# Patient Record
Sex: Female | Born: 1940
Health system: Southern US, Community
[De-identification: ages and names within clinical notes are randomized; demographics above are authoritative.]

## PROBLEM LIST (undated history)

## (undated) DIAGNOSIS — D649 Anemia, unspecified: Secondary | ICD-10-CM

## (undated) DIAGNOSIS — R0602 Shortness of breath: Secondary | ICD-10-CM

## (undated) DIAGNOSIS — M1990 Unspecified osteoarthritis, unspecified site: Secondary | ICD-10-CM

## (undated) DIAGNOSIS — I214 Non-ST elevation (NSTEMI) myocardial infarction: Secondary | ICD-10-CM

## (undated) DIAGNOSIS — I509 Heart failure, unspecified: Secondary | ICD-10-CM

## (undated) DIAGNOSIS — K029 Dental caries, unspecified: Secondary | ICD-10-CM

## (undated) DIAGNOSIS — I255 Ischemic cardiomyopathy: Secondary | ICD-10-CM

## (undated) DIAGNOSIS — I10 Essential (primary) hypertension: Secondary | ICD-10-CM

## (undated) DIAGNOSIS — I2510 Atherosclerotic heart disease of native coronary artery without angina pectoris: Secondary | ICD-10-CM

## (undated) HISTORY — PX: CARDIAC CATHETERIZATION: SHX172

---

## 2012-09-07 ENCOUNTER — Inpatient Hospital Stay (HOSPITAL_COMMUNITY)
Admission: EM | Admit: 2012-09-07 | Discharge: 2012-09-11 | DRG: 280 | Disposition: A | Discharge status: Home / Self Care | Payer: Medicare Other | Attending: Family Medicine | Admitting: Family Medicine

## 2012-09-07 ENCOUNTER — Emergency Department (HOSPITAL_COMMUNITY): Payer: Medicare Other

## 2012-09-07 ENCOUNTER — Emergency Department (INDEPENDENT_AMBULATORY_CARE_PROVIDER_SITE_OTHER)
Admission: EM | Admit: 2012-09-07 | Discharge: 2012-09-07 | Disposition: A | Discharge status: Nursing Home (Includes ICF) | Payer: Medicare Other | Source: Home / Self Care | Attending: Emergency Medicine | Admitting: Emergency Medicine

## 2012-09-07 ENCOUNTER — Encounter (HOSPITAL_COMMUNITY): Payer: Self-pay | Admitting: *Deleted

## 2012-09-07 DIAGNOSIS — R0789 Other chest pain: Secondary | ICD-10-CM

## 2012-09-07 DIAGNOSIS — R0602 Shortness of breath: Secondary | ICD-10-CM | POA: Diagnosis present

## 2012-09-07 DIAGNOSIS — F17200 Nicotine dependence, unspecified, uncomplicated: Secondary | ICD-10-CM | POA: Diagnosis present

## 2012-09-07 DIAGNOSIS — I2589 Other forms of chronic ischemic heart disease: Secondary | ICD-10-CM | POA: Diagnosis present

## 2012-09-07 DIAGNOSIS — R079 Chest pain, unspecified: Secondary | ICD-10-CM

## 2012-09-07 DIAGNOSIS — I259 Chronic ischemic heart disease, unspecified: Secondary | ICD-10-CM

## 2012-09-07 DIAGNOSIS — I5031 Acute diastolic (congestive) heart failure: Secondary | ICD-10-CM | POA: Diagnosis present

## 2012-09-07 DIAGNOSIS — R9431 Abnormal electrocardiogram [ECG] [EKG]: Secondary | ICD-10-CM | POA: Diagnosis present

## 2012-09-07 DIAGNOSIS — J811 Chronic pulmonary edema: Secondary | ICD-10-CM

## 2012-09-07 DIAGNOSIS — D649 Anemia, unspecified: Secondary | ICD-10-CM | POA: Diagnosis present

## 2012-09-07 DIAGNOSIS — I2510 Atherosclerotic heart disease of native coronary artery without angina pectoris: Secondary | ICD-10-CM

## 2012-09-07 DIAGNOSIS — I509 Heart failure, unspecified: Secondary | ICD-10-CM | POA: Diagnosis present

## 2012-09-07 DIAGNOSIS — I214 Non-ST elevation (NSTEMI) myocardial infarction: Principal | ICD-10-CM

## 2012-09-07 DIAGNOSIS — I5021 Acute systolic (congestive) heart failure: Secondary | ICD-10-CM

## 2012-09-07 HISTORY — DX: Anemia, unspecified: D64.9

## 2012-09-07 HISTORY — DX: Heart failure, unspecified: I50.9

## 2012-09-07 HISTORY — DX: Shortness of breath: R06.02

## 2012-09-07 HISTORY — DX: Non-ST elevation (NSTEMI) myocardial infarction: I21.4

## 2012-09-07 HISTORY — DX: Atherosclerotic heart disease of native coronary artery without angina pectoris: I25.10

## 2012-09-07 HISTORY — DX: Chest pain, unspecified: R07.9

## 2012-09-07 LAB — CBC WITH DIFFERENTIAL/PLATELET
Basophils Relative: 0 % (ref 0–1)
Eosinophils Absolute: 0.1 10*3/uL (ref 0.0–0.7)
Hemoglobin: 10.7 g/dL — ABNORMAL LOW (ref 12.0–15.0)
MCH: 27.6 pg (ref 26.0–34.0)
MCHC: 32.7 g/dL (ref 30.0–36.0)
Monocytes Relative: 7 % (ref 3–12)
Neutrophils Relative %: 42 % — ABNORMAL LOW (ref 43–77)

## 2012-09-07 LAB — BASIC METABOLIC PANEL
BUN: 13 mg/dL (ref 6–23)
Calcium: 9.3 mg/dL (ref 8.4–10.5)
Creatinine, Ser: 1.03 mg/dL (ref 0.50–1.10)
GFR calc Af Amer: 62 mL/min — ABNORMAL LOW (ref >90)
GFR calc non Af Amer: 54 mL/min — ABNORMAL LOW (ref >90)
Potassium: 5.3 mEq/L — ABNORMAL HIGH (ref 3.5–5.1)

## 2012-09-07 LAB — POCT I-STAT TROPONIN I

## 2012-09-07 LAB — RETICULOCYTES
RBC.: 4.02 MIL/uL (ref 3.87–5.11)
Retic Ct Pct: 1.4 % (ref 0.4–3.1)

## 2012-09-07 MED ORDER — ASPIRIN 81 MG PO CHEW
CHEWABLE_TABLET | ORAL | Status: AC
  Filled 2012-09-07: qty 4

## 2012-09-07 MED ORDER — NITROGLYCERIN 0.4 MG SL SUBL
0.4000 mg | SUBLINGUAL_TABLET | SUBLINGUAL | Status: AC | PRN
  Administered 2012-09-07: 0.4 mg via SUBLINGUAL

## 2012-09-07 MED ORDER — SODIUM CHLORIDE 0.9 % IV SOLN
INTRAVENOUS | Status: DC
  Administered 2012-09-07: 18:00:00 via INTRAVENOUS

## 2012-09-07 MED ORDER — FUROSEMIDE 10 MG/ML IJ SOLN
60.0000 mg | Freq: Once | INTRAMUSCULAR | Status: AC
  Administered 2012-09-07: 60 mg via INTRAVENOUS
  Filled 2012-09-07: qty 6

## 2012-09-07 MED ORDER — NITROGLYCERIN 0.4 MG SL SUBL
SUBLINGUAL_TABLET | SUBLINGUAL | Status: AC
  Filled 2012-09-07: qty 25

## 2012-09-07 MED ORDER — NITROGLYCERIN 2 % TD OINT
0.5000 [in_us] | TOPICAL_OINTMENT | Freq: Once | TRANSDERMAL | Status: AC
  Administered 2012-09-07: 0.5 [in_us] via TOPICAL
  Filled 2012-09-07: qty 1

## 2012-09-07 MED ORDER — ASPIRIN 81 MG PO CHEW
324.0000 mg | CHEWABLE_TABLET | Freq: Once | ORAL | Status: AC
  Administered 2012-09-07: 324 mg via ORAL

## 2012-09-07 MED ORDER — MORPHINE SULFATE 4 MG/ML IJ SOLN: 4.0000 mg | Freq: Once | INTRAMUSCULAR | Status: DC

## 2012-09-07 NOTE — ED Notes (Signed)
Pt placed in room and on monitor. Pt c/o shortness of breath. Per pt request 2 L [place] applied. Pt denies chest pain at this time. Pt alert and oriented. No distress noted,

## 2012-09-07 NOTE — ED Notes (Signed)
Pt ambulated to restroom without assistance. Steady gait noted.  

## 2012-09-07 NOTE — ED Provider Notes (Signed)
History     CSN: [id number]  Arrival date & time 09/07/12  4098   First MD Initiated Contact with Patient 09/07/12 1847      Chief Complaint  Patient presents with  . Palpitations  . Shortness of Breath    (Consider location/radiation/quality/duration/timing/severity/associated sxs/prior treatment) HPI PT states she is having SOB, worse with exertion and lying flat, for 1 week. States she is having mild chest tightness and has ha a non-productive cough. Seen at [facility] and given NTG x 1 and ASA and transferred for further workup. Pt states her chest tightness has improve. No fever or chills. Mild lower ext swelling  Past Medical History  Diagnosis Date  . Shortness of breath   . CHF (congestive heart failure)   . Anemia     History reviewed. No pertinent past surgical history.  History reviewed. No pertinent family history.  History  Substance Use Topics  . Smoking status: Current Every Day Smoker -- 0.5 packs/day for 20 years    Types: Cigarettes  . Smokeless tobacco: Not on file  . Alcohol Use: No    OB History    Grav Para Term Preterm Abortions TAB SAB Ect Mult Living                  Review of Systems  Constitutional: Negative for fever and chills.  Respiratory: Positive for cough, chest tightness and shortness of breath.   Cardiovascular: Positive for leg swelling. Negative for chest pain and palpitations.  Gastrointestinal: Negative for nausea, vomiting, abdominal pain and diarrhea.  Musculoskeletal: Negative for myalgias and back pain.  Skin: Negative for rash and wound.  Neurological: Negative for dizziness, weakness, light-headedness and numbness.  All other systems reviewed and are negative.    Allergies  Review of patient's allergies indicates no known allergies.  Home Medications  No current outpatient prescriptions on file.  BP 116/73  Pulse 88  Temp 98 F (36.7 C) (Oral)  Resp 18  Ht 5\' 1"  (1.549 m)  Wt 127 lb 8 oz (57.834 kg)  BMI 24.09  kg/m2  SpO2 100%  Physical Exam  Nursing note and vitals reviewed. Constitutional: She is oriented to person, place, and time. She appears well-developed and well-nourished. No distress.  HENT:  Head: Normocephalic and atraumatic.  Mouth/Throat: Oropharynx is clear and moist.  Eyes: EOM are normal. Pupils are equal, round, and reactive to light.  Neck: Normal range of motion. Neck supple.  Cardiovascular: Normal rate and regular rhythm.   Pulmonary/Chest: Effort normal. No respiratory distress. She has no wheezes. She has rales. She exhibits no tenderness.       bl rales   Abdominal: Soft. Bowel sounds are normal. There is no tenderness. There is no rebound and no guarding.  Musculoskeletal: Normal range of motion. She exhibits edema (1+ bl pitting edema). She exhibits no tenderness.  Neurological: She is alert and oriented to person, place, and time.  Skin: Skin is warm and dry. No rash noted. No erythema.  Psychiatric: She has a normal mood and affect. Her behavior is normal.    ED Course  Procedures (including critical care time)  Labs Reviewed  CBC WITH DIFFERENTIAL - Abnormal; Notable for the following:    WBC 3.8 (*)     Hemoglobin 10.7 (*)     HCT 32.7 (*)     Neutrophils Relative 42 (*)     Neutro Abs 1.6 (*)     Lymphocytes Relative 49 (*)     All  other components within normal limits  BASIC METABOLIC PANEL - Abnormal; Notable for the following:    Potassium 5.3 (*)     GFR calc non Af Amer 54 (*)     GFR calc Af Amer 62 (*)     All other components within normal limits  PRO B NATRIURETIC PEPTIDE - Abnormal; Notable for the following:    Pro B Natriuretic peptide (BNP) 4088.0 (*)     All other components within normal limits  VITAMIN B12 - Abnormal; Notable for the following:    Vitamin B-12 1086 (*)     All other components within normal limits  IRON AND TIBC - Abnormal; Notable for the following:    Saturation Ratios 19 (*)     All other components within  normal limits  BASIC METABOLIC PANEL - Abnormal; Notable for the following:    Potassium 2.9 (*)  DELTA CHECK NOTED   Glucose, Bld 108 (*)     GFR calc non Af Amer 57 (*)     GFR calc Af Amer 66 (*)     All other components within normal limits  BASIC METABOLIC PANEL - Abnormal; Notable for the following:    Glucose, Bld 105 (*)     GFR calc non Af Amer 50 (*)     GFR calc Af Amer 58 (*)     All other components within normal limits  POCT I-STAT TROPONIN I  FOLATE  FERRITIN  RETICULOCYTES  TROPONIN I  TROPONIN I  TROPONIN I  TSH   Dg Chest 2 View  09/07/2012  *RADIOLOGY REPORT*  Clinical Data: Palpitations, chest tightness, shortness of breath, cough  CHEST - 2 VIEW  Comparison: None  Findings: Enlargement cardiac silhouette. Minimal pulmonary vascular congestion. Calcified tortuous aorta. Right pleural effusion and basilar atelectasis. Perihilar interstitial infiltrates bilaterally favor edema over infection. No pneumothorax. Question osseous demineralization.  IMPRESSION: Enlargement of cardiac silhouette with pulmonary vascular congestion and diffuse bilateral interstitial infiltrates favor pulmonary edema over infection. Associated right pleural effusion and basilar atelectasis.   Original Report Authenticated By: Ulyses Southward, M.D.      1. Pulmonary edema   2. Chest tightness   3. Chest pain   4. CHF exacerbation   5. EKG abnormalities   6. Normochromic anemia   7. SOB (shortness of breath)      Date: 09/07/2012  Rate: 89  Rhythm: normal sinus rhythm  QRS Axis: normal  Intervals: QT prolonged  ST/T Wave abnormalities: nonspecific T wave changes  Conduction Disutrbances:none  Narrative Interpretation:   Old EKG Reviewed: none available    MDM   Pt states chest tightness and SOB has improved since given lasix       Loren Racer, MD 09/08/12 [phone]

## 2012-09-07 NOTE — ED Notes (Signed)
Patient complains of shortness of breath x 1 week. Patient states she can't lie down to sleep because she can't breath. States she has had some chills and chest congestion with tightness in chest. Denies fever, nausea, vomiting, diarrhea.

## 2012-09-07 NOTE — ED Notes (Addendum)
Pt states she has used the restroom 3 times and after this last time she feels a lot of relief of that chest pressure. Pt not wearing oxygen at this time, doesn't feel she needs it.

## 2012-09-07 NOTE — ED Notes (Signed)
EKG completed at urgent care at 1728

## 2012-09-07 NOTE — ED Provider Notes (Signed)
Chief Complaint  Patient presents with  . Shortness of Breath    History of Present Illness:   Faith Gray is a 71 year old female who has had a one-week history of difficulty breathing. She feels short of breath with exertion and also with lying flat at night. She has to sleep sitting up. She feels pressure in her chest, like an elephant sitting on her chest. She's had a slight cough productive of clear sputum. She has felt slightly chilled but had no fever. She denies a history of heart disease or chest pain. She's had some dizziness and irregular or rapid heartbeat at times. She is a smoker. She has no history of high blood pressure, although her blood pressure is elevated today. She has no history of diabetes or elevated cholesterol. She denies any ankle edema. There is no history of asthma, COPD, emphysema, chronic bronchitis.  Review of Systems:  Other than noted above, the patient denies any of the following symptoms. Systemic:  No fever, chills, sweats, or fatigue. ENT:  No nasal congestion, rhinorrhea, or sore throat. Pulmonary:  No cough, wheezing, shortness of breath, sputum production, hemoptysis. Cardiac:  No palpitations, rapid heartbeat, dizziness, presyncope or syncope. Skin:  No rash or itching. Ext:  No leg pain or swelling. Psych:  No anxiety or depression.   PMFSH:  Past medical history, family history, social history, meds, and allergies were reviewed and updated as needed. The patient denies any history of asthma, lung disease, heart disease, or anxiety.  Physical Exam:   Vital signs:  BP 154/102  Pulse 95  Temp 98.1 F (36.7 C) (Oral)  Resp 18  SpO2 98% Gen:  Alert, oriented, in no distress, skin warm and dry. Eye:  PERRL, lids and conjunctivas normal.  Sclera non-icteric. ENT:  Mucous membranes moist, pharynx clear. Neck:  Supple, no adenopathy or tenderness.  No JVD. Lungs:  There are scattered rales in both lower lung fields posteriorly.  No respiratory  distress. Heart:  Regular rhythm.  No gallops, murmers, clicks or rubs. Abdomen:  Soft, nontender, no organomegaly or mass.  Bowel sounds normal.  No pulsatile abdominal mass or bruit. Ext:  She has 1+ pitting ankle edema.  No calf tenderness and Homann's sign negative.  Pulses full and equal. Skin:  Warm and dry.  No rash.  EKG:   Date: 09/07/2012  Rate: 91  Rhythm: normal sinus rhythm and premature ventricular contractions (PVC)  QRS Axis: normal  Intervals: normal  ST/T Wave abnormalities: nonspecific T wave changes  Conduction Disutrbances:none  Narrative Interpretation: Sinus rhythm with occasional premature ventricular complexes, inferior and anterior infarctions, age undetermined and T wave abnormalities consistent with inferior and lateral ischemia.  Old EKG Reviewed: none available   Medications given in [facility]:  She was started on IV normal saline at 50 mL per hour, given aspirin 325 mg by mouth and nitroglycerin 0.4 mg sublingually. She tolerated this well without any immediate side effects.  Assessment:  The encounter diagnosis was Ischemic heart disease.   Plan:   1.  The following meds were prescribed:   New Prescriptions   No medications on file   2.  The patient was transferred to the emergency department by [facility] in stable condition.  Reuben Likes, MD 09/07/12 [phone]

## 2012-09-07 NOTE — ED Notes (Signed)
Pt is tx from Urgent Care. Pt reports palpitations, chest tightness, and sob over the last week. Reports last night was worst than normal. Pt with 20g(L)FA. EKG with T wave abnormality. Pt given 1 nitro approx 1 hour ago for chest tightness. At this time pt with no complaints. Pt is on 2L of O2 via [place].

## 2012-09-07 NOTE — H&P (Signed)
  Chief Complaint:  sob  HPI: 71 yo female healthy comes in with several days of sob and chest tightness from difficulty breathing with some mild le edema and trouble sleeping on her back at night.  No prev h/o cad/or chf.  No fevers.  Dry cough.  Has gotten iv lasix in the ED and has urinated over 5 times and feels much better.  No more chest tightness.    Review of Systems:  O/w neg  Past Medical History: none  Medications: Prior to Admission medications   Not on File  none  Allergies:  No Known Allergies  Social History:  reports that she has been smoking Cigarettes.  She has been smoking about .5 packs per day. She does not have any smokeless tobacco history on file. She reports that she does not drink alcohol or use illicit drugs.  Family History: neg  Physical Exam: Filed Vitals:   09/07/12 1837 09/07/12 2136  BP: 128/63 139/91  Pulse: 84 89  Temp: 98.4 F (36.9 C)   TempSrc: Oral   Resp: 17 16  SpO2: 98% 100%   General appearance: alert, cooperative and no distress Neck: no JVD and supple, symmetrical, trachea midline Lungs: clear to auscultation bilaterally Heart: regular rate and rhythm, S1, S2 normal, no murmur, click, rub or gallop Abdomen: soft, non-tender; bowel sounds normal; no masses,  no organomegaly Extremities: extremities normal, atraumatic, no cyanosis or edema Pulses: 2+ and symmetric Skin: Skin color, texture, turgor normal. No rashes or lesions Neurologic: Grossly normal    Labs on Admission:   [facility] 09/07/12 1950  NA 139  K 5.3*  CL 106  CO2 21  GLUCOSE 90  BUN 13  CREATININE 1.03  CALCIUM 9.3  MG --  PHOS --    Basename 09/07/12 1950  WBC 3.8*  NEUTROABS 1.6*  HGB 10.7*  HCT 32.7*  MCV 84.3  PLT 280    Radiological Exams on Admission: Dg Chest 2 View  09/07/2012  *RADIOLOGY REPORT*  Clinical Data: Palpitations, chest tightness, shortness of breath, cough  CHEST - 2 VIEW  Comparison: None  Findings: Enlargement  cardiac silhouette. Minimal pulmonary vascular congestion. Calcified tortuous aorta. Right pleural effusion and basilar atelectasis. Perihilar interstitial infiltrates bilaterally favor edema over infection. No pneumothorax. Question osseous demineralization.  IMPRESSION: Enlargement of cardiac silhouette with pulmonary vascular congestion and diffuse bilateral interstitial infiltrates favor pulmonary edema over infection. Associated right pleural effusion and basilar atelectasis.   Original Report Authenticated By: Ulyses Southward, M.D.     Assessment/Plan  71 yo female with new onset chf  Principal Problem:  *CHF exacerbation Active Problems:  SOB (shortness of breath)  Chest pain  EKG abnormalities  Normochromic anemia  ekg with tw in lateral leads.  vss and back to baseline already with lasix 60mg  iv in ED.  chf pathway.  Ck echo.  Cont iv lasix at 40 iv daily, may need less than that.  Ck anemia panel.  Set up with f/u with pcp or cards for new onset chf.  Further recs pending results of above.  Full code.  Asa.    Cordell Coke A 09/07/2012, 10:36 PM

## 2012-09-08 ENCOUNTER — Encounter (HOSPITAL_COMMUNITY): Payer: Self-pay | Admitting: *Deleted

## 2012-09-08 DIAGNOSIS — R0789 Other chest pain: Secondary | ICD-10-CM

## 2012-09-08 LAB — VITAMIN B12: Vitamin B-12: 1086 pg/mL — ABNORMAL HIGH (ref 211–911)

## 2012-09-08 LAB — BASIC METABOLIC PANEL
BUN: 13 mg/dL (ref 6–23)
CO2: 29 mEq/L (ref 19–32)
Calcium: 9.1 mg/dL (ref 8.4–10.5)
Calcium: 9.7 mg/dL (ref 8.4–10.5)
Chloride: 100 mEq/L (ref 96–112)
Creatinine, Ser: 0.98 mg/dL (ref 0.50–1.10)
Creatinine, Ser: 1.1 mg/dL (ref 0.50–1.10)
GFR calc Af Amer: 66 mL/min — ABNORMAL LOW (ref >90)
Glucose, Bld: 105 mg/dL — ABNORMAL HIGH (ref 70–99)
Sodium: 141 mEq/L (ref 135–145)

## 2012-09-08 LAB — TROPONIN I: Troponin I: <0.3 ng/mL (ref <0.30)

## 2012-09-08 LAB — IRON AND TIBC: Saturation Ratios: 19 % — ABNORMAL LOW (ref 20–55)

## 2012-09-08 LAB — FERRITIN: Ferritin: 170 ng/mL (ref 10–291)

## 2012-09-08 MED ORDER — POTASSIUM CHLORIDE CRYS ER 20 MEQ PO TBCR
60.0000 meq | EXTENDED_RELEASE_TABLET | Freq: Once | ORAL | Status: AC
  Administered 2012-09-08: 60 meq via ORAL
  Filled 2012-09-08: qty 3

## 2012-09-08 MED ORDER — ONDANSETRON HCL 4 MG/2ML IJ SOLN: 4.0000 mg | Freq: Four times a day (QID) | INTRAMUSCULAR | Status: DC | PRN

## 2012-09-08 MED ORDER — SODIUM CHLORIDE 0.9 % IJ SOLN: 3.0000 mL | INTRAMUSCULAR | Status: DC | PRN

## 2012-09-08 MED ORDER — FUROSEMIDE 10 MG/ML IJ SOLN
40.0000 mg | Freq: Once | INTRAMUSCULAR | Status: AC
  Administered 2012-09-08: 40 mg via INTRAVENOUS
  Filled 2012-09-08: qty 4

## 2012-09-08 MED ORDER — SODIUM CHLORIDE 0.9 % IJ SOLN
3.0000 mL | Freq: Two times a day (BID) | INTRAMUSCULAR | Status: DC
  Administered 2012-09-08 – 2012-09-11 (×8): 3 mL via INTRAVENOUS

## 2012-09-08 MED ORDER — NITROGLYCERIN 2 % TD OINT
0.5000 [in_us] | TOPICAL_OINTMENT | Freq: Three times a day (TID) | TRANSDERMAL | Status: DC
  Administered 2012-09-08 (×2): 0.5 [in_us] via TOPICAL
  Filled 2012-09-08: qty 30

## 2012-09-08 MED ORDER — ASPIRIN EC 81 MG PO TBEC
81.0000 mg | DELAYED_RELEASE_TABLET | Freq: Every day | ORAL | Status: DC
  Administered 2012-09-08 – 2012-09-11 (×3): 81 mg via ORAL
  Filled 2012-09-08 (×4): qty 1

## 2012-09-08 MED ORDER — FUROSEMIDE 10 MG/ML IJ SOLN
40.0000 mg | Freq: Every day | INTRAMUSCULAR | Status: DC
  Administered 2012-09-08: 40 mg via INTRAVENOUS
  Filled 2012-09-08 (×2): qty 4

## 2012-09-08 MED ORDER — POTASSIUM CHLORIDE CRYS ER 20 MEQ PO TBCR
40.0000 meq | EXTENDED_RELEASE_TABLET | Freq: Once | ORAL | Status: AC
  Administered 2012-09-08: 40 meq via ORAL
  Filled 2012-09-08: qty 2

## 2012-09-08 MED ORDER — ACETAMINOPHEN 325 MG PO TABS: 650.0000 mg | ORAL_TABLET | ORAL | Status: DC | PRN

## 2012-09-08 MED ORDER — SODIUM CHLORIDE 0.9 % IV SOLN: 250.0000 mL | INTRAVENOUS | Status: DC | PRN

## 2012-09-08 MED ORDER — POTASSIUM CHLORIDE CRYS ER 20 MEQ PO TBCR
40.0000 meq | EXTENDED_RELEASE_TABLET | Freq: Every day | ORAL | Status: DC
  Administered 2012-09-09 – 2012-09-11 (×3): 40 meq via ORAL
  Filled 2012-09-08 (×3): qty 2

## 2012-09-08 NOTE — Progress Notes (Signed)
[facility] PROGRESS NOTE  Faith Gray ZOX:[id number] DOB: 05-24-1941 DOA: 09/07/2012 PCP: Default, Provider, MD  Assessment/Plan:  *CHF exacerbation  Active Problems:  SOB (shortness of breath)  Chest pain  EKG abnormalities  Normochromic anemia   New onset heart failure in a previously healthy female.  Evaluate for etiology -  Troponins:  Neg so far -  TSH pending -  ANA, RPR, SPEP, ferritin if systolic heart failure -  Would need ischemic work up if systolic heart failure -  Continue telemetry to monitor for arrhythmias -  ECHO pending to evaluate for valvular abnl -  Additional lasix this afternoon -  Continue potassium supplementation  Normocytic anemia,  -  TSH, B12, folate, iron studies pending  DIET:  Heart healthy ACCESS:  PIV IVF:  None PROPH:  lovenox  Code Status:  Full code Family Communication: spoke with patient  Disposition Plan: pending further diuresis and evaluation for etiology of heart failure   Consultants:  none  Procedures:  ECHO pending  Antibiotics:  none   HPI/Subjective:  Patient states that her breathing is improved somewhat after lasix.  Her chest pressure has improved.  Denies nausea, vomiting, diarrhea, constipation, diarrhea.   Objective: Filed Vitals:   09/07/12 2255 09/08/12 0005 09/08/12 0010 09/08/12 0419  BP: 140/96 148/96  114/69  Pulse:  85  99  Temp:  98.2 F (36.8 C)  98.4 F (36.9 C)  TempSrc:  Oral  Oral  Resp:  18  19  Height:   5\' 1"  (1.549 m)   Weight:  57.834 kg (127 lb 8 oz)    SpO2:  100%  97%    Intake/Output Summary (Last 24 hours) at 09/08/12 1250 Last data filed at 09/08/12 1154  Gross per 24 hour  Intake    424 ml  Output   1750 ml  Net  -1326 ml   Filed Weights   09/08/12 0005  Weight: 57.834 kg (127 lb 8 oz)    Exam:   General:  Thin AAF, mild tachypnea and has to gasp for air after Faith Gray sentences  HEENT:  JVP to the preauricular area, MMM  Cardiovascular: RRR, 1/6  murmur at the LSB.  2+ pulses, warm extremities, < 2 sec CR  Respiratory:  Diminished at the bilateral bases  Abdomen: NABS, soft, nondistended, nontender, no organomegaly (no hepatomegaly)  MSK:  Trace ankle LEE  Data Reviewed: Basic Metabolic Panel:  Lab 09/08/12 8119 09/07/12 1950  NA 140 139  K 2.9* 5.3*  CL 101 106  CO2 27 21  GLUCOSE 108* 90  BUN 13 13  CREATININE 0.98 1.03  CALCIUM 9.1 9.3  MG -- --  PHOS -- --   Liver Function Tests: No results found for this basename: AST:5,ALT:5,ALKPHOS:5,BILITOT:5,PROT:5,ALBUMIN:5 in the last 168 hours No results found for this basename: LIPASE:5,AMYLASE:5 in the last 168 hours No results found for this basename: AMMONIA:5 in the last 168 hours CBC:  Lab 09/07/12 1950  WBC 3.8*  NEUTROABS 1.6*  HGB 10.7*  HCT 32.7*  MCV 84.3  PLT 280   Cardiac Enzymes:  Lab 09/08/12 0455 09/08/12 0035  CKTOTAL -- --  CKMB -- --  CKMBINDEX -- --  TROPONINI <0.30 <0.30   BNP (last 3 results)  Basename 09/07/12 1957  PROBNP 4088.0*   CBG: No results found for this basename: GLUCAP:5 in the last 168 hours  No results found for this or any previous visit (from the past 240 hour(s)).   Studies: Dg Chest 2  View  09/07/2012  *RADIOLOGY REPORT*  Clinical Data: Palpitations, chest tightness, shortness of breath, cough  CHEST - 2 VIEW  Comparison: None  Findings: Enlargement cardiac silhouette. Minimal pulmonary vascular congestion. Calcified tortuous aorta. Right pleural effusion and basilar atelectasis. Perihilar interstitial infiltrates bilaterally favor edema over infection. No pneumothorax. Question osseous demineralization.  IMPRESSION: Enlargement of cardiac silhouette with pulmonary vascular congestion and diffuse bilateral interstitial infiltrates favor pulmonary edema over infection. Associated right pleural effusion and basilar atelectasis.   Original Report Authenticated By: Ulyses Southward, M.D.     Scheduled Meds:   . aspirin EC   81 mg Oral Daily  . furosemide  40 mg Intravenous Daily  . nitroGLYCERIN  0.5 inch Topical Q8H  . sodium chloride  3 mL Intravenous Q12H   Continuous Infusions:   Principal Problem:  *CHF exacerbation Active Problems:  SOB (shortness of breath)  Chest pain  EKG abnormalities  Normochromic anemia    Time spent:  30    Deshundra Waller, [facility]  [facility] Pager [phone]. If 8PM-8AM, please contact night-coverage at [email or web address], password [facility] 09/08/2012, 12:50 PM  LOS: 1 day

## 2012-09-09 DIAGNOSIS — I5021 Acute systolic (congestive) heart failure: Secondary | ICD-10-CM

## 2012-09-09 DIAGNOSIS — R0789 Other chest pain: Secondary | ICD-10-CM

## 2012-09-09 HISTORY — DX: Acute systolic (congestive) heart failure: I50.21

## 2012-09-09 LAB — BASIC METABOLIC PANEL
BUN: 19 mg/dL (ref 6–23)
CO2: 25 mEq/L (ref 19–32)
GFR calc non Af Amer: 53 mL/min — ABNORMAL LOW (ref >90)
Glucose, Bld: 111 mg/dL — ABNORMAL HIGH (ref 70–99)
Potassium: 4 mEq/L (ref 3.5–5.1)

## 2012-09-09 MED ORDER — ENALAPRIL MALEATE 5 MG PO TABS
5.0000 mg | ORAL_TABLET | Freq: Two times a day (BID) | ORAL | Status: DC
  Administered 2012-09-09 – 2012-09-11 (×4): 5 mg via ORAL
  Filled 2012-09-09 (×5): qty 1

## 2012-09-09 MED ORDER — FUROSEMIDE 10 MG/ML IJ SOLN
40.0000 mg | Freq: Every day | INTRAMUSCULAR | Status: DC
  Administered 2012-09-09: 40 mg via INTRAVENOUS
  Filled 2012-09-09: qty 4

## 2012-09-09 MED ORDER — DIGOXIN 125 MCG PO TABS
0.1250 mg | ORAL_TABLET | Freq: Every day | ORAL | Status: DC
  Administered 2012-09-09 – 2012-09-11 (×3): 0.125 mg via ORAL
  Filled 2012-09-09 (×4): qty 1

## 2012-09-09 MED ORDER — FUROSEMIDE 10 MG/ML IJ SOLN: 40.0000 mg | Freq: Every day | INTRAMUSCULAR | Status: DC

## 2012-09-09 NOTE — Consult Note (Signed)
Advanced Heart Failure Team Consult Note  Referring Physician: Dr. Malachi Bonds Primary Physician: Dr Manson Passey (hasn't seen in 2 years) Primary Cardiologist:    Reason for Consultation: New onset HF  HPI:    Faith Gray is a delightful 71 y.o. 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  Over this time she also noted orthopnea/PND.  Minimal edema.  She went to the Urgent Care and was transferred to [facility] for acute HF exacerbation.  She was placed on IV diuretics with improvement of her symptoms (diuresed 3L).  She continues to feel fatigued but orthopnea improved.  On admit proBNP 4000, Cr 1.  CXR enlargement of cardiac silhouette with pulmonary vascular congestion and diffuse bilateral interstitial infiltrates.  Echo on 12/23 showed LVEF 25-30% with diffuse hypokinesis.  Mild MR, mildly dilated LA, PAPP 34 mmHg.  Small pericardial effusion.  EKG reviewed with inferolateral TWI (no prior EKG to review).   She denies h/o HTN, DM2, CAD.    She lives with her mom who is 66 years old and also has HF.  Review of Systems: [y] = yes, [ ]  = no   General: Weight gain [ ] ; Weight loss [ ] ; Anorexia [ ] ; Fatigue [ y]; Fever [ ] ; Chills [ ] ; Weakness [ ]   Cardiac: Chest pain/pressure [ ] ; Resting SOB [ ] ; Exertional SOB Cove.Etienne ]; Orthopnea Cove.Etienne ]; Pedal Edema [ ] ; Palpitations [ ] ; Syncope [ ] ; Presyncope [ ] ; Paroxysmal nocturnal dyspnea[ ]   Pulmonary: Cough [ ] ; Wheezing[ ] ; Hemoptysis[ ] ; Sputum [ ] ; Snoring [ ]   GI: Vomiting[ ] ; Dysphagia[ ] ; Melena[ ] ; Hematochezia [ ] ; Heartburn[ ] ; Abdominal pain [ ] ; Constipation [ ] ; Diarrhea [ ] ; BRBPR [ ]   GU: Hematuria[ ] ; Dysuria [ ] ; Nocturia[ ]   Vascular: Pain in legs with walking [ ] ; Pain in feet with lying flat [ ] ; Non-healing sores [ ] ; Stroke [ ] ; TIA [ ] ; Slurred speech [ ] ;  Neuro: Headaches[ ] ;  Vertigo[ ] ; Seizures[ ] ; Paresthesias[ ] ;Blurred vision [ ] ; Diplopia [ ] ; Vision changes [ ]   Ortho/Skin: Arthritis [ ] ; Joint pain [ ] ; Muscle pain [ ] ; Joint swelling [ ] ; Back Pain [ ] ; Rash [ ]   Psych: Depression[ ] ; Anxiety[ ]   Heme: Bleeding problems [ ] ; Clotting disorders [ ] ; Anemia [ ]   Endocrine: Diabetes [ ] ; Thyroid dysfunction[ ]   Home Medications Prior to Admission medications   Not on File    Past Medical History: Past Medical History  Diagnosis Date  . Shortness of breath   . CHF (congestive heart failure)   . Anemia     Past Surgical History: History reviewed. No pertinent past surgical history.  Family History: History reviewed. No pertinent family history. Mother 62 with HF  Social History: History   Social History  . Marital Status: Single    Spouse Name: N/A    Number of Children: N/A  . Years of Education: N/A   Social History Main Topics  . Smoking status: Current Every Day Smoker -- 0.5 packs/day for 20 years    Types: Cigarettes  . Smokeless tobacco: None  . Alcohol Use: No  . Drug Use: No  . Sexually Active: No   Other Topics Concern  . None   Social History Narrative  . None  2 daughters 53 and 37. Healthy.  Allergies:  No Known Allergies  Objective:    Vital Signs:   Temp:  [97.6 F (36.4 C)-98.3 F (36.8 C)] 98.3 F (36.8 C) (12/23 1345) Pulse Rate:  [84-93] 84  (12/23 1345) Resp:  [18] 18  (12/23 1345) BP: (109-121)/(68-76) 120/76 mmHg (12/23 1345) SpO2:  [96 %-100 %] 96 % (12/23 1345) Weight:  [55.792 kg (123 lb)] 55.792 kg (123 lb) (12/23 0413) Last BM Date: 09/08/12  Weight change: Filed Weights   09/08/12 0005 09/09/12 0413  Weight: 57.834 kg (127 lb 8 oz) 55.792 kg (123 lb)    Intake/Output:   Intake/Output Summary (Last 24 hours) at 09/09/12 1730 Last data filed at 09/09/12 1300  Gross per 24 hour  Intake    480 ml  Output    800 ml  Net   -320 ml     Physical Exam: General:  Well appearing. No  resp difficulty HEENT: normal Neck: supple. JVP 8-9. Carotids 2+ bilat; no bruits. No lymphadenopathy or thryomegaly appreciated. Cor: PMI nondisplaced. Regular rate & rhythm. 2/6 MR.  +S3. Lungs: clear Abdomen: soft, nontender, nondistended. No hepatosplenomegaly. No bruits or masses. Good bowel sounds. Extremities: no cyanosis, clubbing, rash, edema Neuro: alert & orientedx3, cranial nerves grossly intact. moves all 4 extremities w/o difficulty. Affect pleasant  Telemetry:   Labs: Basic Metabolic Panel:  Lab 09/09/12 4098 09/08/12 1325 09/08/12 0455 09/07/12 1950  NA 139 141 140 139  K 4.0 4.2 2.9* 5.3*  CL 103 100 101 106  CO2 25 29 27 21   GLUCOSE 111* 105* 108* 90  BUN 19 13 13 13   CREATININE 1.05 1.10 0.98 1.03  CALCIUM 9.1 9.7 9.1 --  MG 2.1 -- -- --  PHOS -- -- -- --    Liver Function Tests: No results found for this basename: AST:5,ALT:5,ALKPHOS:5,BILITOT:5,PROT:5,ALBUMIN:5 in the last 168 hours No results found for this basename: LIPASE:5,AMYLASE:5 in the last 168 hours No results found for this basename: AMMONIA:3 in the last 168 hours  CBC:  Lab 09/07/12 1950  WBC 3.8*  NEUTROABS 1.6*  HGB 10.7*  HCT 32.7*  MCV 84.3  PLT 280    Cardiac Enzymes:  Lab 09/08/12 1222 09/08/12 0455 09/08/12 0035  CKTOTAL -- -- --  CKMB -- -- --  CKMBINDEX -- -- --  TROPONINI <0.30 <0.30 <0.30    BNP: BNP (last 3 results)  Basename 09/07/12 1957  PROBNP 4088.0*    CBG: No results found for this basename: GLUCAP:5 in the last 168 hours  Coagulation Studies: No results found for this basename: LABPROT:5,INR:5 in the last 72 hours  Other results: EKG: SR with inferolateral TWI and ? Inferior Qs  Imaging: Dg Chest 2 View  09/07/2012  *RADIOLOGY REPORT*  Clinical Data: Palpitations, chest tightness, shortness of breath, cough  CHEST - 2 VIEW  Comparison: None  Findings: Enlargement cardiac silhouette. Minimal pulmonary vascular congestion. Calcified tortuous  aorta. Right pleural effusion and basilar atelectasis. Perihilar interstitial infiltrates bilaterally favor edema over infection. No pneumothorax. Question osseous demineralization.  IMPRESSION: Enlargement of cardiac silhouette with pulmonary vascular congestion and diffuse bilateral interstitial infiltrates favor pulmonary edema over infection. Associated right pleural effusion and basilar atelectasis.   Original Report Authenticated By: Ulyses Southward, M.D.      Medications:     Current Medications:    . aspirin EC  81 mg Oral Daily  . furosemide  40 mg Intravenous Daily  . potassium chloride  40 mEq Oral Daily  . sodium chloride  3 mL  Intravenous Q12H    Infusions:     Assessment:   1. Acute systolic heart failure, EF25-30% 2. Anemia 3. Tobacco use  Plan/Discussion:     Ms. Mcilvain has new onset systolic HF of unclear etiology. Symptoms much improved after diuresis. She will need R and L heart cath to further evaluate however she is quite hesitant about this and wants to think about it some more. We have suggested she watch the informational channel about cardiac cath. She will talk to her family and decide tomorrow.  In meantime, would continue IV lasix. Start enalapril 5mg  bid and digoxin 0.125 daily. No b-blocker just yet.We will follow.     [name] 5:36 PM     Length of Stay: 2

## 2012-09-09 NOTE — Progress Notes (Addendum)
[facility] PROGRESS NOTE  Faith Gray ZOX:[id number] DOB: 1941/04/07 DOA: 09/07/2012 PCP: Default, Provider, MD  Assessment/Plan:  *CHF exacerbation  Active Problems:  SOB (shortness of breath)  Chest pain  EKG abnormalities  Normochromic anemia   New onset heart failure in a previously healthy female.  Evaluate for etiology -  Troponins:  Neg -  TSH 1.8 -  ANA, RPR, SPEP, if systolic heart failure (ferritin wnl) -  Would need ischemic work up if systolic heart failure -  Continue telemetry:  PVCs -  ECHO pending to evaluate for valvular abnl -  Additional lasix this afternoon -  Continue potassium supplementation  Normocytic anemia,  -  TSH 1.8, B12 1086, folate 16.8, ferritin 170  DIET:  Heart healthy ACCESS:  PIV IVF:  None PROPH:  lovenox  Code Status:  Full code Family Communication: spoke with patient  Disposition Plan: pending further diuresis and evaluation for etiology of heart failure   Consultants:  none  Procedures:  ECHO pending  Antibiotics:  none   HPI/Subjective:  Patient states that her breathing and chest pressure are improving.  Still has some fatigure, shortness of breath when moving around the room.  Denies nausea, vomiting, diarrhea, constipation, diarrhea.   Objective: Filed Vitals:   09/08/12 0419 09/08/12 1319 09/08/12 1959 09/09/12 0413  BP: 114/69 116/73 109/68 121/70  Pulse: 99 88 88 93  Temp: 98.4 F (36.9 C) 98 F (36.7 C) 98 F (36.7 C) 97.6 F (36.4 C)  TempSrc: Oral Oral Oral Oral  Resp: 19 18 18 18   Height:      Weight:    55.792 kg (123 lb)  SpO2: 97% 100% 98% 100%    Intake/Output Summary (Last 24 hours) at 09/09/12 1241 Last data filed at 09/09/12 0900  Gross per 24 hour  Intake    244 ml  Output   2000 ml  Net  -1756 ml   Filed Weights   09/08/12 0005 09/09/12 0413  Weight: 57.834 kg (127 lb 8 oz) 55.792 kg (123 lb)    Exam:   General:  Thin AAF, no acute distress  HEENT:  JVP to the  angle of the mandible, MMM  Cardiovascular: RRR, 1/6 murmur at the LSB.  2+ pulses, warm extremities, < 2 sec CR  Respiratory:  Diminished at the bilateral bases  Abdomen: NABS, soft, nondistended, nontender, can palpate liver edge today  MSK:  Trace ankle LEE  Data Reviewed: Basic Metabolic Panel:  Lab 09/09/12 8119 09/08/12 1325 09/08/12 0455 09/07/12 1950  NA 139 141 140 139  K 4.0 4.2 2.9* 5.3*  CL 103 100 101 106  CO2 25 29 27 21   GLUCOSE 111* 105* 108* 90  BUN 19 13 13 13   CREATININE 1.05 1.10 0.98 1.03  CALCIUM 9.1 9.7 9.1 9.3  MG 2.1 -- -- --  PHOS -- -- -- --   Liver Function Tests: No results found for this basename: AST:5,ALT:5,ALKPHOS:5,BILITOT:5,PROT:5,ALBUMIN:5 in the last 168 hours No results found for this basename: LIPASE:5,AMYLASE:5 in the last 168 hours No results found for this basename: AMMONIA:5 in the last 168 hours CBC:  Lab 09/07/12 1950  WBC 3.8*  NEUTROABS 1.6*  HGB 10.7*  HCT 32.7*  MCV 84.3  PLT 280   Cardiac Enzymes:  Lab 09/08/12 1222 09/08/12 0455 09/08/12 0035  CKTOTAL -- -- --  CKMB -- -- --  CKMBINDEX -- -- --  TROPONINI <0.30 <0.30 <0.30   BNP (last 3 results)  Basename 09/07/12 1957  PROBNP 4088.0*   CBG: No results found for this basename: GLUCAP:5 in the last 168 hours  No results found for this or any previous visit (from the past 240 hour(s)).   Studies: Dg Chest 2 View  09/07/2012  *RADIOLOGY REPORT*  Clinical Data: Palpitations, chest tightness, shortness of breath, cough  CHEST - 2 VIEW  Comparison: None  Findings: Enlargement cardiac silhouette. Minimal pulmonary vascular congestion. Calcified tortuous aorta. Right pleural effusion and basilar atelectasis. Perihilar interstitial infiltrates bilaterally favor edema over infection. No pneumothorax. Question osseous demineralization.  IMPRESSION: Enlargement of cardiac silhouette with pulmonary vascular congestion and diffuse bilateral interstitial infiltrates favor  pulmonary edema over infection. Associated right pleural effusion and basilar atelectasis.   Original Report Authenticated By: Ulyses Southward, M.D.     Scheduled Meds:    . aspirin EC  81 mg Oral Daily  . furosemide  40 mg Intravenous Daily  . potassium chloride  40 mEq Oral Daily  . sodium chloride  3 mL Intravenous Q12H   Continuous Infusions:   Principal Problem:  *CHF exacerbation Active Problems:  SOB (shortness of breath)  Chest pain  EKG abnormalities  Normochromic anemia    Time spent:  30    Danthony Kendrix, [facility]  [facility] Pager [phone]. If 8PM-8AM, please contact night-coverage at [email or web address], password [facility] 09/09/2012, 12:41 PM  LOS: 2 days

## 2012-09-09 NOTE — Progress Notes (Signed)
Gave pt HF packet and showed pt HF video. Educated pt about weighing herself daily and new medications. Encouraged pt to ask questions and will continue education on HF with pt.

## 2012-09-09 NOTE — Progress Notes (Signed)
*  PRELIMINARY RESULTS* Echocardiogram 2D Echocardiogram has been performed.  Jeryl Columbia 09/09/2012, 11:38 AM

## 2012-09-09 NOTE — Care Management Note (Signed)
    Page 1 of 1   09/09/2012     2:01:11 PM   CARE MANAGEMENT NOTE 09/09/2012  Patient:  Faith Gray, Faith Gray   Account Number:  [id number]  Date Initiated:  09/09/2012  Documentation initiated by:  Avie Arenas  Subjective/Objective Assessment:   New CHF - on IV lasix.  Lives with daughter for now - moving into house next door.     Action/Plan:   Anticipated DC Date:  09/10/2012   Anticipated DC Plan:  HOME/SELF CARE      DC Planning Services  CM consult      Choice offered to / List presented to:             Status of service:  In process, will continue to follow Medicare Important Message given?   (If response is "NO", the following Medicare IM given date fields will be blank) Date Medicare IM given:   Date Additional Medicare IM given:    Discharge Disposition:    Per UR Regulation:  Reviewed for med. necessity/level of care/duration of stay  If discussed at Long Length of Stay Meetings, dates discussed:    Comments:   09-09-12 2pm Avie Arenas, RNBSN [phone] PCP is Dr. Manson Passey - but moved out her area so is interested in finding new one.  Given [facility] list of  physicians so she can choose one of them and be followed by [facility] program.

## 2012-09-10 ENCOUNTER — Encounter (HOSPITAL_COMMUNITY)
Admission: EM | Disposition: A | Discharge status: Home / Self Care | Payer: Self-pay | Source: Home / Self Care | Attending: Internal Medicine

## 2012-09-10 DIAGNOSIS — I5021 Acute systolic (congestive) heart failure: Secondary | ICD-10-CM

## 2012-09-10 DIAGNOSIS — I2510 Atherosclerotic heart disease of native coronary artery without angina pectoris: Secondary | ICD-10-CM

## 2012-09-10 DIAGNOSIS — I509 Heart failure, unspecified: Secondary | ICD-10-CM

## 2012-09-10 HISTORY — PX: LEFT AND RIGHT HEART CATHETERIZATION WITH CORONARY ANGIOGRAM: SHX5449

## 2012-09-10 LAB — BASIC METABOLIC PANEL
BUN: 17 mg/dL (ref 6–23)
CO2: 26 mEq/L (ref 19–32)
GFR calc non Af Amer: 53 mL/min — ABNORMAL LOW (ref >90)
Glucose, Bld: 97 mg/dL (ref 70–99)
Potassium: 4.3 mEq/L (ref 3.5–5.1)

## 2012-09-10 LAB — PROTIME-INR
INR: 1 (ref 0.00–1.49)
Prothrombin Time: 13.1 seconds (ref 11.6–15.2)

## 2012-09-10 LAB — POCT I-STAT 3, VENOUS BLOOD GAS (G3P V)
Acid-base deficit: 4 mmol/L — ABNORMAL HIGH (ref 0.0–2.0)
Bicarbonate: 21 mEq/L (ref 20.0–24.0)
O2 Saturation: 58 %
O2 Saturation: 63 %
TCO2: 24 mmol/L (ref 0–100)
pCO2, Ven: 41.3 mmHg — ABNORMAL LOW (ref 45.0–50.0)
pO2, Ven: 34 mmHg (ref 30.0–45.0)

## 2012-09-10 LAB — HIV ANTIBODY (ROUTINE TESTING W REFLEX): HIV: NONREACTIVE

## 2012-09-10 LAB — RPR: RPR Ser Ql: NONREACTIVE

## 2012-09-10 LAB — POCT I-STAT 3, ART BLOOD GAS (G3+)
Acid-base deficit: 1 mmol/L (ref 0.0–2.0)
Bicarbonate: 23.2 mEq/L (ref 20.0–24.0)
TCO2: 24 mmol/L (ref 0–100)
pH, Arterial: 7.444 (ref 7.350–7.450)
pO2, Arterial: 59 mmHg — ABNORMAL LOW (ref 80.0–100.0)

## 2012-09-10 LAB — CREATININE, SERUM: Creatinine, Ser: 0.99 mg/dL (ref 0.50–1.10)

## 2012-09-10 SURGERY — LEFT AND RIGHT HEART CATHETERIZATION WITH CORONARY ANGIOGRAM
Anesthesia: LOCAL

## 2012-09-10 MED ORDER — MIDAZOLAM HCL 2 MG/2ML IJ SOLN
INTRAMUSCULAR | Status: AC
  Filled 2012-09-10: qty 2

## 2012-09-10 MED ORDER — LIDOCAINE HCL (PF) 1 % IJ SOLN
INTRAMUSCULAR | Status: AC
  Filled 2012-09-10: qty 30

## 2012-09-10 MED ORDER — COUMADIN BOOK
Freq: Once | Status: AC
  Administered 2012-09-11: 08:00:00
  Filled 2012-09-10: qty 1

## 2012-09-10 MED ORDER — ACETAMINOPHEN 325 MG PO TABS: 650.0000 mg | ORAL_TABLET | ORAL | Status: DC | PRN

## 2012-09-10 MED ORDER — FUROSEMIDE 40 MG PO TABS
40.0000 mg | ORAL_TABLET | Freq: Every day | ORAL | Status: DC
  Administered 2012-09-11: 40 mg via ORAL
  Filled 2012-09-10: qty 1

## 2012-09-10 MED ORDER — WARFARIN - PHARMACIST DOSING INPATIENT
Freq: Every day | Status: DC
  Administered 2012-09-10: 18:00:00

## 2012-09-10 MED ORDER — SODIUM CHLORIDE 0.9 % IV SOLN
INTRAVENOUS | Status: AC
  Administered 2012-09-10: 75 mL/h via INTRAVENOUS

## 2012-09-10 MED ORDER — HEPARIN (PORCINE) IN NACL 2-0.9 UNIT/ML-% IJ SOLN
INTRAMUSCULAR | Status: AC
  Filled 2012-09-10: qty 1000

## 2012-09-10 MED ORDER — FENTANYL CITRATE 0.05 MG/ML IJ SOLN
INTRAMUSCULAR | Status: AC
  Filled 2012-09-10: qty 2

## 2012-09-10 MED ORDER — ATORVASTATIN CALCIUM 40 MG PO TABS
40.0000 mg | ORAL_TABLET | Freq: Every day | ORAL | Status: DC
  Administered 2012-09-10: 40 mg via ORAL
  Filled 2012-09-10 (×2): qty 1

## 2012-09-10 MED ORDER — SODIUM CHLORIDE 0.9 % IJ SOLN
3.0000 mL | Freq: Two times a day (BID) | INTRAMUSCULAR | Status: DC
  Administered 2012-09-10: 3 mL via INTRAVENOUS

## 2012-09-10 MED ORDER — SODIUM CHLORIDE 0.9 % IV SOLN: 250.0000 mL | INTRAVENOUS | Status: DC | PRN

## 2012-09-10 MED ORDER — WARFARIN SODIUM 6 MG PO TABS
6.0000 mg | ORAL_TABLET | Freq: Once | ORAL | Status: AC
  Administered 2012-09-10: 6 mg via ORAL
  Filled 2012-09-10: qty 1

## 2012-09-10 MED ORDER — ENOXAPARIN SODIUM 40 MG/0.4ML ~~LOC~~ SOLN
40.0000 mg | SUBCUTANEOUS | Status: DC
  Administered 2012-09-10: 40 mg via SUBCUTANEOUS
  Filled 2012-09-10 (×2): qty 0.4

## 2012-09-10 MED ORDER — NITROGLYCERIN 0.2 MG/ML ON CALL CATH LAB
INTRAVENOUS | Status: AC
  Filled 2012-09-10: qty 1

## 2012-09-10 MED ORDER — CARVEDILOL 3.125 MG PO TABS
3.1250 mg | ORAL_TABLET | Freq: Two times a day (BID) | ORAL | Status: DC
  Administered 2012-09-10 – 2012-09-11 (×2): 3.125 mg via ORAL
  Filled 2012-09-10 (×4): qty 1

## 2012-09-10 MED ORDER — WARFARIN VIDEO
Freq: Once | Status: AC
  Administered 2012-09-11: 08:00:00

## 2012-09-10 MED ORDER — ONDANSETRON HCL 4 MG/2ML IJ SOLN: 4.0000 mg | Freq: Four times a day (QID) | INTRAMUSCULAR | Status: DC | PRN

## 2012-09-10 MED ORDER — SODIUM CHLORIDE 0.9 % IJ SOLN: 3.0000 mL | INTRAMUSCULAR | Status: DC | PRN

## 2012-09-10 MED ORDER — ASPIRIN 81 MG PO CHEW
324.0000 mg | CHEWABLE_TABLET | ORAL | Status: DC
  Filled 2012-09-10: qty 4

## 2012-09-10 NOTE — Progress Notes (Signed)
[facility] PROGRESS NOTE  Faith Gray NWG:[id number] DOB: 04-12-1941 DOA: 09/07/2012 PCP: Default, Provider, MD  Assessment/Plan:  *CHF exacerbation  Active Problems:  SOB (shortness of breath)  Chest pain  EKG abnormalities  Normochromic anemia   Patient presented with new onset heart failure.  ECHO demonstrated new onset systolic hear failure with wall motion abnormality.  Underwent cath on 12/24 which demonstrated mild diffuse nonobstructive plaques in the LAD and RCA, 30% stenosis on ostial left circ with evidence of plaque rupture in the distal RCA with recannulization.  No stent or angioplasty required.  LV gram demonstrated EF of 25-30% with large aneurysm at the base to mid inferior wall.  DX = Ischemic cardiomyopathy.  Troponins were negative, telemetry was unremarkable.  Other causes of heart failure were considered:  TSH 1.8, ANA pending, HIV neg, RPR NR, and SPEP pending.   -  F/u ANA  -  F/u SPEP -  Patient started on coumadin on 12/24 due to aneurysmal dilation of the LV per Dr. Gala Romney  -  ASA, BB, ACEI and digoxin added -  Transition to oral lasix tomorrow in anticipation of discharge -  Consider spironolactone?  -  Can follow up with cardiology for further evaluation and consideration of ICD as outpatient  Normocytic anemia:   TSH 1.8, B12 1086, folate 16.8, ferritin 170.  SPEP pending.  Follow up with PCP.   -  F/u SPEP  DIET:  Heart healthy ACCESS:  PIV IVF:  None PROPH:  Lovenox, coumadin   Code Status:  Full code Family Communication: spoke with patient  Disposition Plan:  Tomorrow to home with cardiology follow up.  Home health HF management.     Consultants:  none  Procedures:  ECHO pending  Antibiotics:  none   HPI/Subjective:  Still has some fatigure, shortness of breath when moving around the room, but otherwise improved.  Catheterization went well this afternoon.  Denies nausea, vomiting, diarrhea, constipation, diarrhea.    Objective: Filed Vitals:   09/09/12 2013 09/09/12 2228 09/10/12 0513 09/10/12 1412  BP: 140/81 122/82 111/59 120/70  Pulse: 91 93 89 73  Temp: 98 F (36.7 C)  98.4 F (36.9 C) 97.5 F (36.4 C)  TempSrc: Oral  Oral Oral  Resp: 18   16  Height:      Weight:   55.7 kg (122 lb 12.7 oz)   SpO2: 100%  100% 99%    Intake/Output Summary (Last 24 hours) at 09/10/12 1623 Last data filed at 09/09/12 2229  Gross per 24 hour  Intake    243 ml  Output   1100 ml  Net   -857 ml   Filed Weights   09/08/12 0005 09/09/12 0413 09/10/12 0513  Weight: 57.834 kg (127 lb 8 oz) 55.792 kg (123 lb) 55.7 kg (122 lb 12.7 oz)    Exam:   General:  Thin AAF, no acute distress  HEENT:  JVP to below angle of the mandible  Cardiovascular: RRR, 2?6 murmur at the LSB.  2+ pulses, warm extremities, < 2 sec CR  Respiratory:  CTAB  Abdomen: NABS, soft, nondistended, nontender, no organomegaly  MSK:  Trace ankle LEE  Data Reviewed: Basic Metabolic Panel:  Lab 09/10/12 5784 09/09/12 0613 09/08/12 1325 09/08/12 0455 09/07/12 1950  NA 138 139 141 140 139  K 4.3 4.0 4.2 2.9* 5.3*  CL 101 103 100 101 106  CO2 26 25 29 27 21   GLUCOSE 97 111* 105* 108* 90  BUN 17 19 13  13 13  CREATININE 1.04 1.05 1.10 0.98 1.03  CALCIUM 9.4 9.1 9.7 9.1 9.3  MG -- 2.1 -- -- --  PHOS -- -- -- -- --   Liver Function Tests: No results found for this basename: AST:5,ALT:5,ALKPHOS:5,BILITOT:5,PROT:5,ALBUMIN:5 in the last 168 hours No results found for this basename: LIPASE:5,AMYLASE:5 in the last 168 hours No results found for this basename: AMMONIA:5 in the last 168 hours CBC:  Lab 09/07/12 1950  WBC 3.8*  NEUTROABS 1.6*  HGB 10.7*  HCT 32.7*  MCV 84.3  PLT 280   Cardiac Enzymes:  Lab 09/08/12 1222 09/08/12 0455 09/08/12 0035  CKTOTAL -- -- --  CKMB -- -- --  CKMBINDEX -- -- --  TROPONINI <0.30 <0.30 <0.30   BNP (last 3 results)  Basename 09/07/12 1957  PROBNP 4088.0*   CBG: No results found for  this basename: GLUCAP:5 in the last 168 hours  No results found for this or any previous visit (from the past 240 hour(s)).   Studies: No results found.  Scheduled Meds:    . aspirin EC  81 mg Oral Daily  . atorvastatin  40 mg Oral q1800  . carvedilol  3.125 mg Oral BID WC  . coumadin book   Does not apply Once  . digoxin  0.125 mg Oral Daily  . enalapril  5 mg Oral BID  . enoxaparin (LOVENOX) injection  40 mg Subcutaneous Q24H  . furosemide  40 mg Intravenous Daily  . potassium chloride  40 mEq Oral Daily  . sodium chloride  3 mL Intravenous Q12H  . warfarin  6 mg Oral ONCE-1800  . warfarin   Does not apply Once  . Warfarin - Pharmacist Dosing Inpatient   Does not apply q1800   Continuous Infusions:    . sodium chloride 75 mL/hr (09/10/12 1415)    Principal Problem:  *CHF exacerbation Active Problems:  SOB (shortness of breath)  Chest pain  EKG abnormalities  Normochromic anemia  Acute systolic heart failure  Chest tightness    Time spent:  30    Faith Gray, [facility]  [facility] Pager [phone]. If 8PM-8AM, please contact night-coverage at [email or web address], password [facility] 09/10/2012, 4:23 PM  LOS: 3 days

## 2012-09-10 NOTE — Progress Notes (Signed)
Pt's daughter called around 9:30 with questions and concerns about the possibility of a cardiac cath tomorrow. Pt appears anxious and has also voiced concerns this evening. Spoke with daughter extensively about pt's progress and educated her on caths. Suggested she come in tomorrow am to speak with MD about risks and benefits of the procedure. Pt aware daughter called and was fine with information being passed along. Pt was told everything that was said to daughter and also given education on procedure. Pt doing well, VSS. Will continue to monitor.

## 2012-09-10 NOTE — Progress Notes (Signed)
ANTICOAGULATION CONSULT NOTE - Initial Consult  Pharmacy Consult for coumadin Indication: large inferior wall aneurysm, coumadin to prevent development LV clot  No Known Allergies  Patient Measurements: Height: 5\' 1"  (154.9 cm) Weight: 122 lb 12.7 oz (55.7 kg) IBW/kg (Calculated) : 47.8    Vital Signs: Temp: 98.4 F (36.9 C) (12/24 0513) Temp src: Oral (12/24 0513) BP: 111/59 mmHg (12/24 0513) Pulse Rate: 89  (12/24 0513)  Labs:  Alvira Philips 09/10/12 0828 09/10/12 0505 09/09/12 7829 09/08/12 1325 09/08/12 1222 09/08/12 0455 09/08/12 0035 09/07/12 1950  HGB -- -- -- -- -- -- -- 10.7*  HCT -- -- -- -- -- -- -- 32.7*  PLT -- -- -- -- -- -- -- 280  APTT -- -- -- -- -- -- -- --  LABPROT 13.1 -- -- -- -- -- -- --  INR 1.00 -- -- -- -- -- -- --  HEPARINUNFRC -- -- -- -- -- -- -- --  CREATININE -- 1.04 1.05 1.10 -- -- -- --  CKTOTAL -- -- -- -- -- -- -- --  CKMB -- -- -- -- -- -- -- --  TROPONINI -- -- -- -- <0.30 <0.30 <0.30 --    Estimated Creatinine Clearance: 38 ml/min (by C-G formula based on Cr of 1.04).   Medical History: Past Medical History  Diagnosis Date  . Shortness of breath   . CHF (congestive heart failure)   . Anemia     Medications:  No prescriptions prior to admission    Assessment: 71 yo lady to start coumadin for large inferior wall aneurysm.  Baseline INR 1.0  Goal of Therapy:  INR 2-3 Monitor platelets by anticoagulation protocol: Yes   Plan:  Coumadin 6 mg po today. Check daily PT/INR Monitor for s&s bleeding. Coumadin book, video.  Talbert Cage Poteet 09/10/2012,2:08 PM

## 2012-09-10 NOTE — Progress Notes (Signed)
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 Over this time she also noted orthopnea/PND. Minimal edema.  She was found to have acute systolic HF, EF 25-305%.   Echo on 12/23 showed LVEF 25-30% with diffuse hypokinesis. Mild MR, mildly dilated LA, PAPP 34 mmHg. Small pericardial effusion. EKG reviewed with inferolateral TWI (no prior EKG to review).   Denies orthopnea/PND.  No edema.  Anxious about cath.    Objective:   Weight Range:  Vital Signs:   Temp:  [98 F (36.7 C)-98.4 F (36.9 C)] 98.4 F (36.9 C) (12/24 0513) Pulse Rate:  [84-93] 89  (12/24 0513) Resp:  [18] 18  (12/23 2013) BP: (111-140)/(59-82) 111/59 mmHg (12/24 0513) SpO2:  [96 %-100 %] 100 % (12/24 0513) Weight:  [55.7 kg (122 lb 12.7 oz)] 55.7 kg (122 lb 12.7 oz) (12/24 0513) Last BM Date: 09/08/12  Weight change: Filed Weights   09/08/12 0005 09/09/12 0413 09/10/12 0513  Weight: 57.834 kg (127 lb 8 oz) 55.792 kg (123 lb) 55.7 kg (122 lb 12.7 oz)    Intake/Output:   Intake/Output Summary (Last 24 hours) at 09/10/12 0807 Last data filed at 09/09/12 2229  Gross per 24 hour  Intake    723 ml  Output   1400 ml  Net   -677 ml     Physical Exam: General:  Well appearing. No resp difficulty HEENT: normal Neck: supple. JVP 6-7. Carotids 2+ bilat; no bruits. No lymphadenopathy or thryomegaly appreciated. Cor: PMI nondisplaced. Regular rate & rhythm. 2/6 MR. +S3 Lungs: clear Abdomen: soft, nontender, nondistended. No hepatosplenomegaly. No bruits or masses. Good bowel sounds. Extremities: no cyanosis, clubbing, rash, edema Neuro: alert & orientedx3, cranial nerves grossly intact. moves all 4 extremities w/o difficulty. Affect pleasant  Telemetry: SR  80-90s  Labs: Basic Metabolic Panel:  Lab 09/10/12 1610 09/09/12 0613 09/08/12 1325 09/08/12 0455 09/07/12 1950  NA 138 139 141 140 139  K 4.3 4.0 4.2 2.9* 5.3*  CL 101 103 100 101 106  CO2 26 25 29 27 21   GLUCOSE 97 111* 105* 108* 90  BUN 17 19 13 13 13   CREATININE 1.04 1.05 1.10 0.98 1.03  CALCIUM 9.4 9.1 9.7 -- --  MG -- 2.1 -- -- --  PHOS -- -- -- -- --    Liver Function Tests: No results found for this basename: AST:5,ALT:5,ALKPHOS:5,BILITOT:5,PROT:5,ALBUMIN:5 in the last 168 hours No results found for this basename: LIPASE:5,AMYLASE:5 in the last 168 hours No results found for this basename: AMMONIA:3 in the last 168 hours  CBC:  Lab 09/07/12 1950  WBC 3.8*  NEUTROABS 1.6*  HGB 10.7*  HCT 32.7*  MCV 84.3  PLT 280    Cardiac Enzymes:  Lab 09/08/12 1222 09/08/12 0455 09/08/12 0035  CKTOTAL -- -- --  CKMB -- -- --  CKMBINDEX -- -- --  TROPONINI <0.30 <0.30 <0.30    BNP: BNP (last 3 results)  Basename 09/07/12 1957  PROBNP 4088.0*     Other results:    Imaging: No results found.   Medications:     Scheduled Medications:    . aspirin EC  81 mg Oral Daily  . digoxin  0.125 mg Oral Daily  . enalapril  5 mg Oral BID  .  furosemide  40 mg Intravenous Daily  . potassium chloride  40 mEq Oral Daily  . sodium chloride  3 mL Intravenous Q12H    Infusions:    PRN Medications: sodium chloride, acetaminophen, ondansetron (ZOFRAN) IV, sodium chloride   Assessment:   1. Acute systolic heart failure, EF25-30%  2. Anemia  3. Tobacco use  Plan/Discussion:    Her HF is stable. Will proceed with R&L heart cath today to evaluate etiology of LV dysfunction and hemodynamics.  She tolerated the addition of enalapril and digoxin.  Hopefully can add low-dose b-blocker soon.   Length of Stay: 3   Keiffer Piper 09/10/2012, 8:07 AM

## 2012-09-10 NOTE — CV Procedure (Addendum)
Cardiac Cath Procedure Note  Indication: New onset HF and cardiomyopathy  Procedures performed:  1) Right heart cathererization 2) Selective coronary angiography 3) Left heart catheterization 4) Left ventriculogram  Description of procedure:     The risks and indication of the procedure were explained. Consent was signed and placed on the chart. An appropriate timeout was taken prior to the procedure. The right groin was prepped and draped in the routine sterile fashion and anesthetized with 1% local lidocaine.   A 5 FR arterial sheath was placed in the right femoral artery using a modified Seldinger technique. Standard catheters including a JL4, JR4 and angled pigtail were used. All catheter exchanges were made over a wire. A 7 FR venous sheath was placed in the right femoral vein using a modified Seldinger technique. A standard Swan-Ganz catheter was used for the procedure.   Complications:  None apparent  Findings:  RA = 3 RV = 24/3/5 PA = 25/3 (10) PCW = 6 Fick cardiac output/index = 5.0/3.3 PVR = 0.8 woods FA sat = 91% PA sat = 58%, 63%  Ao Pressure: 112/63 (83) LV Pressure:  109/7/11 There was no signficant gradient across the aortic valve on pullback.  Left main: Normal   LAD: Gives off 3 diagonals. Mild diffuse plaquing with 30-40% lesion in midsection. Otherwise normal.  LCX: Tiny ramus. Large OM-1 & OM-2. 30% lesion in ostial LCX. Otherwise mild plaque.   RCA: Dominant vessel with diffuse disease. Diffuse 30-40% through midsection. In distal RCA there is a ruptured plaque with about 60% stenosis. PDA moderate diffuse disease. In distal RCA just prior to take-off of PLs there is a very focal area of myocardial bridging with severe systolic compression.   LV-gram done in the RAO projection: Ejection fraction = 25-30%. There is a large aneurysm at the base to mid inferior wall. Anterior wall mild HK. No significant MR noted.  Assessment: 1. 1V CAD with severe  ischemic CM and large inferior wall aneurysm 2. Well compensated filling pressures and outputs  Plan/Discussion:  She appears to have had an out of hospital inferior MI with recanalization of the distal RCA. There is severe LV dysfunction. No appropriate targets for PCI. (Reviewed with Dr. [place]) Will need aggressive medical therapy for both CAD and HF. Start low-dose b-blocker and statin. Given size of aneurysm would favor coumadin to prevent development of LV clot and embolic phenomenon. Hopefully home tomorrow. Images reviewed in detail with multiple family members.  Arvilla Meres, MD 1:01 PM

## 2012-09-11 ENCOUNTER — Encounter (HOSPITAL_COMMUNITY): Payer: Self-pay | Admitting: Internal Medicine

## 2012-09-11 DIAGNOSIS — I252 Old myocardial infarction: Secondary | ICD-10-CM

## 2012-09-11 DIAGNOSIS — I214 Non-ST elevation (NSTEMI) myocardial infarction: Secondary | ICD-10-CM

## 2012-09-11 DIAGNOSIS — I2510 Atherosclerotic heart disease of native coronary artery without angina pectoris: Secondary | ICD-10-CM

## 2012-09-11 HISTORY — DX: Non-ST elevation (NSTEMI) myocardial infarction: I21.4

## 2012-09-11 HISTORY — DX: Atherosclerotic heart disease of native coronary artery without angina pectoris: I25.10

## 2012-09-11 LAB — BASIC METABOLIC PANEL
BUN: 17 mg/dL (ref 6–23)
CO2: 23 mEq/L (ref 19–32)
Chloride: 100 mEq/L (ref 96–112)
Creatinine, Ser: 0.97 mg/dL (ref 0.50–1.10)
Glucose, Bld: 124 mg/dL — ABNORMAL HIGH (ref 70–99)
Potassium: 4.4 mEq/L (ref 3.5–5.1)

## 2012-09-11 LAB — CBC
HCT: 35.7 % — ABNORMAL LOW (ref 36.0–46.0)
Hemoglobin: 11.7 g/dL — ABNORMAL LOW (ref 12.0–15.0)
MCH: 27.6 pg (ref 26.0–34.0)
MCHC: 32.8 g/dL (ref 30.0–36.0)
MCV: 84.2 fL (ref 78.0–100.0)
RDW: 13.7 % (ref 11.5–15.5)

## 2012-09-11 LAB — PROTIME-INR: Prothrombin Time: 13.8 seconds (ref 11.6–15.2)

## 2012-09-11 MED ORDER — CARVEDILOL 3.125 MG PO TABS: 3.1250 mg | ORAL_TABLET | Freq: Two times a day (BID) | ORAL | Status: DC

## 2012-09-11 MED ORDER — ASPIRIN 81 MG PO TBEC: 81.0000 mg | DELAYED_RELEASE_TABLET | Freq: Every day | ORAL | Status: DC

## 2012-09-11 MED ORDER — ENALAPRIL MALEATE 5 MG PO TABS: 5.0000 mg | ORAL_TABLET | Freq: Two times a day (BID) | ORAL | Status: DC

## 2012-09-11 MED ORDER — ATORVASTATIN CALCIUM 40 MG PO TABS: 40.0000 mg | ORAL_TABLET | Freq: Every day | ORAL | Status: DC

## 2012-09-11 MED ORDER — WARFARIN SODIUM 6 MG PO TABS
6.0000 mg | ORAL_TABLET | Freq: Once | ORAL | Status: DC
  Filled 2012-09-11: qty 1

## 2012-09-11 MED ORDER — WARFARIN SODIUM 6 MG PO TABS: 6.0000 mg | ORAL_TABLET | Freq: Once | ORAL | Status: DC

## 2012-09-11 MED ORDER — FUROSEMIDE 40 MG PO TABS: 40.0000 mg | ORAL_TABLET | Freq: Every day | ORAL | Status: DC

## 2012-09-11 MED ORDER — DIGOXIN 125 MCG PO TABS: 0.1250 mg | ORAL_TABLET | Freq: Every day | ORAL | Status: DC

## 2012-09-11 NOTE — Discharge Summary (Signed)
Physician Discharge Summary  Faith Gray ZOX:[id number] DOB: Jan 15, 1941 DOA: 09/07/2012  PCP: Default, Provider, MD  Admit date: 09/07/2012 Discharge date: 09/11/2012  Time spent: > 35 minutes  Recommendations for Outpatient Follow-up:  1. Please be sure to follow up with your cardiologist as per your discussion with the cardiologist while in house 2. Check INR levels and adjust medications pending INR values post hospital stay.  Discharge Diagnoses:  Principal Problem:  *CHF exacerbation Active Problems:  SOB (shortness of breath)  Chest pain  EKG abnormalities  Normochromic anemia  Acute systolic heart failure  Chest tightness  CAD (coronary artery disease)  NSTEMI (non-ST elevated myocardial infarction)   Discharge Condition: Stable  Diet recommendation: Cardiac diet  Filed Weights   09/09/12 0413 09/10/12 0513 09/11/12 0458  Weight: 55.792 kg (123 lb) 55.7 kg (122 lb 12.7 oz) 56.019 kg (123 lb 8 oz)    History of present illness:  From original HPI: 71 yo female healthy comes in with several days of sob and chest tightness from difficulty breathing with some mild le edema and trouble sleeping on her back at night. No prev h/o cad/or chf. No fevers. Dry cough. Has gotten iv lasix in the ED and has urinated over 5 times and feels much better. No more chest tightness.   Hospital Course:  Patient presented with new onset heart failure. ECHO demonstrated new onset systolic hear failure with wall motion abnormality. Underwent cath on 12/24 which demonstrated mild diffuse nonobstructive plaques in the LAD and RCA, 30% stenosis on ostial left circ with evidence of plaque rupture in the distal RCA with recannulization. No stent or angioplasty required. LV gram demonstrated EF of 25-30% with large aneurysm at the base to mid inferior wall. DX = Ischemic cardiomyopathy. Troponins were negative, telemetry was unremarkable. Other causes of heart failure were considered: TSH 1.8, ANA  pending, HIV neg, RPR NR, and SPEP pending.  - F/u ANA  - F/u SPEP  - Patient started on coumadin on 12/24 due to aneurysmal dilation of the LV per Dr. Gala Romney. Last INR 1.07 and patient has follow up appointment with cardiology already set up of which at that juncture patient should have her INR rechecked and coumadin further tailored pending INR values. - ASA, BB, ACEI, lasix, and digoxin added  - Can follow up with cardiology for further evaluation and consideration of ICD as outpatient   Procedures:  Echocardiogram  Cath  Consultations:  Cardiology Dr. Gala Romney  Discharge Exam: Filed Vitals:   09/10/12 2037 09/11/12 0458 09/11/12 0502 09/11/12 0902  BP: 115/75  102/75 100/58  Pulse: 77  72 77  Temp: 98.4 F (36.9 C)  98.3 F (36.8 C)   TempSrc: Oral  Oral   Resp: 20  16   Height:      Weight:  56.019 kg (123 lb 8 oz)    SpO2: 99%  100%     General: Pt in NAD, sitting up smililng, Alert and Awake Cardiovascular: RRR, No MRG Respiratory: CTA BL, no wheezes, no increase work of breathing.  Discharge Instructions  Discharge Orders    Future Appointments: Provider: Department: Dept Phone: Center:   09/17/2012 10:30 AM [facility] [phone] None     Future Orders Please Complete By Expires   Diet - low sodium heart healthy      Increase activity slowly      Call MD for:  temperature >100.4      Call MD  for:  severe uncontrolled pain      Call MD for:  persistant dizziness or light-headedness      Call MD for:  extreme fatigue      Discharge instructions      Comments:   Please be sure to follow up with your cardiologist. Pt has f/u in HF clinic 12/31 at 1030am to further titrate meds and check labs.  Follow up with your PCP in 1-2 weeks should any new concerns arise.  You will need to have your INR rechecked within the next 3-4 days and have your pcp or cardiologist further titrate your coumadin pending  value.       Medication List     As of 09/11/2012 10:53 AM    TAKE these medications         aspirin 81 MG EC tablet   Take 1 tablet (81 mg total) by mouth daily.      atorvastatin 40 MG tablet   Commonly known as: LIPITOR   Take 1 tablet (40 mg total) by mouth daily at 6 PM.      carvedilol 3.125 MG tablet   Commonly known as: COREG   Take 1 tablet (3.125 mg total) by mouth 2 (two) times daily with a meal.      digoxin 0.125 MG tablet   Commonly known as: LANOXIN   Take 1 tablet (0.125 mg total) by mouth daily.      enalapril 5 MG tablet   Commonly known as: VASOTEC   Take 1 tablet (5 mg total) by mouth 2 (two) times daily.      furosemide 40 MG tablet   Commonly known as: LASIX   Take 1 tablet (40 mg total) by mouth daily.      warfarin 6 MG tablet   Commonly known as: COUMADIN   Take 1 tablet (6 mg total) by mouth one time only at 6 PM.           Follow-up Information    Follow up with Arvilla Meres, MD. On 09/17/2012. (at 10:30a  [facility] Code 0009))    Contact information:   [address] [place] [address] [phone]           The results of significant diagnostics from this hospitalization (including imaging, microbiology, ancillary and laboratory) are listed below for reference.    Significant Diagnostic Studies: Dg Chest 2 View  09/07/2012  *RADIOLOGY REPORT*  Clinical Data: Palpitations, chest tightness, shortness of breath, cough  CHEST - 2 VIEW  Comparison: None  Findings: Enlargement cardiac silhouette. Minimal pulmonary vascular congestion. Calcified tortuous aorta. Right pleural effusion and basilar atelectasis. Perihilar interstitial infiltrates bilaterally favor edema over infection. No pneumothorax. Question osseous demineralization.  IMPRESSION: Enlargement of cardiac silhouette with pulmonary vascular congestion and diffuse bilateral interstitial infiltrates favor pulmonary edema over infection. Associated right  pleural effusion and basilar atelectasis.   Original Report Authenticated By: Ulyses Southward, M.D.     Microbiology: No results found for this or any previous visit (from the past 240 hour(s)).   Labs: Basic Metabolic Panel:  Lab 09/11/12 9147 09/10/12 1636 09/10/12 0505 09/09/12 0613 09/08/12 1325 09/08/12 0455  NA 135 -- 138 139 141 140  K 4.4 -- 4.3 4.0 4.2 2.9*  CL 100 -- 101 103 100 101  CO2 23 -- 26 25 29 27   GLUCOSE 124* -- 97 111* 105* 108*  BUN 17 -- 17 19 13 13   CREATININE 0.97 0.99 1.04 1.05 1.10 --  CALCIUM 9.4 -- 9.4 9.1 9.7 9.1  MG -- -- -- 2.1 -- --  PHOS -- -- -- -- -- --   Liver Function Tests: No results found for this basename: AST:5,ALT:5,ALKPHOS:5,BILITOT:5,PROT:5,ALBUMIN:5 in the last 168 hours No results found for this basename: LIPASE:5,AMYLASE:5 in the last 168 hours No results found for this basename: AMMONIA:5 in the last 168 hours CBC:  Lab 09/11/12 0932 09/07/12 1950  WBC 5.2 3.8*  NEUTROABS -- 1.6*  HGB 11.7* 10.7*  HCT 35.7* 32.7*  MCV 84.2 84.3  PLT 304 280   Cardiac Enzymes:  Lab 09/08/12 1222 09/08/12 0455 09/08/12 0035  CKTOTAL -- -- --  CKMB -- -- --  CKMBINDEX -- -- --  TROPONINI <0.30 <0.30 <0.30   BNP: BNP (last 3 results)  Basename 09/07/12 1957  PROBNP 4088.0*   CBG: No results found for this basename: GLUCAP:5 in the last 168 hours     Signed:  Penny Pia  [facility] 09/11/2012, 10:53 AM

## 2012-09-11 NOTE — Progress Notes (Signed)
ANTICOAGULATION CONSULT NOTE - Follow-Up Consult  Pharmacy Consult for coumadin Indication: large inferior wall aneurysm, coumadin to prevent development LV clot  No Known Allergies  Patient Measurements: Height: 5\' 1"  (154.9 cm) Weight: 123 lb 8 oz (56.019 kg) IBW/kg (Calculated) : 47.8    Vital Signs: Temp: 98.3 F (36.8 C) (12/25 0502) Temp src: Oral (12/25 0502) BP: 102/75 mmHg (12/25 0502) Pulse Rate: 72  (12/25 0502)  Labs:  Alvira Philips 09/11/12 0520 09/10/12 1636 09/10/12 0828 09/10/12 0505 09/09/12 0613 09/08/12 1222  HGB -- -- -- -- -- --  HCT -- -- -- -- -- --  PLT -- -- -- -- -- --  APTT -- -- -- -- -- --  LABPROT 13.8 -- 13.1 -- -- --  INR 1.07 -- 1.00 -- -- --  HEPARINUNFRC -- -- -- -- -- --  CREATININE -- 0.99 -- 1.04 1.05 --  CKTOTAL -- -- -- -- -- --  CKMB -- -- -- -- -- --  TROPONINI -- -- -- -- -- <0.30    Estimated Creatinine Clearance: 39.9 ml/min (by C-G formula based on Cr of 0.99).  Assessment: 71 yo lady on coumadin to prevent development of LV clot and embolic phenomenon with LV aneurysm. INR with small movement past first dose of coumadin. No bleeding noted. Continues on Lovenox 40mg  SQ daily.  Goal of Therapy:  INR 2-3 Monitor platelets by anticoagulation protocol: Yes   Plan:  Coumadin 6 mg po again today. Check daily PT/INR Monitor for s&s bleeding.  Christoper Fabian, PharmD, BCPS Clinical pharmacist, pager [phone] 09/11/2012,8:16 AM

## 2012-09-11 NOTE — Progress Notes (Signed)
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 Over this time she also noted orthopnea/PND. Minimal edema.  She was found to have acute systolic HF, EF 25-305%.   Echo on 12/23 showed LVEF 25-30% with diffuse hypokinesis. Mild MR, mildly dilated LA, PAPP 34 mmHg. Small pericardial effusion. EKG reviewed with inferolateral TWI (no prior EKG to review).   Cath yesterday with low filling pressures. Preserved CO. Evidence of previous RCA infarct. No targets for PCI.   Feels fine today. Weight up a pound. No CP/SOB. BP 102/75   Objective:   Weight Range:  Vital Signs:   Temp:  [97.5 F (36.4 C)-98.4 F (36.9 C)] 98.3 F (36.8 C) (12/25 0502) Pulse Rate:  [72-77] 72  (12/25 0502) Resp:  [16-20] 16  (12/25 0502) BP: (102-120)/(70-75) 102/75 mmHg (12/25 0502) SpO2:  [99 %-100 %] 100 % (12/25 0502) Weight:  [56.019 kg (123 lb 8 oz)] 56.019 kg (123 lb 8 oz) (12/25 0458) Last BM Date: 09/08/12  Weight change: Filed Weights   09/09/12 0413 09/10/12 0513 09/11/12 0458  Weight: 55.792 kg (123 lb) 55.7 kg (122 lb 12.7 oz) 56.019 kg (123 lb 8 oz)    Intake/Output:  No intake or output data in the 24 hours ending 09/11/12 0806   Physical Exam: General:  Well appearing. No resp difficulty HEENT: normal Neck: supple. JVP 5. Carotids 2+ bilat; no bruits. No lymphadenopathy or thryomegaly appreciated. Cor: PMI nondisplaced. Regular rate & rhythm. 2/6 MR.  Lungs: clear Abdomen: soft, nontender, nondistended. No hepatosplenomegaly. No bruits or masses. Good bowel sounds. Extremities: no cyanosis, clubbing, rash, edema Neuro: alert & orientedx3, cranial nerves grossly intact. moves all 4 extremities w/o difficulty. Affect pleasant  Telemetry: SR  70-80s  Labs: Basic Metabolic Panel:  Lab 09/10/12 1610 09/10/12 0505 09/09/12 0613 09/08/12 1325 09/08/12 0455 09/07/12 1950  NA -- 138 139 141 140 139  K -- 4.3 4.0 4.2 2.9* 5.3*  CL -- 101 103 100 101 106  CO2 -- 26 25 29 27 21   GLUCOSE -- 97 111* 105* 108* 90  BUN -- 17 19 13 13 13   CREATININE 0.99 1.04 1.05 1.10 0.98 --  CALCIUM -- 9.4 9.1 9.7 -- --  MG -- -- 2.1 -- -- --  PHOS -- -- -- -- -- --    Liver Function Tests: No results found for this basename: AST:5,ALT:5,ALKPHOS:5,BILITOT:5,PROT:5,ALBUMIN:5 in the last 168 hours No results found for this basename: LIPASE:5,AMYLASE:5 in the last 168 hours No results found for this basename: AMMONIA:3 in the last 168 hours  CBC:  Lab 09/07/12 1950  WBC 3.8*  NEUTROABS 1.6*  HGB 10.7*  HCT 32.7*  MCV 84.3  PLT 280    Cardiac Enzymes:  Lab 09/08/12 1222 09/08/12 0455 09/08/12 0035  CKTOTAL -- -- --  CKMB -- -- --  CKMBINDEX -- -- --  TROPONINI <0.30 <0.30 <0.30    BNP: BNP (last 3 results)  Basename 09/07/12 1957  PROBNP 4088.0*     Other results:    Imaging: No results found.   Medications:     Scheduled Medications:    . aspirin EC  81 mg Oral Daily  . atorvastatin  40 mg Oral q1800  . carvedilol  3.125  mg Oral BID WC  . coumadin book   Does not apply Once  . digoxin  0.125 mg Oral Daily  . enalapril  5 mg Oral BID  . enoxaparin (LOVENOX) injection  40 mg Subcutaneous Q24H  . furosemide  40 mg Oral Daily  . potassium chloride  40 mEq Oral Daily  . sodium chloride  3 mL Intravenous Q12H  . warfarin   Does not apply Once  . Warfarin - Pharmacist Dosing Inpatient   Does not apply q1800    Infusions:    PRN Medications: sodium chloride, acetaminophen, ondansetron (ZOFRAN) IV, sodium chloride   Assessment:   1. Acute systolic heart failure, EF25-30%  2. Anemia  3. Tobacco use  Plan/Discussion:    Cath results reviewed with her. She is much improved. Will check labs this am. If  stable can go home today on following medications.  Carvedilol 3.125 bid Lisinopril 10 daily Digoxin 0.125 daily Lasix 40 daily Kcl 20 daily Atorvastatin 40 daily Asa 81 mg Coumadin INR 2.0-2.5  Long talk about use of sliding scale diuretics and daily weights. (needs to get scale)  Has f/u in HF clinic 12/31 at 1030am to further titrate meds and check labs. Can consider spiro soon. ICD probably in 1 month.   Will need f/u in coumadin clinic - I left message with them to contact her.  Will need to arrange cardiac rehab as outpatient  [name] 8:14 AM    Length of Stay: 4   Kaynen Minner 09/11/2012, 8:06 AM

## 2012-09-11 NOTE — Progress Notes (Signed)
Assessment unchanged. Discussed D/C instructions with pt and family including CHF education. Pt verbalized understanding. Rx given to pt. Tele and IV removed. Pt awaiting ride. Will leave via W/C.

## 2012-09-13 LAB — PROTEIN ELECTROPHORESIS, SERUM
Albumin ELP: 54.7 % — ABNORMAL LOW (ref 55.8–66.1)
Alpha-1-Globulin: 5.1 % — ABNORMAL HIGH (ref 2.9–4.9)
Alpha-2-Globulin: 10.7 % (ref 7.1–11.8)
Gamma Globulin: 14.3 % (ref 11.1–18.8)
Total Protein ELP: 7.8 g/dL (ref 6.0–8.3)

## 2012-09-16 ENCOUNTER — Ambulatory Visit (INDEPENDENT_AMBULATORY_CARE_PROVIDER_SITE_OTHER): Payer: Medicare Other | Admitting: Pharmacist

## 2012-09-16 DIAGNOSIS — Z7901 Long term (current) use of anticoagulants: Secondary | ICD-10-CM | POA: Insufficient documentation

## 2012-09-16 DIAGNOSIS — I253 Aneurysm of heart: Secondary | ICD-10-CM | POA: Insufficient documentation

## 2012-09-16 NOTE — Patient Instructions (Signed)

## 2012-09-17 ENCOUNTER — Encounter (HOSPITAL_COMMUNITY): Payer: Self-pay

## 2012-09-17 ENCOUNTER — Ambulatory Visit (HOSPITAL_COMMUNITY)
Admit: 2012-09-17 | Discharge: 2012-09-17 | Disposition: A | Discharge status: Home / Self Care | Payer: Medicare Other | Attending: Internal Medicine | Admitting: Internal Medicine

## 2012-09-17 VITALS — BP 154/86 | HR 56 | Wt 128.0 lb

## 2012-09-17 DIAGNOSIS — I5022 Chronic systolic (congestive) heart failure: Secondary | ICD-10-CM

## 2012-09-17 DIAGNOSIS — I253 Aneurysm of heart: Secondary | ICD-10-CM

## 2012-09-17 DIAGNOSIS — I5042 Chronic combined systolic (congestive) and diastolic (congestive) heart failure: Secondary | ICD-10-CM | POA: Insufficient documentation

## 2012-09-17 LAB — BASIC METABOLIC PANEL
BUN: 14 mg/dL (ref 6–23)
CO2: 29 mEq/L (ref 19–32)
GFR calc non Af Amer: 59 mL/min — ABNORMAL LOW (ref >90)
Glucose, Bld: 96 mg/dL (ref 70–99)
Potassium: 4.3 mEq/L (ref 3.5–5.1)
Sodium: 140 mEq/L (ref 135–145)

## 2012-09-17 NOTE — Patient Instructions (Addendum)
Follow up in 2 weeks  Do the following things EVERYDAY: 1) Weigh yourself in the morning before breakfast. Write it down and keep it in a log. 2) Take your medicines as prescribed 3) Eat low salt foods--Limit salt (sodium) to 2000 mg per day.  4) Stay as active as you can everyday 5) Limit all fluids for the day to less than 2 liters 

## 2012-09-17 NOTE — Assessment & Plan Note (Addendum)
Continue coumadin. No bleeding problems.

## 2012-09-17 NOTE — Progress Notes (Signed)
Patient ID: Faith Gray, female   DOB: 05-26-1941, 71 y.o.   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   Discharged from [facility] 09/11/12 for new onset acute systolic heart failure. Echo on 12/23 EF 25-30% with diffuse hypokinesis. Mild MR, mildly dilated LA, PAPP 34 mmHg. Small pericardial effusion. She was started on coumadin on 12/24 due to aneurysmal dilation of the LV.   She returns for post hospital follow up with her daughter. Denies SOB/PND/Orthopnea.  Complaint with medications. Did not take medications this am. Retired. Lives with daughter and grand children. Stopped smoking. No bleeding problems.    ROS: All systems negative except as listed in HPI, PMH and Problem List.  Past Medical History  Diagnosis Date  . Shortness of breath   . CHF (congestive heart failure)   . Anemia   . CAD (coronary artery disease) 09/11/2012  . NSTEMI (non-ST elevated myocardial infarction) 09/11/2012    Current Outpatient Prescriptions  Medication Sig Dispense Refill  . aspirin EC 81 MG EC tablet Take 1 tablet (81 mg total) by mouth daily.  30 tablet  0  . atorvastatin (LIPITOR) 40 MG tablet Take 1 tablet (40 mg total) by mouth daily at 6 PM.  30 tablet  0  . carvedilol (COREG) 3.125 MG tablet Take 1 tablet (3.125 mg total) by mouth 2 (two) times daily with a meal.  60 tablet  0  . digoxin (LANOXIN) 0.125 MG tablet Take 1 tablet (0.125 mg total) by mouth daily.  30 tablet  0  . enalapril (VASOTEC) 5 MG tablet Take 1 tablet (5 mg total) by mouth 2 (two) times daily.  60 tablet  0  . furosemide (LASIX) 40 MG tablet Take 1 tablet (40 mg total) by mouth daily.  30 tablet  0  . warfarin (COUMADIN) 6 MG tablet Take 1 tablet (6 mg total) by mouth one time only at 6 PM.  15 tablet  0     PHYSICAL EXAM: Filed Vitals:   09/17/12  1055  BP: 154/86  Pulse: 56  Weight: 128 lb (58.06 kg)  SpO2: 100%    General:  Well appearing. No resp difficulty HEENT: normal Neck: supple. JVP flat. Carotids 2+ bilaterally; no bruits. No lymphadenopathy or thryomegaly appreciated. Cor: PMI normal. Regular rate & rhythm. No rubs, gallops or murmurs. Lungs: clear Abdomen: soft, nontender, nondistended. No hepatosplenomegaly. No bruits or masses. Good bowel sounds. Extremities: no cyanosis, clubbing, rash, edema Neuro: alert & orientedx3, cranial nerves grossly intact. Moves all 4 extremities w/o difficulty. Affect pleasant.      ASSESSMENT & PLAN:

## 2012-09-17 NOTE — Assessment & Plan Note (Signed)
NYHA II. Volume status stable. Check BMET. Potassium and creatinine ok. Increase Enalapril 10 mg bid. Reinforced daily weights, low salt food choices, and medication compliance. Follow up in 3 weeks.

## 2012-09-18 DIAGNOSIS — I5021 Acute systolic (congestive) heart failure: Secondary | ICD-10-CM

## 2012-09-23 ENCOUNTER — Telehealth (HOSPITAL_COMMUNITY): Payer: Self-pay | Admitting: Adult Health

## 2012-09-23 MED ORDER — ENALAPRIL MALEATE 10 MG PO TABS: 10.0000 mg | ORAL_TABLET | Freq: Two times a day (BID) | ORAL | Status: DC

## 2012-09-23 NOTE — Telephone Encounter (Signed)
Provided labs results. Instructed to increase enalapril to 10 mg twice a day.   Next follow up appointment 10/04/11 at 11:15.   Mrs Cassetta verbalized understanding.

## 2012-09-24 ENCOUNTER — Telehealth: Payer: Self-pay | Admitting: *Deleted

## 2012-09-24 NOTE — Telephone Encounter (Signed)
Tammy nurse with [facility] called stating unable to locate pt to obtain INR. Asked Tammy to call pts contacts to find out where pt is. Called Tammy back and she states that unable to locate pt at this time. This nurse instructed Tammy if unable to locate pt today to do INR tomorrow as we need her INR results and Tammy states she will  obtain and call with results

## 2012-09-25 ENCOUNTER — Ambulatory Visit: Payer: Self-pay | Admitting: Cardiovascular Disease

## 2012-09-25 DIAGNOSIS — I253 Aneurysm of heart: Secondary | ICD-10-CM

## 2012-09-25 DIAGNOSIS — Z7901 Long term (current) use of anticoagulants: Secondary | ICD-10-CM

## 2012-09-25 MED ORDER — WARFARIN SODIUM 6 MG PO TABS: 6.0000 mg | ORAL_TABLET | ORAL | Status: DC

## 2012-09-27 ENCOUNTER — Telehealth (HOSPITAL_COMMUNITY): Payer: Self-pay | Admitting: Cardiology

## 2012-09-27 NOTE — Telephone Encounter (Signed)
Scott,RN with [facility] called to report pts vitals  B/p 13/70 HR 38-56 thready and irregular R 18 O2 98% Weight 128 lbs    Per vo Amy Clegg NP ok to decrease carvedilol to once daily Pt aware and voiced understanding

## 2012-10-01 ENCOUNTER — Telehealth: Payer: Self-pay

## 2012-10-01 NOTE — Telephone Encounter (Signed)
Lauren - can you add this test and route result to Dr Malachi Bonds? thx ----- Message ----- From: Renae Fickle, MD Sent: 09/26/2012 12:28 PM To: Tonny Bollman, MD Subject: abnl lab Pls draw IFE with next INR. SPEP was abnormal in December. Cannot identify PCP. ----- Message ----- From: Lab In [place] Interface Sent: 09/13/2012 9:46 AM To: Renae Fickle, MD    I spoke with [facility] [address] and they will be going to the pt's home on 10/02/12 for INR.  I spoke with Kerry Kass and have added Immunofixation Serum test to the pt's lab results. They will fax results to our office and then we will forward these to Dr Malachi Bonds.

## 2012-10-02 ENCOUNTER — Ambulatory Visit: Payer: Self-pay | Admitting: Pharmacist

## 2012-10-02 ENCOUNTER — Encounter: Payer: Self-pay | Admitting: Cardiovascular Disease

## 2012-10-02 DIAGNOSIS — I253 Aneurysm of heart: Secondary | ICD-10-CM

## 2012-10-02 DIAGNOSIS — Z7901 Long term (current) use of anticoagulants: Secondary | ICD-10-CM

## 2012-10-02 LAB — POCT INR: INR: 3.3

## 2012-10-03 ENCOUNTER — Encounter (HOSPITAL_COMMUNITY): Payer: Self-pay

## 2012-10-03 ENCOUNTER — Ambulatory Visit (HOSPITAL_COMMUNITY)
Admission: RE | Admit: 2012-10-03 | Discharge: 2012-10-03 | Disposition: A | Discharge status: Home / Self Care | Payer: Medicare Other | Source: Ambulatory Visit | Attending: Internal Medicine | Admitting: Internal Medicine

## 2012-10-03 VITALS — BP 150/82 | HR 68 | Wt 127.0 lb

## 2012-10-03 DIAGNOSIS — I5022 Chronic systolic (congestive) heart failure: Secondary | ICD-10-CM

## 2012-10-03 DIAGNOSIS — I10 Essential (primary) hypertension: Secondary | ICD-10-CM

## 2012-10-03 HISTORY — DX: Essential (primary) hypertension: I10

## 2012-10-03 MED ORDER — ISOSORBIDE MONONITRATE ER 30 MG PO TB24: 30.0000 mg | ORAL_TABLET | Freq: Every day | ORAL | Status: DC

## 2012-10-03 MED ORDER — HYDRALAZINE HCL 25 MG PO TABS: 25.0000 mg | ORAL_TABLET | Freq: Three times a day (TID) | ORAL | Status: DC

## 2012-10-03 NOTE — Patient Instructions (Addendum)
Add hydralazine and imdur  Follow up 3 weeks  Do the following things EVERYDAY: 1) Weigh yourself in the morning before breakfast. Write it down and keep it in a log. 2) Take your medicines as prescribed 3) Eat low salt foods-Limit salt (sodium) to 2000 mg per day.  4) Stay as active as you can everyday 5) Limit all fluids for the day to less than 2 liters

## 2012-10-03 NOTE — Progress Notes (Signed)
   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   Discharged from [facility] 09/11/12 for new onset acute systolic heart failure. Echo on 12/23 EF 25-30% with diffuse hypokinesis. Mild MR, mildly dilated LA, PAPP 34 mmHg. Small pericardial effusion. She was started on coumadin on 12/24 due to aneurysmal dilation of the LV.   She returns for follow up today.  She has been feeling pretty good except for blood pressure has been running in the 140-150s over the last week.  Yesterday [facility] called and BP 157/86.  Last week her HR was low 38-56 so Amy cut back carvedilol to once daily.  She says someone also told her to cut back enalapril.  HR has improved.  She denies dizziness or syncope.  No orthopnea/PND or edema.    She is retired, living with daughter and grandchildren.  Stopped using cigarettes but using electronic cig.    ROS: All systems negative except as listed in HPI, PMH and Problem List.  Past Medical History  Diagnosis Date  . Shortness of breath   . CHF (congestive heart failure)   . Anemia   . CAD (coronary artery disease) 09/11/2012  . NSTEMI (non-ST elevated myocardial infarction) 09/11/2012    Current Outpatient Prescriptions  Medication Sig Dispense Refill  . aspirin EC 81 MG EC tablet Take 1 tablet (81 mg total) by mouth daily.  30 tablet  0  . atorvastatin (LIPITOR) 40 MG tablet Take 1 tablet (40 mg total) by mouth daily at 6 PM.  30 tablet  0  . carvedilol (COREG) 3.125 MG tablet Take 3.125 mg by mouth daily.      . digoxin (LANOXIN) 0.125 MG tablet Take 1 tablet (0.125 mg total) by mouth daily.  30 tablet  0  . enalapril (VASOTEC) 10 MG tablet Take 5 mg by mouth 2 (two) times daily.      . furosemide (LASIX) 40 MG tablet Take 1 tablet (40 mg total) by mouth daily.  30 tablet  0  . warfarin  (COUMADIN) 6 MG tablet Take 1 tablet (6 mg total) by mouth as directed.  45 tablet  3     PHYSICAL EXAM: Filed Vitals:   10/03/12 1123  BP: 150/82  Pulse: 68  Weight: 127 lb (57.607 kg)  SpO2: 100%    General:  Well appearing. No resp difficulty HEENT: normal Neck: supple. JVP flat. Carotids 2+ bilaterally; no bruits. No lymphadenopathy or thryomegaly appreciated. Cor: PMI normal. Regular rate & rhythm. No rubs, gallops or murmurs. Lungs: clear Abdomen: soft, nontender, nondistended. No hepatosplenomegaly. No bruits or masses. Good bowel sounds. Extremities: no cyanosis, clubbing, rash, edema Neuro: alert & orientedx3, cranial nerves grossly intact. Moves all 4 extremities w/o difficulty. Affect pleasant.      ASSESSMENT & PLAN:

## 2012-10-04 ENCOUNTER — Encounter (HOSPITAL_COMMUNITY): Payer: Self-pay

## 2012-10-04 DIAGNOSIS — I110 Hypertensive heart disease with heart failure: Secondary | ICD-10-CM | POA: Insufficient documentation

## 2012-10-04 DIAGNOSIS — I10 Essential (primary) hypertension: Secondary | ICD-10-CM

## 2012-10-04 HISTORY — DX: Essential (primary) hypertension: I10

## 2012-10-04 NOTE — Assessment & Plan Note (Signed)
Uncontrolled.  Add hydralazine/imdur as above.

## 2012-10-04 NOTE — Assessment & Plan Note (Signed)
Volume status stable.  Will continue current regimen with addition of hydralazine/nitrates for afterload reduction and better BP control.  Have re-educated on low sodium diet, fluid restrictions and sliding scale lasix.  Will continue carvedilol once daily at this time but may consider increasing to BID at follow up if HR has remained stable.  Continue to titrate medications at follow up with repeat echo in 2 months.

## 2012-10-15 ENCOUNTER — Telehealth: Payer: Self-pay | Admitting: Pharmacist

## 2012-10-15 ENCOUNTER — Ambulatory Visit: Payer: Self-pay | Admitting: Cardiology

## 2012-10-15 ENCOUNTER — Telehealth (HOSPITAL_COMMUNITY): Payer: Self-pay | Admitting: Cardiology

## 2012-10-15 DIAGNOSIS — I253 Aneurysm of heart: Secondary | ICD-10-CM

## 2012-10-15 DIAGNOSIS — Z7901 Long term (current) use of anticoagulants: Secondary | ICD-10-CM

## 2012-10-15 LAB — POCT INR: INR: 1.2

## 2012-10-15 NOTE — Telephone Encounter (Signed)
Faith Gray WOULD LIKE TO KNOW IF WE CAN START SOMETHING FOR DEPRESSION. PT HAS BEEN CRYING AND IS JUST VERY DEPRESSED. PLEASE CALL WITH ORDERS

## 2012-10-15 NOTE — Telephone Encounter (Signed)
Called [facility] to draw PT/INR that was due 1/22 - they will draw today and call [facility] with results

## 2012-10-16 ENCOUNTER — Telehealth: Payer: Self-pay | Admitting: Internal Medicine

## 2012-10-16 NOTE — Telephone Encounter (Signed)
Updated Crystal of results of abnormal IFE.  Patient needs referral to hematologist for follow up, however, she does not have a primary care doctor.  Asked her to contact the main [facility] to find out if they have openings since she receives her other care through them.  Gave Crystal my contact information so the new PCP may contact me for the test results and with questions.

## 2012-10-23 ENCOUNTER — Ambulatory Visit: Payer: Self-pay | Admitting: Internal Medicine

## 2012-10-23 DIAGNOSIS — I253 Aneurysm of heart: Secondary | ICD-10-CM

## 2012-10-23 DIAGNOSIS — Z7901 Long term (current) use of anticoagulants: Secondary | ICD-10-CM

## 2012-10-23 LAB — POCT INR: INR: 2.3

## 2012-10-24 ENCOUNTER — Ambulatory Visit (HOSPITAL_COMMUNITY): Admission: RE | Admit: 2012-10-24 | Payer: Medicare Other | Source: Ambulatory Visit

## 2012-10-24 NOTE — Telephone Encounter (Signed)
Faith Gray was advised when she called that we do not prescribe meds to call pcp, pt no showed for appt today

## 2012-10-30 ENCOUNTER — Ambulatory Visit: Payer: Self-pay | Admitting: Cardiology

## 2012-10-30 DIAGNOSIS — Z7901 Long term (current) use of anticoagulants: Secondary | ICD-10-CM

## 2012-10-30 DIAGNOSIS — I253 Aneurysm of heart: Secondary | ICD-10-CM

## 2012-10-30 LAB — POCT INR: INR: 1.8

## 2012-11-07 ENCOUNTER — Ambulatory Visit (INDEPENDENT_AMBULATORY_CARE_PROVIDER_SITE_OTHER): Payer: Medicare Other | Admitting: Internal Medicine

## 2012-11-07 DIAGNOSIS — Z7901 Long term (current) use of anticoagulants: Secondary | ICD-10-CM

## 2012-11-07 DIAGNOSIS — I253 Aneurysm of heart: Secondary | ICD-10-CM

## 2012-11-07 LAB — POCT INR: INR: 3.5

## 2012-11-13 ENCOUNTER — Ambulatory Visit: Payer: Self-pay | Admitting: Internal Medicine

## 2012-11-13 DIAGNOSIS — Z7901 Long term (current) use of anticoagulants: Secondary | ICD-10-CM

## 2012-11-13 DIAGNOSIS — I253 Aneurysm of heart: Secondary | ICD-10-CM

## 2012-11-13 LAB — POCT INR: INR: 2.1

## 2012-11-20 ENCOUNTER — Encounter (HOSPITAL_COMMUNITY): Payer: Medicare Other

## 2012-12-02 ENCOUNTER — Ambulatory Visit (HOSPITAL_COMMUNITY)
Admission: RE | Admit: 2012-12-02 | Discharge: 2012-12-02 | Disposition: A | Discharge status: Home / Self Care | Payer: Medicare Other | Source: Ambulatory Visit | Attending: Internal Medicine | Admitting: Internal Medicine

## 2012-12-02 VITALS — BP 136/78 | HR 91 | Wt 123.0 lb

## 2012-12-02 DIAGNOSIS — I10 Essential (primary) hypertension: Secondary | ICD-10-CM

## 2012-12-02 DIAGNOSIS — I5022 Chronic systolic (congestive) heart failure: Secondary | ICD-10-CM

## 2012-12-02 DIAGNOSIS — I214 Non-ST elevation (NSTEMI) myocardial infarction: Secondary | ICD-10-CM

## 2012-12-02 MED ORDER — ENALAPRIL MALEATE 10 MG PO TABS: 10.0000 mg | ORAL_TABLET | Freq: Two times a day (BID) | ORAL | Status: DC

## 2012-12-02 NOTE — Progress Notes (Signed)
Patient ID: Faith Gray, female   DOB: [birth year], 72 y.o.   MRN: [id number]   Weight Range   125-127 pounds  Baseline proBNP   4000 09/07/12  PCP: None   HPI: Faith Gray is a delightful 72 y.o. 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   Discharged from [facility] 09/11/12 for new onset acute systolic heart failure. Echo on 12/23 EF 25-30% with diffuse hypokinesis. Mild MR, mildly dilated LA, PAPP 34 mmHg. Small pericardial effusion. She was started on coumadin on 12/24 due to aneurysmal dilation of the LV.   She returns for follow up today.  Last visit  hydralazine/IMDUR added. Beta blocker had been cut back to 3.125 mg daily due to bradycardia however she continued to take 3.125 mg twice a day. Denies SOB/PND/Orthopnea/CP. Denies bleeding problems. Able to walk up steps but has to go slow. Weight at home 125-128 pounds. She has not required any extra lasix. Compliant with medications. Not smoking. Discharged from [facility]. She is retired, living with daughter and grandchildren.  Stopped using cigarettes but using electronic cig.    ROS: All systems negative except as listed in HPI, PMH and Problem List.  Past Medical History  Diagnosis Date  . Shortness of breath   . CHF (congestive heart failure)   . Anemia   . CAD (coronary artery disease) 09/11/2012  . NSTEMI (non-ST elevated myocardial infarction) 09/11/2012  . Essential hypertension 10/04/2012    Current Outpatient Prescriptions  Medication Sig Dispense Refill  . aspirin EC 81 MG EC tablet Take 1 tablet (81 mg total) by mouth daily.  30 tablet  0  . atorvastatin (LIPITOR) 40 MG tablet Take 1 tablet (40 mg total) by mouth daily at 6 PM.  30 tablet  0  . carvedilol (COREG) 3.125 MG tablet Take 3.125 mg by mouth daily.      . digoxin (LANOXIN) 0.125 MG tablet Take 1 tablet (0.125 mg total) by mouth daily.  30 tablet  0  . enalapril (VASOTEC) 10 MG tablet Take 5  mg by mouth 2 (two) times daily.      . furosemide (LASIX) 40 MG tablet Take 1 tablet (40 mg total) by mouth daily.  30 tablet  0  . hydrALAZINE (APRESOLINE) 25 MG tablet Take 1 tablet (25 mg total) by mouth 3 (three) times daily.      . isosorbide mononitrate (IMDUR) 30 MG 24 hr tablet Take 1 tablet (30 mg total) by mouth daily.      [name] warfarin (COUMADIN) 6 MG tablet Take 1 tablet (6 mg total) by mouth as directed.  45 tablet  3   No current facility-administered medications for this encounter.     PHYSICAL EXAM: Filed Vitals:   12/02/12 1341  BP: 136/78  Pulse: 91  Weight: 123 lb (55.792 kg)  SpO2: 100%    General:  Well appearing. No resp difficulty HEENT: normal Neck: supple. JVP flat. Carotids 2+ bilaterally; no bruits. No lymphadenopathy or thryomegaly appreciated. Cor: PMI normal. Regular rate & rhythm. No rubs, gallops or murmurs. Lungs: coarse throughout Abdomen: soft, nontender, nondistended. No hepatosplenomegaly. No bruits or masses. Good bowel sounds. Extremities: no cyanosis, clubbing, rash, edema Neuro: alert & orientedx3, cranial nerves grossly intact. Moves all 4 extremities w/o difficulty. Affect pleasant.      ASSESSMENT & PLAN:

## 2012-12-02 NOTE — Assessment & Plan Note (Signed)
Volume status stable. Will not titrate HF medications because she is not sure about the dosage and frequency of medications. I have asked her to bring all medications to next office visit. Follow up in 1 month. Will consider repeat ECHO at next OV.

## 2012-12-02 NOTE — Patient Instructions (Addendum)
Follow up 1 month.  Do the following things EVERYDAY: 1) Weigh yourself in the morning before breakfast. Write it down and keep it in a log. 2) Take your medicines as prescribed 3) Eat low salt foods-Limit salt (sodium) to 2000 mg per day.  4) Stay as active as you can everyday 5) Limit all fluids for the day to less than 2 liters  

## 2012-12-02 NOTE — Assessment & Plan Note (Signed)
No evidence of ischemia. Continue current regimen.   

## 2012-12-02 NOTE — Assessment & Plan Note (Signed)
BP ok. Continue current regimen.

## 2013-01-08 ENCOUNTER — Ambulatory Visit (HOSPITAL_COMMUNITY): Payer: Medicare Other

## 2013-01-13 ENCOUNTER — Encounter (HOSPITAL_COMMUNITY): Payer: Medicare Other

## 2013-02-18 ENCOUNTER — Other Ambulatory Visit (HOSPITAL_COMMUNITY): Payer: Self-pay | Admitting: Physician Assistant

## 2013-02-24 ENCOUNTER — Observation Stay (HOSPITAL_COMMUNITY)
Admission: EM | Admit: 2013-02-24 | Discharge: 2013-02-25 | Disposition: A | Discharge status: Home / Self Care | Payer: Medicare Other | Attending: Internal Medicine | Admitting: Internal Medicine

## 2013-02-24 ENCOUNTER — Encounter (HOSPITAL_COMMUNITY): Payer: Self-pay | Admitting: *Deleted

## 2013-02-24 ENCOUNTER — Emergency Department (HOSPITAL_COMMUNITY): Payer: Medicare Other

## 2013-02-24 DIAGNOSIS — I369 Nonrheumatic tricuspid valve disorder, unspecified: Secondary | ICD-10-CM

## 2013-02-24 DIAGNOSIS — I252 Old myocardial infarction: Secondary | ICD-10-CM | POA: Insufficient documentation

## 2013-02-24 DIAGNOSIS — D649 Anemia, unspecified: Secondary | ICD-10-CM | POA: Insufficient documentation

## 2013-02-24 DIAGNOSIS — I10 Essential (primary) hypertension: Secondary | ICD-10-CM | POA: Diagnosis present

## 2013-02-24 DIAGNOSIS — I253 Aneurysm of heart: Secondary | ICD-10-CM | POA: Diagnosis present

## 2013-02-24 DIAGNOSIS — I5042 Chronic combined systolic (congestive) and diastolic (congestive) heart failure: Secondary | ICD-10-CM | POA: Diagnosis present

## 2013-02-24 DIAGNOSIS — I509 Heart failure, unspecified: Secondary | ICD-10-CM | POA: Insufficient documentation

## 2013-02-24 DIAGNOSIS — R55 Syncope and collapse: Secondary | ICD-10-CM | POA: Diagnosis present

## 2013-02-24 DIAGNOSIS — Z7901 Long term (current) use of anticoagulants: Secondary | ICD-10-CM | POA: Insufficient documentation

## 2013-02-24 DIAGNOSIS — I5022 Chronic systolic (congestive) heart failure: Secondary | ICD-10-CM | POA: Insufficient documentation

## 2013-02-24 DIAGNOSIS — I2510 Atherosclerotic heart disease of native coronary artery without angina pectoris: Secondary | ICD-10-CM | POA: Diagnosis present

## 2013-02-24 DIAGNOSIS — Z79899 Other long term (current) drug therapy: Secondary | ICD-10-CM | POA: Insufficient documentation

## 2013-02-24 LAB — COMPREHENSIVE METABOLIC PANEL
BUN: 14 mg/dL (ref 6–23)
Calcium: 8.7 mg/dL (ref 8.4–10.5)
Creatinine, Ser: 0.96 mg/dL (ref 0.50–1.10)
GFR calc Af Amer: 67 mL/min — ABNORMAL LOW (ref >90)
GFR calc non Af Amer: 58 mL/min — ABNORMAL LOW (ref >90)
Glucose, Bld: 102 mg/dL — ABNORMAL HIGH (ref 70–99)
Total Protein: 6.9 g/dL (ref 6.0–8.3)

## 2013-02-24 LAB — PROTIME-INR: INR: 1.23 (ref 0.00–1.49)

## 2013-02-24 LAB — CBC WITH DIFFERENTIAL/PLATELET
Basophils Absolute: 0 10*3/uL (ref 0.0–0.1)
Basophils Relative: 0 % (ref 0–1)
Eosinophils Absolute: 0.1 10*3/uL (ref 0.0–0.7)
HCT: 32.8 % — ABNORMAL LOW (ref 36.0–46.0)
HCT: 29.5 % — ABNORMAL LOW (ref 36.0–46.0)
Hemoglobin: 9.9 g/dL — ABNORMAL LOW (ref 12.0–15.0)
Lymphocytes Relative: 39 % (ref 12–46)
Lymphocytes Relative: 33 % (ref 12–46)
MCV: 83.5 fL (ref 78.0–100.0)
Monocytes Absolute: 0.6 10*3/uL (ref 0.1–1.0)
Monocytes Absolute: 0.4 10*3/uL (ref 0.1–1.0)
Monocytes Relative: 8 % (ref 3–12)
Monocytes Relative: 7 % (ref 3–12)
Neutro Abs: 3.9 10*3/uL (ref 1.7–7.7)
Neutro Abs: 3.4 10*3/uL (ref 1.7–7.7)
Neutrophils Relative %: 59 % (ref 43–77)
Platelets: 245 10*3/uL (ref 150–400)
RBC: 3.93 MIL/uL (ref 3.87–5.11)
RDW: 13 % (ref 11.5–15.5)
WBC: 7.7 10*3/uL (ref 4.0–10.5)
WBC: 5.7 10*3/uL (ref 4.0–10.5)

## 2013-02-24 LAB — BASIC METABOLIC PANEL
BUN: 12 mg/dL (ref 6–23)
Calcium: 9.2 mg/dL (ref 8.4–10.5)
Creatinine, Ser: 0.96 mg/dL (ref 0.50–1.10)
GFR calc Af Amer: 67 mL/min — ABNORMAL LOW (ref >90)
GFR calc non Af Amer: 58 mL/min — ABNORMAL LOW (ref >90)
Potassium: 3.4 mEq/L — ABNORMAL LOW (ref 3.5–5.1)

## 2013-02-24 LAB — MAGNESIUM: Magnesium: 1.8 mg/dL (ref 1.5–2.5)

## 2013-02-24 LAB — TROPONIN I: Troponin I: <0.3 ng/mL (ref <0.30)

## 2013-02-24 MED ORDER — ACETAMINOPHEN 650 MG RE SUPP: 650.0000 mg | Freq: Four times a day (QID) | RECTAL | Status: DC | PRN

## 2013-02-24 MED ORDER — ISOSORBIDE MONONITRATE ER 30 MG PO TB24
30.0000 mg | ORAL_TABLET | Freq: Every day | ORAL | Status: DC
  Administered 2013-02-24 – 2013-02-25 (×2): 30 mg via ORAL
  Filled 2013-02-24 (×2): qty 1

## 2013-02-24 MED ORDER — HYDRALAZINE HCL 25 MG PO TABS
25.0000 mg | ORAL_TABLET | Freq: Three times a day (TID) | ORAL | Status: DC
  Administered 2013-02-24 – 2013-02-25 (×4): 25 mg via ORAL
  Filled 2013-02-24 (×6): qty 1

## 2013-02-24 MED ORDER — FUROSEMIDE 40 MG PO TABS
40.0000 mg | ORAL_TABLET | Freq: Every day | ORAL | Status: DC
  Administered 2013-02-24 – 2013-02-25 (×2): 40 mg via ORAL
  Filled 2013-02-24 (×2): qty 1

## 2013-02-24 MED ORDER — ONDANSETRON HCL 4 MG PO TABS: 4.0000 mg | ORAL_TABLET | Freq: Four times a day (QID) | ORAL | Status: DC | PRN

## 2013-02-24 MED ORDER — ACETAMINOPHEN 325 MG PO TABS
650.0000 mg | ORAL_TABLET | Freq: Four times a day (QID) | ORAL | Status: DC | PRN
  Administered 2013-02-24 – 2013-02-25 (×4): 650 mg via ORAL
  Filled 2013-02-24 (×4): qty 2

## 2013-02-24 MED ORDER — DIGOXIN 125 MCG PO TABS
0.1250 mg | ORAL_TABLET | Freq: Every day | ORAL | Status: DC
  Administered 2013-02-24: 0.125 mg via ORAL
  Filled 2013-02-24 (×2): qty 1

## 2013-02-24 MED ORDER — ONDANSETRON HCL 4 MG/2ML IJ SOLN
4.0000 mg | INTRAMUSCULAR | Status: DC | PRN
  Filled 2013-02-24: qty 2

## 2013-02-24 MED ORDER — POTASSIUM CHLORIDE CRYS ER 20 MEQ PO TBCR
40.0000 meq | EXTENDED_RELEASE_TABLET | Freq: Once | ORAL | Status: AC
  Administered 2013-02-24: 40 meq via ORAL
  Filled 2013-02-24: qty 2

## 2013-02-24 MED ORDER — WARFARIN SODIUM 6 MG PO TABS
12.0000 mg | ORAL_TABLET | Freq: Once | ORAL | Status: AC
  Administered 2013-02-24: 12 mg via ORAL
  Filled 2013-02-24: qty 2

## 2013-02-24 MED ORDER — WARFARIN - PHARMACIST DOSING INPATIENT
Freq: Every day | Status: DC
  Administered 2013-02-24: 18:00:00

## 2013-02-24 MED ORDER — ASPIRIN EC 81 MG PO TBEC
81.0000 mg | DELAYED_RELEASE_TABLET | Freq: Every day | ORAL | Status: DC
  Administered 2013-02-24 – 2013-02-25 (×2): 81 mg via ORAL
  Filled 2013-02-24 (×2): qty 1

## 2013-02-24 MED ORDER — ENALAPRIL MALEATE 5 MG PO TABS
5.0000 mg | ORAL_TABLET | Freq: Two times a day (BID) | ORAL | Status: DC
  Administered 2013-02-24: 5 mg via ORAL
  Filled 2013-02-24 (×4): qty 1

## 2013-02-24 MED ORDER — SODIUM CHLORIDE 0.9 % IJ SOLN
3.0000 mL | Freq: Two times a day (BID) | INTRAMUSCULAR | Status: DC
  Administered 2013-02-24: 3 mL via INTRAVENOUS

## 2013-02-24 MED ORDER — ATORVASTATIN CALCIUM 40 MG PO TABS
40.0000 mg | ORAL_TABLET | Freq: Every day | ORAL | Status: DC
  Administered 2013-02-24: 40 mg via ORAL
  Filled 2013-02-24 (×2): qty 1

## 2013-02-24 MED ORDER — ONDANSETRON HCL 4 MG/2ML IJ SOLN: 4.0000 mg | Freq: Four times a day (QID) | INTRAMUSCULAR | Status: DC | PRN

## 2013-02-24 MED ORDER — CARVEDILOL 3.125 MG PO TABS
3.1250 mg | ORAL_TABLET | Freq: Two times a day (BID) | ORAL | Status: DC
  Administered 2013-02-24 – 2013-02-25 (×3): 3.125 mg via ORAL
  Filled 2013-02-24 (×5): qty 1

## 2013-02-24 MED ORDER — SODIUM CHLORIDE 0.9 % IJ SOLN
3.0000 mL | Freq: Two times a day (BID) | INTRAMUSCULAR | Status: DC
  Administered 2013-02-24 (×2): 3 mL via INTRAVENOUS

## 2013-02-24 NOTE — Progress Notes (Addendum)
I have seen and assessed patient and I agree with Dr. Katherene Ponto assessment and plan. Will check carotid Dopplers. Check a 2-D echo. Will monitor on telemetry. Follow.

## 2013-02-24 NOTE — ED Notes (Signed)
The pt  Arrived by [facility] from home where she was in  The br and was unable to have a bm she felt dizzy and almost passed out but not totally.  ems started an iv gave aspirin 324mg  .  Gave a bolus of nss.  Pt alert on arrival no chest pain or other discomfort.  Hx of a stemi in december

## 2013-02-24 NOTE — Progress Notes (Signed)
  Echocardiogram 2D Echocardiogram has been performed.  Faith Gray 02/24/2013, 10:50 AM

## 2013-02-24 NOTE — ED Notes (Addendum)
Episodes of N/V prior to EMS arrival. CO dizziness now only. Episode happened after she was straining to have a BM. States she felt like she was having palpitations earlier this evening.

## 2013-02-24 NOTE — Progress Notes (Signed)
VASCULAR LAB PRELIMINARY  PRELIMINARY  PRELIMINARY  PRELIMINARY  Carotid Dopplers completed.    Preliminary report:  There is no significant ICA stenosis (0-39%) ICA stenosis.  Vertebral artery flow is antegrade.  Osamu Olguin, RVT 02/24/2013, 10:46 AM

## 2013-02-24 NOTE — ED Provider Notes (Signed)
History     CSN: [id number]  Arrival date & time 02/24/13  0148   First MD Initiated Contact with Patient 02/24/13 0159      Chief Complaint  Patient presents with  . Near Syncope    (Consider location/radiation/quality/duration/timing/severity/associated sxs/prior treatment) HPI 72 year old female presents emergency department via EMS after syncopal event.  Patient reports she was in well health until around 5 PM when she developed a frontal dull headache.  Headache is not severe, not worst of her life.  She reports that she did relate, around 10 PM after dinner.  She took her Coreg.  Patient attempted to have a bowel movement, but was unable to.  Soon after she had nausea and vomiting.  She reports vomiting 3 times.  Soon after she had a syncopal event.  She denies previous syncope.  She reports going completely out and falling.  She reports she is feeling somewhat better now.  No further nausea.  She, reports she's been off her to document her isosorbide for the last 4 days.  She was told by pharmacist that she may not have to pay a co-pay, so she is waiting for this issue to be resolved.  She denies any chest pain, no shortness of breath, no abdominal pain.  She still has a slight headache.  Patient has history of congestive heart failure, coronary disease, nSTEMI.  She is on Coumadin. Past Medical History  Diagnosis Date  . Shortness of breath   . CHF (congestive heart failure)   . Anemia   . CAD (coronary artery disease) 09/11/2012  . NSTEMI (non-ST elevated myocardial infarction) 09/11/2012  . Essential hypertension 10/04/2012    History reviewed. No pertinent past surgical history.  No family history on file.  History  Substance Use Topics  . Smoking status: Former Smoker -- 0.50 packs/day for 20 years    Types: Cigarettes  . Smokeless tobacco: Not on file  . Alcohol Use: No    OB History   Grav Para Term Preterm Abortions TAB SAB Ect Mult Living                   Review of Systems  See History of Present Illness; otherwise all other systems are reviewed and negative Allergies  Review of patient's allergies indicates no known allergies.  Home Medications   Current Outpatient Rx  Name  Route  Sig  Dispense  Refill  . aspirin EC 81 MG EC tablet   Oral   Take 1 tablet (81 mg total) by mouth daily.   30 tablet   0   . atorvastatin (LIPITOR) 40 MG tablet   Oral   Take 1 tablet (40 mg total) by mouth daily at 6 PM.   30 tablet   0   . carvedilol (COREG) 3.125 MG tablet   Oral   Take 3.125 mg by mouth 2 (two) times daily with a meal.          . digoxin (LANOXIN) 0.125 MG tablet   Oral   Take 0.125 mg by mouth daily.         . enalapril (VASOTEC) 5 MG tablet   Oral   Take 5 mg by mouth 2 (two) times daily.         . furosemide (LASIX) 40 MG tablet   Oral   Take 1 tablet (40 mg total) by mouth daily.   30 tablet   0   . hydrALAZINE (APRESOLINE) 25 MG tablet  Oral   Take 1 tablet (25 mg total) by mouth 3 (three) times daily.         . isosorbide mononitrate (IMDUR) 30 MG 24 hr tablet   Oral   Take 30 mg by mouth daily.         [name] warfarin (COUMADIN) 6 MG tablet   Oral   Take 6-9 mg by mouth See admin instructions. Takes 1.5 tablet (9mg ) on Monday. Takes 1 tablet (6mg ) all other days.           BP 113/72  Pulse 84  Temp(Src) 98 F (36.7 C) (Oral)  Resp 18  SpO2 100%  Physical Exam  Nursing note and vitals reviewed. Constitutional: She is oriented to person, place, and time. She appears well-developed and well-nourished.  HENT:  Head: Normocephalic and atraumatic.  Right Ear: External ear normal.  Left Ear: External ear normal.  Nose: Nose normal.  Mouth/Throat: Oropharynx is clear and moist.  Eyes: Conjunctivae and EOM are normal. Pupils are equal, round, and reactive to light.  Neck: Normal range of motion. Neck supple. No JVD present. No tracheal deviation present. No thyromegaly present.   Cardiovascular: Normal rate, regular rhythm, normal heart sounds and intact distal pulses.  Exam reveals no gallop and no friction rub.   No murmur heard. Pulmonary/Chest: Effort normal and breath sounds normal. No stridor. No respiratory distress. She has no wheezes. She has no rales. She exhibits no tenderness.  Abdominal: Soft. Bowel sounds are normal. She exhibits no distension and no mass. There is no tenderness. There is no rebound and no guarding.  Musculoskeletal: Normal range of motion. She exhibits no edema and no tenderness.  Lymphadenopathy:    She has no cervical adenopathy.  Neurological: She is alert and oriented to person, place, and time. She has normal reflexes. No cranial nerve deficit. She exhibits normal muscle tone. Coordination normal.  Skin: Skin is warm and dry. No rash noted. No erythema. No pallor.  Psychiatric: She has a normal mood and affect. Her behavior is normal. Judgment and thought content normal.    ED Course  Procedures (including critical care time)  Labs Reviewed  DIGOXIN LEVEL - Abnormal; Notable for the following:    Digoxin Level <0.3 (*)    All other components within normal limits  PROTIME-INR - Abnormal; Notable for the following:    Prothrombin Time 15.3 (*)    All other components within normal limits  CBC WITH DIFFERENTIAL - Abnormal; Notable for the following:    Hemoglobin 11.1 (*)    HCT 32.8 (*)    All other components within normal limits  BASIC METABOLIC PANEL - Abnormal; Notable for the following:    Potassium 3.4 (*)    Glucose, Bld 111 (*)    GFR calc non Af Amer 58 (*)    GFR calc Af Amer 67 (*)    All other components within normal limits  POCT I-STAT TROPONIN I  POCT I-STAT TROPONIN I   Ct Head Wo Contrast  02/24/2013   *RADIOLOGY REPORT*  Clinical Data: Frontal headaches.  Syncope.  The patient is on Coumadin.  CT HEAD WITHOUT CONTRAST  Technique:  Contiguous axial images were obtained from the base of the skull through  the vertex without contrast.  Comparison: None.  Findings: Mild cerebral atrophy.  No ventricular dilatation.  No mass effect or midline shift.  No abnormal extra-axial fluid collections.  Gray-white matter junctions are distinct.  Basal cisterns are not effaced.  No  evidence of acute intracranial hemorrhage.  No depressed skull fractures.  Visualized paranasal sinuses and mastoid air cells are not opacified.  Vascular calcifications in the intracranial arteries.  IMPRESSION: No acute intracranial abnormalities.  Chronic atrophy.   Original Report Authenticated By: Burman Nieves, M.D.    Date: 02/24/2013  Rate: 66  Rhythm: normal sinus rhythm  QRS Axis: normal  Intervals: normal  ST/T Wave abnormalities: normal  Conduction Disutrbances:none  Narrative Interpretation: q waves have developed inferior leads.  T wave inversions are resovling  Old EKG Reviewed: changes noted    1. Syncope   2. Aneurysm of left ventricle of heart   3. CAD (coronary artery disease)   4. Chronic systolic heart failure       MDM  72 year old female with syncope after attempting to have a bowel movement associated with nausea, vomiting.  Most likely vagal.  Given her past medical history, however, will discuss with hospitalist for admission and syncope workup.        Olivia Mackie, MD 02/24/13 [phone]

## 2013-02-24 NOTE — H&P (Signed)
[facility] History and Physical  Nayda Riesen ZOX:[id number] DOB: October 09, 1940 DOA: 02/24/2013  Referring physician: ER physician. PCP: Default, Provider, MD  Specialists: [facility] cardiology.  Chief Complaint: Near syncope.  HPI: Faith Gray is a 72 y.o. female with history of chronic systolic heart failure last EF measured was 25-30%, LV aneurysm, CAD was brought to the ER after patient felt dizzy and fell. Patient states that since 9 PM last night she has been feeling dizzy and had gone to the bathroom but was unable to move bowels after which he stood up and felt more dizzy and fell. Her family heard a sound and went to see her immediately. She did not lose consciousness as per the patient and family. Patient denies any chest pain or shortness of breath did have mild nausea. Patient did not have any focal deficit but did have mild headache. In the ER patient was found to be not orthostatic. CT head is negative for any acute. Cardiac enzymes were negative and EKG shows inferior lead changes compatible to the old EKG. Patient has been admitted for further observation. Patient had not been taking her digoxin for last few days because patient was instructed to confirm with her cardiologist.  Review of Systems: As presented in the history of presenting illness, rest negative.  Past Medical History  Diagnosis Date  . Shortness of breath   . CHF (congestive heart failure)   . Anemia   . CAD (coronary artery disease) 09/11/2012  . NSTEMI (non-ST elevated myocardial infarction) 09/11/2012  . Essential hypertension 10/04/2012   Past Surgical History  Procedure Laterality Date  . Cardiac catheterization     Social History:  reports that she has quit smoking. Her smoking use included Cigarettes. She has a 10 pack-year smoking history. She does not have any smokeless tobacco history on file. She reports that she does not drink alcohol or use illicit drugs. Home. where does patient  live-- Can do ADLs. Can patient participate in ADLs?  No Known Allergies  History reviewed. No pertinent family history.    Prior to Admission medications   Medication Sig Start Date End Date Taking? Authorizing Provider  aspirin EC 81 MG EC tablet Take 1 tablet (81 mg total) by mouth daily. 09/11/12  Yes Penny Pia, MD  atorvastatin (LIPITOR) 40 MG tablet Take 1 tablet (40 mg total) by mouth daily at 6 PM. 09/11/12  Yes Penny Pia, MD  carvedilol (COREG) 3.125 MG tablet Take 3.125 mg by mouth 2 (two) times daily with a meal.  09/11/12  Yes Penny Pia, MD  digoxin (LANOXIN) 0.125 MG tablet Take 0.125 mg by mouth daily.   Yes Historical Provider, MD  enalapril (VASOTEC) 5 MG tablet Take 5 mg by mouth 2 (two) times daily.   Yes Historical Provider, MD  furosemide (LASIX) 40 MG tablet Take 1 tablet (40 mg total) by mouth daily. 09/11/12  Yes Penny Pia, MD  hydrALAZINE (APRESOLINE) 25 MG tablet Take 1 tablet (25 mg total) by mouth 3 (three) times daily. 10/03/12  Yes Hadassah Pais, PA-C  isosorbide mononitrate (IMDUR) 30 MG 24 hr tablet Take 30 mg by mouth daily.   Yes Historical Provider, MD  warfarin (COUMADIN) 6 MG tablet Take 6-9 mg by mouth See admin instructions. Takes 1.5 tablet (9mg ) on Monday. Takes 1 tablet (6mg ) all other days. 09/25/12  Yes Tonny Bollman, MD   Physical Exam: Filed Vitals:   02/24/13 0355 02/24/13 0415 02/24/13 0445 02/24/13 0531  BP: 113/72 111/56 104/45  104/62  Pulse:  81 66   Temp: 98 F (36.7 C)     TempSrc: Oral     Resp: 18 19 18    SpO2: 100% 98% 99%      General:  Well-developed well-nourished.  Eyes: Anicteric no pallor.  ENT: No discharge from the ears eyes nose mouth.  Neck: No mass felt.  Cardiovascular: S1-S2 heard.  Respiratory: No rhonchi no crepitations.  Abdomen: Soft nontender bowel sounds present.  Skin: No rash.  Musculoskeletal: No edema.  Psychiatric: Appears normal.  Neurologic: Alert and oriented to time place  and person. Moves all extremities.  Labs on Admission:  Basic Metabolic Panel:  Recent Labs Lab 02/24/13 0201  NA 138  K 3.4*  CL 100  CO2 31  GLUCOSE 111*  BUN 12  CREATININE 0.96  CALCIUM 9.2   Liver Function Tests: No results found for this basename: AST, ALT, ALKPHOS, BILITOT, PROT, ALBUMIN,  in the last 168 hours No results found for this basename: LIPASE, AMYLASE,  in the last 168 hours No results found for this basename: AMMONIA,  in the last 168 hours CBC:  Recent Labs Lab 02/24/13 0201  WBC 7.7  NEUTROABS 3.9  HGB 11.1*  HCT 32.8*  MCV 83.5  PLT 245   Cardiac Enzymes: No results found for this basename: CKTOTAL, CKMB, CKMBINDEX, TROPONINI,  in the last 168 hours  BNP (last 3 results)  Recent Labs  09/07/12 1957  PROBNP 4088.0*   CBG: No results found for this basename: GLUCAP,  in the last 168 hours  Radiological Exams on Admission: Ct Head Wo Contrast  02/24/2013   *RADIOLOGY REPORT*  Clinical Data: Frontal headaches.  Syncope.  The patient is on Coumadin.  CT HEAD WITHOUT CONTRAST  Technique:  Contiguous axial images were obtained from the base of the skull through the vertex without contrast.  Comparison: None.  Findings: Mild cerebral atrophy.  No ventricular dilatation.  No mass effect or midline shift.  No abnormal extra-axial fluid collections.  Gray-white matter junctions are distinct.  Basal cisterns are not effaced.  No evidence of acute intracranial hemorrhage.  No depressed skull fractures.  Visualized paranasal sinuses and mastoid air cells are not opacified.  Vascular calcifications in the intracranial arteries.  IMPRESSION: No acute intracranial abnormalities.  Chronic atrophy.   Original Report Authenticated By: Burman Nieves, M.D.    EKG: Independently reviewed. Sinus rhythm with Q waves in inferior leads. Compatible with old EKG.  Assessment/Plan Principal Problem:   Syncope Active Problems:   CAD (coronary artery disease)    Aneurysm of left ventricle of heart   Chronic systolic heart failure   Essential hypertension   1. Near-syncope - recheck orthostatics. Closely monitor in telemetry. 2. Chronic systolic heart failure - continue home medications. Presently not short of breath. 3. Left ventricle aneurysm on Coumadin - Coumadin per pharmacy. 4. CAD - denies any chest pain. Cycle cardiac markers. 5. Hypertension - continue medications. As suggested earlier check orthostatics. 6. Chronic anemia - follow CBC.    Code Status: Full code.  Family Communication: Patient's daughter at the bedside.  Disposition Plan: Admit for observation.    Emidio Warrell N. [facility] Pager [phone].  If 7PM-7AM, please contact night-coverage [email or web address] Password [email or web address] 02/24/2013, 5:48 AM

## 2013-02-24 NOTE — Progress Notes (Signed)
ANTICOAGULATION CONSULT NOTE - Initial Consult  Pharmacy Consult for Coumadin Indication: LV failure w/ aneurysm  No Known Allergies  Patient Measurements: Height: 5\' 2"  (157.5 cm) Weight: 122 lb 12.7 oz (55.7 kg) IBW/kg (Calculated) : 50.1  Vital Signs: Temp: 97.9 F (36.6 C) (06/09 0651) Temp src: Oral (06/09 0651) BP: 109/64 mmHg (06/09 0651) Pulse Rate: 64 (06/09 0500)  Labs:  Recent Labs  02/24/13 0201  HGB 11.1*  HCT 32.8*  PLT 245  LABPROT 15.3*  INR 1.23  CREATININE 0.96    Estimated Creatinine Clearance: 42.5 ml/min (by C-G formula based on Cr of 0.96).   Medical History: Past Medical History  Diagnosis Date  . Shortness of breath   . CHF (congestive heart failure)   . Anemia   . CAD (coronary artery disease) 09/11/2012  . NSTEMI (non-ST elevated myocardial infarction) 09/11/2012  . Essential hypertension 10/04/2012    Medications:  Prescriptions prior to admission  Medication Sig Dispense Refill  . aspirin EC 81 MG EC tablet Take 1 tablet (81 mg total) by mouth daily.  30 tablet  0  . atorvastatin (LIPITOR) 40 MG tablet Take 1 tablet (40 mg total) by mouth daily at 6 PM.  30 tablet  0  . carvedilol (COREG) 3.125 MG tablet Take 3.125 mg by mouth 2 (two) times daily with a meal.       . digoxin (LANOXIN) 0.125 MG tablet Take 0.125 mg by mouth daily.      . enalapril (VASOTEC) 5 MG tablet Take 5 mg by mouth 2 (two) times daily.      . furosemide (LASIX) 40 MG tablet Take 1 tablet (40 mg total) by mouth daily.  30 tablet  0  . hydrALAZINE (APRESOLINE) 25 MG tablet Take 1 tablet (25 mg total) by mouth 3 (three) times daily.      . isosorbide mononitrate (IMDUR) 30 MG 24 hr tablet Take 30 mg by mouth daily.      [name] warfarin (COUMADIN) 6 MG tablet Take 6-9 mg by mouth See admin instructions. Takes 1.5 tablet (9mg ) on Monday. Takes 1 tablet (6mg ) all other days.       Scheduled:  . aspirin EC  81 mg Oral Daily  . atorvastatin  40 mg Oral q1800  .  carvedilol  3.125 mg Oral BID WC  . digoxin  0.125 mg Oral Daily  . enalapril  5 mg Oral BID  . furosemide  40 mg Oral Daily  . hydrALAZINE  25 mg Oral TID  . isosorbide mononitrate  30 mg Oral Daily  . sodium chloride  3 mL Intravenous Q12H  . sodium chloride  3 mL Intravenous Q12H  . warfarin  12 mg Oral ONCE-1800  . Warfarin - Pharmacist Dosing Inpatient   Does not apply q1800    Assessment: 72yo female w/ recent STEMI Dec 2013 c/o HA, N/V, and near-syncopal event, pt notes palpitations prior to event, thought to be vagal, admitted for further w/u and obs, to continue Coumadin for LV failure w/ aneurysm; admitted with subtherapeutic INR.  Goal of Therapy:  INR 2-3   Plan:  Will give boosted dose of Coumadin 12mg  po x1 today and monitor INR for dose adjustments.  Vernard Gambles, PharmD, BCPS  02/24/2013,7:04 AM

## 2013-02-24 NOTE — Progress Notes (Deleted)
  Echocardiogram 2D Echocardiogram has been performed.  Faith Gray 02/24/2013, 11:13 AM

## 2013-02-24 NOTE — Progress Notes (Signed)
Utilization Review Completed.   Hennie Gosa, RN, BSN Nurse Case Manager  [phone]  

## 2013-02-25 ENCOUNTER — Telehealth (HOSPITAL_COMMUNITY): Payer: Self-pay | Admitting: *Deleted

## 2013-02-25 ENCOUNTER — Ambulatory Visit (HOSPITAL_COMMUNITY): Admission: RE | Admit: 2013-02-25 | Payer: Medicare Other | Source: Ambulatory Visit

## 2013-02-25 DIAGNOSIS — R55 Syncope and collapse: Secondary | ICD-10-CM

## 2013-02-25 DIAGNOSIS — I10 Essential (primary) hypertension: Secondary | ICD-10-CM

## 2013-02-25 LAB — CBC
Hemoglobin: 10.3 g/dL — ABNORMAL LOW (ref 12.0–15.0)
MCH: 27.8 pg (ref 26.0–34.0)
Platelets: 254 10*3/uL (ref 150–400)
RBC: 3.7 MIL/uL — ABNORMAL LOW (ref 3.87–5.11)
WBC: 5.2 10*3/uL (ref 4.0–10.5)

## 2013-02-25 LAB — BASIC METABOLIC PANEL
CO2: 29 mEq/L (ref 19–32)
Calcium: 9.1 mg/dL (ref 8.4–10.5)
GFR calc non Af Amer: 63 mL/min — ABNORMAL LOW (ref >90)
Glucose, Bld: 93 mg/dL (ref 70–99)
Potassium: 3.7 mEq/L (ref 3.5–5.1)
Sodium: 139 mEq/L (ref 135–145)

## 2013-02-25 LAB — PROTIME-INR
INR: 1.63 — ABNORMAL HIGH (ref 0.00–1.49)
Prothrombin Time: 18.8 seconds — ABNORMAL HIGH (ref 11.6–15.2)

## 2013-02-25 MED ORDER — ISOSORBIDE MONONITRATE ER 30 MG PO TB24: 30.0000 mg | ORAL_TABLET | Freq: Every day | ORAL | Status: DC

## 2013-02-25 MED ORDER — DIGOXIN 125 MCG PO TABS: 0.1250 mg | ORAL_TABLET | Freq: Every day | ORAL | Status: DC

## 2013-02-25 NOTE — Discharge Summary (Signed)
Physician Discharge Summary  Mieko Kneebone ZOX:[id number] DOB: 10/30/1940 DOA: 02/24/2013  PCP: Default, Provider, MD  Admit date: 02/24/2013 Discharge date: 02/25/2013  Time spent: 65 minutes  Recommendations for Outpatient Follow-up:  1. Patient will be set up for outpatient event monitor. 2. Patient is to followup with her cardiologist, Dr. Gala Romney one week post discharge. 3. Patient is to followup in Coumadin clinic on Thursday, 02/27/2013.  Discharge Diagnoses:  Principal Problem:   Syncope Active Problems:   CAD (coronary artery disease)   Aneurysm of left ventricle of heart   Chronic systolic heart failure   Essential hypertension   Discharge Condition: Stable and improved  Diet recommendation: Heart healthy  Filed Weights   02/24/13 0651 02/25/13 0626  Weight: 55.7 kg (122 lb 12.7 oz) 55.5 kg (122 lb 5.7 oz)    History of present illness:  Faith Gray is a 72 y.o. female with history of chronic systolic heart failure last EF measured was 25-30%, LV aneurysm, CAD was brought to the ER after patient felt dizzy and fell. Patient states that since 9 PM last night she has been feeling dizzy and had gone to the bathroom but was unable to move bowels after which he stood up and felt more dizzy and fell. Her family heard a sound and went to see her immediately. She did not lose consciousness as per the patient and family. Patient denies any chest pain or shortness of breath did have mild nausea. Patient did not have any focal deficit but did have mild headache. In the ER patient was found to be not orthostatic. CT head is negative for any acute. Cardiac enzymes were negative and EKG shows inferior lead changes compatible to the old EKG. Patient has been admitted for further observation. Patient had not been taking her digoxin for last few days because patient was instructed to confirm with her cardiologist.   Hospital Course:  #1 syncope/Near syncope Patient on admission was  noted to present with some dizziness on standing and near-syncope/syncopal episode. Patient denied any chest pain or shortness of breath. Patient denied any sick contacts. Orthostatics were checked which were negative. Cardiac enzymes were cycled which were negative x3. CT of the head which was done was negative. EKG was unchanged from old EKG. It was noted that patient had not been taking her digoxin for the last few days as was unable to get it while awaiting confirmation from her cardiologist as well as Imdur. Patient was placed on telemetry. Carotid Dopplers which were done were negative for any significant ICA stenosis. Repeat 2-D echo which was done showed severe hypokinesis in the inferolateral base as well as akinesis in the basal mid inferior wall which were unchanged from prior 2-D echo. However EF had improved to 40-45%. There were no valvular abnormalities noted. Patient improved clinically did not have any further dizzy spells will be discharged in stable and improved condition. Patient will be set up for a 30 day outpatient event monitor for further evaluation and is to followup with her cardiologist one week post discharge. Patient be discharged in stable and improved condition.  #2 left ventricular aneurysm on chronic anticoagulation On admission patient was noted to have a subtherapeutic INR of 1.23. Patient was given a higher dose of Coumadin 12 mg x1 with an increase in INR to 1.63. Patient be discharged home on her home regimen of Coumadin and will need a PT INR checked on Thursday, 02/27/2013.  The rest of patient's chronic medical issues remains stable  throughout the hospitalization the patient discharged in stable and improved condition.  Procedures:  2-D echo 02/24/2013  Carotid Dopplers 02/24/2013  CT head 02/24/2013  Consultations:  None  Discharge Exam: Filed Vitals:   02/24/13 1611 02/24/13 1732 02/24/13 2100 02/25/13 0626  BP: 108/54 108/54 121/65 126/61  Pulse:    58 71  Temp:   97.8 F (36.6 C) 97 F (36.1 C)  TempSrc:   Oral Oral  Resp:   20 20  Height:      Weight:    55.5 kg (122 lb 5.7 oz)  SpO2:   99% 100%    General: NAD Cardiovascular: RRR Respiratory: CTAB  Discharge Instructions  Discharge Orders   Future Appointments Provider Department Dept Phone   03/04/2013 10:40 AM [facility] [phone]   Future Orders Complete By Expires     Diet - low sodium heart healthy  As directed     Discharge instructions  As directed     Comments:      Follow up with Dr Gala Romney in 1 week 03/04/13 at 1040AM Patient will be called for set up for event monitor. Follow up in coumadin clinic on Thursday 02/27/13    Increase activity slowly  As directed         Medication List    TAKE these medications       aspirin 81 MG EC tablet  Take 1 tablet (81 mg total) by mouth daily.     atorvastatin 40 MG tablet  Commonly known as:  LIPITOR  Take 1 tablet (40 mg total) by mouth daily at 6 PM.     carvedilol 3.125 MG tablet  Commonly known as:  COREG  Take 3.125 mg by mouth 2 (two) times daily with a meal.     digoxin 0.125 MG tablet  Commonly known as:  LANOXIN  Take 1 tablet (0.125 mg total) by mouth daily.     enalapril 5 MG tablet  Commonly known as:  VASOTEC  Take 5 mg by mouth 2 (two) times daily.     furosemide 40 MG tablet  Commonly known as:  LASIX  Take 1 tablet (40 mg total) by mouth daily.     hydrALAZINE 25 MG tablet  Commonly known as:  APRESOLINE  Take 1 tablet (25 mg total) by mouth 3 (three) times daily.     isosorbide mononitrate 30 MG 24 hr tablet  Commonly known as:  IMDUR  Take 1 tablet (30 mg total) by mouth daily.     warfarin 6 MG tablet  Commonly known as:  COUMADIN  Take 6-9 mg by mouth See admin instructions. Takes 1.5 tablet (9mg ) on Monday. Takes 1 tablet (6mg ) all other days.       No Known Allergies     Follow-up Information   Follow up  with Arvilla Meres, MD On 03/04/2013. (f/u at 1040AM)    Contact information:   [address] [place] [address] [phone]       Follow up On 02/27/2013. (f/u in coumadin clinic [place] on 02/27/13)        The results of significant diagnostics from this hospitalization (including imaging, microbiology, ancillary and laboratory) are listed below for reference.    Significant Diagnostic Studies: Ct Head Wo Contrast  02/24/2013   *RADIOLOGY REPORT*  Clinical Data: Frontal headaches.  Syncope.  The patient is on Coumadin.  CT HEAD WITHOUT CONTRAST  Technique:  Contiguous  axial images were obtained from the base of the skull through the vertex without contrast.  Comparison: None.  Findings: Mild cerebral atrophy.  No ventricular dilatation.  No mass effect or midline shift.  No abnormal extra-axial fluid collections.  Gray-white matter junctions are distinct.  Basal cisterns are not effaced.  No evidence of acute intracranial hemorrhage.  No depressed skull fractures.  Visualized paranasal sinuses and mastoid air cells are not opacified.  Vascular calcifications in the intracranial arteries.  IMPRESSION: No acute intracranial abnormalities.  Chronic atrophy.   Original Report Authenticated By: Burman Nieves, M.D.    Microbiology: No results found for this or any previous visit (from the past 240 hour(s)).   Labs: Basic Metabolic Panel:  Recent Labs Lab 02/24/13 0201 02/24/13 0641 02/25/13 0508  NA 138 139 139  K 3.4* 3.5 3.7  CL 100 103 105  CO2 31 27 29   GLUCOSE 111* 102* 93  BUN 12 14 14   CREATININE 0.96 0.96 0.90  CALCIUM 9.2 8.7 9.1  MG  --  1.8  --    Liver Function Tests:  Recent Labs Lab 02/24/13 0641  AST 18  ALT 15  ALKPHOS 134*  BILITOT 0.2*  PROT 6.9  ALBUMIN 3.2*   No results found for this basename: LIPASE, AMYLASE,  in the last 168 hours No results found for this basename: AMMONIA,  in the last 168 hours CBC:  Recent Labs Lab  02/24/13 0201 02/24/13 0641 02/25/13 0508  WBC 7.7 5.7 5.2  NEUTROABS 3.9 3.4  --   HGB 11.1* 9.9* 10.3*  HCT 32.8* 29.5* 30.7*  MCV 83.5 83.6 83.0  PLT 245 254 254   Cardiac Enzymes:  Recent Labs Lab 02/24/13 0644 02/24/13 1212 02/24/13 1842  TROPONINI <0.30 <0.30 <0.30   BNP: BNP (last 3 results)  Recent Labs  09/07/12 1957  PROBNP 4088.0*   CBG: No results found for this basename: GLUCAP,  in the last 168 hours     Signed:  Suzanne Garbers  [facility] 02/25/2013, 9:06 AM

## 2013-02-25 NOTE — Telephone Encounter (Signed)
Per Dr Gala Romney pt in hospital for syncope and being d/c'd today, he would like pt to have 30 day event monitor, order placed and sent to [facility]

## 2013-02-25 NOTE — Progress Notes (Signed)
All d/c instructions explained and given to pt.  Verbalized understanding.  Pt waiting for daughter to pick her up.  Amanda Pea, Charity fundraiser.

## 2013-03-04 ENCOUNTER — Ambulatory Visit (HOSPITAL_COMMUNITY): Payer: Medicare Other

## 2013-03-05 ENCOUNTER — Encounter (HOSPITAL_COMMUNITY): Payer: Medicare Other

## 2013-03-11 ENCOUNTER — Encounter: Payer: Self-pay | Admitting: *Deleted

## 2013-03-11 ENCOUNTER — Encounter (INDEPENDENT_AMBULATORY_CARE_PROVIDER_SITE_OTHER): Payer: Medicare Other

## 2013-03-11 DIAGNOSIS — R55 Syncope and collapse: Secondary | ICD-10-CM

## 2013-03-11 NOTE — Progress Notes (Signed)
Patient ID: Faith Gray, female   DOB: [birth year], 72 y.o.   MRN: [id number] E-Cardio verite 30 day telemetry monitor applied to patient.

## 2013-03-24 ENCOUNTER — Encounter (HOSPITAL_COMMUNITY): Payer: Self-pay

## 2013-03-24 ENCOUNTER — Ambulatory Visit (HOSPITAL_COMMUNITY)
Admission: RE | Admit: 2013-03-24 | Discharge: 2013-03-24 | Disposition: A | Discharge status: Home / Self Care | Payer: Medicare Other | Source: Ambulatory Visit | Attending: Internal Medicine | Admitting: Internal Medicine

## 2013-03-24 VITALS — BP 120/68 | HR 62 | Wt 124.8 lb

## 2013-03-24 DIAGNOSIS — Z79899 Other long term (current) drug therapy: Secondary | ICD-10-CM | POA: Insufficient documentation

## 2013-03-24 DIAGNOSIS — I252 Old myocardial infarction: Secondary | ICD-10-CM | POA: Insufficient documentation

## 2013-03-24 DIAGNOSIS — R55 Syncope and collapse: Secondary | ICD-10-CM

## 2013-03-24 DIAGNOSIS — I509 Heart failure, unspecified: Secondary | ICD-10-CM | POA: Insufficient documentation

## 2013-03-24 DIAGNOSIS — I5022 Chronic systolic (congestive) heart failure: Secondary | ICD-10-CM | POA: Insufficient documentation

## 2013-03-24 DIAGNOSIS — Z7982 Long term (current) use of aspirin: Secondary | ICD-10-CM | POA: Insufficient documentation

## 2013-03-24 DIAGNOSIS — Z7901 Long term (current) use of anticoagulants: Secondary | ICD-10-CM | POA: Insufficient documentation

## 2013-03-24 DIAGNOSIS — Z87891 Personal history of nicotine dependence: Secondary | ICD-10-CM | POA: Insufficient documentation

## 2013-03-24 MED ORDER — LOSARTAN POTASSIUM 25 MG PO TABS: 25.0000 mg | ORAL_TABLET | Freq: Every day | ORAL | Status: DC

## 2013-03-24 NOTE — Progress Notes (Signed)
Patient ID: Faith Gray, female   DOB: 1941/01/18, 72 y.o.   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   Discharged from [facility] 09/11/12 for new onset acute systolic heart failure. Echo on 12/23 EF 25-30% with diffuse hypokinesis. Mild MR, mildly dilated LA, PAPP 34 mmHg. Small pericardial effusion. She was started on coumadin on 12/24 due to aneurysmal dilation of the LV.   Admitted to [facility] 02/24/13-02/25/13 for near syncope. Work up negative. ECHO EF improved to 40-45%. Event monitor was recommended.   30 day Event monitor placed 03/11/13   She returns for follow up today.  Rarely SOB. Denies PND/Orthopnea. Complains of dry hacking cough. Weight at home 122-124 pounds. Complaint with medications. Stopped smoking in December 2013. She has difficulty with transportation and paying for medications. Following low salt diet.    ROS: All systems negative except as listed in HPI, PMH and Problem List.  Past Medical History  Diagnosis Date  . Shortness of breath   . CHF (congestive heart failure)   . Anemia   . CAD (coronary artery disease) 09/11/2012  . NSTEMI (non-ST elevated myocardial infarction) 09/11/2012  . Essential hypertension 10/04/2012    Current Outpatient Prescriptions  Medication Sig Dispense Refill  . aspirin EC 81 MG EC tablet Take 1 tablet (81 mg total) by mouth daily.  30 tablet  0  . atorvastatin (LIPITOR) 40 MG tablet Take 1 tablet (40 mg total) by mouth daily at 6 PM.  30 tablet  0  . carvedilol (COREG) 3.125 MG tablet Take 3.125 mg by mouth 2 (two) times daily with a meal.       . digoxin (LANOXIN) 0.125 MG tablet Take 1 tablet (0.125 mg total) by mouth daily.  30 tablet  0  . enalapril (VASOTEC) 5 MG tablet Take 5 mg by mouth 2 (two) times daily.       . furosemide (LASIX) 40 MG tablet Take 1 tablet (40 mg total) by mouth daily.  30 tablet  0  . hydrALAZINE (APRESOLINE) 25 MG tablet Take 1 tablet (25 mg total) by mouth 3 (three) times daily.      . isosorbide mononitrate (IMDUR) 30 MG 24 hr tablet Take 1 tablet (30 mg total) by mouth daily.  30 tablet  0  . warfarin (COUMADIN) 6 MG tablet Take 6-9 mg by mouth See admin instructions. Takes 1.5 tablet (9mg ) on Monday. Takes 1 tablet (6mg ) all other days.       No current facility-administered medications for this encounter.     PHYSICAL EXAM: Filed Vitals:   03/24/13 1435  BP: 120/68  Pulse: 62  Weight: 124 lb 12.8 oz (56.609 kg)  SpO2: 100%    General:  Well appearing. No resp difficulty Grandson present HEENT: normal Neck: supple. JVP flat. Carotids 2+ bilaterally; no bruits. No lymphadenopathy or thryomegaly appreciated. Cor: PMI normal. Regular rate & rhythm. No rubs, gallops or murmurs. Event monitor on.  Lungs: CTA Abdomen: soft, nontender, nondistended. No hepatosplenomegaly. No bruits or masses. Good bowel sounds. Extremities: no cyanosis, clubbing, rash, edema Neuro: alert & orientedx3, cranial nerves grossly intact. Moves all 4 extremities w/o difficulty. Affect pleasant.      ASSESSMENT & PLAN:

## 2013-03-24 NOTE — Assessment & Plan Note (Signed)
30 day event monitor placed 03/11/13. Dr Gala Romney to review at next OV.

## 2013-03-24 NOTE — Patient Instructions (Addendum)
Follow up in 6 weeks with Dr Gala Romney  Stop enalapril   Take Losartan 25 mg daily  Do the following things EVERYDAY: 1) Weigh yourself in the morning before breakfast. Write it down and keep it in a log. 2) Take your medicines as prescribed 3) Eat low salt foods-Limit salt (sodium) to 2000 mg per day.  4) Stay as active as you can everyday 5) Limit all fluids for the day to less than 2 liters

## 2013-03-24 NOTE — Assessment & Plan Note (Signed)
Reviewed discharge summary from 02/25/13. ECHO from 02/24/13 showed improving EF from 25% up to 40-45%.  NYHA I. Volume status stable. Continue current diuretic regimen. HR 62 continue current dose of carvedilol. Stop enalapril due to dry hacking cough. Start losartan 25 mg daily. Reinforced daily weights and medication compliance. Follow up in 6 weeks with Dr Gala Romney.

## 2013-05-01 ENCOUNTER — Ambulatory Visit (HOSPITAL_COMMUNITY)
Admission: RE | Admit: 2013-05-01 | Discharge: 2013-05-01 | Disposition: A | Discharge status: Home / Self Care | Payer: Medicare Other | Source: Ambulatory Visit | Attending: Internal Medicine | Admitting: Internal Medicine

## 2013-05-01 VITALS — BP 116/62 | HR 77 | Wt 124.5 lb

## 2013-05-01 DIAGNOSIS — I2510 Atherosclerotic heart disease of native coronary artery without angina pectoris: Secondary | ICD-10-CM

## 2013-05-01 DIAGNOSIS — I253 Aneurysm of heart: Secondary | ICD-10-CM

## 2013-05-01 DIAGNOSIS — I252 Old myocardial infarction: Secondary | ICD-10-CM | POA: Insufficient documentation

## 2013-05-01 DIAGNOSIS — Z7901 Long term (current) use of anticoagulants: Secondary | ICD-10-CM | POA: Insufficient documentation

## 2013-05-01 DIAGNOSIS — I5022 Chronic systolic (congestive) heart failure: Secondary | ICD-10-CM

## 2013-05-01 DIAGNOSIS — I10 Essential (primary) hypertension: Secondary | ICD-10-CM | POA: Insufficient documentation

## 2013-05-01 DIAGNOSIS — Z7982 Long term (current) use of aspirin: Secondary | ICD-10-CM | POA: Insufficient documentation

## 2013-05-01 DIAGNOSIS — I509 Heart failure, unspecified: Secondary | ICD-10-CM | POA: Insufficient documentation

## 2013-05-01 LAB — BASIC METABOLIC PANEL
CO2: 27 mEq/L (ref 19–32)
Calcium: 9.8 mg/dL (ref 8.4–10.5)
Creatinine, Ser: 0.91 mg/dL (ref 0.50–1.10)
GFR calc non Af Amer: 62 mL/min — ABNORMAL LOW (ref >90)
Glucose, Bld: 104 mg/dL — ABNORMAL HIGH (ref 70–99)

## 2013-05-01 MED ORDER — CARVEDILOL 6.25 MG PO TABS: 6.2500 mg | ORAL_TABLET | Freq: Two times a day (BID) | ORAL | Status: DC

## 2013-05-01 NOTE — Patient Instructions (Addendum)
Increase carvedilol to 6.25 mg twice a day.  Doing great.  Follow up 2-3 months.

## 2013-05-03 NOTE — Progress Notes (Signed)
Patient ID: Faith Gray, female   DOB: 1940/11/27, 72 y.o.   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 Echo showed EF 25-30% with diffuse hypokinesis. Mild MR.  Cath 12/13:  Left main: Normal  LAD: Gives off 3 diagonals. Mild diffuse plaquing with 30-40% lesion in midsection. Otherwise normal.  LCX: Tiny ramus. Large OM-1 & OM-2. 30% lesion in ostial LCX. Otherwise mild plaque.  RCA: Dominant vessel with diffuse disease. Diffuse 30-40% through midsection. In distal RCA there is a ruptured plaque with about 60% stenosis. PDA moderate diffuse disease. In distal RCA just prior to take-off of PLs there is a very focal area of myocardial bridging with severe systolic compression.  LV-gram done in the RAO projection: Ejection fraction = 25-30%. There is a large aneurysm at the base to mid inferior wall. Anterior wall mild HK. No significant MR noted.  Started on coumadin due to large inferior LV aneurysm.  Admitted to [facility] 02/24/13-02/25/13 for near syncope after taking hydralazine early and jumping up quickly. Work up negative. ECHO EF improved to 40-45%, Severe hypokinesis base inferolateral. Akinesis base/mid inferior wall. 30 day Event monitor placed 03/11/13. Results not in computer but she said no one has called her with any abnormal rhythms.   Follow up: Last visit switched from enalapril to losartan d/t cough. No longer complaing of cough and tolerating medication fine. Feeling pretty good. Occasional dizziness. Very seldom SOB with exertion. Denies PND/Orthopnea. Weight at home 124-127 pounds. Taking medications as prescribed, following a low salt diet and drinking less than 2L a day.    ROS: All systems negative except as  listed in HPI, PMH and Problem List.  Past Medical History  Diagnosis Date  . Shortness of breath   . CHF (congestive heart failure)   . Anemia   . CAD (coronary artery disease) 09/11/2012  . NSTEMI (non-ST elevated myocardial infarction) 09/11/2012  . Essential hypertension 10/04/2012    Current Outpatient Prescriptions  Medication Sig Dispense Refill  . aspirin EC 81 MG EC tablet Take 1 tablet (81 mg total) by mouth daily.  30 tablet  0  . atorvastatin (LIPITOR) 40 MG tablet Take 1 tablet (40 mg total) by mouth daily at 6 PM.  30 tablet  0  . carvedilol (COREG) 6.25 MG tablet Take 1 tablet (6.25 mg total) by mouth 2 (two) times daily with a meal.  60 tablet  3  . digoxin (LANOXIN) 0.125 MG tablet Take 1 tablet (0.125 mg total) by mouth daily.  30 tablet  0  . furosemide (LASIX) 40 MG tablet Take 1 tablet (40 mg total) by mouth daily.  30 tablet  0  . hydrALAZINE (APRESOLINE) 25 MG tablet Take 1 tablet (25 mg total) by mouth 3 (three) times daily.      . isosorbide mononitrate (IMDUR) 30 MG 24 hr tablet Take 1 tablet (30 mg total) by mouth daily.  30 tablet  0  . losartan (COZAAR) 25 MG tablet Take 1 tablet (25 mg total) by mouth daily.  30 tablet  3  . warfarin (COUMADIN) 6 MG tablet Take 6-9 mg by mouth See admin instructions. Takes 1.5 tablet (9mg ) on Monday. Takes 1 tablet (  6mg ) all other days.       No current facility-administered medications for this encounter.    PHYSICAL EXAM: Filed Vitals:   05/01/13 1436  BP: 116/62  Pulse: 77  Weight: 124 lb 8 oz (56.473 kg)  SpO2: 100%  Last weight: 124  General:  Well appearing. No resp difficulty Grandson present HEENT: normal Neck: supple. JVP flat. Carotids 2+ bilaterally; no bruits. No lymphadenopathy or thryomegaly appreciated. Cor: PMI normal. Regular rate & rhythm. No rubs, gallops or murmurs. Event monitor on.  Lungs: CTA Abdomen: soft, nontender, nondistended. No hepatosplenomegaly. No bruits or masses. Good bowel  sounds. Extremities: no cyanosis, clubbing, rash, edema Neuro: alert & orientedx3, cranial nerves grossly intact. Moves all 4 extremities w/o difficulty. Affect pleasant.   ASSESSMENT & PLAN:  1) Chronic systolic HF, ICM EF 40-45% (02/2013) - NYHA II symptoms. Volume status good. - Will increase coreg to 6.25 mg BID, if she notices any dizziness give [place] a call. - BMET today - Follow up 2-3 months  2) CAD - no ischemic s/s - continue ASA, statin, and BB  3) LV aneurysm - felt to be high-risk for embolic phenomenon -> continue coumadin.   [name] 5:17 PM

## 2013-05-29 ENCOUNTER — Ambulatory Visit (INDEPENDENT_AMBULATORY_CARE_PROVIDER_SITE_OTHER): Payer: Medicare Other | Admitting: *Deleted

## 2013-05-29 DIAGNOSIS — I253 Aneurysm of heart: Secondary | ICD-10-CM

## 2013-05-29 DIAGNOSIS — Z7901 Long term (current) use of anticoagulants: Secondary | ICD-10-CM

## 2013-05-29 LAB — POCT INR: INR: 1.6

## 2013-06-05 ENCOUNTER — Ambulatory Visit (INDEPENDENT_AMBULATORY_CARE_PROVIDER_SITE_OTHER): Payer: Medicare Other | Admitting: *Deleted

## 2013-06-05 DIAGNOSIS — I253 Aneurysm of heart: Secondary | ICD-10-CM

## 2013-06-05 DIAGNOSIS — Z7901 Long term (current) use of anticoagulants: Secondary | ICD-10-CM

## 2013-06-05 MED ORDER — WARFARIN SODIUM 6 MG PO TABS: ORAL_TABLET | ORAL | Status: DC

## 2013-06-20 ENCOUNTER — Other Ambulatory Visit (HOSPITAL_COMMUNITY): Payer: Self-pay | Admitting: *Deleted

## 2013-06-20 MED ORDER — FUROSEMIDE 40 MG PO TABS: 40.0000 mg | ORAL_TABLET | Freq: Every day | ORAL | Status: DC

## 2013-06-20 MED ORDER — LOSARTAN POTASSIUM 25 MG PO TABS: 25.0000 mg | ORAL_TABLET | Freq: Every day | ORAL | Status: DC

## 2013-06-20 MED ORDER — ATORVASTATIN CALCIUM 40 MG PO TABS: 40.0000 mg | ORAL_TABLET | Freq: Every day | ORAL | Status: DC

## 2013-06-20 MED ORDER — ISOSORBIDE MONONITRATE ER 30 MG PO TB24: 30.0000 mg | ORAL_TABLET | Freq: Every day | ORAL | Status: DC

## 2013-06-20 MED ORDER — DIGOXIN 125 MCG PO TABS: 0.1250 mg | ORAL_TABLET | Freq: Every day | ORAL | Status: DC

## 2013-06-20 NOTE — Telephone Encounter (Signed)
Medications called in and she was put in for a recall appointment for Nov 2014

## 2013-07-04 ENCOUNTER — Other Ambulatory Visit: Payer: Self-pay | Admitting: Cardiovascular Disease

## 2013-07-09 ENCOUNTER — Ambulatory Visit (INDEPENDENT_AMBULATORY_CARE_PROVIDER_SITE_OTHER): Payer: Medicare Other | Admitting: *Deleted

## 2013-07-09 DIAGNOSIS — I253 Aneurysm of heart: Secondary | ICD-10-CM

## 2013-07-09 DIAGNOSIS — Z7901 Long term (current) use of anticoagulants: Secondary | ICD-10-CM

## 2013-07-15 ENCOUNTER — Encounter (HOSPITAL_COMMUNITY): Payer: Self-pay | Admitting: Cardiology

## 2013-07-15 ENCOUNTER — Telehealth (HOSPITAL_COMMUNITY): Payer: Self-pay | Admitting: Cardiology

## 2013-07-15 NOTE — Telephone Encounter (Signed)
Attempting to schedule 3 month follow up I have been unable to reach this patient by phone.  A letter is being sent to the last known home address.  

## 2013-07-23 ENCOUNTER — Ambulatory Visit (INDEPENDENT_AMBULATORY_CARE_PROVIDER_SITE_OTHER): Payer: Medicare Other | Admitting: Pharmacist

## 2013-07-23 DIAGNOSIS — Z7901 Long term (current) use of anticoagulants: Secondary | ICD-10-CM

## 2013-07-23 DIAGNOSIS — I253 Aneurysm of heart: Secondary | ICD-10-CM

## 2013-07-23 MED ORDER — WARFARIN SODIUM 6 MG PO TABS: ORAL_TABLET | ORAL | Status: DC

## 2013-08-06 ENCOUNTER — Ambulatory Visit (INDEPENDENT_AMBULATORY_CARE_PROVIDER_SITE_OTHER): Payer: Medicare Other | Admitting: Pharmacist

## 2013-08-06 DIAGNOSIS — Z7901 Long term (current) use of anticoagulants: Secondary | ICD-10-CM

## 2013-08-06 DIAGNOSIS — I253 Aneurysm of heart: Secondary | ICD-10-CM

## 2013-08-06 LAB — POCT INR: INR: 2.5

## 2013-08-18 ENCOUNTER — Encounter (HOSPITAL_COMMUNITY): Payer: Medicare Other

## 2013-08-21 ENCOUNTER — Ambulatory Visit (HOSPITAL_COMMUNITY)
Admission: RE | Admit: 2013-08-21 | Discharge: 2013-08-21 | Disposition: A | Discharge status: Home / Self Care | Payer: Medicare Other | Source: Ambulatory Visit | Attending: Cardiology | Admitting: Cardiology

## 2013-08-21 ENCOUNTER — Encounter (HOSPITAL_COMMUNITY): Payer: Self-pay

## 2013-08-21 VITALS — BP 138/80 | HR 76 | Ht 62.0 in | Wt 128.8 lb

## 2013-08-21 DIAGNOSIS — I2589 Other forms of chronic ischemic heart disease: Secondary | ICD-10-CM | POA: Insufficient documentation

## 2013-08-21 DIAGNOSIS — I10 Essential (primary) hypertension: Secondary | ICD-10-CM | POA: Insufficient documentation

## 2013-08-21 DIAGNOSIS — I2510 Atherosclerotic heart disease of native coronary artery without angina pectoris: Secondary | ICD-10-CM

## 2013-08-21 DIAGNOSIS — I509 Heart failure, unspecified: Secondary | ICD-10-CM | POA: Insufficient documentation

## 2013-08-21 DIAGNOSIS — I5022 Chronic systolic (congestive) heart failure: Secondary | ICD-10-CM | POA: Insufficient documentation

## 2013-08-21 DIAGNOSIS — I253 Aneurysm of heart: Secondary | ICD-10-CM

## 2013-08-21 DIAGNOSIS — Z79899 Other long term (current) drug therapy: Secondary | ICD-10-CM | POA: Insufficient documentation

## 2013-08-21 DIAGNOSIS — I252 Old myocardial infarction: Secondary | ICD-10-CM | POA: Insufficient documentation

## 2013-08-21 LAB — LIPID PANEL
Cholesterol: 179 mg/dL (ref 0–200)
HDL: 56 mg/dL (ref >39)
Total CHOL/HDL Ratio: 3.2 RATIO

## 2013-08-21 LAB — BASIC METABOLIC PANEL
CO2: 28 mEq/L (ref 19–32)
Chloride: 102 mEq/L (ref 96–112)
Sodium: 138 mEq/L (ref 135–145)

## 2013-08-21 LAB — DIGOXIN LEVEL: Digoxin Level: 1.2 ng/mL (ref 0.8–2.0)

## 2013-08-21 NOTE — Patient Instructions (Signed)
Increase Carvedilol to 1 & 1/2 tabs Twice daily for 1 week Then start new prescription for 12.5 mg Twice daily   Stop Aspirin  Labs today  We will contact you in 3 months to schedule your next appointment.

## 2013-08-22 NOTE — Progress Notes (Signed)
Patient ID: Faith Gray, female   DOB: [birth year], 72 y.o.   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    Admitted to [facility] 1213 for new onset acute systolic heart failure. Echo showed EF 25-30% with diffuse hypokinesis. Mild MR.   Cath 12/13:  Left main: Normal  LAD: Gives off 3 diagonals. Mild diffuse plaquing with 30-40% lesion in midsection. Otherwise normal.  LCX: Tiny ramus. Large OM-1 & OM-2. 30% lesion in ostial LCX. Otherwise mild plaque.  RCA: Dominant vessel with diffuse disease. Diffuse 30-40% through midsection. In distal RCA there is a ruptured plaque with about 60% stenosis. PDA moderate diffuse disease. In distal RCA just prior to take-off of PLs there is a very focal area of myocardial bridging with severe systolic compression.  LV-gram done in the RAO projection: Ejection fraction = 25-30%. There is a large aneurysm at the base to mid inferior wall. Anterior wall mild HK. No significant MR noted.  Started on coumadin due to large inferior LV aneurysm.  Admitted to [facility] 02/24/13-02/25/13 for near syncope after taking hydralazine early and jumping up quickly. Work up negative. ECHO EF improved to 40-45%, Severe hypokinesis base inferolateral. Akinesis base/mid inferior wall. 30 day Event monitor placed 03/11/13. This showed no significant arrhythmias.   Follow up: Doing well overall.  Some fatigue, but she is able to do housework like washing windows with no problems.  No chest pain.  No significant exertional dyspnea.  She is now on an ARB due to ACEI cough.     Labs (8/14): K 4, creatinine 0.91  ROS: All systems negative except as listed in HPI, PMH and Problem List.  Past Medical History  Diagnosis Date  . Shortness of breath   . CHF (congestive  heart failure)   . Anemia   . CAD (coronary artery disease) 09/11/2012  . NSTEMI (non-ST elevated myocardial infarction) 09/11/2012  . Essential hypertension 10/04/2012    Current Outpatient Prescriptions  Medication Sig Dispense Refill  . atorvastatin (LIPITOR) 40 MG tablet Take 1 tablet (40 mg total) by mouth daily at 6 PM.  30 tablet  2  . carvedilol (COREG) 6.25 MG tablet Take 1 tablet (6.25 mg total) by mouth 2 (two) times daily with a meal.  60 tablet  3  . digoxin (LANOXIN) 0.125 MG tablet Take 1 tablet (0.125 mg total) by mouth daily.  30 tablet  2          . furosemide (LASIX) 40 MG tablet Take 1 tablet (40 mg total) by mouth daily.  30 tablet  2  . hydrALAZINE (APRESOLINE) 25 MG tablet Take 1 tablet (25 mg total) by mouth 3 (three) times daily.      . isosorbide mononitrate (IMDUR) 30 MG 24 hr tablet Take 1 tablet (30 mg total) by mouth daily.  30 tablet  2  . losartan (COZAAR) 25 MG tablet Take 1 tablet (25 mg total) by mouth daily.  30 tablet  2  . warfarin (COUMADIN) 6 MG tablet TAKE 1 TABLET (6 MG TOTAL) BY MOUTH AS DIRECTED.  45 tablet  0   No current facility-administered medications for this encounter.    PHYSICAL EXAM: Filed Vitals:   08/21/13 1117  BP: 138/80  Pulse: 76  Height:  5\' 2"  (1.575 m)  Weight: 128 lb 12.8 oz (58.423 kg)  SpO2: 98%  Last weight: 124  General:  Well appearing. No resp difficulty  HEENT: normal Neck: supple. JVP flat. Carotids 2+ bilaterally; no bruits. No lymphadenopathy or thryomegaly appreciated. Cor: PMI normal. Regular rate & rhythm. No rubs, gallops or murmurs. Event monitor on.  Lungs: CTA Abdomen: soft, nontender, nondistended. No hepatosplenomegaly. No bruits or masses. Good bowel sounds. Extremities: no cyanosis, clubbing, rash, edema Neuro: alert & orientedx3, cranial nerves grossly intact. Moves all 4 extremities w/o difficulty. Affect pleasant.   ASSESSMENT & PLAN:  1) Chronic systolic HF: Ischemic cardiomyopathy, EF  40-45% in 6/14.  She has an inferior wall aneurysm.  NYHA II symptoms. Volume status looks good. - Increase Coreg to 9.375 mg bid x 1 week, then up to 12.5 mg bid.  - Continue digoxin, losartan, Lasix, and hydralazine/Imdur.  - Check BMET and digoxin level today.  2) CAD: No ischemic symptoms.  - Can stop ASA as she is also on warfarin and has stable CAD.  - Will check lipids today, she is on a statin.  3) LV aneurysm:  Felt to be high-risk for embolic phenomenon -> continue coumadin.   Faith McLean,MD 08/22/2013

## 2013-08-27 ENCOUNTER — Telehealth (HOSPITAL_COMMUNITY): Payer: Self-pay | Admitting: *Deleted

## 2013-08-27 MED ORDER — ATORVASTATIN CALCIUM 80 MG PO TABS: 80.0000 mg | ORAL_TABLET | Freq: Every day | ORAL | Status: DC

## 2013-08-27 MED ORDER — DIGOXIN 125 MCG PO TABS: 0.0625 mg | ORAL_TABLET | Freq: Every day | ORAL | Status: DC

## 2013-08-27 NOTE — Telephone Encounter (Signed)
Message copied by Noralee Space on Wed Aug 27, 2013  3:10 PM ------      Message from: Laurey Morale      Created: Fri Aug 22, 2013 11:57 AM       Decrease digoxin to 1/2 tablet daily (0.0625 mg) and increase atorvastatin to 80 mg daily for goal LDL < 70.  Needs lipids/LFTs in 2 months. ------

## 2013-08-28 ENCOUNTER — Ambulatory Visit (INDEPENDENT_AMBULATORY_CARE_PROVIDER_SITE_OTHER): Payer: Medicare Other | Admitting: *Deleted

## 2013-08-28 DIAGNOSIS — I253 Aneurysm of heart: Secondary | ICD-10-CM

## 2013-08-28 DIAGNOSIS — Z7901 Long term (current) use of anticoagulants: Secondary | ICD-10-CM

## 2013-08-28 MED ORDER — WARFARIN SODIUM 6 MG PO TABS: ORAL_TABLET | ORAL | Status: DC

## 2013-09-19 ENCOUNTER — Ambulatory Visit (INDEPENDENT_AMBULATORY_CARE_PROVIDER_SITE_OTHER): Payer: Medicare Other | Admitting: Pharmacist

## 2013-09-19 DIAGNOSIS — I253 Aneurysm of heart: Secondary | ICD-10-CM

## 2013-09-19 DIAGNOSIS — Z7901 Long term (current) use of anticoagulants: Secondary | ICD-10-CM

## 2013-09-19 LAB — POCT INR: INR: 3.5

## 2013-10-06 ENCOUNTER — Ambulatory Visit (INDEPENDENT_AMBULATORY_CARE_PROVIDER_SITE_OTHER): Payer: Medicare Other | Admitting: *Deleted

## 2013-10-06 DIAGNOSIS — I253 Aneurysm of heart: Secondary | ICD-10-CM

## 2013-10-06 DIAGNOSIS — Z7901 Long term (current) use of anticoagulants: Secondary | ICD-10-CM

## 2013-10-06 LAB — POCT INR: INR: 2.4

## 2013-10-06 MED ORDER — WARFARIN SODIUM 6 MG PO TABS: ORAL_TABLET | ORAL | Status: DC

## 2013-10-08 ENCOUNTER — Other Ambulatory Visit (HOSPITAL_COMMUNITY): Payer: Self-pay | Admitting: Cardiology

## 2013-10-08 DIAGNOSIS — I509 Heart failure, unspecified: Secondary | ICD-10-CM

## 2013-10-08 MED ORDER — FUROSEMIDE 40 MG PO TABS: 40.0000 mg | ORAL_TABLET | Freq: Every day | ORAL | Status: DC

## 2013-10-08 MED ORDER — ATORVASTATIN CALCIUM 80 MG PO TABS: 80.0000 mg | ORAL_TABLET | Freq: Every day | ORAL | Status: DC

## 2013-10-08 MED ORDER — ISOSORBIDE MONONITRATE ER 30 MG PO TB24: 30.0000 mg | ORAL_TABLET | Freq: Every day | ORAL | Status: DC

## 2013-10-22 ENCOUNTER — Encounter (HOSPITAL_COMMUNITY): Payer: Self-pay | Admitting: Emergency Medicine

## 2013-10-22 ENCOUNTER — Emergency Department (INDEPENDENT_AMBULATORY_CARE_PROVIDER_SITE_OTHER)
Admission: EM | Admit: 2013-10-22 | Discharge: 2013-10-22 | Disposition: A | Discharge status: Home / Self Care | Payer: Medicare Other | Source: Home / Self Care

## 2013-10-22 ENCOUNTER — Emergency Department (INDEPENDENT_AMBULATORY_CARE_PROVIDER_SITE_OTHER): Payer: Medicare Other

## 2013-10-22 ENCOUNTER — Other Ambulatory Visit (HOSPITAL_COMMUNITY): Payer: Self-pay | Admitting: Cardiology

## 2013-10-22 DIAGNOSIS — R10811 Right upper quadrant abdominal tenderness: Secondary | ICD-10-CM

## 2013-10-22 DIAGNOSIS — R141 Gas pain: Secondary | ICD-10-CM

## 2013-10-22 DIAGNOSIS — R142 Eructation: Secondary | ICD-10-CM

## 2013-10-22 DIAGNOSIS — I5021 Acute systolic (congestive) heart failure: Secondary | ICD-10-CM

## 2013-10-22 DIAGNOSIS — R143 Flatulence: Secondary | ICD-10-CM

## 2013-10-22 MED ORDER — DIGOXIN 125 MCG PO TABS: 0.0625 mg | ORAL_TABLET | Freq: Every day | ORAL | Status: DC

## 2013-10-22 MED ORDER — CARVEDILOL 6.25 MG PO TABS: 6.2500 mg | ORAL_TABLET | Freq: Two times a day (BID) | ORAL | Status: DC

## 2013-10-22 MED ORDER — POLYETHYLENE GLYCOL 3350 17 GM/SCOOP PO POWD: 17.0000 g | Freq: Every day | ORAL | Status: DC

## 2013-10-22 NOTE — Addendum Note (Signed)
Addended by: Sevastian Witczak, Sharlot Gowda on: 10/22/2013 05:02 PM   Modules accepted: Orders

## 2013-10-22 NOTE — ED Notes (Signed)
reported extensive vomiting Sunday w residual lower abdominal pain. Denies diarrhea .

## 2013-10-22 NOTE — Discharge Instructions (Signed)
Abdominal Pain, Women °Abdominal (stomach, pelvic, or belly) pain can be caused by many things. It is important to tell your doctor: °· The location of the pain. °· Does it come and go or is it present all the time? °· Are there things that start the pain (eating certain foods, exercise)? °· Are there other symptoms associated with the pain (fever, nausea, vomiting, diarrhea)? °All of this is helpful to know when trying to find the cause of the pain. °CAUSES  °· Stomach: virus or bacteria infection, or ulcer. °· Intestine: appendicitis (inflamed appendix), regional ileitis (Crohn's disease), ulcerative colitis (inflamed colon), irritable bowel syndrome, diverticulitis (inflamed diverticulum of the colon), or cancer of the stomach or intestine. °· Gallbladder disease or stones in the gallbladder. °· Kidney disease, kidney stones, or infection. °· Pancreas infection or cancer. °· Fibromyalgia (pain disorder). °· Diseases of the female organs: °· Uterus: fibroid (non-cancerous) tumors or infection. °· Fallopian tubes: infection or tubal pregnancy. °· Ovary: cysts or tumors. °· Pelvic adhesions (scar tissue). °· Endometriosis (uterus lining tissue growing in the pelvis and on the pelvic organs). °· Pelvic congestion syndrome (female organs filling up with blood just before the menstrual period). °· Pain with the menstrual period. °· Pain with ovulation (producing an egg). °· Pain with an IUD (intrauterine device, birth control) in the uterus. °· Cancer of the female organs. °· Functional pain (pain not caused by a disease, may improve without treatment). °· Psychological pain. °· Depression. °DIAGNOSIS  °Your doctor will decide the seriousness of your pain by doing an examination. °· Blood tests. °· X-rays. °· Ultrasound. °· CT scan (computed tomography, special type of X-ray). °· MRI (magnetic resonance imaging). °· Cultures, for infection. °· Barium enema (dye inserted in the large intestine, to better view it with  X-rays). °· Colonoscopy (looking in intestine with a lighted tube). °· Laparoscopy (minor surgery, looking in abdomen with a lighted tube). °· Major abdominal exploratory surgery (looking in abdomen with a large incision). °TREATMENT  °The treatment will depend on the cause of the pain.  °· Many cases can be observed and treated at home. °· Over-the-counter medicines recommended by your caregiver. °· Prescription medicine. °· Antibiotics, for infection. °· Birth control pills, for painful periods or for ovulation pain. °· Hormone treatment, for endometriosis. °· Nerve blocking injections. °· Physical therapy. °· Antidepressants. °· Counseling with a psychologist or psychiatrist. °· Minor or major surgery. °HOME CARE INSTRUCTIONS  °· Do not take laxatives, unless directed by your caregiver. °· Take over-the-counter pain medicine only if ordered by your caregiver. Do not take aspirin because it can cause an upset stomach or bleeding. °· Try a clear liquid diet (broth or water) as ordered by your caregiver. Slowly move to a bland diet, as tolerated, if the pain is related to the stomach or intestine. °· Have a thermometer and take your temperature several times a day, and record it. °· Bed rest and sleep, if it helps the pain. °· Avoid sexual intercourse, if it causes pain. °· Avoid stressful situations. °· Keep your follow-up appointments and tests, as your caregiver orders. °· If the pain does not go away with medicine or surgery, you may try: °· Acupuncture. °· Relaxation exercises (yoga, meditation). °· Group therapy. °· Counseling. °SEEK MEDICAL CARE IF:  °· You notice certain foods cause stomach pain. °· Your home care treatment is not helping your pain. °· You need stronger pain medicine. °· You want your IUD removed. °· You feel faint or   lightheaded. °· You develop nausea and vomiting. °· You develop a rash. °· You are having side effects or an allergy to your medicine. °SEEK IMMEDIATE MEDICAL CARE IF:  °· Your  pain does not go away or gets worse. °· You have a fever. °· Your pain is felt only in portions of the abdomen. The right side could possibly be appendicitis. The left lower portion of the abdomen could be colitis or diverticulitis. °· You are passing blood in your stools (bright red or black tarry stools, with or without vomiting). °· You have blood in your urine. °· You develop chills, with or without a fever. °· You pass out. °MAKE SURE YOU:  °· Understand these instructions. °· Will watch your condition. °· Will get help right away if you are not doing well or get worse. °Document Released: 07/02/2007 Document Revised: 11/27/2011 Document Reviewed: 07/22/2009 °Exit[employer]® Patient Information ©2014 [employer]. ° °

## 2013-10-22 NOTE — ED Provider Notes (Signed)
CSN: [id number]     Arrival date & time 10/22/13  1423 History   First MD Initiated Contact with Patient 10/22/13 1459     Chief Complaint  Patient presents with  . Abdominal Pain   (Consider location/radiation/quality/duration/timing/severity/associated sxs/prior Treatment) HPI Comments: 73 year old female presents with abdominal pain for 3 days. She states that prior to the abdominal pain she been eating more sweets than usual and she knows that eating sweets causes of abdominal pain. The pain is across the abdomen but worse in the right upper quadrant. 3 days ago she had vomitin most of the day but only 3 times. No vomiting since. Her primary complaint today is the abdominal pain. Denies having fever, upper respiratory congestion, diarrhea, chest pain or shortness of breath.  Has a history of hypertension, CHF, anemia and a NSTEMI.   Past Medical History  Diagnosis Date  . Shortness of breath   . CHF (congestive heart failure)   . Anemia   . CAD (coronary artery disease) 09/11/2012  . NSTEMI (non-ST elevated myocardial infarction) 09/11/2012  . Essential hypertension 10/04/2012   Past Surgical History  Procedure Laterality Date  . Cardiac catheterization     History reviewed. No pertinent family history. History  Substance Use Topics  . Smoking status: Former Smoker -- 0.50 packs/day for 20 years    Types: Cigarettes  . Smokeless tobacco: Former Systems developer  . Alcohol Use: No   OB History   Grav Para Term Preterm Abortions TAB SAB Ect Mult Living                 Review of Systems  Constitutional: Positive for activity change and appetite change. Negative for fever and diaphoresis.  HENT: Negative.   Respiratory: Negative.   Cardiovascular: Negative.   Gastrointestinal: Positive for vomiting and abdominal pain. Negative for nausea, diarrhea, constipation, blood in stool, abdominal distention and anal bleeding.  Genitourinary: Negative.   Skin: Negative for pallor and rash.   Neurological: Negative.     Allergies  Review of patient's allergies indicates no known allergies.  Home Medications   Current Outpatient Rx  Name  Route  Sig  Dispense  Refill  . aspirin 81 MG tablet   Oral   Take 81 mg by mouth daily.         [name] atorvastatin (LIPITOR) 80 MG tablet   Oral   Take 1 tablet (80 mg total) by mouth daily at 6 PM.   30 tablet   2     Heart Failure Assistance Program   . carvedilol (COREG) 6.25 MG tablet   Oral   Take 1 tablet (6.25 mg total) by mouth 2 (two) times daily with a meal.   60 tablet   3   . digoxin (LANOXIN) 0.125 MG tablet   Oral   Take 0.5 tablets (0.0625 mg total) by mouth daily.   30 tablet   2   . enalapril (VASOTEC) 10 MG tablet   Oral   Take 10 mg by mouth 2 (two) times daily.         . furosemide (LASIX) 40 MG tablet   Oral   Take 1 tablet (40 mg total) by mouth daily.   30 tablet   2     Heart Failure Assistance Program   . hydrALAZINE (APRESOLINE) 25 MG tablet   Oral   Take 1 tablet (25 mg total) by mouth 3 (three) times daily.         . isosorbide mononitrate (  IMDUR) 30 MG 24 hr tablet   Oral   Take 1 tablet (30 mg total) by mouth daily.   30 tablet   2     Heart Failure Assistance Program   . losartan (COZAAR) 25 MG tablet   Oral   Take 1 tablet (25 mg total) by mouth daily.   30 tablet   2   . polyethylene glycol powder (GLYCOLAX/MIRALAX) powder   Oral   Take 17 g by mouth daily.   255 g   0   . warfarin (COUMADIN) 6 MG tablet      Take as directed by coumadin clinic   45 tablet   1    BP 132/79  Pulse 78  Temp(Src) 98 F (36.7 C) (Oral)  Resp 16  SpO2 100% Physical Exam  Nursing note and vitals reviewed. Constitutional: She is oriented to person, place, and time. She appears well-developed and well-nourished. No distress.  Neck: Normal range of motion. Neck supple.  Cardiovascular: Normal rate, regular rhythm and normal heart sounds.   No murmur  heard. Pulmonary/Chest: Effort normal and breath sounds normal. No respiratory distress. She has no wheezes. She has no rales.  Abdominal: Soft. She exhibits no distension and no mass. There is no rebound and no guarding.  BS hypoactive Tender RUQ, neg [place]. Mild tenderness across mid abdomen  Musculoskeletal: She exhibits no edema.  Neurological: She is alert and oriented to person, place, and time. She exhibits normal muscle tone.  Skin: Skin is warm and dry.  Psychiatric: She has a normal mood and affect.    ED Course  Procedures (including critical care time) Labs Review Labs Reviewed - No data to display Imaging Review Dg Abd 1 View  10/22/2013   CLINICAL DATA:  Abdominal pain  EXAM: ABDOMEN - 1 VIEW  COMPARISON:  None.  FINDINGS: Bowel gas pattern is unremarkable. No obstruction or free air is seen on this supine examination. There are multiple foci of arterial vascular calcification. There is extensive arthropathy in the lumbar spine. There are phleboliths in pelvis.  There is calcification in the mid right abdomen of uncertain etiology.  IMPRESSION: Extensive atherosclerotic change. Bowel gas pattern unremarkable. Calcification the mid right abdomen of uncertain etiology. A laminated gallstone is a differential consideration.   Electronically Signed   By: Lowella Grip M.D.   On: 10/22/2013 16:15      MDM   1. Abdominal gas pain   2. RUQ abdominal tenderness    No acute abdominal findings. Suspect gas pain. Mild RUQ tenderness, there is a gas pocket over this area and cholestatic dz is also a consideration. Lungs are clear no Rales. Normal vital signs, no edema no signs of CHF. The patient is in no distress and is stable. Recommend she follow up with PCP this week if possible or early next week. Discussed with her that if she develops fever, worsening pain, vomiting, trouble breathing or chest pain she stated emergency department. Patient states she understands and  agrees with plan.     Janne Napoleon, NP 10/22/13 [phone]

## 2013-10-29 ENCOUNTER — Ambulatory Visit (INDEPENDENT_AMBULATORY_CARE_PROVIDER_SITE_OTHER): Payer: Medicare Other | Admitting: Pharmacist

## 2013-10-29 DIAGNOSIS — Z7901 Long term (current) use of anticoagulants: Secondary | ICD-10-CM

## 2013-10-29 DIAGNOSIS — I253 Aneurysm of heart: Secondary | ICD-10-CM

## 2013-10-29 LAB — POCT INR: INR: 2.9

## 2013-10-29 NOTE — ED Provider Notes (Signed)
Medical screening examination/treatment/procedure(s) were performed by resident physician or non-physician practitioner and as supervising physician I was immediately available for consultation/collaboration.   Pauline Good MD.   Billy Fischer, MD 10/29/13 1800

## 2013-11-12 ENCOUNTER — Other Ambulatory Visit (HOSPITAL_COMMUNITY): Payer: Self-pay | Admitting: Cardiology

## 2013-11-12 DIAGNOSIS — I5021 Acute systolic (congestive) heart failure: Secondary | ICD-10-CM

## 2013-11-12 DIAGNOSIS — I509 Heart failure, unspecified: Secondary | ICD-10-CM

## 2013-11-12 MED ORDER — LOSARTAN POTASSIUM 25 MG PO TABS
25.0000 mg | ORAL_TABLET | Freq: Every day | ORAL | Status: DC
Start: 2013-11-12 — End: 2013-12-17

## 2013-11-12 MED ORDER — DIGOXIN 125 MCG PO TABS: 0.0625 mg | ORAL_TABLET | Freq: Every day | ORAL | Status: DC

## 2013-11-12 MED ORDER — ISOSORBIDE MONONITRATE ER 30 MG PO TB24: 30.0000 mg | ORAL_TABLET | Freq: Every day | ORAL | Status: DC

## 2013-11-26 ENCOUNTER — Ambulatory Visit (INDEPENDENT_AMBULATORY_CARE_PROVIDER_SITE_OTHER): Payer: Medicare Other | Admitting: *Deleted

## 2013-11-26 DIAGNOSIS — I253 Aneurysm of heart: Secondary | ICD-10-CM

## 2013-11-26 DIAGNOSIS — Z7901 Long term (current) use of anticoagulants: Secondary | ICD-10-CM

## 2013-11-26 LAB — POCT INR: INR: 2.5

## 2013-12-09 ENCOUNTER — Other Ambulatory Visit (HOSPITAL_COMMUNITY): Payer: Self-pay | Admitting: Cardiology

## 2013-12-09 DIAGNOSIS — I509 Heart failure, unspecified: Secondary | ICD-10-CM

## 2013-12-09 DIAGNOSIS — I5021 Acute systolic (congestive) heart failure: Secondary | ICD-10-CM

## 2013-12-09 MED ORDER — DIGOXIN 125 MCG PO TABS: 0.1250 mg | ORAL_TABLET | Freq: Every day | ORAL | Status: DC

## 2013-12-09 MED ORDER — FUROSEMIDE 40 MG PO TABS: 40.0000 mg | ORAL_TABLET | Freq: Every day | ORAL | Status: DC

## 2013-12-09 MED ORDER — CARVEDILOL 12.5 MG PO TABS: 12.5000 mg | ORAL_TABLET | Freq: Two times a day (BID) | ORAL | Status: DC

## 2013-12-12 ENCOUNTER — Other Ambulatory Visit (HOSPITAL_COMMUNITY): Payer: Self-pay | Admitting: Cardiology

## 2013-12-12 DIAGNOSIS — Z7901 Long term (current) use of anticoagulants: Secondary | ICD-10-CM

## 2013-12-12 MED ORDER — WARFARIN SODIUM 6 MG PO TABS
ORAL_TABLET | ORAL | Status: DC
Start: 2013-12-12 — End: 2014-01-07

## 2013-12-17 ENCOUNTER — Ambulatory Visit (HOSPITAL_COMMUNITY)
Admission: RE | Admit: 2013-12-17 | Discharge: 2013-12-17 | Disposition: A | Discharge status: Home / Self Care | Payer: Medicare Other | Source: Ambulatory Visit | Attending: Internal Medicine | Admitting: Internal Medicine

## 2013-12-17 VITALS — BP 130/86 | HR 55 | Wt 129.0 lb

## 2013-12-17 DIAGNOSIS — I5021 Acute systolic (congestive) heart failure: Secondary | ICD-10-CM

## 2013-12-17 DIAGNOSIS — I5022 Chronic systolic (congestive) heart failure: Secondary | ICD-10-CM

## 2013-12-17 DIAGNOSIS — I253 Aneurysm of heart: Secondary | ICD-10-CM

## 2013-12-17 DIAGNOSIS — I728 Aneurysm of other specified arteries: Secondary | ICD-10-CM | POA: Insufficient documentation

## 2013-12-17 DIAGNOSIS — I509 Heart failure, unspecified: Secondary | ICD-10-CM

## 2013-12-17 MED ORDER — HYDRALAZINE HCL 50 MG PO TABS: 50.0000 mg | ORAL_TABLET | Freq: Three times a day (TID) | ORAL | Status: DC

## 2013-12-17 MED ORDER — FUROSEMIDE 40 MG PO TABS
40.0000 mg | ORAL_TABLET | Freq: Every day | ORAL | Status: DC
Start: 2013-12-17 — End: 2014-01-14

## 2013-12-17 MED ORDER — LOSARTAN POTASSIUM 25 MG PO TABS: 25.0000 mg | ORAL_TABLET | Freq: Every day | ORAL | Status: DC

## 2013-12-17 MED ORDER — DIGOXIN 125 MCG PO TABS: 0.0625 mg | ORAL_TABLET | Freq: Every day | ORAL | Status: DC

## 2013-12-17 MED ORDER — CARVEDILOL 6.25 MG PO TABS: 9.3750 mg | ORAL_TABLET | Freq: Two times a day (BID) | ORAL | Status: DC

## 2013-12-17 MED ORDER — ISOSORBIDE MONONITRATE ER 30 MG PO TB24: 30.0000 mg | ORAL_TABLET | Freq: Every day | ORAL | Status: DC

## 2013-12-17 MED ORDER — ATORVASTATIN CALCIUM 80 MG PO TABS: 80.0000 mg | ORAL_TABLET | Freq: Every day | ORAL | Status: DC

## 2013-12-17 NOTE — Patient Instructions (Signed)
Follow up in 3 months with an ECHO  Take hydralazine 50 mg three times a day  Take carvedilol 9.375 mg twice a day which is 1 1/2 tabs twice a day  Do the following things EVERYDAY: 1) Weigh yourself in the morning before breakfast. Write it down and keep it in a log. 2) Take your medicines as prescribed 3) Eat low salt foods-Limit salt (sodium) to 2000 mg per day.  4) Stay as active as you can everyday 5) Limit all fluids for the day to less than 2 liters

## 2013-12-17 NOTE — Progress Notes (Signed)
Patient ID: Faith Gray, female   DOB: Jan 24, 1941, 73 y.o.   MRN: [id number]   Weight Range   125-127 pounds  Baseline proBNP   4000 09/07/12  PCP: None INR- [facility] HPI: Ms. 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    Admitted to [facility] 12/13 for new onset acute systolic heart failure. Echo showed EF 25-30% with diffuse hypokinesis. Mild MR.   Cath 12/13:  Left main: Normal  LAD: Gives off 3 diagonals. Mild diffuse plaquing with 30-40% lesion in midsection. Otherwise normal.  LCX: Tiny ramus. Large OM-1 & OM-2. 30% lesion in ostial LCX. Otherwise mild plaque.  RCA: Dominant vessel with diffuse disease. Diffuse 30-40% through midsection. In distal RCA there is a ruptured plaque with about 60% stenosis. PDA moderate diffuse disease. In distal RCA just prior to take-off of PLs there is a very focal area of myocardial bridging with severe systolic compression.  LV-gram done in the RAO projection: Ejection fraction = 25-30%. There is a large aneurysm at the base to mid inferior wall. Anterior wall mild HK. No significant MR noted.  Admitted to [facility] 02/24/13-02/25/13 for near syncope after taking hydralazine early and jumping up quickly. Work up negative. ECHO EF improved to 40-45%, Severe hypokinesis base inferolateral. Akinesis base/mid inferior wall. 30 day Event monitor placed 03/11/13. This showed no significant arrhythmias.   She returns for follow up. Last visit she was instructed to increase carvedilol to 12.5 mg twice a day but she continued 9.375 mg  twice a day. Mild SOB with exertion. Denies PND/Orthopnea. Able to walk 4 blocks.  Weight at home 127-128 pounds. Taking all medications.     Labs (8/14): K 4, creatinine 0.91 Labs (08/21/13): dig level 1.2 digoxin cut back to 0.0625 mg daily K 4.6 Creatinine 0.89   ROS: All  systems negative except as listed in HPI, PMH and Problem List.  Past Medical History  Diagnosis Date  . Shortness of breath   . CHF (congestive heart failure)   . Anemia   . CAD (coronary artery disease) 09/11/2012  . NSTEMI (non-ST elevated myocardial infarction) 09/11/2012  . Essential hypertension 10/04/2012      Medication List    ASK your doctor about these medications       atorvastatin 80 MG tablet  Commonly known as:  LIPITOR  Take 1 tablet (80 mg total) by mouth daily at 6 PM.     carvedilol 12.5 MG tablet  Commonly known as:  COREG  Take 6.25 mg by mouth 2 (two) times daily with a meal.     digoxin 0.125 MG tablet  Commonly known as:  LANOXIN  Take 1 tablet (0.125 mg total) by mouth daily.     furosemide 40 MG tablet  Commonly known as:  LASIX  Take 1 tablet (40 mg total) by mouth daily.     hydrALAZINE 25 MG tablet  Commonly known as:  APRESOLINE  Take 1 tablet (25 mg total) by mouth 3 (three) times daily.     isosorbide mononitrate 30 MG 24 hr tablet  Commonly known as:  IMDUR  Take 1 tablet (30 mg total) by mouth daily.     losartan 25 MG tablet  Commonly known as:  COZAAR  Take 1 tablet (25 mg total) by mouth daily.     polyethylene glycol powder powder  Commonly known as:  GLYCOLAX/MIRALAX  Take 17 g by mouth daily.     warfarin 6 MG tablet  Commonly known as:  COUMADIN  Take as directed by coumadin clinic         PHYSICAL EXAM: Filed Vitals:   12/17/13 1318  BP: 130/86  Pulse: 55  Weight: 129 lb (58.514 kg)  SpO2: 100%    General:  Well appearing. No resp difficulty  HEENT: normal Neck: supple. JVP flat. Carotids 2+ bilaterally; no bruits. No lymphadenopathy or thryomegaly appreciated. Cor: PMI normal. Regular rate & rhythm. No rubs, gallops or murmurs. Event monitor on.  Lungs: CTA Abdomen: soft, nontender, nondistended. No hepatosplenomegaly. No bruits or masses. Good bowel sounds. Extremities: no cyanosis, clubbing, rash,  edema Neuro: alert & orientedx3, cranial nerves grossly intact. Moves all 4 extremities w/o difficulty. Affect pleasant.   ASSESSMENT & PLAN:  1) Chronic systolic HF: Ischemic cardiomyopathy, EF 40-45% in 6/14.  She has an inferior wall aneurysm.  NYHA II symptoms. Volume status status. Continue lasix 40 mg diaily aly.  - Continue carvedilol 9.375 mg twice a  Day will not titrate with HR 55.  Continue digoxin 0.0625 mg daily. Continue losartan 25 mg. She is not ace due to cough.   Increase hydralazine to 50 mg tid and continue imdur 30 mg daily.  Reinforced daily weights, low salt food choices, and limiting fluid intake to < 2 liters per day.   2) CAD: No ischemic symptoms. Continue warfarin and has stable CAD. Restart atorvastain. Will need LFTs and repeat lipids at next visit.  3) LV aneurysm:  Felt to be high-risk for embolic phenomenon -> continue coumadin.   Follow up in 3 months with an ECHO with LFTs, dig level, and lipids.   CLEGG,AMY NP-C 12/17/2013

## 2014-01-07 ENCOUNTER — Ambulatory Visit (INDEPENDENT_AMBULATORY_CARE_PROVIDER_SITE_OTHER): Payer: Medicare Other | Admitting: *Deleted

## 2014-01-07 DIAGNOSIS — Z7901 Long term (current) use of anticoagulants: Secondary | ICD-10-CM

## 2014-01-07 DIAGNOSIS — I253 Aneurysm of heart: Secondary | ICD-10-CM

## 2014-01-07 LAB — POCT INR: INR: 2.3

## 2014-01-07 MED ORDER — WARFARIN SODIUM 6 MG PO TABS: ORAL_TABLET | ORAL | Status: DC

## 2014-01-14 ENCOUNTER — Other Ambulatory Visit (HOSPITAL_COMMUNITY): Payer: Self-pay | Admitting: Cardiology

## 2014-01-14 DIAGNOSIS — I509 Heart failure, unspecified: Secondary | ICD-10-CM

## 2014-01-14 MED ORDER — FUROSEMIDE 40 MG PO TABS: 40.0000 mg | ORAL_TABLET | Freq: Every day | ORAL | Status: DC

## 2014-01-14 MED ORDER — CARVEDILOL 6.25 MG PO TABS: 9.3750 mg | ORAL_TABLET | Freq: Two times a day (BID) | ORAL | Status: DC

## 2014-01-14 MED ORDER — ISOSORBIDE MONONITRATE ER 30 MG PO TB24: 30.0000 mg | ORAL_TABLET | Freq: Every day | ORAL | Status: DC

## 2014-02-18 ENCOUNTER — Other Ambulatory Visit (HOSPITAL_COMMUNITY): Payer: Self-pay | Admitting: Cardiology

## 2014-02-18 ENCOUNTER — Ambulatory Visit (INDEPENDENT_AMBULATORY_CARE_PROVIDER_SITE_OTHER): Payer: Medicare Other | Admitting: Surgery

## 2014-02-18 DIAGNOSIS — I253 Aneurysm of heart: Secondary | ICD-10-CM

## 2014-02-18 DIAGNOSIS — I5021 Acute systolic (congestive) heart failure: Secondary | ICD-10-CM

## 2014-02-18 DIAGNOSIS — I509 Heart failure, unspecified: Secondary | ICD-10-CM

## 2014-02-18 DIAGNOSIS — Z7901 Long term (current) use of anticoagulants: Secondary | ICD-10-CM

## 2014-02-18 LAB — POCT INR: INR: 2.1

## 2014-02-18 MED ORDER — ISOSORBIDE MONONITRATE ER 30 MG PO TB24: 30.0000 mg | ORAL_TABLET | Freq: Every day | ORAL | Status: DC

## 2014-02-18 MED ORDER — FUROSEMIDE 40 MG PO TABS: 40.0000 mg | ORAL_TABLET | Freq: Every day | ORAL | Status: DC

## 2014-02-18 MED ORDER — LOSARTAN POTASSIUM 25 MG PO TABS: 25.0000 mg | ORAL_TABLET | Freq: Every day | ORAL | Status: DC

## 2014-02-18 MED ORDER — CARVEDILOL 6.25 MG PO TABS: 9.3750 mg | ORAL_TABLET | Freq: Two times a day (BID) | ORAL | Status: DC

## 2014-02-18 MED ORDER — DIGOXIN 125 MCG PO TABS: 0.0625 mg | ORAL_TABLET | Freq: Every day | ORAL | Status: DC

## 2014-03-19 ENCOUNTER — Other Ambulatory Visit (HOSPITAL_COMMUNITY): Payer: Self-pay

## 2014-03-19 ENCOUNTER — Telehealth (HOSPITAL_COMMUNITY): Payer: Self-pay | Admitting: Vascular Surgery

## 2014-03-19 ENCOUNTER — Other Ambulatory Visit: Payer: Self-pay | Admitting: *Deleted

## 2014-03-19 DIAGNOSIS — I5021 Acute systolic (congestive) heart failure: Secondary | ICD-10-CM

## 2014-03-19 DIAGNOSIS — Z7901 Long term (current) use of anticoagulants: Secondary | ICD-10-CM

## 2014-03-19 DIAGNOSIS — I509 Heart failure, unspecified: Secondary | ICD-10-CM

## 2014-03-19 MED ORDER — HYDRALAZINE HCL 50 MG PO TABS: 50.0000 mg | ORAL_TABLET | Freq: Three times a day (TID) | ORAL | Status: DC

## 2014-03-19 MED ORDER — FUROSEMIDE 40 MG PO TABS: 40.0000 mg | ORAL_TABLET | Freq: Every day | ORAL | Status: DC

## 2014-03-19 MED ORDER — CARVEDILOL 6.25 MG PO TABS: 9.3750 mg | ORAL_TABLET | Freq: Two times a day (BID) | ORAL | Status: DC

## 2014-03-19 MED ORDER — ISOSORBIDE MONONITRATE ER 30 MG PO TB24: 30.0000 mg | ORAL_TABLET | Freq: Every day | ORAL | Status: DC

## 2014-03-19 MED ORDER — WARFARIN SODIUM 6 MG PO TABS: ORAL_TABLET | ORAL | Status: DC

## 2014-03-19 MED ORDER — DIGOXIN 125 MCG PO TABS: 0.0625 mg | ORAL_TABLET | Freq: Every day | ORAL | Status: DC

## 2014-03-19 NOTE — Telephone Encounter (Signed)
Refill Carvedilol , furosemide, Isosorbide , digoxin and Hydralazine

## 2014-04-01 ENCOUNTER — Ambulatory Visit (INDEPENDENT_AMBULATORY_CARE_PROVIDER_SITE_OTHER): Payer: Medicare Other | Admitting: *Deleted

## 2014-04-01 DIAGNOSIS — Z7901 Long term (current) use of anticoagulants: Secondary | ICD-10-CM

## 2014-04-01 DIAGNOSIS — I253 Aneurysm of heart: Secondary | ICD-10-CM

## 2014-04-01 LAB — POCT INR: INR: 1.7

## 2014-04-07 IMAGING — CR DG ABDOMEN 1V
1 series · 1 of 1 positions shown · non-contrast
Comparison: None.

CLINICAL DATA: Abdominal pain

EXAM:
ABDOMEN - 1 VIEW

[view not recorded]
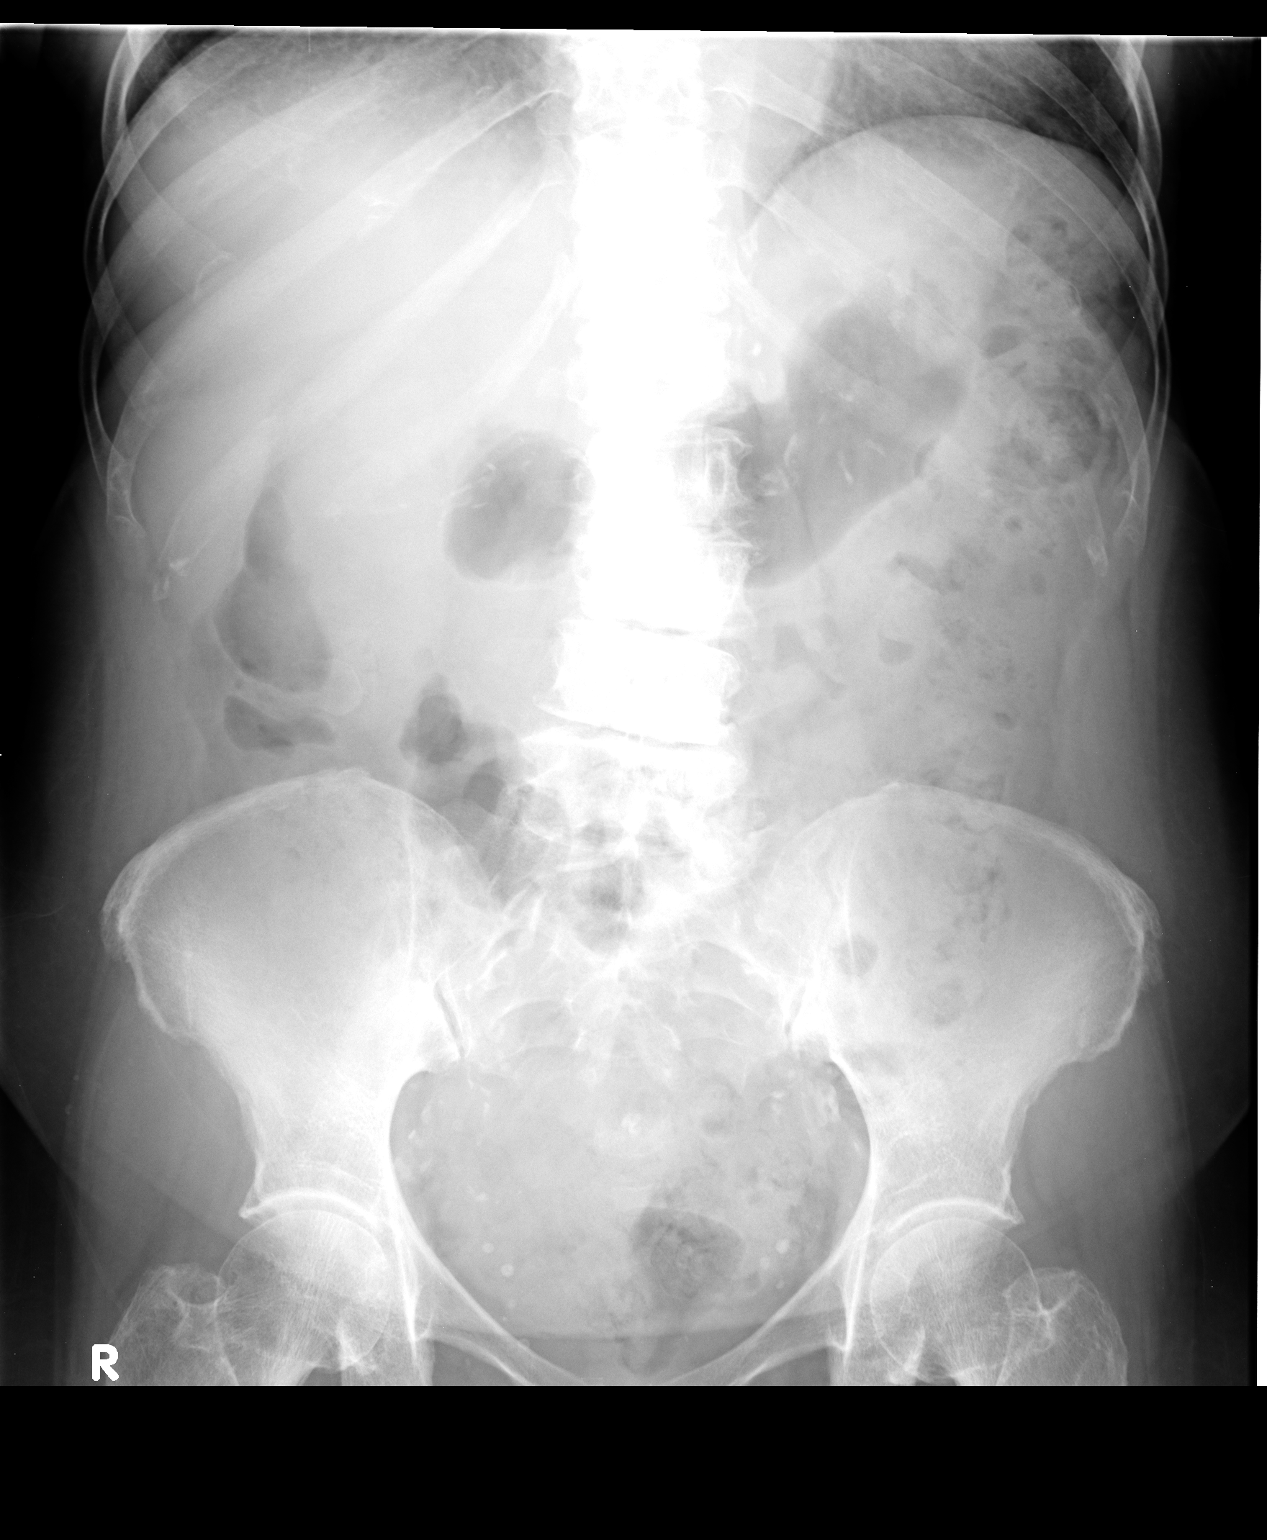

[1 of 1 positions shown; findings below may reference images not displayed]

FINDINGS: Bowel gas pattern is unremarkable. No obstruction or free air is
seen on this supine examination. There are multiple foci of arterial
vascular calcification. There is extensive arthropathy in the lumbar
spine. There are phleboliths in pelvis.

There is calcification in the mid right abdomen of uncertain
etiology.
IMPRESSION: Extensive atherosclerotic change. Bowel gas pattern unremarkable.
Calcification the mid right abdomen of uncertain etiology. A
laminated gallstone is a differential consideration.

## 2014-04-17 ENCOUNTER — Encounter (HOSPITAL_COMMUNITY): Payer: Self-pay | Admitting: Vascular Surgery

## 2014-04-24 ENCOUNTER — Telehealth (HOSPITAL_COMMUNITY): Payer: Self-pay | Admitting: Vascular Surgery

## 2014-04-24 DIAGNOSIS — I5021 Acute systolic (congestive) heart failure: Secondary | ICD-10-CM

## 2014-04-24 DIAGNOSIS — I509 Heart failure, unspecified: Secondary | ICD-10-CM

## 2014-04-24 MED ORDER — DIGOXIN 125 MCG PO TABS: 0.0625 mg | ORAL_TABLET | Freq: Every day | ORAL | Status: DC

## 2014-04-24 MED ORDER — FUROSEMIDE 40 MG PO TABS: 40.0000 mg | ORAL_TABLET | Freq: Every day | ORAL | Status: DC

## 2014-04-24 MED ORDER — ATORVASTATIN CALCIUM 80 MG PO TABS: 80.0000 mg | ORAL_TABLET | Freq: Every day | ORAL | Status: DC

## 2014-04-24 MED ORDER — ISOSORBIDE MONONITRATE ER 30 MG PO TB24: 30.0000 mg | ORAL_TABLET | Freq: Every day | ORAL | Status: DC

## 2014-04-24 MED ORDER — LOSARTAN POTASSIUM 25 MG PO TABS: 25.0000 mg | ORAL_TABLET | Freq: Every day | ORAL | Status: DC

## 2014-04-24 NOTE — Telephone Encounter (Signed)
Pt need refill on Losartan, Isosorbide , lasix, Digoxin and Lipitor.. Please advise

## 2014-04-27 ENCOUNTER — Other Ambulatory Visit (HOSPITAL_COMMUNITY): Payer: Self-pay | Admitting: Cardiology

## 2014-04-27 ENCOUNTER — Other Ambulatory Visit: Payer: Self-pay | Admitting: *Deleted

## 2014-04-27 ENCOUNTER — Telehealth (HOSPITAL_COMMUNITY): Payer: Self-pay | Admitting: Vascular Surgery

## 2014-04-27 DIAGNOSIS — Z7901 Long term (current) use of anticoagulants: Secondary | ICD-10-CM

## 2014-04-27 DIAGNOSIS — I5021 Acute systolic (congestive) heart failure: Secondary | ICD-10-CM

## 2014-04-27 DIAGNOSIS — I509 Heart failure, unspecified: Secondary | ICD-10-CM

## 2014-04-27 MED ORDER — WARFARIN SODIUM 6 MG PO TABS: ORAL_TABLET | ORAL | Status: DC

## 2014-04-27 MED ORDER — ATORVASTATIN CALCIUM 80 MG PO TABS: 80.0000 mg | ORAL_TABLET | Freq: Every day | ORAL | Status: DC

## 2014-04-27 MED ORDER — ISOSORBIDE MONONITRATE ER 30 MG PO TB24: 30.0000 mg | ORAL_TABLET | Freq: Every day | ORAL | Status: DC

## 2014-04-27 MED ORDER — FUROSEMIDE 40 MG PO TABS: 40.0000 mg | ORAL_TABLET | Freq: Every day | ORAL | Status: DC

## 2014-04-27 MED ORDER — LOSARTAN POTASSIUM 25 MG PO TABS: 25.0000 mg | ORAL_TABLET | Freq: Every day | ORAL | Status: DC

## 2014-04-27 MED ORDER — DIGOXIN 125 MCG PO TABS: 0.0625 mg | ORAL_TABLET | Freq: Every day | ORAL | Status: DC

## 2014-04-27 NOTE — Telephone Encounter (Signed)
Refill Digoxin, Isosorbide lasix, Atorvastatin, Carvedilol and potassium.[name]

## 2014-04-30 ENCOUNTER — Ambulatory Visit (INDEPENDENT_AMBULATORY_CARE_PROVIDER_SITE_OTHER): Payer: Medicare Other | Admitting: *Deleted

## 2014-04-30 DIAGNOSIS — Z7901 Long term (current) use of anticoagulants: Secondary | ICD-10-CM

## 2014-04-30 DIAGNOSIS — I253 Aneurysm of heart: Secondary | ICD-10-CM

## 2014-04-30 LAB — POCT INR: INR: 2.5

## 2014-05-06 NOTE — Telephone Encounter (Signed)
Pt called to report she lost her rx bottle of carvedilol after having it refilled a few days ago. Per Benjamine Mola with the [facility], pt has not has med filled with this pharmacy since July 2015 Will get refill ready for pt  Detailed message left for pt notifying patient med is ready at the [facility], if she is using a different pharmacy she should call back and let us know.

## 2014-05-20 ENCOUNTER — Telehealth (HOSPITAL_COMMUNITY): Payer: Self-pay | Admitting: Vascular Surgery

## 2014-05-20 MED ORDER — HYDRALAZINE HCL 50 MG PO TABS: 50.0000 mg | ORAL_TABLET | Freq: Three times a day (TID) | ORAL | Status: DC

## 2014-05-20 NOTE — Telephone Encounter (Signed)
Refill Hydrazaline

## 2014-05-28 ENCOUNTER — Ambulatory Visit (HOSPITAL_BASED_OUTPATIENT_CLINIC_OR_DEPARTMENT_OTHER)
Admission: RE | Admit: 2014-05-28 | Discharge: 2014-05-28 | Disposition: A | Discharge status: Home / Self Care | Payer: Medicare Other | Source: Ambulatory Visit | Attending: Internal Medicine | Admitting: Internal Medicine

## 2014-05-28 ENCOUNTER — Ambulatory Visit (HOSPITAL_COMMUNITY)
Admission: RE | Admit: 2014-05-28 | Discharge: 2014-05-28 | Disposition: A | Discharge status: Home / Self Care | Payer: Medicare Other | Source: Ambulatory Visit | Attending: Adult Health | Admitting: Adult Health

## 2014-05-28 ENCOUNTER — Ambulatory Visit (INDEPENDENT_AMBULATORY_CARE_PROVIDER_SITE_OTHER): Payer: Medicare Other | Admitting: *Deleted

## 2014-05-28 VITALS — BP 126/56 | HR 53 | Wt 120.0 lb

## 2014-05-28 DIAGNOSIS — I517 Cardiomegaly: Secondary | ICD-10-CM | POA: Insufficient documentation

## 2014-05-28 DIAGNOSIS — R0989 Other specified symptoms and signs involving the circulatory and respiratory systems: Secondary | ICD-10-CM | POA: Insufficient documentation

## 2014-05-28 DIAGNOSIS — I5022 Chronic systolic (congestive) heart failure: Secondary | ICD-10-CM

## 2014-05-28 DIAGNOSIS — I379 Nonrheumatic pulmonary valve disorder, unspecified: Secondary | ICD-10-CM | POA: Diagnosis not present

## 2014-05-28 DIAGNOSIS — R079 Chest pain, unspecified: Secondary | ICD-10-CM | POA: Insufficient documentation

## 2014-05-28 DIAGNOSIS — I079 Rheumatic tricuspid valve disease, unspecified: Secondary | ICD-10-CM | POA: Diagnosis not present

## 2014-05-28 DIAGNOSIS — R55 Syncope and collapse: Secondary | ICD-10-CM | POA: Insufficient documentation

## 2014-05-28 DIAGNOSIS — I2510 Atherosclerotic heart disease of native coronary artery without angina pectoris: Secondary | ICD-10-CM

## 2014-05-28 DIAGNOSIS — I10 Essential (primary) hypertension: Secondary | ICD-10-CM | POA: Insufficient documentation

## 2014-05-28 DIAGNOSIS — R0609 Other forms of dyspnea: Secondary | ICD-10-CM | POA: Diagnosis not present

## 2014-05-28 DIAGNOSIS — I509 Heart failure, unspecified: Secondary | ICD-10-CM | POA: Diagnosis not present

## 2014-05-28 DIAGNOSIS — I252 Old myocardial infarction: Secondary | ICD-10-CM | POA: Diagnosis not present

## 2014-05-28 DIAGNOSIS — I253 Aneurysm of heart: Secondary | ICD-10-CM

## 2014-05-28 DIAGNOSIS — I059 Rheumatic mitral valve disease, unspecified: Secondary | ICD-10-CM

## 2014-05-28 DIAGNOSIS — Z7901 Long term (current) use of anticoagulants: Secondary | ICD-10-CM

## 2014-05-28 LAB — POCT INR: INR: 2.6

## 2014-05-28 NOTE — Progress Notes (Signed)
Patient ID: Faith Gray, female   DOB: Feb 22, 1941, 73 y.o.   MRN: [id number]   Weight Range   125-127 pounds  Baseline proBNP   4000 09/07/12  PCP: None INR- [facility] HPI: Ms. 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    Admitted to [facility] 12/13 for new onset acute systolic heart failure. Echo showed EF 25-30% with diffuse hypokinesis. Mild MR.   Cath 12/13:  Left main: Normal  LAD: Gives off 3 diagonals. Mild diffuse plaquing with 30-40% lesion in midsection. Otherwise normal.  LCX: Tiny ramus. Large OM-1 & OM-2. 30% lesion in ostial LCX. Otherwise mild plaque.  RCA: Dominant vessel with diffuse disease. Diffuse 30-40% through midsection. In distal RCA there is a ruptured plaque with about 60% stenosis. PDA moderate diffuse disease. In distal RCA just prior to take-off of PLs there is a very focal area of myocardial bridging with severe systolic compression.  LV-gram done in the RAO projection: Ejection fraction = 25-30%. There is a large aneurysm at the base to mid inferior wall. Anterior wall mild HK. No significant MR noted.  Admitted to [facility] 02/24/13-02/25/13 for near syncope after taking hydralazine early and jumping up quickly. Work up negative. ECHO EF improved to 40-45%, Severe hypokinesis base inferolateral. Akinesis base/mid inferior wall. 30 day Event monitor placed 03/11/13. This showed no significant arrhythmias.   Echo 05/28/14: EF 40-45% large aneurysm at base of inferolateral wall.   She returns for follow up. Doing well. Walking 5-6 blocks at a time without problem. No CP or SOB. Weight stable. No edema. No problems with meds. No dizziness. No bleeding with coumadin. No PCP.    Labs (8/14): K 4, creatinine 0.91 Labs (08/21/13): dig level 1.2 digoxin cut back to 0.0625 mg daily K 4.6 Creatinine 0.89   ROS: All  systems negative except as listed in HPI, PMH and Problem List.  Past Medical History  Diagnosis Date  . Shortness of breath   . CHF (congestive heart failure)   . Anemia   . CAD (coronary artery disease) 09/11/2012  . NSTEMI (non-ST elevated myocardial infarction) 09/11/2012  . Essential hypertension 10/04/2012      Medication List    ASK your doctor about these medications       atorvastatin 80 MG tablet  Commonly known as:  LIPITOR  Take 1 tablet (80 mg total) by mouth daily at 6 PM.     carvedilol 6.25 MG tablet  Commonly known as:  COREG  Take 1.5 tablets (9.375 mg total) by mouth 2 (two) times daily with a meal.     digoxin 0.125 MG tablet  Commonly known as:  LANOXIN  Take 0.5 tablets (0.0625 mg total) by mouth daily.     furosemide 40 MG tablet  Commonly known as:  LASIX  Take 1 tablet (40 mg total) by mouth daily.     hydrALAZINE 50 MG tablet  Commonly known as:  APRESOLINE  Take 1 tablet (50 mg total) by mouth 3 (three) times daily.     isosorbide mononitrate 30 MG 24 hr tablet  Commonly known as:  IMDUR  Take 1 tablet (30 mg total) by mouth daily.     losartan 25 MG tablet  Commonly known as:  COZAAR  Take 1 tablet (25 mg total) by mouth daily.     polyethylene glycol powder  powder  Commonly known as:  GLYCOLAX/MIRALAX  Take 17 g by mouth daily.     warfarin 6 MG tablet  Commonly known as:  COUMADIN  1 tablet on all days except 1 and 1/2 tablets on Monday and Friday or  as directed by coumadin clinic         PHYSICAL EXAM: Filed Vitals:   05/28/14 1410  BP: 126/56  Pulse: 53  Weight: 120 lb (54.432 kg)  SpO2: 97%    General:  Well appearing. No resp difficulty  HEENT: normal Neck: supple. JVP flat. Carotids 2+ bilaterally; no bruits. No lymphadenopathy or thryomegaly appreciated. Cor: PMI normal. Regular rate & rhythm. No rubs, gallops. Soft SEM at RSB. s2 preserved. Lungs: CTA Abdomen: soft, nontender, nondistended. No  hepatosplenomegaly. No bruits or masses. Good bowel sounds. Extremities: no cyanosis, clubbing, rash, edema.  R foot s/p skin grafting Neuro: alert & orientedx3, cranial nerves grossly intact. Moves all 4 extremities w/o difficulty. Affect pleasant.   ASSESSMENT & PLAN:  1) Chronic systolic HF: Ischemic cardiomyopathy. Echo reviewed with her today. Stable EF 40-45%  She has an inferior wall aneurysm.   - Doing well. NYHA II symptoms. Volume status status. - Continue lasix 40 mg daily. - Continue carvedilol 9.375 mg bid - will not titrate with HR 55.  - Stop digoxin - Continue losartan 25 mg. She is not ace due to cough. If K is < 4.8 on labs will increase to 50 mg daily - Continue hydralazine to 50 mg tid and continue imdur 30 mg daily.  Reinforced daily weights, low salt food choices, and limiting fluid intake to < 2 liters per day.  2) CAD: No ischemic symptoms. Continue warfarin and has stable CAD. Continue atorvastain. Will need LFTs and repeat lipids at next visit.  3) LV aneurysm:  Felt to be high-risk for embolic phenomenon -> continue coumadin.   Follow up in 3 months with an ECHO with LFTs, dig level, and lipids.   [name] MD  05/28/2014

## 2014-05-28 NOTE — Patient Instructions (Addendum)
Fasting Labs are needed. (CBC, CMET, LIPIDS)  STOP Digoxin If your labs are ok we will increase your Losartan to 50 mg daily. Your physician recommends that you schedule a follow-up appointment in: 6 months   Do the following things EVERYDAY: 1) Weigh yourself in the morning before breakfast. Write it down and keep it in a log. 2) Take your medicines as prescribed 3) Eat low salt foods-Limit salt (sodium) to 2000 mg per day.  4) Stay as active as you can everyday 5) Limit all fluids for the day to less than 2 liters 6)

## 2014-05-28 NOTE — Progress Notes (Signed)
  Echocardiogram 2D Echocardiogram has been performed.  Donata Clay 05/28/2014, 2:20 PM

## 2014-06-02 ENCOUNTER — Telehealth (HOSPITAL_COMMUNITY): Payer: Self-pay | Admitting: Vascular Surgery

## 2014-06-02 ENCOUNTER — Ambulatory Visit (HOSPITAL_COMMUNITY)
Admission: RE | Admit: 2014-06-02 | Discharge: 2014-06-02 | Disposition: A | Discharge status: Home / Self Care | Payer: Medicare Other | Source: Ambulatory Visit | Attending: Cardiology | Admitting: Cardiology

## 2014-06-02 DIAGNOSIS — I509 Heart failure, unspecified: Secondary | ICD-10-CM

## 2014-06-02 DIAGNOSIS — I5022 Chronic systolic (congestive) heart failure: Secondary | ICD-10-CM | POA: Diagnosis present

## 2014-06-02 DIAGNOSIS — R141 Gas pain: Secondary | ICD-10-CM

## 2014-06-02 DIAGNOSIS — R143 Flatulence: Secondary | ICD-10-CM

## 2014-06-02 DIAGNOSIS — I253 Aneurysm of heart: Secondary | ICD-10-CM

## 2014-06-02 DIAGNOSIS — R142 Eructation: Secondary | ICD-10-CM

## 2014-06-02 DIAGNOSIS — R10811 Right upper quadrant abdominal tenderness: Secondary | ICD-10-CM

## 2014-06-02 DIAGNOSIS — I2510 Atherosclerotic heart disease of native coronary artery without angina pectoris: Secondary | ICD-10-CM

## 2014-06-02 DIAGNOSIS — Z7901 Long term (current) use of anticoagulants: Secondary | ICD-10-CM

## 2014-06-02 DIAGNOSIS — I259 Chronic ischemic heart disease, unspecified: Secondary | ICD-10-CM

## 2014-06-02 LAB — LIPID PANEL
CHOL/HDL RATIO: 2.2 ratio
Cholesterol: 121 mg/dL (ref 0–200)
HDL: 55 mg/dL (ref >39)
LDL CALC: 55 mg/dL (ref 0–99)
TRIGLYCERIDES: 53 mg/dL (ref <150)
VLDL: 11 mg/dL (ref 0–40)

## 2014-06-02 LAB — HEPATIC FUNCTION PANEL
ALT: 15 U/L (ref 0–35)
AST: 17 U/L (ref 0–37)
Albumin: 3.3 g/dL — ABNORMAL LOW (ref 3.5–5.2)
Alkaline Phosphatase: 119 U/L — ABNORMAL HIGH (ref 39–117)
TOTAL PROTEIN: 7.2 g/dL (ref 6.0–8.3)
Total Bilirubin: 0.4 mg/dL (ref 0.3–1.2)

## 2014-06-02 MED ORDER — ISOSORBIDE MONONITRATE ER 30 MG PO TB24: 30.0000 mg | ORAL_TABLET | Freq: Every day | ORAL | Status: DC

## 2014-06-02 MED ORDER — ATORVASTATIN CALCIUM 80 MG PO TABS: 80.0000 mg | ORAL_TABLET | Freq: Every day | ORAL | Status: DC

## 2014-06-02 NOTE — Addendum Note (Signed)
Addended by: Kerry Dory on: 06/02/2014 04:16 PM   Modules accepted: Orders

## 2014-06-02 NOTE — Telephone Encounter (Signed)
Refill Lipitor and Isosorbide

## 2014-06-02 NOTE — Telephone Encounter (Signed)
As requested refills sent into pharmacy 

## 2014-06-05 ENCOUNTER — Encounter (HOSPITAL_COMMUNITY): Payer: Self-pay | Admitting: *Deleted

## 2014-06-09 ENCOUNTER — Telehealth (HOSPITAL_COMMUNITY): Payer: Self-pay | Admitting: Vascular Surgery

## 2014-06-09 DIAGNOSIS — I5021 Acute systolic (congestive) heart failure: Secondary | ICD-10-CM

## 2014-06-09 MED ORDER — LOSARTAN POTASSIUM 25 MG PO TABS: 25.0000 mg | ORAL_TABLET | Freq: Every day | ORAL | Status: DC

## 2014-06-09 NOTE — Telephone Encounter (Signed)
Refill Losartan

## 2014-06-11 ENCOUNTER — Telehealth (HOSPITAL_COMMUNITY): Payer: Self-pay | Admitting: Vascular Surgery

## 2014-06-11 NOTE — Telephone Encounter (Deleted)
Pt called again today she needs anew pre

## 2014-06-15 ENCOUNTER — Other Ambulatory Visit (HOSPITAL_COMMUNITY): Payer: Self-pay | Admitting: Cardiology

## 2014-06-15 DIAGNOSIS — I509 Heart failure, unspecified: Secondary | ICD-10-CM

## 2014-06-15 MED ORDER — CARVEDILOL 6.25 MG PO TABS: 9.3750 mg | ORAL_TABLET | Freq: Two times a day (BID) | ORAL | Status: DC

## 2014-06-15 NOTE — Telephone Encounter (Signed)
Voicemail left requesting med refill for coreg As requested refills sent into pharmacy

## 2014-06-23 NOTE — Telephone Encounter (Signed)
Addressed via fax to the [facility] out patient pharmacy

## 2014-07-02 ENCOUNTER — Ambulatory Visit (INDEPENDENT_AMBULATORY_CARE_PROVIDER_SITE_OTHER): Payer: Medicare Other | Admitting: *Deleted

## 2014-07-02 DIAGNOSIS — Z7901 Long term (current) use of anticoagulants: Secondary | ICD-10-CM

## 2014-07-02 DIAGNOSIS — I253 Aneurysm of heart: Secondary | ICD-10-CM

## 2014-07-02 LAB — POCT INR: INR: 2.4

## 2014-07-06 NOTE — Telephone Encounter (Signed)
Encounter open in error 

## 2014-08-12 ENCOUNTER — Ambulatory Visit (INDEPENDENT_AMBULATORY_CARE_PROVIDER_SITE_OTHER): Payer: Medicare Other | Admitting: Pharmacist

## 2014-08-12 DIAGNOSIS — I253 Aneurysm of heart: Secondary | ICD-10-CM

## 2014-08-12 DIAGNOSIS — Z7901 Long term (current) use of anticoagulants: Secondary | ICD-10-CM

## 2014-08-12 LAB — POCT INR: INR: 2.6

## 2014-08-27 ENCOUNTER — Encounter (HOSPITAL_COMMUNITY): Payer: Self-pay | Admitting: Internal Medicine

## 2014-09-30 ENCOUNTER — Ambulatory Visit (INDEPENDENT_AMBULATORY_CARE_PROVIDER_SITE_OTHER): Payer: Medicare Other | Admitting: *Deleted

## 2014-09-30 DIAGNOSIS — I253 Aneurysm of heart: Secondary | ICD-10-CM

## 2014-09-30 DIAGNOSIS — Z7901 Long term (current) use of anticoagulants: Secondary | ICD-10-CM

## 2014-09-30 LAB — POCT INR: INR: 3.7

## 2014-10-30 ENCOUNTER — Ambulatory Visit (INDEPENDENT_AMBULATORY_CARE_PROVIDER_SITE_OTHER): Payer: Medicare Other | Admitting: *Deleted

## 2014-10-30 DIAGNOSIS — I253 Aneurysm of heart: Secondary | ICD-10-CM

## 2014-10-30 DIAGNOSIS — Z7901 Long term (current) use of anticoagulants: Secondary | ICD-10-CM | POA: Diagnosis not present

## 2014-10-30 LAB — POCT INR: INR: 2.3

## 2014-11-27 ENCOUNTER — Ambulatory Visit (INDEPENDENT_AMBULATORY_CARE_PROVIDER_SITE_OTHER): Payer: Medicare Other

## 2014-11-27 DIAGNOSIS — Z7901 Long term (current) use of anticoagulants: Secondary | ICD-10-CM | POA: Diagnosis not present

## 2014-11-27 DIAGNOSIS — I253 Aneurysm of heart: Secondary | ICD-10-CM | POA: Diagnosis not present

## 2014-11-27 LAB — POCT INR: INR: 1.7

## 2014-12-18 ENCOUNTER — Ambulatory Visit (INDEPENDENT_AMBULATORY_CARE_PROVIDER_SITE_OTHER): Payer: Medicare Other | Admitting: Pharmacist

## 2014-12-18 DIAGNOSIS — Z7901 Long term (current) use of anticoagulants: Secondary | ICD-10-CM | POA: Diagnosis not present

## 2014-12-18 DIAGNOSIS — I253 Aneurysm of heart: Secondary | ICD-10-CM

## 2014-12-18 LAB — POCT INR: INR: 2.4

## 2015-01-25 ENCOUNTER — Ambulatory Visit (INDEPENDENT_AMBULATORY_CARE_PROVIDER_SITE_OTHER): Payer: Medicare Other | Admitting: *Deleted

## 2015-01-25 DIAGNOSIS — Z7901 Long term (current) use of anticoagulants: Secondary | ICD-10-CM

## 2015-01-25 DIAGNOSIS — I253 Aneurysm of heart: Secondary | ICD-10-CM | POA: Diagnosis not present

## 2015-01-25 LAB — POCT INR: INR: 2.4

## 2015-01-27 ENCOUNTER — Encounter (HOSPITAL_COMMUNITY): Payer: Self-pay

## 2015-01-27 ENCOUNTER — Ambulatory Visit (HOSPITAL_COMMUNITY)
Admission: RE | Admit: 2015-01-27 | Discharge: 2015-01-27 | Disposition: A | Discharge status: Home / Self Care | Payer: Medicare Other | Source: Ambulatory Visit | Attending: Internal Medicine | Admitting: Internal Medicine

## 2015-01-27 VITALS — BP 110/60 | HR 96 | Wt 120.2 lb

## 2015-01-27 DIAGNOSIS — I255 Ischemic cardiomyopathy: Secondary | ICD-10-CM | POA: Diagnosis not present

## 2015-01-27 DIAGNOSIS — I252 Old myocardial infarction: Secondary | ICD-10-CM | POA: Insufficient documentation

## 2015-01-27 DIAGNOSIS — Z87891 Personal history of nicotine dependence: Secondary | ICD-10-CM | POA: Diagnosis not present

## 2015-01-27 DIAGNOSIS — I25119 Atherosclerotic heart disease of native coronary artery with unspecified angina pectoris: Secondary | ICD-10-CM | POA: Diagnosis not present

## 2015-01-27 DIAGNOSIS — Z79899 Other long term (current) drug therapy: Secondary | ICD-10-CM | POA: Insufficient documentation

## 2015-01-27 DIAGNOSIS — Z7901 Long term (current) use of anticoagulants: Secondary | ICD-10-CM | POA: Diagnosis not present

## 2015-01-27 DIAGNOSIS — I10 Essential (primary) hypertension: Secondary | ICD-10-CM | POA: Diagnosis not present

## 2015-01-27 DIAGNOSIS — I5022 Chronic systolic (congestive) heart failure: Secondary | ICD-10-CM

## 2015-01-27 DIAGNOSIS — I2510 Atherosclerotic heart disease of native coronary artery without angina pectoris: Secondary | ICD-10-CM | POA: Insufficient documentation

## 2015-01-27 DIAGNOSIS — I509 Heart failure, unspecified: Secondary | ICD-10-CM | POA: Diagnosis not present

## 2015-01-27 MED ORDER — CARVEDILOL 12.5 MG PO TABS: 12.5000 mg | ORAL_TABLET | Freq: Two times a day (BID) | ORAL | Status: DC

## 2015-01-27 MED ORDER — ISOSORBIDE MONONITRATE ER 30 MG PO TB24: 30.0000 mg | ORAL_TABLET | Freq: Every day | ORAL | Status: DC

## 2015-01-27 NOTE — Progress Notes (Signed)
Patient ID: Faith Gray, female   DOB: 04-14-1941, 74 y.o.   MRN: [id number]  Weight Range   125-127 pounds  Baseline proBNP   4000 09/07/12  PCP: None INR- [facility] HPI: [name] is a delightful 74 y.o. 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    Admitted to [facility] 12/13 for new onset acute systolic heart failure. Echo showed EF 25-30% with diffuse hypokinesis. Mild MR.   Cath 12/13:  Left main: Normal  LAD: Gives off 3 diagonals. Mild diffuse plaquing with 30-40% lesion in midsection. Otherwise normal.  LCX: Tiny ramus. Large OM-1 & OM-2. 30% lesion in ostial LCX. Otherwise mild plaque.  RCA: Dominant vessel with diffuse disease. Diffuse 30-40% through midsection. In distal RCA there is a ruptured plaque with about 60% stenosis. PDA moderate diffuse disease. In distal RCA just prior to take-off of PLs there is a very focal area of myocardial bridging with severe systolic compression.  LV-gram done in the RAO projection: Ejection fraction = 25-30%. There is a large aneurysm at the base to mid inferior wall. Anterior wall mild HK. No significant MR noted.  Admitted to [facility] 02/24/13-02/25/13 for near syncope after taking hydralazine early and jumping up quickly. Work up negative. ECHO EF improved to 40-45%, Severe hypokinesis base inferolateral. Akinesis base/mid inferior wall. 30 day Event monitor placed 03/11/13. This showed no significant arrhythmias.   Echo 05/28/14: EF 40-45% large aneurysm at base of inferolateral wall.   She returns for follow up.  Denies SOB/PND/Orthopnea. Weight at home 120 pounds.  Able to walk 3-4 blocks per day. No dizziness. No bleeding with coumadin. No PCP. Wants PCP.    Labs (8/14): K 4, creatinine 0.91 Labs (08/21/13): dig level 1.2 digoxin cut back to 0.0625 mg daily K 4.6 Creatinine 0.89   ROS: All systems negative  except as listed in HPI, PMH and Problem List.  Past Medical History  Diagnosis Date  . Shortness of breath   . CHF (congestive heart failure)   . Anemia   . CAD (coronary artery disease) 09/11/2012  . NSTEMI (non-ST elevated myocardial infarction) 09/11/2012  . Essential hypertension 10/04/2012      Medication List    ASK your doctor about these medications        atorvastatin 80 MG tablet  Commonly known as:  LIPITOR  Take 1 tablet (80 mg total) by mouth daily at 6 PM.     carvedilol 6.25 MG tablet  Commonly known as:  COREG  Take 1.5 tablets (9.375 mg total) by mouth 2 (two) times daily with a meal.     furosemide 40 MG tablet  Commonly known as:  LASIX  Take 1 tablet (40 mg total) by mouth daily.     hydrALAZINE 50 MG tablet  Commonly known as:  APRESOLINE  Take 1 tablet (50 mg total) by mouth 3 (three) times daily.     losartan 25 MG tablet  Commonly known as:  COZAAR  Take 1 tablet (25 mg total) by mouth daily.     polyethylene glycol powder powder  Commonly known as:  GLYCOLAX/MIRALAX  Take 17 g by mouth daily.     warfarin 6 MG tablet  Commonly known as:  COUMADIN  1 tablet on all days except 1 and 1/2 tablets on Monday and Friday or  as directed by coumadin clinic  PHYSICAL EXAM: Filed Vitals:   01/27/15 1456  BP: 110/60  Pulse: 96  Weight: 120 lb 4 oz (54.545 kg)  SpO2: 98%    General:  Well appearing. No resp difficulty . Ambulated in the clinic without difficulty.  HEENT: normal Neck: supple. JVP flat. Carotids 2+ bilaterally; no bruits. No lymphadenopathy or thryomegaly appreciated. Cor: PMI normal. Regular rate & rhythm. No rubs, gallops. Soft SEM at RSB. s2 preserved. Lungs: CTA Abdomen: soft, nontender, nondistended. No hepatosplenomegaly. No bruits or masses. Good bowel sounds. Extremities: no cyanosis, clubbing, rash, edema.   Neuro: alert & orientedx3, cranial nerves grossly intact. Moves all 4 extremities w/o difficulty.  Affect pleasant.   ASSESSMENT & PLAN:  1) Chronic systolic HF: Ischemic cardiomyopathy. Echo reviewed with her today. Stable EF 35-40%  She has an inferior wall aneurysm.   - Doing well. NYHA II symptoms. Volume status status. - Continue lasix 40 mg daily. - Increase carvedilol 12.5 mg   - Continue losartan 50 mg. She is not ace due to cough.  - Continue hydralazine to 50 mg tid and continue imdur 30 mg daily.  Reinforced daily weights, low salt food choices, and limiting fluid intake to < 2 liters per day.  2) CAD: No ischemic symptoms. Continue warfarin and has stable CAD. Continue atorvastain.  3) LV aneurysm:  Felt to be high-risk for embolic phenomenon -> continue coumadin.   Follow up in 6 month with and ECHO.  Refer to SW for PCP  CLEGG,AMY NP-C  01/27/2015

## 2015-01-27 NOTE — Patient Instructions (Signed)
INCREASE Carvedilol to 12.5 mg twice a day.  START IMDUR 30 mg daily.  FOLLOW UP in 4 months.  Your provider requests you have an ECHO in 6 months.

## 2015-02-01 ENCOUNTER — Telehealth: Payer: Self-pay | Admitting: Licensed Clinical Social Worker

## 2015-02-01 NOTE — Telephone Encounter (Signed)
CSW received referral to assist patient with PCP. CSW attempted to contact patient and left message for return call. CSW will await return call and offer PCP provider options. Raquel Sarna, [place]

## 2015-02-22 ENCOUNTER — Ambulatory Visit (INDEPENDENT_AMBULATORY_CARE_PROVIDER_SITE_OTHER): Payer: Medicare Other | Admitting: *Deleted

## 2015-02-22 DIAGNOSIS — Z7901 Long term (current) use of anticoagulants: Secondary | ICD-10-CM

## 2015-02-22 DIAGNOSIS — I253 Aneurysm of heart: Secondary | ICD-10-CM | POA: Diagnosis not present

## 2015-02-22 LAB — POCT INR: INR: 3.5

## 2015-02-23 ENCOUNTER — Telehealth: Payer: Self-pay | Admitting: Licensed Clinical Social Worker

## 2015-02-23 ENCOUNTER — Telehealth (HOSPITAL_COMMUNITY): Payer: Self-pay | Admitting: Vascular Surgery

## 2015-02-23 NOTE — Telephone Encounter (Signed)
Referral sent to [facility]

## 2015-02-23 NOTE — Telephone Encounter (Signed)
CSW rec'd referral to assist with obtaining a PCP. CSW attempted again to reach patient and left message for return call. Raquel Sarna, [place]

## 2015-02-23 NOTE — Telephone Encounter (Signed)
PT called she was told that Faith Gray would help her find a pcp.. She is waiting from a call from the Social worker for help with that.. Please advise

## 2015-03-05 ENCOUNTER — Other Ambulatory Visit (HOSPITAL_COMMUNITY): Payer: Self-pay | Admitting: *Deleted

## 2015-03-05 DIAGNOSIS — I5021 Acute systolic (congestive) heart failure: Secondary | ICD-10-CM

## 2015-03-05 MED ORDER — LOSARTAN POTASSIUM 25 MG PO TABS: 25.0000 mg | ORAL_TABLET | Freq: Every day | ORAL | Status: DC

## 2015-03-10 ENCOUNTER — Ambulatory Visit (INDEPENDENT_AMBULATORY_CARE_PROVIDER_SITE_OTHER): Payer: Medicare Other | Admitting: *Deleted

## 2015-03-10 DIAGNOSIS — I253 Aneurysm of heart: Secondary | ICD-10-CM

## 2015-03-10 DIAGNOSIS — Z7901 Long term (current) use of anticoagulants: Secondary | ICD-10-CM

## 2015-03-10 LAB — POCT INR: INR: 1.1

## 2015-03-10 MED ORDER — WARFARIN SODIUM 6 MG PO TABS: ORAL_TABLET | ORAL | Status: DC

## 2015-03-17 ENCOUNTER — Ambulatory Visit (INDEPENDENT_AMBULATORY_CARE_PROVIDER_SITE_OTHER): Payer: Medicare Other | Admitting: *Deleted

## 2015-03-17 DIAGNOSIS — Z7901 Long term (current) use of anticoagulants: Secondary | ICD-10-CM | POA: Diagnosis not present

## 2015-03-17 DIAGNOSIS — I253 Aneurysm of heart: Secondary | ICD-10-CM | POA: Diagnosis not present

## 2015-03-17 LAB — POCT INR: INR: 2.6

## 2015-03-31 ENCOUNTER — Ambulatory Visit (INDEPENDENT_AMBULATORY_CARE_PROVIDER_SITE_OTHER): Payer: Medicare Other | Admitting: *Deleted

## 2015-03-31 DIAGNOSIS — Z7901 Long term (current) use of anticoagulants: Secondary | ICD-10-CM

## 2015-03-31 DIAGNOSIS — I253 Aneurysm of heart: Secondary | ICD-10-CM

## 2015-03-31 LAB — POCT INR: INR: 1.9

## 2015-04-22 ENCOUNTER — Ambulatory Visit (INDEPENDENT_AMBULATORY_CARE_PROVIDER_SITE_OTHER): Payer: Medicare Other | Admitting: *Deleted

## 2015-04-22 DIAGNOSIS — Z7901 Long term (current) use of anticoagulants: Secondary | ICD-10-CM | POA: Diagnosis not present

## 2015-04-22 DIAGNOSIS — I253 Aneurysm of heart: Secondary | ICD-10-CM

## 2015-04-22 LAB — POCT INR: INR: 4.3

## 2015-05-03 ENCOUNTER — Ambulatory Visit (INDEPENDENT_AMBULATORY_CARE_PROVIDER_SITE_OTHER): Payer: Medicare Other

## 2015-05-03 DIAGNOSIS — Z7901 Long term (current) use of anticoagulants: Secondary | ICD-10-CM

## 2015-05-03 DIAGNOSIS — I253 Aneurysm of heart: Secondary | ICD-10-CM

## 2015-05-03 LAB — POCT INR: INR: >8

## 2015-05-03 LAB — PROTIME-INR
INR: 8 ratio (ref 0.8–1.0)
Prothrombin Time: 83.3 s (ref 9.6–13.1)

## 2015-05-07 ENCOUNTER — Ambulatory Visit (INDEPENDENT_AMBULATORY_CARE_PROVIDER_SITE_OTHER): Payer: Medicare Other | Admitting: *Deleted

## 2015-05-07 DIAGNOSIS — I253 Aneurysm of heart: Secondary | ICD-10-CM | POA: Diagnosis not present

## 2015-05-07 DIAGNOSIS — Z7901 Long term (current) use of anticoagulants: Secondary | ICD-10-CM | POA: Diagnosis not present

## 2015-05-07 LAB — POCT INR: INR: 2.1

## 2015-05-14 ENCOUNTER — Other Ambulatory Visit (HOSPITAL_COMMUNITY): Payer: Self-pay | Admitting: Adult Health

## 2015-05-14 ENCOUNTER — Other Ambulatory Visit (HOSPITAL_COMMUNITY): Payer: Self-pay | Admitting: Internal Medicine

## 2015-05-17 ENCOUNTER — Ambulatory Visit (INDEPENDENT_AMBULATORY_CARE_PROVIDER_SITE_OTHER): Payer: Medicare Other

## 2015-05-17 DIAGNOSIS — I253 Aneurysm of heart: Secondary | ICD-10-CM

## 2015-05-17 DIAGNOSIS — Z7901 Long term (current) use of anticoagulants: Secondary | ICD-10-CM | POA: Diagnosis not present

## 2015-05-17 LAB — POCT INR: INR: 5

## 2015-05-26 ENCOUNTER — Ambulatory Visit (INDEPENDENT_AMBULATORY_CARE_PROVIDER_SITE_OTHER): Payer: Medicare Other | Admitting: *Deleted

## 2015-05-26 DIAGNOSIS — Z7901 Long term (current) use of anticoagulants: Secondary | ICD-10-CM

## 2015-05-26 DIAGNOSIS — I253 Aneurysm of heart: Secondary | ICD-10-CM

## 2015-05-26 LAB — POCT INR: INR: 1.7

## 2015-06-07 ENCOUNTER — Ambulatory Visit (INDEPENDENT_AMBULATORY_CARE_PROVIDER_SITE_OTHER): Payer: Medicare Other | Admitting: Pharmacist

## 2015-06-07 DIAGNOSIS — Z7901 Long term (current) use of anticoagulants: Secondary | ICD-10-CM

## 2015-06-07 DIAGNOSIS — I253 Aneurysm of heart: Secondary | ICD-10-CM | POA: Diagnosis not present

## 2015-06-07 LAB — POCT INR: INR: 2.4

## 2015-06-28 ENCOUNTER — Ambulatory Visit (INDEPENDENT_AMBULATORY_CARE_PROVIDER_SITE_OTHER): Payer: Medicare Other | Admitting: *Deleted

## 2015-06-28 DIAGNOSIS — I253 Aneurysm of heart: Secondary | ICD-10-CM

## 2015-06-28 DIAGNOSIS — Z7901 Long term (current) use of anticoagulants: Secondary | ICD-10-CM | POA: Diagnosis not present

## 2015-06-28 LAB — POCT INR: INR: 1.7

## 2015-07-12 ENCOUNTER — Ambulatory Visit (INDEPENDENT_AMBULATORY_CARE_PROVIDER_SITE_OTHER): Payer: Medicare Other | Admitting: Pharmacist

## 2015-07-12 DIAGNOSIS — Z7901 Long term (current) use of anticoagulants: Secondary | ICD-10-CM

## 2015-07-12 DIAGNOSIS — I253 Aneurysm of heart: Secondary | ICD-10-CM | POA: Diagnosis not present

## 2015-07-12 LAB — POCT INR: INR: 2.6

## 2015-08-02 ENCOUNTER — Other Ambulatory Visit (HOSPITAL_COMMUNITY): Payer: Self-pay | Admitting: Adult Health

## 2015-08-02 ENCOUNTER — Ambulatory Visit (INDEPENDENT_AMBULATORY_CARE_PROVIDER_SITE_OTHER): Payer: Medicare Other | Admitting: Pharmacist

## 2015-08-02 DIAGNOSIS — Z7901 Long term (current) use of anticoagulants: Secondary | ICD-10-CM | POA: Diagnosis not present

## 2015-08-02 DIAGNOSIS — I253 Aneurysm of heart: Secondary | ICD-10-CM

## 2015-08-02 LAB — POCT INR: INR: 2.4

## 2015-09-08 ENCOUNTER — Other Ambulatory Visit (HOSPITAL_COMMUNITY): Payer: Self-pay | Admitting: Adult Health

## 2015-09-08 ENCOUNTER — Other Ambulatory Visit: Payer: Self-pay | Admitting: Internal Medicine

## 2015-09-09 ENCOUNTER — Ambulatory Visit (INDEPENDENT_AMBULATORY_CARE_PROVIDER_SITE_OTHER): Payer: Medicare Other | Admitting: *Deleted

## 2015-09-09 DIAGNOSIS — Z7901 Long term (current) use of anticoagulants: Secondary | ICD-10-CM | POA: Diagnosis not present

## 2015-09-09 DIAGNOSIS — I253 Aneurysm of heart: Secondary | ICD-10-CM | POA: Diagnosis not present

## 2015-09-09 LAB — POCT INR: INR: 1.3

## 2015-09-09 MED ORDER — WARFARIN SODIUM 6 MG PO TABS: ORAL_TABLET | ORAL | Status: DC

## 2015-09-21 ENCOUNTER — Ambulatory Visit (INDEPENDENT_AMBULATORY_CARE_PROVIDER_SITE_OTHER): Payer: Medicare Other | Admitting: *Deleted

## 2015-09-21 ENCOUNTER — Other Ambulatory Visit (HOSPITAL_COMMUNITY): Payer: Self-pay | Admitting: *Deleted

## 2015-09-21 DIAGNOSIS — Z7901 Long term (current) use of anticoagulants: Secondary | ICD-10-CM

## 2015-09-21 DIAGNOSIS — I253 Aneurysm of heart: Secondary | ICD-10-CM

## 2015-09-21 LAB — POCT INR: INR: 1.4

## 2015-09-21 MED ORDER — HYDRALAZINE HCL 50 MG PO TABS: 50.0000 mg | ORAL_TABLET | Freq: Three times a day (TID) | ORAL | Status: DC

## 2015-09-21 MED FILL — hydrALAZINE HCL 50 MG TABS: 50 | 30 days supply | Qty: 90 | Fill #0

## 2015-10-01 ENCOUNTER — Ambulatory Visit (INDEPENDENT_AMBULATORY_CARE_PROVIDER_SITE_OTHER): Payer: Medicare Other | Admitting: Pharmacist

## 2015-10-01 DIAGNOSIS — I253 Aneurysm of heart: Secondary | ICD-10-CM

## 2015-10-01 DIAGNOSIS — Z7901 Long term (current) use of anticoagulants: Secondary | ICD-10-CM

## 2015-10-01 LAB — POCT INR: INR: 2

## 2015-10-01 MED FILL — CARVEDILOL 12.5 MG TABLET: 12.5 | 30 days supply | Qty: 60 | Fill #1

## 2015-10-15 ENCOUNTER — Ambulatory Visit (INDEPENDENT_AMBULATORY_CARE_PROVIDER_SITE_OTHER): Payer: Medicare Other | Admitting: *Deleted

## 2015-10-15 ENCOUNTER — Other Ambulatory Visit (HOSPITAL_COMMUNITY): Payer: Self-pay | Admitting: Internal Medicine

## 2015-10-15 ENCOUNTER — Other Ambulatory Visit: Payer: Self-pay

## 2015-10-15 DIAGNOSIS — I253 Aneurysm of heart: Secondary | ICD-10-CM

## 2015-10-15 DIAGNOSIS — Z7901 Long term (current) use of anticoagulants: Secondary | ICD-10-CM

## 2015-10-15 LAB — POCT INR: INR: 3.3

## 2015-10-15 MED ORDER — WARFARIN SODIUM 6 MG PO TABS: ORAL_TABLET | ORAL | Status: DC

## 2015-10-15 MED FILL — ATORVASTATIN 80 MG TABLET: 80 | 30 days supply | Qty: 30 | Fill #0

## 2015-10-15 MED FILL — ISOSORBIDE MN ER 30 MG TAB: 30 | 30 days supply | Qty: 30 | Fill #1

## 2015-10-15 MED FILL — WARFARIN SODIUM 6 MG TABLET: 6 | 30 days supply | Qty: 40 | Fill #1

## 2015-10-29 ENCOUNTER — Ambulatory Visit (INDEPENDENT_AMBULATORY_CARE_PROVIDER_SITE_OTHER): Payer: Medicare Other | Admitting: *Deleted

## 2015-10-29 DIAGNOSIS — I253 Aneurysm of heart: Secondary | ICD-10-CM

## 2015-10-29 DIAGNOSIS — Z7901 Long term (current) use of anticoagulants: Secondary | ICD-10-CM | POA: Diagnosis not present

## 2015-10-29 LAB — POCT INR: INR: 3.2

## 2015-11-12 ENCOUNTER — Ambulatory Visit (INDEPENDENT_AMBULATORY_CARE_PROVIDER_SITE_OTHER): Payer: Medicare Other | Admitting: *Deleted

## 2015-11-12 DIAGNOSIS — Z7901 Long term (current) use of anticoagulants: Secondary | ICD-10-CM

## 2015-11-12 DIAGNOSIS — I253 Aneurysm of heart: Secondary | ICD-10-CM

## 2015-11-12 LAB — POCT INR: INR: 3.7

## 2015-11-18 MED FILL — hydrALAZINE HCL 50 MG TABS: 50 | 30 days supply | Qty: 90 | Fill #1

## 2015-11-18 MED FILL — ISOSORBIDE MN ER 30 MG TAB: 30 | 30 days supply | Qty: 30 | Fill #2

## 2015-11-18 MED FILL — CARVEDILOL 12.5 MG TABLET: 12.5 | 30 days supply | Qty: 60 | Fill #2

## 2015-11-18 MED FILL — ATORVASTATIN 80 MG TABLET: 80 | 30 days supply | Qty: 30 | Fill #1

## 2015-11-18 MED FILL — WARFARIN SODIUM 6 MG TABLET: 6 | 30 days supply | Qty: 40 | Fill #2

## 2015-11-30 ENCOUNTER — Ambulatory Visit (INDEPENDENT_AMBULATORY_CARE_PROVIDER_SITE_OTHER): Payer: Medicare Other

## 2015-11-30 DIAGNOSIS — I253 Aneurysm of heart: Secondary | ICD-10-CM

## 2015-11-30 DIAGNOSIS — Z7901 Long term (current) use of anticoagulants: Secondary | ICD-10-CM

## 2015-11-30 LAB — POCT INR: INR: 2.2

## 2015-12-22 MED FILL — LOSARTAN POTASSIUM 25 MG TA: 25 | 30 days supply | Qty: 30 | Fill #0

## 2015-12-22 MED FILL — ATORVASTATIN 80 MG TABLET: 80 | 30 days supply | Qty: 30 | Fill #2

## 2015-12-24 ENCOUNTER — Ambulatory Visit (INDEPENDENT_AMBULATORY_CARE_PROVIDER_SITE_OTHER): Payer: Medicare Other | Admitting: *Deleted

## 2015-12-24 DIAGNOSIS — Z7901 Long term (current) use of anticoagulants: Secondary | ICD-10-CM | POA: Diagnosis not present

## 2015-12-24 DIAGNOSIS — I253 Aneurysm of heart: Secondary | ICD-10-CM | POA: Diagnosis not present

## 2015-12-24 LAB — POCT INR: INR: 2.6

## 2015-12-29 ENCOUNTER — Other Ambulatory Visit: Payer: Self-pay | Admitting: Internal Medicine

## 2015-12-29 MED FILL — FUROSEMIDE 40 MG TABLET: 40 | 30 days supply | Qty: 30 | Fill #0

## 2015-12-29 MED FILL — CARVEDILOL 12.5 MG TABLET: 12.5 | 30 days supply | Qty: 60 | Fill #3

## 2016-01-04 ENCOUNTER — Other Ambulatory Visit (HOSPITAL_COMMUNITY): Payer: Self-pay | Admitting: *Deleted

## 2016-01-04 MED ORDER — ISOSORBIDE MONONITRATE ER 30 MG PO TB24: 30.0000 mg | ORAL_TABLET | Freq: Every day | ORAL | Status: DC

## 2016-01-14 ENCOUNTER — Ambulatory Visit (INDEPENDENT_AMBULATORY_CARE_PROVIDER_SITE_OTHER): Payer: Medicare Other | Admitting: *Deleted

## 2016-01-14 DIAGNOSIS — I253 Aneurysm of heart: Secondary | ICD-10-CM

## 2016-01-14 DIAGNOSIS — Z7901 Long term (current) use of anticoagulants: Secondary | ICD-10-CM

## 2016-01-14 LAB — POCT INR: INR: 2

## 2016-01-14 MED FILL — WARFARIN SODIUM 6 MG TABLET: 6 | 30 days supply | Qty: 40 | Fill #3

## 2016-02-04 MED FILL — hydrALAZINE HCL 50 MG TABS: 50 | 30 days supply | Qty: 90 | Fill #2

## 2016-02-04 MED FILL — ATORVASTATIN 80 MG TABLET: 80 | 30 days supply | Qty: 30 | Fill #3

## 2016-02-04 MED FILL — LOSARTAN POTASSIUM 25 MG TA: 25 | 30 days supply | Qty: 30 | Fill #1

## 2016-02-04 MED FILL — ISOSORBIDE MN ER 30 MG TAB: 30 | 30 days supply | Qty: 30 | Fill #0

## 2016-02-28 ENCOUNTER — Other Ambulatory Visit: Payer: Self-pay | Admitting: Pharmacist

## 2016-02-28 ENCOUNTER — Ambulatory Visit (INDEPENDENT_AMBULATORY_CARE_PROVIDER_SITE_OTHER): Payer: Medicare Other | Admitting: *Deleted

## 2016-02-28 DIAGNOSIS — I253 Aneurysm of heart: Secondary | ICD-10-CM

## 2016-02-28 DIAGNOSIS — Z7901 Long term (current) use of anticoagulants: Secondary | ICD-10-CM

## 2016-02-28 LAB — POCT INR: INR: 1.9

## 2016-02-28 MED ORDER — WARFARIN SODIUM 6 MG PO TABS: ORAL_TABLET | ORAL | Status: DC

## 2016-02-28 MED FILL — WARFARIN SODIUM 6 MG TABLET: 6 | 30 days supply | Qty: 40 | Fill #0

## 2016-02-28 MED FILL — ISOSORBIDE MN ER 30 MG TAB: 30 | 30 days supply | Qty: 30 | Fill #1

## 2016-02-28 MED FILL — CARVEDILOL 12.5 MG TABLET: 12.5 | 30 days supply | Qty: 60 | Fill #4

## 2016-02-29 MED FILL — FUROSEMIDE 40 MG TABLET: 40 | 30 days supply | Qty: 30 | Fill #1

## 2016-04-10 ENCOUNTER — Ambulatory Visit (INDEPENDENT_AMBULATORY_CARE_PROVIDER_SITE_OTHER): Payer: Medicare Other | Admitting: *Deleted

## 2016-04-10 DIAGNOSIS — I253 Aneurysm of heart: Secondary | ICD-10-CM | POA: Diagnosis not present

## 2016-04-10 DIAGNOSIS — Z7901 Long term (current) use of anticoagulants: Secondary | ICD-10-CM | POA: Diagnosis not present

## 2016-04-10 LAB — POCT INR: INR: 1.7

## 2016-04-10 MED FILL — ATORVASTATIN 80 MG TABLET: 80 | 30 days supply | Qty: 30 | Fill #4

## 2016-04-10 MED FILL — hydrALAZINE HCL 50 MG TABS: 50 | 30 days supply | Qty: 90 | Fill #3

## 2016-04-10 MED FILL — LOSARTAN POTASSIUM 25 MG TA: 25 | 30 days supply | Qty: 30 | Fill #2

## 2016-04-10 MED FILL — WARFARIN SODIUM 6 MG TABLET: 6 | 30 days supply | Qty: 40 | Fill #1

## 2016-04-10 MED FILL — CARVEDILOL 12.5 MG TABLET: 12.5 | 30 days supply | Qty: 60 | Fill #5

## 2016-04-10 MED FILL — FUROSEMIDE 40 MG TABLET: 40 | 30 days supply | Qty: 30 | Fill #2

## 2016-04-10 MED FILL — ISOSORBIDE MN ER 30 MG TAB: 30 | 30 days supply | Qty: 30 | Fill #2

## 2016-04-27 ENCOUNTER — Ambulatory Visit (INDEPENDENT_AMBULATORY_CARE_PROVIDER_SITE_OTHER): Payer: Medicare Other | Admitting: *Deleted

## 2016-04-27 ENCOUNTER — Encounter (INDEPENDENT_AMBULATORY_CARE_PROVIDER_SITE_OTHER): Payer: Self-pay

## 2016-04-27 ENCOUNTER — Encounter: Payer: Self-pay | Admitting: Cardiology

## 2016-04-27 DIAGNOSIS — I253 Aneurysm of heart: Secondary | ICD-10-CM

## 2016-04-27 DIAGNOSIS — Z7901 Long term (current) use of anticoagulants: Secondary | ICD-10-CM | POA: Diagnosis not present

## 2016-04-27 LAB — POCT INR: INR: 3.4

## 2016-04-27 NOTE — Telephone Encounter (Signed)
This encounter was created in error - please disregard.

## 2016-05-15 ENCOUNTER — Other Ambulatory Visit (HOSPITAL_COMMUNITY): Payer: Self-pay | Admitting: *Deleted

## 2016-05-15 MED ORDER — ISOSORBIDE MONONITRATE ER 30 MG PO TB24: 30.0000 mg | ORAL_TABLET | Freq: Every day | ORAL | 2 refills | Status: DC

## 2016-05-15 MED ORDER — LOSARTAN POTASSIUM 25 MG PO TABS: 25.0000 mg | ORAL_TABLET | Freq: Every day | ORAL | 2 refills | Status: DC

## 2016-05-15 MED FILL — LOSARTAN POTASSIUM 25 MG TA: 25 | 30 days supply | Qty: 30 | Fill #0

## 2016-05-15 MED FILL — ISOSORBIDE MN ER 30 MG TAB: 30 | 30 days supply | Qty: 30 | Fill #0

## 2016-05-16 ENCOUNTER — Ambulatory Visit (INDEPENDENT_AMBULATORY_CARE_PROVIDER_SITE_OTHER): Payer: Medicare Other | Admitting: Pharmacist

## 2016-05-16 DIAGNOSIS — Z7901 Long term (current) use of anticoagulants: Secondary | ICD-10-CM | POA: Diagnosis not present

## 2016-05-16 DIAGNOSIS — I253 Aneurysm of heart: Secondary | ICD-10-CM | POA: Diagnosis not present

## 2016-05-16 LAB — POCT INR: INR: 3.7

## 2016-05-16 MED FILL — ATORVASTATIN 80 MG TABLET: 80 | 30 days supply | Qty: 30 | Fill #5

## 2016-06-01 MED FILL — CARVEDILOL 12.5 MG TABLET: 12.5 | 30 days supply | Qty: 60 | Fill #6

## 2016-06-01 MED FILL — WARFARIN SODIUM 6 MG TABLET: 6 | 30 days supply | Qty: 40 | Fill #2

## 2016-06-02 ENCOUNTER — Ambulatory Visit (INDEPENDENT_AMBULATORY_CARE_PROVIDER_SITE_OTHER): Payer: Medicare Other | Admitting: Pharmacist

## 2016-06-02 DIAGNOSIS — I253 Aneurysm of heart: Secondary | ICD-10-CM | POA: Diagnosis not present

## 2016-06-02 DIAGNOSIS — Z7901 Long term (current) use of anticoagulants: Secondary | ICD-10-CM | POA: Diagnosis not present

## 2016-06-02 LAB — POCT INR: INR: 2.2

## 2016-06-23 ENCOUNTER — Ambulatory Visit (INDEPENDENT_AMBULATORY_CARE_PROVIDER_SITE_OTHER): Payer: Medicare Other | Admitting: Pharmacist

## 2016-06-23 DIAGNOSIS — Z7901 Long term (current) use of anticoagulants: Secondary | ICD-10-CM | POA: Diagnosis not present

## 2016-06-23 DIAGNOSIS — I253 Aneurysm of heart: Secondary | ICD-10-CM | POA: Diagnosis not present

## 2016-06-23 LAB — POCT INR: INR: 2.8

## 2016-06-23 MED FILL — hydrALAZINE HCL 50 MG TABS: 50 | 30 days supply | Qty: 90 | Fill #4

## 2016-06-23 MED FILL — ISOSORBIDE MN ER 30 MG TAB: 30 | 30 days supply | Qty: 30 | Fill #1

## 2016-06-23 MED FILL — ATORVASTATIN 80 MG TABLET: 80 | 30 days supply | Qty: 30 | Fill #6

## 2016-07-14 MED FILL — CARVEDILOL 12.5 MG TABLET: 12.5 | 30 days supply | Qty: 60 | Fill #7

## 2016-07-14 MED FILL — ISOSORBIDE MN ER 30 MG TAB: 30 | 30 days supply | Qty: 30 | Fill #2

## 2016-07-14 MED FILL — LOSARTAN POTASSIUM 25 MG TA: 25 | 30 days supply | Qty: 30 | Fill #1

## 2016-07-14 MED FILL — WARFARIN SODIUM 6 MG TABLET: 6 | 30 days supply | Qty: 40 | Fill #3

## 2016-07-24 ENCOUNTER — Ambulatory Visit (INDEPENDENT_AMBULATORY_CARE_PROVIDER_SITE_OTHER): Payer: Medicare Other | Admitting: *Deleted

## 2016-07-24 DIAGNOSIS — I253 Aneurysm of heart: Secondary | ICD-10-CM

## 2016-07-24 DIAGNOSIS — Z7901 Long term (current) use of anticoagulants: Secondary | ICD-10-CM

## 2016-07-24 LAB — POCT INR: INR: 1.5

## 2016-08-07 ENCOUNTER — Ambulatory Visit (INDEPENDENT_AMBULATORY_CARE_PROVIDER_SITE_OTHER): Payer: Medicare Other | Admitting: *Deleted

## 2016-08-07 DIAGNOSIS — Z7901 Long term (current) use of anticoagulants: Secondary | ICD-10-CM | POA: Diagnosis not present

## 2016-08-07 DIAGNOSIS — I253 Aneurysm of heart: Secondary | ICD-10-CM

## 2016-08-07 LAB — POCT INR: INR: 3.2

## 2016-08-28 ENCOUNTER — Ambulatory Visit (INDEPENDENT_AMBULATORY_CARE_PROVIDER_SITE_OTHER): Payer: Medicare Other | Admitting: *Deleted

## 2016-08-28 ENCOUNTER — Other Ambulatory Visit (HOSPITAL_COMMUNITY): Payer: Self-pay | Admitting: Adult Health

## 2016-08-28 ENCOUNTER — Other Ambulatory Visit: Payer: Self-pay | Admitting: Internal Medicine

## 2016-08-28 DIAGNOSIS — I253 Aneurysm of heart: Secondary | ICD-10-CM

## 2016-08-28 DIAGNOSIS — Z7901 Long term (current) use of anticoagulants: Secondary | ICD-10-CM | POA: Diagnosis not present

## 2016-08-28 LAB — POCT INR: INR: 3.4

## 2016-08-28 MED FILL — ATORVASTATIN 80 MG TABLET: 80 | 30 days supply | Qty: 30 | Fill #7

## 2016-08-28 MED FILL — WARFARIN SODIUM 6 MG TABLET: 6 | 30 days supply | Qty: 40 | Fill #0

## 2016-08-28 MED FILL — FUROSEMIDE 40 MG TABLET: 40 | 30 days supply | Qty: 30 | Fill #3

## 2016-08-28 MED FILL — ISOSORBIDE MN ER 30 MG TAB: 30 | 30 days supply | Qty: 30 | Fill #0

## 2016-09-07 ENCOUNTER — Other Ambulatory Visit (HOSPITAL_COMMUNITY): Payer: Self-pay | Admitting: Adult Health

## 2016-09-07 MED FILL — CARVEDILOL 12.5 MG TABLET: 12.5 | 30 days supply | Qty: 60 | Fill #0

## 2016-09-07 MED FILL — hydrALAZINE HCL 50 MG TABS: 50 | 30 days supply | Qty: 90 | Fill #5

## 2016-09-15 ENCOUNTER — Ambulatory Visit (INDEPENDENT_AMBULATORY_CARE_PROVIDER_SITE_OTHER): Payer: Medicare Other | Admitting: *Deleted

## 2016-09-15 DIAGNOSIS — Z7901 Long term (current) use of anticoagulants: Secondary | ICD-10-CM | POA: Diagnosis not present

## 2016-09-15 DIAGNOSIS — I253 Aneurysm of heart: Secondary | ICD-10-CM | POA: Diagnosis not present

## 2016-09-15 LAB — POCT INR: INR: 2.4

## 2016-09-28 ENCOUNTER — Telehealth: Payer: Self-pay | Admitting: Pharmacist

## 2016-09-28 NOTE — Telephone Encounter (Signed)
Per patient will have dental procedure on 10/03/16 with [facility] - [phone] - she reports that they have a list of medications and have not told her to hold Coumadin at this time.

## 2016-09-28 NOTE — Telephone Encounter (Signed)
Smantha - Nurse with oral surgery returned call and stated that pt has only a consultation on 10/03/16 and they will fax for clearance if procedure scheduled.   Spoke to patient and made aware of the above. Advised to call after consultation and let us know if procedure scheduled. She stated understanding and appreciation.

## 2016-10-02 MED FILL — ISOSORBIDE MN ER 30 MG TAB: 30 | 30 days supply | Qty: 30 | Fill #1

## 2016-10-06 ENCOUNTER — Ambulatory Visit (INDEPENDENT_AMBULATORY_CARE_PROVIDER_SITE_OTHER): Payer: Medicare Other | Admitting: *Deleted

## 2016-10-06 DIAGNOSIS — I253 Aneurysm of heart: Secondary | ICD-10-CM | POA: Diagnosis not present

## 2016-10-06 DIAGNOSIS — Z7901 Long term (current) use of anticoagulants: Secondary | ICD-10-CM

## 2016-10-06 LAB — POCT INR: INR: 2.3

## 2016-10-17 ENCOUNTER — Telehealth (HOSPITAL_COMMUNITY): Payer: Self-pay | Admitting: *Deleted

## 2016-10-17 DIAGNOSIS — I509 Heart failure, unspecified: Secondary | ICD-10-CM

## 2016-10-17 NOTE — Telephone Encounter (Signed)
Surgical clearance faxed to [place] from [facility] on patient.  Dr. Haroldine Laws will not clear patient until she is seen in our clinic and has an echo.  Patient has not been seen in over 2 years.  I have faxed this information back to [facility] center and patient has been contacted by our office for a follow up and echo appointment.  Patient agrees and both have been scheduled for December 08, 2016. I will place order for Echo.

## 2016-10-24 ENCOUNTER — Other Ambulatory Visit (HOSPITAL_COMMUNITY): Payer: Self-pay | Admitting: Internal Medicine

## 2016-10-24 MED FILL — ISOSORBIDE MN ER 30 MG TAB: 30 | 30 days supply | Qty: 30 | Fill #2

## 2016-10-24 MED FILL — ATORVASTATIN 80 MG TABLET: 80 | 30 days supply | Qty: 30 | Fill #0

## 2016-10-24 MED FILL — CARVEDILOL 12.5 MG TABLET: 12.5 | 30 days supply | Qty: 60 | Fill #1

## 2016-10-24 MED FILL — LOSARTAN POTASSIUM 25 MG TA: 25 | 30 days supply | Qty: 30 | Fill #2

## 2016-10-24 MED FILL — WARFARIN SODIUM 6 MG TABLET: 6 | 30 days supply | Qty: 40 | Fill #1

## 2016-11-03 ENCOUNTER — Ambulatory Visit (INDEPENDENT_AMBULATORY_CARE_PROVIDER_SITE_OTHER): Payer: Medicare Other | Admitting: *Deleted

## 2016-11-03 DIAGNOSIS — Z7901 Long term (current) use of anticoagulants: Secondary | ICD-10-CM | POA: Diagnosis not present

## 2016-11-03 DIAGNOSIS — I253 Aneurysm of heart: Secondary | ICD-10-CM

## 2016-11-03 LAB — POCT INR: INR: 2

## 2016-12-04 ENCOUNTER — Ambulatory Visit (INDEPENDENT_AMBULATORY_CARE_PROVIDER_SITE_OTHER): Payer: Medicare Other | Admitting: *Deleted

## 2016-12-04 ENCOUNTER — Other Ambulatory Visit (HOSPITAL_COMMUNITY): Payer: Self-pay | Admitting: Internal Medicine

## 2016-12-04 DIAGNOSIS — I2510 Atherosclerotic heart disease of native coronary artery without angina pectoris: Secondary | ICD-10-CM

## 2016-12-04 DIAGNOSIS — Z7901 Long term (current) use of anticoagulants: Secondary | ICD-10-CM

## 2016-12-04 DIAGNOSIS — I253 Aneurysm of heart: Secondary | ICD-10-CM | POA: Diagnosis not present

## 2016-12-04 LAB — POCT INR: INR: 2.3

## 2016-12-04 MED FILL — ATORVASTATIN 80 MG TABLET: 80 | 30 days supply | Qty: 30 | Fill #1

## 2016-12-04 MED FILL — CARVEDILOL 12.5 MG TABLET: 12.5 | 30 days supply | Qty: 60 | Fill #2

## 2016-12-04 MED FILL — ISOSORBIDE MN ER 30 MG TAB: 30 | 30 days supply | Qty: 30 | Fill #3

## 2016-12-04 MED FILL — WARFARIN SODIUM 6 MG TABLET: 6 | 30 days supply | Qty: 40 | Fill #2

## 2016-12-08 ENCOUNTER — Ambulatory Visit (HOSPITAL_BASED_OUTPATIENT_CLINIC_OR_DEPARTMENT_OTHER)
Admission: RE | Admit: 2016-12-08 | Discharge: 2016-12-08 | Disposition: A | Discharge status: Home / Self Care | Payer: Medicare Other | Source: Ambulatory Visit | Attending: Internal Medicine | Admitting: Internal Medicine

## 2016-12-08 ENCOUNTER — Ambulatory Visit (HOSPITAL_COMMUNITY)
Admission: RE | Admit: 2016-12-08 | Discharge: 2016-12-08 | Disposition: A | Discharge status: Home / Self Care | Payer: Medicare Other | Source: Ambulatory Visit | Attending: Internal Medicine | Admitting: Internal Medicine

## 2016-12-08 VITALS — BP 152/96 | HR 57 | Wt 126.8 lb

## 2016-12-08 DIAGNOSIS — I252 Old myocardial infarction: Secondary | ICD-10-CM | POA: Diagnosis not present

## 2016-12-08 DIAGNOSIS — I110 Hypertensive heart disease with heart failure: Secondary | ICD-10-CM | POA: Insufficient documentation

## 2016-12-08 DIAGNOSIS — I2510 Atherosclerotic heart disease of native coronary artery without angina pectoris: Secondary | ICD-10-CM | POA: Diagnosis not present

## 2016-12-08 DIAGNOSIS — F17200 Nicotine dependence, unspecified, uncomplicated: Secondary | ICD-10-CM | POA: Insufficient documentation

## 2016-12-08 DIAGNOSIS — I509 Heart failure, unspecified: Secondary | ICD-10-CM | POA: Diagnosis not present

## 2016-12-08 DIAGNOSIS — I5022 Chronic systolic (congestive) heart failure: Secondary | ICD-10-CM | POA: Insufficient documentation

## 2016-12-08 DIAGNOSIS — I10 Essential (primary) hypertension: Secondary | ICD-10-CM

## 2016-12-08 DIAGNOSIS — D649 Anemia, unspecified: Secondary | ICD-10-CM | POA: Insufficient documentation

## 2016-12-08 DIAGNOSIS — I255 Ischemic cardiomyopathy: Secondary | ICD-10-CM | POA: Insufficient documentation

## 2016-12-08 DIAGNOSIS — I253 Aneurysm of heart: Secondary | ICD-10-CM

## 2016-12-08 DIAGNOSIS — Z7901 Long term (current) use of anticoagulants: Secondary | ICD-10-CM | POA: Diagnosis not present

## 2016-12-08 LAB — ECHOCARDIOGRAM COMPLETE
AVLVOTPG: 3 mmHg
CHL CUP DOP CALC LVOT VTI: 22 cm
CHL CUP MV DEC (S): 354
E decel time: 354 msec
E/e' ratio: 14.6
FS: 6 % — AB (ref 28–44)
IV/PV OW: 0.94
LA ID, A-P, ES: 44 mm
LA diam index: 2.86 cm/m2
LA vol A4C: 67.6 ml
LA vol index: 48.3 mL/m2
LAVOL: 74.4 mL
LDCA: 2.54 cm2
LEFT ATRIUM END SYS DIAM: 44 mm
LV E/e' medial: 14.6
LV E/e'average: 14.6
LV PW d: 9.48 mm — AB (ref 0.6–1.1)
LV TDI E'MEDIAL: 4.24
LVELAT: 6.85 cm/s
LVOT SV: 56 mL
LVOTD: 18 mm
LVOTPV: 82.5 cm/s
Lateral S' vel: 11.4 cm/s
MV VTI: 235 cm
MV pk E vel: 100 m/s
MVPG: 4 mmHg
MVPKAVEL: 117 m/s
PISA EROA: 0.07 cm2
TAPSE: 22.1 mm
TDI e' lateral: 6.85
Weight: 2028.8 oz

## 2016-12-08 LAB — CBC
HCT: 33.4 % — ABNORMAL LOW (ref 36.0–46.0)
Hemoglobin: 11.4 g/dL — ABNORMAL LOW (ref 12.0–15.0)
MCH: 29.4 pg (ref 26.0–34.0)
MCHC: 34.1 g/dL (ref 30.0–36.0)
MCV: 86.1 fL (ref 78.0–100.0)
PLATELETS: 269 10*3/uL (ref 150–400)
RBC: 3.88 MIL/uL (ref 3.87–5.11)
RDW: 13.5 % (ref 11.5–15.5)
WBC: 5.1 10*3/uL (ref 4.0–10.5)

## 2016-12-08 MED ORDER — LOSARTAN POTASSIUM 50 MG PO TABS: 50.0000 mg | ORAL_TABLET | Freq: Every day | ORAL | 6 refills | Status: DC

## 2016-12-08 MED FILL — LOSARTAN POTASSIUM 50 MG TA: 50 | 30 days supply | Qty: 30 | Fill #0

## 2016-12-08 NOTE — Progress Notes (Signed)
ADVANCED HEART FAILURE CLINIC NOTE  Patient ID: Faith Gray, female   DOB: 1940/11/22, 76 y.o.   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    Admitted to [facility] 12/13 for new onset acute systolic heart failure. Echo showed EF 25-30% with diffuse hypokinesis. Mild MR.   Cath 12/13:  Left main: Normal  LAD: Gives off 3 diagonals. Mild diffuse plaquing with 30-40% lesion in midsection. Otherwise normal.  LCX: Tiny ramus. Large OM-1 & OM-2. 30% lesion in ostial LCX. Otherwise mild plaque.  RCA: Dominant vessel with diffuse disease. Diffuse 30-40% through midsection. In distal RCA there is a ruptured plaque with about 60% stenosis. PDA moderate diffuse disease. In distal RCA just prior to take-off of PLs there is a very focal area of myocardial bridging with severe systolic compression.  LV-gram done in the RAO projection: Ejection fraction = 25-30%. There is a large aneurysm at the base to mid inferior wall. Anterior wall mild HK. No significant MR noted.  Admitted to [facility] 02/24/13-02/25/13 for near syncope after taking hydralazine early and jumping up quickly. Work up negative. ECHO EF improved to 40-45%, Severe hypokinesis base inferolateral. Akinesis base/mid inferior wall. 30 day Event monitor placed 03/11/13. This showed no significant arrhythmias.  She returns for follow up and pre-operative CV clearance. Walks to the store 3-4 blocks. Goes slow. No CP or SOB. No edema. No orthopnea or PND. Denies SOB/PND/Orthopnea. Weight at home 1126-127 pounds. No bleeding with coumadin. No PCP. Wants PCP. About to have root canal and extractions. Want clearance for sedation.   Echo today reviewed personally EF 40%  large  aneurysm at base of inferolateral wall.   Labs (8/14): K 4, creatinine 0.91 Labs (08/21/13): dig level 1.2 digoxin cut back to 0.0625 mg daily K 4.6 Creatinine 0.89   ROS: All systems negative except as listed in HPI, PMH and Problem List.  Past Medical History:  Diagnosis Date  . Anemia   . CAD (coronary artery disease) 09/11/2012  . CHF (congestive heart failure)   . Essential hypertension 10/04/2012  . NSTEMI (non-ST elevated myocardial infarction) 09/11/2012  . Shortness of breath    Current Meds  Medication Sig  . atorvastatin (LIPITOR) 80 MG tablet TAKE 1 TABLET BY MOUTH DAILY AT 6PM  . carvedilol (COREG) 12.5 MG tablet TAKE 1 TABLET BY MOUTH 2 TIMES DAILY WITH A MEAL  . furosemide (LASIX) 40 MG tablet TAKE 1 TABLET BY MOUTH DAILY  . hydrALAZINE (APRESOLINE) 50 MG tablet Take 1 tablet (50 mg total) by mouth 3 (three) times daily.  . isosorbide mononitrate (IMDUR) 30 MG 24 hr tablet TAKE 1 TABLET BY MOUTH ONCE DAILY  . losartan (COZAAR) 25 MG tablet Take 1 tablet (25 mg total) by mouth daily.  . polyethylene glycol powder (GLYCOLAX/MIRALAX) powder Take 17 g by mouth daily. (Patient taking differently: Take 17 g by mouth daily as needed. )  . warfarin (COUMADIN) 6 MG tablet TAKE AS DIRECTED BY COUMADIN CLINIC    No Known Allergies    PHYSICAL EXAM: Vitals:   12/08/16 1410  BP: (!) 152/96  Pulse: (!) 57  SpO2: 100%  Weight: 126 lb 12.8 oz (57.5 kg)    General:  Well appearing. No resp difficulty . Ambulated in the clinic without difficulty.  HEENT: normal. Anicteric. Poor dentition  Neck: supple. JVP flat. Carotids 2+ bilaterally; no bruits. No lymphadenopathy or thryomegaly appreciated. Cor: PMI normal. Regular rate & rhythm. No rubs, gallops. 2/6 SEM RUSB s2 preserved. Lungs: CTA no wheeze Abdomen: soft, nontender, nondistended. No hepatosplenomegaly. No bruits or masses. Good bowel sounds. Extremities: no cyanosis, clubbing, rash, edema.  RLE disfigured from  previous accident Neuro: alert & orientedx3, cranial nerves grossly intact. Moves all 4 extremities w/o difficulty. Affect pleasant.   ASSESSMENT & PLAN:  1) Chronic systolic HF: Ischemic cardiomyopathy. Echo reviewed with her today. Stable EF 40%  She has a large inferior wall aneurysm.   - Doing well. NYHA II-early II symptoms.  --Volume status status. - Continue lasix 40mg  daily - Continue carvedilol 12.5 mg . HR too low to go further - Increase losartan 50 mg dail She is not ace due to cough.  - Continue hydralazine to 50 mg tid and continue imdur 30 mg daily.  Reinforced daily weights, low salt food choices, and limiting fluid intake to < 2 liters per day.  2) CAD:  -- No ischemic symtpoms.  --No ASA with warfarin use.  --Continue atorvastain and b-blocker.  3) LV aneurysm:   - Felt to be high-risk for embolic phenomenon -> continue coumadin.  4) Pre-operative CV clearance. -She is low to moderate risk for peri-op CV complications. Can proceed to surgery without further testing. I prefer coumadin not be stopped but if need to stop would restart immediately.  5) HTN -BP has been high  -Increase losartan to 50 daily. F/u with PharmD in 3-4 weeks to titrate further as needed.  6) Smoking -encouraged to stop smoking.   [name] MD 12/08/2016

## 2016-12-08 NOTE — Progress Notes (Signed)
  Echocardiogram 2D Echocardiogram has been performed.  Faith Gray 12/08/2016, 2:57 PM

## 2016-12-08 NOTE — Progress Notes (Signed)
Advanced Heart Failure Medication Review by a Pharmacist  Does the patient  feel that his/her medications are working for him/her?  yes  Has the patient been experiencing any side effects to the medications prescribed?  no  Does the patient measure his/her own blood pressure or blood glucose at home?  yes - patient states has been running high, however she checks BP within 20 mins of taking BP meds - typically SBP ~150s, once in 170s  Does the patient have any problems obtaining medications due to transportation or finances?   yes - transportation is an issue, discussed pharmacies that deliver but advised to check co-pays prior to switch  Understanding of regimen: good Understanding of indications: good Potential of compliance: good Patient understands to avoid NSAIDs. Patient understands to avoid decongestants.  Issues to address at subsequent visits: None   Pharmacist comments: Faith Gray is a pleasant 76 yo AA female presenting to HF clinic with her daughter. She endorses compliance with regimen, however only took isosorbide this morning due to not eating much. Potential for noncompliance - understands importance of medications. No other questions or concerns.   Belia Heman, PharmD PGY1 Pharmacy Resident [phone] (Pager) 12/08/2016 2:17 PM  Time with patient: 10 min Preparation and documentation time: 5 min Total time: 15 min

## 2016-12-08 NOTE — Addendum Note (Signed)
Encounter addended by: Scarlette Calico, RN on: 12/08/2016  2:50 PM<BR>    Actions taken: Order list changed, Diagnosis association updated, Sign clinical note

## 2016-12-08 NOTE — Patient Instructions (Signed)
Start Losartan 50 mg daily  Labs today  Your physician recommends that you schedule a follow-up appointment in: 4 weeks with Doroteo Bradford, Pharm D  We will contact you in 6 months to schedule your next appointment.

## 2016-12-20 ENCOUNTER — Other Ambulatory Visit (HOSPITAL_COMMUNITY): Payer: Self-pay | Admitting: Internal Medicine

## 2016-12-21 MED FILL — hydrALAZINE HCL 50 MG TABS: 50 | 30 days supply | Qty: 90 | Fill #0

## 2017-01-03 ENCOUNTER — Inpatient Hospital Stay (HOSPITAL_COMMUNITY): Admission: RE | Admit: 2017-01-03 | Payer: Medicare Other | Source: Ambulatory Visit

## 2017-01-22 MED FILL — WARFARIN SODIUM 6 MG TABLET: 6 | 30 days supply | Qty: 40 | Fill #3

## 2017-01-22 MED FILL — LOSARTAN POTASSIUM 50 MG TA: 50 | 30 days supply | Qty: 30 | Fill #1

## 2017-01-22 MED FILL — ATORVASTATIN 80 MG TABLET: 80 | 30 days supply | Qty: 30 | Fill #2

## 2017-01-22 MED FILL — ISOSORBIDE MN ER 30 MG TAB: 30 | 30 days supply | Qty: 30 | Fill #4

## 2017-01-22 MED FILL — CARVEDILOL 12.5 MG TABLET: 12.5 | 30 days supply | Qty: 60 | Fill #3

## 2017-01-23 ENCOUNTER — Encounter (HOSPITAL_COMMUNITY): Payer: Medicare Other | Admitting: Internal Medicine

## 2017-01-25 ENCOUNTER — Encounter (INDEPENDENT_AMBULATORY_CARE_PROVIDER_SITE_OTHER): Payer: Self-pay

## 2017-01-25 ENCOUNTER — Ambulatory Visit (INDEPENDENT_AMBULATORY_CARE_PROVIDER_SITE_OTHER): Payer: Medicare Other | Admitting: *Deleted

## 2017-01-25 DIAGNOSIS — I253 Aneurysm of heart: Secondary | ICD-10-CM | POA: Diagnosis not present

## 2017-01-25 DIAGNOSIS — Z7901 Long term (current) use of anticoagulants: Secondary | ICD-10-CM | POA: Diagnosis not present

## 2017-01-25 LAB — POCT INR: INR: 2.5

## 2017-02-15 ENCOUNTER — Encounter (HOSPITAL_COMMUNITY): Payer: Self-pay | Admitting: Emergency Medicine

## 2017-02-15 NOTE — Anesthesia Preprocedure Evaluation (Addendum)
Anesthesia Evaluation  Patient identified by MRN, date of birth, ID band Patient awake    Reviewed: Allergy & Precautions, NPO status , Patient's Chart, lab work & pertinent test results  Airway Mallampati: II  TM Distance: >3 FB Neck ROM: Full    Dental   Pulmonary shortness of breath, former smoker,    breath sounds clear to auscultation       Cardiovascular hypertension, Pt. on medications and Pt. on home beta blockers + CAD, + Past MI and +CHF   Rhythm:Regular Rate:Normal  Left ventricle: The cavity size was moderately dilated. Systolicfunction was severely reduced. The estimated ejection fractionwas in the range of 25% to 30%. Diffuse hypokinesis. There isakinesis of the entire inferior myocardium with large aneurysmaldeformity of the basal inferior myocardium measuring 2.79 x3.9cm. There is akinesis of the basalinferoseptal myocardium.  Features are consistent with a pseudonormal left ventricular filling pattern, with concomitant abnormal relaxation andincreased filling pressure (grade 2 diastolic dysfunction).Doppler parameters are consistent with high ventricular fillingpressure. - Mitral valve: Calcified annulus. There was mild to moderateregurgitation. - Left atrium: The atrium was moderately to severely dilated.   Neuro/Psych negative neurological ROS     GI/Hepatic negative GI ROS, Neg liver ROS,   Endo/Other  negative endocrine ROS  Renal/GU negative Renal ROS     Musculoskeletal   Abdominal   Peds  Hematology negative hematology ROS (+)   Anesthesia Other Findings   Reproductive/Obstetrics                            Lab Results  Component Value Date   WBC 5.3 02/16/2017   HGB 9.9 (L) 02/16/2017   HCT 30.1 (L) 02/16/2017   MCV 83.6 02/16/2017   PLT 293 02/16/2017   Lab Results  Component Value Date   CREATININE 0.89 08/21/2013   BUN 11 08/21/2013   NA 138 08/21/2013    K 4.6 08/21/2013   CL 102 08/21/2013   CO2 28 08/21/2013   Lab Results  Component Value Date   INR 2.31 02/16/2017   INR 2.5 01/25/2017   INR 2.3 12/04/2016    Anesthesia Physical Anesthesia Plan  ASA: III  Anesthesia Plan: MAC   Post-op Pain Management:    Induction: Intravenous  Airway Management Planned: Natural Airway and Nasal Cannula  Additional Equipment:   Intra-op Plan:   Post-operative Plan:   Informed Consent: I have reviewed the patients History and Physical, chart, labs and discussed the procedure including the risks, benefits and alternatives for the proposed anesthesia with the patient or authorized representative who has indicated his/her understanding and acceptance.     Plan Discussed with: CRNA  Anesthesia Plan Comments:        Anesthesia Quick Evaluation

## 2017-02-15 NOTE — Progress Notes (Signed)
Anesthesia Chart Review:  Pt is a same day work up.  Pt is a 76 year old female scheduled for multiple dental extractions with alveoloplasty and 02/16/2017 with Diona Browner, [place] primary care at [facility] - Cardiologist is [name], MD, who cleared pt for surgery at last office visit 12/08/16  PMH includes: CAD, NSTEMI, CHF, ischemic cardiomyopathy, LV aneurysm (on coumadin), HTN, anemia. Former smoker.  Medications include: Lipitor, carvedilol, Lasix, hydralazine, Imdur, losartan, Coumadin. Pt to continue coumadin perioperatively.   Labs will be obtained DOS.    EKG will be obtained DOS.   Echo 12/08/16:  - Left ventricle: The cavity size was moderately dilated. Systolic function was severely reduced. The estimated ejection fraction was in the range of 25% to 30%. Diffuse hypokinesis. There is akinesis of the entire inferior myocardium with large aneurysmal deformity of the basal inferior myocardium measuring 2.79 x 3.9cm. There is akinesis of the basalinferoseptal myocardium.  Features are consistent with a pseudonormal left ventricular filling pattern, with concomitant abnormal relaxation and increased filling pressure (grade 2 diastolic dysfunction). Doppler parameters are consistent with high ventricular filling pressure. - Mitral valve: Calcified annulus. There was mild to moderate regurgitation. - Left atrium: The atrium was moderately to severely dilated.  Cardiac cath 09/10/12:  Left main: Normal  LAD: Gives off 3 diagonals. Mild diffuse plaquing with 30-40% lesion in midsection. Otherwise normal.  LCX: Tiny ramus. Large OM-1 & OM-2. 30% lesion in ostial LCX. Otherwise mild plaque.  RCA: Dominant vessel with diffuse disease. Diffuse 30-40% through midsection. In distal RCA there is a ruptured plaque with about 60% stenosis. PDA moderate diffuse disease. In distal RCA just prior to take-off of PLs there is a very focal area of myocardial  bridging with severe systolic compression.  LV-gram done in the RAO projection: Ejection fraction = 25-30%. There is a large aneurysm at the base to mid inferior wall. Anterior wall mild HK. No significant MR noted.  If EKG and labs acceptable DOS, I anticipate pt can proceed as scheduled.   Willeen Cass, FNP-BC [facility]/Anesthesiology Phone: [phone] 02/15/2017 3:32 PM

## 2017-02-15 NOTE — Progress Notes (Signed)
Pt denies SOB and chest pain. Pt under the care of Dr. Haroldine Laws, Cardiology. Pt denies having a stress test. Pt was instructed to continue Coumadin. Pt made aware to stop taking vitamins, fish oil and herbal medications. Do not take any NSAIDs ie: Ibuprofen, Advil, Naproxen, BC and Goody Powder. Pt verbalized understanding of all pre-op instructions. Anesthesia asked to review pt history ( see note).

## 2017-02-16 ENCOUNTER — Ambulatory Visit (HOSPITAL_COMMUNITY)
Admission: RE | Admit: 2017-02-16 | Discharge: 2017-02-16 | Disposition: A | Discharge status: Home / Self Care | Payer: Medicare Other | Source: Ambulatory Visit | Attending: Oral Surgery | Admitting: Oral Surgery

## 2017-02-16 ENCOUNTER — Ambulatory Visit (HOSPITAL_COMMUNITY): Payer: Medicare Other | Admitting: Emergency Medicine

## 2017-02-16 ENCOUNTER — Encounter (HOSPITAL_COMMUNITY): Payer: Self-pay | Admitting: Certified Registered Nurse Anesthetist

## 2017-02-16 ENCOUNTER — Encounter (HOSPITAL_COMMUNITY)
Admission: RE | Disposition: A | Discharge status: Home / Self Care | Payer: Self-pay | Source: Ambulatory Visit | Attending: Oral Surgery

## 2017-02-16 DIAGNOSIS — K0530 Chronic periodontitis, unspecified: Secondary | ICD-10-CM | POA: Diagnosis not present

## 2017-02-16 DIAGNOSIS — Z87891 Personal history of nicotine dependence: Secondary | ICD-10-CM | POA: Diagnosis not present

## 2017-02-16 DIAGNOSIS — I5022 Chronic systolic (congestive) heart failure: Secondary | ICD-10-CM | POA: Diagnosis not present

## 2017-02-16 DIAGNOSIS — Z7901 Long term (current) use of anticoagulants: Secondary | ICD-10-CM | POA: Insufficient documentation

## 2017-02-16 DIAGNOSIS — I253 Aneurysm of heart: Secondary | ICD-10-CM | POA: Insufficient documentation

## 2017-02-16 DIAGNOSIS — I255 Ischemic cardiomyopathy: Secondary | ICD-10-CM | POA: Insufficient documentation

## 2017-02-16 DIAGNOSIS — I2510 Atherosclerotic heart disease of native coronary artery without angina pectoris: Secondary | ICD-10-CM | POA: Insufficient documentation

## 2017-02-16 DIAGNOSIS — I10 Essential (primary) hypertension: Secondary | ICD-10-CM | POA: Diagnosis not present

## 2017-02-16 DIAGNOSIS — K029 Dental caries, unspecified: Secondary | ICD-10-CM | POA: Diagnosis not present

## 2017-02-16 DIAGNOSIS — I340 Nonrheumatic mitral (valve) insufficiency: Secondary | ICD-10-CM | POA: Diagnosis not present

## 2017-02-16 DIAGNOSIS — I252 Old myocardial infarction: Secondary | ICD-10-CM | POA: Diagnosis not present

## 2017-02-16 HISTORY — DX: Ischemic cardiomyopathy: I25.5

## 2017-02-16 HISTORY — DX: Unspecified osteoarthritis, unspecified site: M19.90

## 2017-02-16 HISTORY — DX: Dental caries, unspecified: K02.9

## 2017-02-16 HISTORY — PX: MULTIPLE EXTRACTIONS WITH ALVEOLOPLASTY: SHX5342

## 2017-02-16 LAB — BASIC METABOLIC PANEL
Anion gap: 10 (ref 5–15)
BUN: 14 mg/dL (ref 6–20)
CHLORIDE: 107 mmol/L (ref 101–111)
CO2: 20 mmol/L — ABNORMAL LOW (ref 22–32)
CREATININE: 1.14 mg/dL — AB (ref 0.44–1.00)
Calcium: 8.9 mg/dL (ref 8.9–10.3)
GFR calc Af Amer: 53 mL/min — ABNORMAL LOW (ref >60)
GFR calc non Af Amer: 46 mL/min — ABNORMAL LOW (ref >60)
GLUCOSE: 108 mg/dL — AB (ref 65–99)
POTASSIUM: 4.1 mmol/L (ref 3.5–5.1)
SODIUM: 137 mmol/L (ref 135–145)

## 2017-02-16 LAB — CBC
HEMATOCRIT: 30.1 % — AB (ref 36.0–46.0)
Hemoglobin: 9.9 g/dL — ABNORMAL LOW (ref 12.0–15.0)
MCH: 27.5 pg (ref 26.0–34.0)
MCHC: 32.9 g/dL (ref 30.0–36.0)
MCV: 83.6 fL (ref 78.0–100.0)
PLATELETS: 293 10*3/uL (ref 150–400)
RBC: 3.6 MIL/uL — ABNORMAL LOW (ref 3.87–5.11)
RDW: 13.3 % (ref 11.5–15.5)
WBC: 5.3 10*3/uL (ref 4.0–10.5)

## 2017-02-16 LAB — PROTIME-INR
INR: 2.31
Prothrombin Time: 25.8 seconds — ABNORMAL HIGH (ref 11.4–15.2)

## 2017-02-16 SURGERY — MULTIPLE EXTRACTION WITH ALVEOLOPLASTY
Anesthesia: Monitor Anesthesia Care | Site: Mouth

## 2017-02-16 MED ORDER — HEMOSTATIC AGENTS (NO CHARGE) OPTIME
TOPICAL | Status: DC | PRN
  Administered 2017-02-16: 1 via TOPICAL

## 2017-02-16 MED ORDER — PROPOFOL 10 MG/ML IV BOLUS
INTRAVENOUS | Status: DC | PRN
  Administered 2017-02-16 (×2): 10 mg via INTRAVENOUS

## 2017-02-16 MED ORDER — 0.9 % SODIUM CHLORIDE (POUR BTL) OPTIME
TOPICAL | Status: DC | PRN
  Administered 2017-02-16: 1000 mL

## 2017-02-16 MED ORDER — OXYCODONE-ACETAMINOPHEN 5-325 MG PO TABS: 1.0000 | ORAL_TABLET | ORAL | 0 refills | Status: DC | PRN

## 2017-02-16 MED ORDER — LIDOCAINE-EPINEPHRINE 2 %-1:100000 IJ SOLN
INTRAMUSCULAR | Status: DC | PRN
  Administered 2017-02-16: 13 mL

## 2017-02-16 MED ORDER — LACTATED RINGERS IV SOLN
INTRAVENOUS | Status: DC | PRN
  Administered 2017-02-16: 08:00:00 via INTRAVENOUS

## 2017-02-16 MED ORDER — CLINDAMYCIN PHOSPHATE 600 MG/50ML IV SOLN
INTRAVENOUS | Status: DC | PRN
  Administered 2017-02-16: 600 mg via INTRAVENOUS

## 2017-02-16 MED ORDER — ONDANSETRON HCL 4 MG PO TABS: 4.0000 mg | ORAL_TABLET | Freq: Three times a day (TID) | ORAL | 0 refills | Status: DC | PRN

## 2017-02-16 MED ORDER — FENTANYL CITRATE (PF) 250 MCG/5ML IJ SOLN
INTRAMUSCULAR | Status: AC
  Filled 2017-02-16: qty 5

## 2017-02-16 MED ORDER — PROPOFOL 10 MG/ML IV BOLUS
INTRAVENOUS | Status: AC
  Filled 2017-02-16: qty 20

## 2017-02-16 MED ORDER — LIDOCAINE-EPINEPHRINE 2 %-1:100000 IJ SOLN
INTRAMUSCULAR | Status: AC
  Filled 2017-02-16: qty 1

## 2017-02-16 MED ORDER — PROPOFOL 500 MG/50ML IV EMUL
INTRAVENOUS | Status: DC | PRN
  Administered 2017-02-16: 20 ug/kg/min via INTRAVENOUS

## 2017-02-16 MED ORDER — LIDOCAINE HCL (CARDIAC) 20 MG/ML IV SOLN
INTRAVENOUS | Status: DC | PRN
  Administered 2017-02-16: 40 mg via INTRAVENOUS

## 2017-02-16 SURGICAL SUPPLY — 33 items
BUR CROSS CUT FISSURE 1.6 (BURR) ×2 IMPLANT
BUR CROSS CUT FISSURE 1.6MM (BURR) ×1
BUR EGG ELITE 4.0 (BURR) ×2 IMPLANT
BUR EGG ELITE 4.0MM (BURR) ×1
CANISTER SUCT 3000ML PPV (MISCELLANEOUS) ×3 IMPLANT
COVER SURGICAL LIGHT HANDLE (MISCELLANEOUS) ×3 IMPLANT
CRADLE DONUT ADULT HEAD (MISCELLANEOUS) ×3 IMPLANT
DECANTER SPIKE VIAL GLASS SM (MISCELLANEOUS) ×3 IMPLANT
DRAPE U-SHAPE 76X120 STRL (DRAPES) ×3 IMPLANT
FLUID NSS /IRRIG 1000 ML XXX (MISCELLANEOUS) ×3 IMPLANT
GAUZE PACKING FOLDED 2  STR (GAUZE/BANDAGES/DRESSINGS) ×2
GAUZE PACKING FOLDED 2 STR (GAUZE/BANDAGES/DRESSINGS) ×1 IMPLANT
GLOVE BIO SURGEON STRL SZ 6.5 (GLOVE) ×2 IMPLANT
GLOVE BIO SURGEON STRL SZ7.5 (GLOVE) ×3 IMPLANT
GLOVE BIO SURGEONS STRL SZ 6.5 (GLOVE) ×1
GLOVE BIOGEL PI IND STRL 7.0 (GLOVE) ×1 IMPLANT
GLOVE BIOGEL PI INDICATOR 7.0 (GLOVE) ×2
GOWN STRL REUS W/ TWL LRG LVL3 (GOWN DISPOSABLE) ×1 IMPLANT
GOWN STRL REUS W/ TWL XL LVL3 (GOWN DISPOSABLE) ×1 IMPLANT
GOWN STRL REUS W/TWL LRG LVL3 (GOWN DISPOSABLE) ×2
GOWN STRL REUS W/TWL XL LVL3 (GOWN DISPOSABLE) ×2
KIT BASIN OR (CUSTOM PROCEDURE TRAY) ×3 IMPLANT
KIT ROOM TURNOVER OR (KITS) ×3 IMPLANT
NEEDLE 22X1 1/2 (OR ONLY) (NEEDLE) ×6 IMPLANT
NEEDLE 27GAX1X1/2 (NEEDLE) ×6 IMPLANT
NS IRRIG 1000ML POUR BTL (IV SOLUTION) ×3 IMPLANT
PAD ARMBOARD 7.5X6 YLW CONV (MISCELLANEOUS) ×6 IMPLANT
SPONGE SURGIFOAM ABS GEL SZ50 (HEMOSTASIS) ×3 IMPLANT
SUT CHROMIC 3 0 PS 2 (SUTURE) ×6 IMPLANT
SYR CONTROL 10ML LL (SYRINGE) ×3 IMPLANT
TRAY ENT MC OR (CUSTOM PROCEDURE TRAY) ×3 IMPLANT
TUBING IRRIGATION (MISCELLANEOUS) ×3 IMPLANT
YANKAUER SUCT BULB TIP NO VENT (SUCTIONS) ×3 IMPLANT

## 2017-02-16 NOTE — Op Note (Signed)
02/16/2017  8:08 AM  PATIENT:  Faith Gray  76 y.o. female  PRE-OPERATIVE DIAGNOSIS: Nonrestorable teeth # 12, 13, 14, 15, 20, 21, 29, 30, 31 secondary to  Dental Caries, periodontitis  POST-OPERATIVE DIAGNOSIS:  SAME  PROCEDURE:  Procedure(s): MULTIPLE EXTRACTION teeth # 12, 13, 14, 15, 20, 21, 29, 30, 31  SURGEON:  Surgeon(s): Hoyt Koch, Nina, DDS  ANESTHESIA:   local and MAC  EBL:  minimal  DRAINS: none   SPECIMEN:  No Specimen  COUNTS:  YES  PLAN OF CARE: Discharge to home after PACU  PATIENT DISPOSITION:  PACU - hemodynamically stable.   PROCEDURE DETAILS: Dictation # [id number]  Gae Bon, DMD 02/16/2017 8:08 AM

## 2017-02-16 NOTE — Op Note (Signed)
NAME:  Faith Gray, Faith Gray               ACCOUNT NO.:  [id number]  MEDICAL RECORD NO.:  [id number]  LOCATION:                                 FACILITY:  PHYSICIAN:  Gae Bon, M.D.       DATE OF BIRTH:  DATE OF PROCEDURE:  02/16/2017 DATE OF DISCHARGE:                              OPERATIVE REPORT   PREOPERATIVE DIAGNOSIS:  Nonrestorable teeth numbers 12, 13, 14, 15, 20, 21, 29, 30, 31, secondary to dental caries and periodontitis.  POSTOPERATIVE DIAGNOSIS:  Nonrestorable teeth numbers 12, 13, 14, 15, 20, 21, 29, 30, 31, secondary to dental caries and periodontitis.  PROCEDURE:  Extraction of teeth numbers 12, 13, 14, 15, 20, 21, 29, 30, and 31.  SURGEON:  Gae Bon, M.D.  ANESTHESIA:  Local and MAC.  DESCRIPTION OF PROCEDURE:  The patient was taken to the operating room, placed on the table in supine position.  The patient was draped for the procedure and time-out was performed.  A throat screen was placed and 2% lidocaine and 1:100000 epinephrine was infiltrated in an inferior alveolar block on the right and left sides and in buccal and palatal infiltration in the left maxilla.  After allowing time for local anesthesia to sink in.  A bite block was placed on the right side of the mouth and a sweetheart retractor was used to retract the tongue.  Throat screen was in place.  A 15 blade was used to make an incision around teeth numbers 12, 13, 14, 15 both buccally and palatally in the gingival sulcus.  The teeth were removed using a periosteal elevator, 301 elevator, and dental forceps.  The teeth were removed uneventfully.  The sockets were curetted, irrigated.  Bone was smoothed with a bone file and Gel-Foam palates were placed into the extraction sockets and the area was sutured with 3-0 chromic.  Then, a 15 blade was used to make an incision around teeth numbers 20 and 21.  The periosteum was reflected with a periosteal elevator.  The teeth were elevated and removed  with the Asch forceps.  The sockets were curetted and then Gel-Foam was placed in the sockets as well.  Then, the bite block and sweetheart retractor repositioned to the side of the mouth.  A 15 blade was used to make an incision on teeth numbers 29, 30, 31 in the gingival sulcus buccally and lingually.  Periosteum was reflected with a periosteal elevator.  The teeth were elevated with a 301 elevator and removed from the mouth with lower universal forceps.  The sockets were curetted. Granulation tissue was removed and then the area was irrigated.  Gelfoam was placed and the area was sutured with 3-0 chromic.  Then, the bite block and sweetheart and throat pack were removed.  The patient was left in the care of Anesthesia for transportation to recovery and later discharged home.  ESTIMATED BLOOD LOSS:  Minimal.  COMPLICATIONS:  None.  SPECIMENS:  None.     Gae Bon, M.D.   ______________________________ Gae Bon, M.D.    SMJ/MEDQ  D:  02/16/2017  T:  02/16/2017  Job:  [id number]

## 2017-02-16 NOTE — Anesthesia Postprocedure Evaluation (Signed)
Anesthesia Post Note  Patient: Faith Gray  Procedure(s) Performed: Procedure(s) (LRB): MULTIPLE EXTRACTION WITH ALVEOLOPLASTY (N/A)     Patient location during evaluation: PACU Anesthesia Type: MAC Level of consciousness: awake and alert Pain management: pain level controlled Vital Signs Assessment: post-procedure vital signs reviewed and stable Respiratory status: spontaneous breathing, nonlabored ventilation, respiratory function stable and patient connected to nasal cannula oxygen Cardiovascular status: stable and blood pressure returned to baseline Anesthetic complications: no    Last Vitals:  Vitals:   02/16/17 0837 02/16/17 0839  BP:  (!) 144/83  Pulse: 62 69  Resp: (!) 21 (!) 22  Temp: 36.3 C     Last Pain:  Vitals:   02/16/17 0706  TempSrc: Oral                 Tiajuana Amass

## 2017-02-16 NOTE — Transfer of Care (Signed)
Immediate Anesthesia Transfer of Care Note  Patient: Faith Gray  Procedure(s) Performed: Procedure(s): MULTIPLE EXTRACTION WITH ALVEOLOPLASTY (N/A)  Patient Location: PACU  Anesthesia Type:MAC  Level of Consciousness: awake, alert , oriented and patient cooperative  Airway & Oxygen Therapy: Patient Spontanous Breathing  Post-op Assessment: Report given to RN and Post -op Vital signs reviewed and stable  Post vital signs: Reviewed and stable  Last Vitals:  Vitals:   02/16/17 0706  BP: (!) 150/68  Pulse: 64  Resp: 18  Temp: 37.3 C    Last Pain:  Vitals:   02/16/17 0706  TempSrc: Oral      Patients Stated Pain Goal: 2 (50/56/97 9480)  Complications: No apparent anesthesia complications

## 2017-02-16 NOTE — H&P (Addendum)
HISTORY AND PHYSICAL  Faith Gray is a 76 y.o. female patient with CC: painful teeth. Referred by general dentist for multiple extractions  No diagnosis found.  Past Medical History:  Diagnosis Date  . Anemia   . Arthritis   . CAD (coronary artery disease) 09/11/2012  . CHF (congestive heart failure) ([place])   . Dental caries   . Essential hypertension 10/04/2012  . Ischemic cardiomyopathy   . NSTEMI (non-ST elevated myocardial infarction) ([place]) 09/11/2012  . Shortness of breath     No current facility-administered medications for this encounter.    Allergies  Allergen Reactions  . Vicodin [Hydrocodone-Acetaminophen] Nausea And Vomiting   Active Problems:   * No active hospital problems. *  Vitals: Blood pressure (!) 150/68, pulse 64, temperature 99.1 F (37.3 C), temperature source Oral, resp. rate 18, SpO2 99 %. Lab results: Results for orders placed or performed during the hospital encounter of 02/16/17 (from the past 24 hour(s))  CBC     Status: Abnormal   Collection Time: 02/16/17  6:46 AM  Result Value Ref Range   WBC 5.3 4.0 - 10.5 K/uL   RBC 3.60 (L) 3.87 - 5.11 MIL/uL   Hemoglobin 9.9 (L) 12.0 - 15.0 g/dL   HCT 30.1 (L) 36.0 - 46.0 %   MCV 83.6 78.0 - 100.0 fL   MCH 27.5 26.0 - 34.0 pg   MCHC 32.9 30.0 - 36.0 g/dL   RDW 13.3 11.5 - 15.5 %   Platelets 293 150 - 400 K/uL  PT- INR at PAT visit (Pre-admission Testing)     Status: Abnormal   Collection Time: 02/16/17  6:46 AM  Result Value Ref Range   Prothrombin Time 25.8 (H) 11.4 - 15.2 seconds   INR 2.31    Radiology Results: No results found. General appearance: alert, cooperative and no distress Head: Normocephalic, without obvious abnormality, atraumatic Eyes: negative Nose: Nares normal. Septum midline. Mucosa normal. No drainage or sinus tenderness. Throat: multiple carious teeth. No purulence, fluctuance, edema, lesions. Pharynx clear. Neck: no adenopathy, supple, symmetrical, trachea midline and  thyroid not enlarged, symmetric, no tenderness/mass/nodules Resp: clear to auscultation bilaterally Cardio: regular rate and rhythm, S1, S2 normal, no murmur, click, rub or gallop  Assessment: Nonrestorable teeth secondary to dental caries  Plan: Multiple dental extractions with alveoloplasty. MAC vs GA. Day surgery.   Jun Rightmyer M 02/16/2017

## 2017-02-17 ENCOUNTER — Encounter (HOSPITAL_COMMUNITY): Payer: Self-pay | Admitting: Oral Surgery

## 2017-03-07 MED FILL — LOSARTAN POTASSIUM 50 MG TA: 50 | 30 days supply | Qty: 30 | Fill #2

## 2017-03-07 MED FILL — CARVEDILOL 12.5 MG TABLET: 12.5 | 30 days supply | Qty: 60 | Fill #4

## 2017-03-07 MED FILL — ISOSORBIDE MN ER 30 MG TAB: 30 | 30 days supply | Qty: 30 | Fill #5

## 2017-03-08 ENCOUNTER — Ambulatory Visit (INDEPENDENT_AMBULATORY_CARE_PROVIDER_SITE_OTHER): Payer: Medicare Other | Admitting: *Deleted

## 2017-03-08 DIAGNOSIS — Z7901 Long term (current) use of anticoagulants: Secondary | ICD-10-CM | POA: Diagnosis not present

## 2017-03-08 DIAGNOSIS — I253 Aneurysm of heart: Secondary | ICD-10-CM | POA: Diagnosis not present

## 2017-03-08 DIAGNOSIS — I2510 Atherosclerotic heart disease of native coronary artery without angina pectoris: Secondary | ICD-10-CM

## 2017-03-08 LAB — POCT INR: INR: 2.2

## 2017-03-29 ENCOUNTER — Other Ambulatory Visit: Payer: Self-pay | Admitting: Internal Medicine

## 2017-03-29 DIAGNOSIS — I253 Aneurysm of heart: Secondary | ICD-10-CM

## 2017-04-12 MED FILL — LOSARTAN POTASSIUM 50 MG TA: 50 | 30 days supply | Qty: 30 | Fill #3

## 2017-04-12 MED FILL — ATORVASTATIN 80 MG TABLET: 80 | 30 days supply | Qty: 30 | Fill #3

## 2017-04-12 MED FILL — CARVEDILOL 12.5 MG TABLET: 12.5 | 30 days supply | Qty: 60 | Fill #5

## 2017-04-12 MED FILL — ISOSORBIDE MN ER 30 MG TAB: 30 | 30 days supply | Qty: 30 | Fill #6

## 2017-04-12 MED FILL — hydrALAZINE HCL 50 MG TABS: 50 | 30 days supply | Qty: 90 | Fill #1

## 2017-04-12 MED FILL — WARFARIN SODIUM 6 MG TABLET: 6 | 30 days supply | Qty: 40 | Fill #0

## 2017-04-24 ENCOUNTER — Other Ambulatory Visit: Payer: Self-pay | Admitting: Internal Medicine

## 2017-04-25 ENCOUNTER — Ambulatory Visit (INDEPENDENT_AMBULATORY_CARE_PROVIDER_SITE_OTHER): Payer: Medicare Other

## 2017-04-25 DIAGNOSIS — I253 Aneurysm of heart: Secondary | ICD-10-CM

## 2017-04-25 DIAGNOSIS — Z7901 Long term (current) use of anticoagulants: Secondary | ICD-10-CM | POA: Diagnosis not present

## 2017-04-25 DIAGNOSIS — I2510 Atherosclerotic heart disease of native coronary artery without angina pectoris: Secondary | ICD-10-CM

## 2017-04-25 LAB — POCT INR: INR: 2.2

## 2017-04-25 MED FILL — FUROSEMIDE 40 MG TABLET: 40 | 90 days supply | Qty: 90 | Fill #0

## 2017-05-25 MED FILL — LOSARTAN POTASSIUM 50 MG TA: 50 | 30 days supply | Qty: 30 | Fill #4

## 2017-05-25 MED FILL — ATORVASTATIN 80 MG TABLET: 80 | 30 days supply | Qty: 30 | Fill #4

## 2017-05-25 MED FILL — WARFARIN SODIUM 6 MG TABLET: 6 | 30 days supply | Qty: 40 | Fill #1

## 2017-05-25 MED FILL — ISOSORBIDE MN ER 30 MG TAB: 30 | 30 days supply | Qty: 30 | Fill #7

## 2017-06-06 ENCOUNTER — Ambulatory Visit (INDEPENDENT_AMBULATORY_CARE_PROVIDER_SITE_OTHER): Payer: Medicare Other | Admitting: *Deleted

## 2017-06-06 DIAGNOSIS — Z5181 Encounter for therapeutic drug level monitoring: Secondary | ICD-10-CM | POA: Diagnosis not present

## 2017-06-06 DIAGNOSIS — I253 Aneurysm of heart: Secondary | ICD-10-CM

## 2017-06-06 DIAGNOSIS — Z7901 Long term (current) use of anticoagulants: Secondary | ICD-10-CM

## 2017-06-06 LAB — POCT INR: INR: 1.7

## 2017-06-13 ENCOUNTER — Encounter (HOSPITAL_COMMUNITY): Payer: Medicare Other | Admitting: Internal Medicine

## 2017-06-22 ENCOUNTER — Ambulatory Visit (INDEPENDENT_AMBULATORY_CARE_PROVIDER_SITE_OTHER): Payer: Medicare Other

## 2017-06-22 DIAGNOSIS — Z7901 Long term (current) use of anticoagulants: Secondary | ICD-10-CM | POA: Diagnosis not present

## 2017-06-22 DIAGNOSIS — I253 Aneurysm of heart: Secondary | ICD-10-CM

## 2017-06-22 LAB — POCT INR: INR: 3.8

## 2017-07-05 MED FILL — CARVEDILOL 12.5 MG TABLET: 12.5 | 30 days supply | Qty: 60 | Fill #6

## 2017-07-05 MED FILL — WARFARIN SODIUM 6 MG TABLET: 6 | 30 days supply | Qty: 40 | Fill #2

## 2017-07-05 MED FILL — LOSARTAN POTASSIUM 50 MG TA: 50 | 30 days supply | Qty: 30 | Fill #5

## 2017-07-05 MED FILL — ISOSORBIDE MN ER 30 MG TAB: 30 | 30 days supply | Qty: 30 | Fill #8

## 2017-07-06 ENCOUNTER — Ambulatory Visit (INDEPENDENT_AMBULATORY_CARE_PROVIDER_SITE_OTHER): Payer: Medicare Other | Admitting: *Deleted

## 2017-07-06 DIAGNOSIS — I253 Aneurysm of heart: Secondary | ICD-10-CM | POA: Diagnosis not present

## 2017-07-06 DIAGNOSIS — Z7901 Long term (current) use of anticoagulants: Secondary | ICD-10-CM | POA: Diagnosis not present

## 2017-07-06 DIAGNOSIS — Z5181 Encounter for therapeutic drug level monitoring: Secondary | ICD-10-CM | POA: Diagnosis not present

## 2017-07-06 LAB — POCT INR: INR: 1.5

## 2017-07-20 ENCOUNTER — Ambulatory Visit (INDEPENDENT_AMBULATORY_CARE_PROVIDER_SITE_OTHER): Payer: Medicare Other | Admitting: *Deleted

## 2017-07-20 DIAGNOSIS — Z5181 Encounter for therapeutic drug level monitoring: Secondary | ICD-10-CM

## 2017-07-20 DIAGNOSIS — Z7901 Long term (current) use of anticoagulants: Secondary | ICD-10-CM | POA: Diagnosis not present

## 2017-07-20 DIAGNOSIS — I253 Aneurysm of heart: Secondary | ICD-10-CM | POA: Diagnosis not present

## 2017-07-20 LAB — POCT INR: INR: 4.3

## 2017-08-02 MED FILL — hydrALAZINE HCL 50 MG TABS: 50 | 30 days supply | Qty: 90 | Fill #2

## 2017-08-02 MED FILL — WARFARIN SODIUM 6 MG TABLET: 6 | 30 days supply | Qty: 40 | Fill #3

## 2017-08-02 MED FILL — LOSARTAN POTASSIUM 50 MG TA: 50 | 30 days supply | Qty: 30 | Fill #6

## 2017-08-02 MED FILL — ATORVASTATIN 80 MG TABLET: 80 | 30 days supply | Qty: 30 | Fill #5

## 2017-08-02 MED FILL — ISOSORBIDE MN ER 30 MG TAB: 30 | 30 days supply | Qty: 30 | Fill #9

## 2017-08-02 MED FILL — FUROSEMIDE 40 MG TAB: 40 | 90 days supply | Qty: 90 | Fill #1

## 2017-08-02 MED FILL — CARVEDILOL 12.5 MG TABLET: 12.5 | 30 days supply | Qty: 60 | Fill #7

## 2017-08-03 ENCOUNTER — Ambulatory Visit (INDEPENDENT_AMBULATORY_CARE_PROVIDER_SITE_OTHER): Payer: Medicare Other | Admitting: *Deleted

## 2017-08-03 DIAGNOSIS — Z7901 Long term (current) use of anticoagulants: Secondary | ICD-10-CM | POA: Diagnosis not present

## 2017-08-03 DIAGNOSIS — I253 Aneurysm of heart: Secondary | ICD-10-CM | POA: Diagnosis not present

## 2017-08-03 LAB — POCT INR: INR: 1.6

## 2017-08-03 NOTE — Patient Instructions (Signed)
Today take 1.5 tablets then continue  same dosage of coumadin 1 tablet every day except 1/2 tablet on Sundays and Thursdays. Recheck in 2 weeks. Call with any questions of concerns [phone]

## 2017-08-21 ENCOUNTER — Encounter (HOSPITAL_COMMUNITY): Payer: Medicare Other | Admitting: Internal Medicine

## 2017-08-24 ENCOUNTER — Other Ambulatory Visit: Payer: Self-pay | Admitting: Physician Assistant

## 2017-08-24 ENCOUNTER — Ambulatory Visit (HOSPITAL_COMMUNITY)
Admission: RE | Admit: 2017-08-24 | Discharge: 2017-08-24 | Disposition: A | Discharge status: Home / Self Care | Payer: Medicare Other | Source: Ambulatory Visit | Attending: Internal Medicine | Admitting: Internal Medicine

## 2017-08-24 VITALS — BP 148/82 | HR 80 | Wt 113.4 lb

## 2017-08-24 DIAGNOSIS — F17210 Nicotine dependence, cigarettes, uncomplicated: Secondary | ICD-10-CM | POA: Diagnosis not present

## 2017-08-24 DIAGNOSIS — Q245 Malformation of coronary vessels: Secondary | ICD-10-CM | POA: Insufficient documentation

## 2017-08-24 DIAGNOSIS — I10 Essential (primary) hypertension: Secondary | ICD-10-CM

## 2017-08-24 DIAGNOSIS — Z720 Tobacco use: Secondary | ICD-10-CM | POA: Diagnosis not present

## 2017-08-24 DIAGNOSIS — I252 Old myocardial infarction: Secondary | ICD-10-CM | POA: Insufficient documentation

## 2017-08-24 DIAGNOSIS — R55 Syncope and collapse: Secondary | ICD-10-CM | POA: Insufficient documentation

## 2017-08-24 DIAGNOSIS — Z79899 Other long term (current) drug therapy: Secondary | ICD-10-CM | POA: Insufficient documentation

## 2017-08-24 DIAGNOSIS — I253 Aneurysm of heart: Secondary | ICD-10-CM | POA: Diagnosis not present

## 2017-08-24 DIAGNOSIS — Z7901 Long term (current) use of anticoagulants: Secondary | ICD-10-CM | POA: Diagnosis not present

## 2017-08-24 DIAGNOSIS — I110 Hypertensive heart disease with heart failure: Secondary | ICD-10-CM | POA: Diagnosis not present

## 2017-08-24 DIAGNOSIS — I255 Ischemic cardiomyopathy: Secondary | ICD-10-CM | POA: Insufficient documentation

## 2017-08-24 DIAGNOSIS — I5022 Chronic systolic (congestive) heart failure: Secondary | ICD-10-CM

## 2017-08-24 DIAGNOSIS — I2510 Atherosclerotic heart disease of native coronary artery without angina pectoris: Secondary | ICD-10-CM

## 2017-08-24 DIAGNOSIS — Z885 Allergy status to narcotic agent status: Secondary | ICD-10-CM | POA: Insufficient documentation

## 2017-08-24 LAB — BASIC METABOLIC PANEL
Anion gap: 6 (ref 5–15)
BUN: 10 mg/dL (ref 6–20)
CHLORIDE: 108 mmol/L (ref 101–111)
CO2: 25 mmol/L (ref 22–32)
Calcium: 9.2 mg/dL (ref 8.9–10.3)
Creatinine, Ser: 1.25 mg/dL — ABNORMAL HIGH (ref 0.44–1.00)
GFR calc Af Amer: 48 mL/min — ABNORMAL LOW (ref >60)
GFR calc non Af Amer: 41 mL/min — ABNORMAL LOW (ref >60)
Glucose, Bld: 87 mg/dL (ref 65–99)
POTASSIUM: 5.2 mmol/L — AB (ref 3.5–5.1)
SODIUM: 139 mmol/L (ref 135–145)

## 2017-08-24 MED ORDER — SACUBITRIL-VALSARTAN 24-26 MG PO TABS: 1.0000 | ORAL_TABLET | Freq: Two times a day (BID) | ORAL | 3 refills | Status: DC

## 2017-08-24 NOTE — Patient Instructions (Signed)
Stop Losartan  Start Entresto 24/26 mg Twice daily   Lab today  Please STOP SMOKING  Your physician recommends that you schedule a follow-up appointment in: 3 months with echocardiogram

## 2017-08-24 NOTE — Progress Notes (Signed)
ADVANCED HEART FAILURE CLINIC NOTE  Patient ID: Faith Gray, female   DOB: 07/11/1941, 76 y.o.   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    Admitted to [facility] 12/13 for new onset acute systolic heart failure. Echo showed EF 25-30% with diffuse hypokinesis. Mild MR.   Cath 12/13:  Left main: Normal  LAD: Gives off 3 diagonals. Mild diffuse plaquing with 30-40% lesion in midsection. Otherwise normal.  LCX: Tiny ramus. Large OM-1 & OM-2. 30% lesion in ostial LCX. Otherwise mild plaque.  RCA: Dominant vessel with diffuse disease. Diffuse 30-40% through midsection. In distal RCA there is a ruptured plaque with about 60% stenosis. PDA moderate diffuse disease. In distal RCA just prior to take-off of PLs there is a very focal area of myocardial bridging with severe systolic compression.  LV-gram done in the RAO projection: Ejection fraction = 25-30%. There is a large aneurysm at the base to mid inferior wall. Anterior wall mild HK. No significant MR noted.  Admitted to [facility] 02/24/13-02/25/13 for near syncope after taking hydralazine early and jumping up quickly. Work up negative. ECHO EF improved to 40-45%, Severe hypokinesis base inferolateral. Akinesis base/mid inferior wall. 30 day Event monitor placed 03/11/13. This showed no significant arrhythmias.  Echo 3/18: EF 30-35% large aneurysm at the base to mid inferior wall.  She returns for follow up with her grandson. Feels good. Denies CP or SOB. No edema, orthopnea or PND. Goes to store without problem. Still smoking a few cigarettes per day. Taking all meds as prescribed.   Labs (8/14): K 4, creatinine 0.91 Labs (08/21/13): dig level 1.2 digoxin cut back to 0.0625 mg daily  K 4.6 Creatinine 0.89   ROS: All systems negative except as listed in HPI, PMH and Problem List.  Past Medical History:  Diagnosis Date  . Anemia   . Arthritis   . CAD (coronary artery disease) 09/11/2012  . CHF (congestive heart failure) ([place])   . Dental caries   . Essential hypertension 10/04/2012  . Ischemic cardiomyopathy   . NSTEMI (non-ST elevated myocardial infarction) ([place]) 09/11/2012  . Shortness of breath    Current Meds  Medication Sig  . atorvastatin (LIPITOR) 80 MG tablet TAKE 1 TABLET BY MOUTH DAILY AT 6PM  . carvedilol (COREG) 12.5 MG tablet TAKE 1 TABLET BY MOUTH 2 TIMES DAILY WITH A MEAL  . furosemide (LASIX) 40 MG tablet TAKE 1 TABLET BY MOUTH DAILY  . hydrALAZINE (APRESOLINE) 50 MG tablet TAKE 1 TABLET (50 MG TOTAL) BY MOUTH 3 (THREE) TIMES DAILY.  . isosorbide mononitrate (IMDUR) 30 MG 24 hr tablet TAKE 1 TABLET BY MOUTH ONCE DAILY  . losartan (COZAAR) 50 MG tablet Take 1 tablet (50 mg total) by mouth daily.  . ondansetron (ZOFRAN) 4 MG tablet Take 1 tablet (4 mg total) by mouth every 8 (eight) hours as needed for nausea or vomiting.  [name] oxyCODONE-acetaminophen (PERCOCET) 5-325 MG tablet Take 1-2 tablets by mouth every 4 (four) hours as needed.  . polyethylene glycol powder (GLYCOLAX/MIRALAX) powder Take 17 g by mouth daily. (Patient taking differently: Take 17 g by mouth daily as needed for moderate constipation. )  . warfarin (COUMADIN) 6 MG tablet TAKE AS DIRECTED  BY COUMADIN CLINIC    Allergies  Allergen Reactions  . Vicodin [Hydrocodone-Acetaminophen] Nausea And Vomiting      PHYSICAL EXAM: Vitals:   08/24/17 1113  BP: (!) 148/82  Pulse: 80  SpO2: 100%  Weight: 113 lb 6 oz (51.4 kg)    General:  Thin Well appearing. No resp difficulty HEENT: normal Neck: supple. no JVD. Carotids 2+ bilat; no bruits. No lymphadenopathy or thryomegaly appreciated. Cor: PMI nondisplaced. Regular rate & rhythm. No rubs, gallops or murmurs. Lungs: clear with mildly  decreased BS throughout Abdomen: soft, nontender, nondistended. No hepatosplenomegaly. No bruits or masses. Good bowel sounds. Extremities: no cyanosis, clubbing, rash, edema Neuro: alert & orientedx3, cranial nerves grossly intact. moves all 4 extremities w/o difficulty. Affect pleasant   ASSESSMENT & PLAN:  1) Chronic systolic HF: Ischemic cardiomyopathy. Echo reviewed with her and her grandson today. Stable EF ~35%  She has a large inferior wall aneurysm.   - Remains stable. NYHA II-early II symptoms.  --Volume status looks good. - Continue lasix 40mg  daily. Discussed the fact that she can cut back to PRN with addition of Entresto as needed - Continue carvedilol 12.5 mg . HR too low to go further - Change losartan to Entresto 24/26 bid - Continue hydralazine to 50 mg tid and continue imdur 30 mg daily.  - We discussed the fact that EF is borderline range for ICD. I suggested MRI to further evaluate but she declined due to claustrophobia. We will repeat echo to reassess EF and need for ICD 2) CAD:  -- No s/s of ischemia --Not on ASA with warfarin use.  --Continue atorvastatin and b-blocker 3) LV aneurysm:   - Felt to be high-risk for embolic phenomenon -> continue coumadin.  - Has tolerated well without bleeding. Continue to follow with coumadin clinic 4) HTN - Blood pressure elevated. Switching losartan to Entresto. Will titrate Entresto as tolerated. 5) Smoking - Again. I encouraged her to stop smoking completely.   [name] MD 08/24/2017

## 2017-08-29 ENCOUNTER — Telehealth (HOSPITAL_COMMUNITY): Payer: Self-pay | Admitting: Pharmacist

## 2017-08-29 NOTE — Telephone Encounter (Signed)
Entresto 24-26 mg BID PA approved by [facility] Part D through 08/29/19.   Ruta Hinds. Velva Harman, PharmD, BCPS, CPP Clinical Pharmacist Pager: [phone] Phone: [phone] 08/29/2017 4:18 PM

## 2017-08-31 ENCOUNTER — Ambulatory Visit (INDEPENDENT_AMBULATORY_CARE_PROVIDER_SITE_OTHER): Payer: Medicare Other | Admitting: *Deleted

## 2017-08-31 DIAGNOSIS — I253 Aneurysm of heart: Secondary | ICD-10-CM

## 2017-08-31 DIAGNOSIS — Z7901 Long term (current) use of anticoagulants: Secondary | ICD-10-CM

## 2017-08-31 LAB — POCT INR: INR: 2.4

## 2017-08-31 NOTE — Patient Instructions (Signed)
Description   Continue taking same dosage of coumadin 1 tablet every day except 1/2 tablet on Sundays and Thursdays. Recheck in 3 weeks. Call with any questions of concerns [phone]

## 2017-09-05 ENCOUNTER — Other Ambulatory Visit (HOSPITAL_COMMUNITY): Payer: Self-pay | Admitting: Adult Health

## 2017-09-07 MED FILL — ISOSORBIDE MN ER 30 MG TAB: 30 | 30 days supply | Qty: 30 | Fill #0

## 2017-09-21 ENCOUNTER — Ambulatory Visit (INDEPENDENT_AMBULATORY_CARE_PROVIDER_SITE_OTHER): Payer: Medicare Other | Admitting: *Deleted

## 2017-09-21 DIAGNOSIS — Z7901 Long term (current) use of anticoagulants: Secondary | ICD-10-CM | POA: Diagnosis not present

## 2017-09-21 DIAGNOSIS — I253 Aneurysm of heart: Secondary | ICD-10-CM | POA: Diagnosis not present

## 2017-09-21 LAB — POCT INR: INR: 2.5

## 2017-09-21 NOTE — Patient Instructions (Signed)
Description   Continue taking same dosage of coumadin 1 tablet every day except 1/2 tablet on Sundays and Thursdays. Recheck in 4 weeks. Call with any questions of concerns [phone]

## 2017-10-16 MED FILL — ISOSORBIDE MN ER 30 MG TAB: 30 | 30 days supply | Qty: 30 | Fill #1

## 2017-10-19 ENCOUNTER — Other Ambulatory Visit (HOSPITAL_COMMUNITY): Payer: Self-pay | Admitting: Adult Health

## 2017-10-19 ENCOUNTER — Ambulatory Visit (INDEPENDENT_AMBULATORY_CARE_PROVIDER_SITE_OTHER): Payer: Medicare Other | Admitting: *Deleted

## 2017-10-19 DIAGNOSIS — I253 Aneurysm of heart: Secondary | ICD-10-CM

## 2017-10-19 DIAGNOSIS — Z7901 Long term (current) use of anticoagulants: Secondary | ICD-10-CM

## 2017-10-19 LAB — POCT INR: INR: 2

## 2017-10-19 NOTE — Patient Instructions (Signed)
Description   Today take 1.5 tablets, then Continue taking same dosage of coumadin 1 tablet every day except 1/2 tablet on Sundays and Thursdays. Recheck in 4 weeks. Call with any questions of concerns [phone]

## 2017-10-22 MED FILL — CARVEDILOL 12.5 MG TABLET: 12.5 | 30 days supply | Qty: 60 | Fill #0

## 2017-10-31 ENCOUNTER — Other Ambulatory Visit: Payer: Self-pay | Admitting: Internal Medicine

## 2017-10-31 DIAGNOSIS — I253 Aneurysm of heart: Secondary | ICD-10-CM

## 2017-10-31 MED FILL — ATORVASTATIN 80 MG TABLET: 80 | 30 days supply | Qty: 30 | Fill #0

## 2017-10-31 MED FILL — WARFARIN SODIUM 6 MG TABLET: 6 | 30 days supply | Qty: 40 | Fill #0

## 2017-10-31 MED FILL — hydrALAZINE HCL 50 MG TABS: 50 | 30 days supply | Qty: 90 | Fill #3

## 2017-11-21 MED FILL — ISOSORBIDE MN ER 30 MG TAB: 30 | 30 days supply | Qty: 30 | Fill #2

## 2017-11-23 ENCOUNTER — Ambulatory Visit (INDEPENDENT_AMBULATORY_CARE_PROVIDER_SITE_OTHER): Payer: Medicare Other | Admitting: *Deleted

## 2017-11-23 DIAGNOSIS — Z7901 Long term (current) use of anticoagulants: Secondary | ICD-10-CM

## 2017-11-23 DIAGNOSIS — I253 Aneurysm of heart: Secondary | ICD-10-CM

## 2017-11-23 LAB — POCT INR: INR: 3.1

## 2017-11-23 NOTE — Patient Instructions (Addendum)
Description   Today take 1/2 tablet then continue taking same dosage of coumadin 1 tablet every day except 1/2 tablet on Sundays and Thursdays. Recheck in 4 weeks. Call with any questions of concerns [phone]

## 2017-11-30 ENCOUNTER — Ambulatory Visit (HOSPITAL_COMMUNITY): Admission: RE | Admit: 2017-11-30 | Payer: Medicare Other | Source: Ambulatory Visit

## 2017-11-30 ENCOUNTER — Encounter (HOSPITAL_COMMUNITY): Payer: Medicare Other | Admitting: Internal Medicine

## 2017-12-20 MED FILL — ISOSORBIDE MN ER 30 MG TAB: 30 | 30 days supply | Qty: 30 | Fill #3

## 2017-12-20 MED FILL — CARVEDILOL 12.5 MG TABLET: 12.5 | 30 days supply | Qty: 60 | Fill #1

## 2017-12-20 MED FILL — WARFARIN SODIUM 6 MG TABLET: 6 | 30 days supply | Qty: 40 | Fill #1

## 2017-12-21 ENCOUNTER — Ambulatory Visit (INDEPENDENT_AMBULATORY_CARE_PROVIDER_SITE_OTHER): Payer: Medicare Other | Admitting: Pharmacist

## 2017-12-21 DIAGNOSIS — Z7901 Long term (current) use of anticoagulants: Secondary | ICD-10-CM | POA: Diagnosis not present

## 2017-12-21 DIAGNOSIS — I253 Aneurysm of heart: Secondary | ICD-10-CM

## 2017-12-21 LAB — POCT INR: INR: 1.8

## 2017-12-21 NOTE — Patient Instructions (Signed)
Description   Today take 1.5 tablets then continue taking same dosage of coumadin 1 tablet every day except 1/2 tablet on Sundays and Thursdays. Recheck in 3 weeks. Call with any questions of concerns [phone]

## 2018-01-11 ENCOUNTER — Ambulatory Visit (INDEPENDENT_AMBULATORY_CARE_PROVIDER_SITE_OTHER): Payer: Medicare Other | Admitting: *Deleted

## 2018-01-11 DIAGNOSIS — Z7901 Long term (current) use of anticoagulants: Secondary | ICD-10-CM | POA: Diagnosis not present

## 2018-01-11 DIAGNOSIS — I253 Aneurysm of heart: Secondary | ICD-10-CM

## 2018-01-11 DIAGNOSIS — Z5181 Encounter for therapeutic drug level monitoring: Secondary | ICD-10-CM

## 2018-01-11 LAB — POCT INR: INR: 1.7

## 2018-01-11 NOTE — Patient Instructions (Signed)
Description   Today April 26th  take 1.5 tablets then change coumadin dose to  1 tablet every day except 1/2 tablet only on  Sundays . Recheck in 2 weeks. Call with any questions of concerns [phone]

## 2018-01-17 ENCOUNTER — Other Ambulatory Visit (HOSPITAL_COMMUNITY): Payer: Self-pay | Admitting: Internal Medicine

## 2018-01-17 MED FILL — ATORVASTATIN 80 MG TABLET: 80 | 30 days supply | Qty: 30 | Fill #1

## 2018-01-17 MED FILL — ISOSORBIDE MN ER 30 MG TAB: 30 | 30 days supply | Qty: 30 | Fill #4

## 2018-01-18 MED FILL — hydrALAZINE HCL 50 MG TABS: 50 | 30 days supply | Qty: 90 | Fill #0

## 2018-01-25 ENCOUNTER — Ambulatory Visit (INDEPENDENT_AMBULATORY_CARE_PROVIDER_SITE_OTHER): Payer: Medicare Other | Admitting: Pharmacist

## 2018-01-25 DIAGNOSIS — I253 Aneurysm of heart: Secondary | ICD-10-CM

## 2018-01-25 DIAGNOSIS — Z7901 Long term (current) use of anticoagulants: Secondary | ICD-10-CM | POA: Diagnosis not present

## 2018-01-25 LAB — POCT INR: INR: 2.2

## 2018-01-25 NOTE — Patient Instructions (Signed)
Description   Continue coumadin dose to 1 tablet every day except 1/2 tablet only on  Sundays . Recheck in 3 weeks. Call with any questions of concerns [phone]

## 2018-01-31 DIAGNOSIS — I509 Heart failure, unspecified: Secondary | ICD-10-CM | POA: Diagnosis not present

## 2018-01-31 DIAGNOSIS — B351 Tinea unguium: Secondary | ICD-10-CM | POA: Diagnosis not present

## 2018-01-31 DIAGNOSIS — L600 Ingrowing nail: Secondary | ICD-10-CM | POA: Diagnosis not present

## 2018-02-01 ENCOUNTER — Ambulatory Visit (HOSPITAL_COMMUNITY)
Admission: RE | Admit: 2018-02-01 | Discharge: 2018-02-01 | Disposition: A | Discharge status: Home / Self Care | Payer: Medicare Other | Source: Ambulatory Visit | Attending: Internal Medicine | Admitting: Internal Medicine

## 2018-02-01 ENCOUNTER — Ambulatory Visit (HOSPITAL_BASED_OUTPATIENT_CLINIC_OR_DEPARTMENT_OTHER)
Admission: RE | Admit: 2018-02-01 | Discharge: 2018-02-01 | Disposition: A | Discharge status: Home / Self Care | Payer: Medicare Other | Source: Ambulatory Visit | Attending: Internal Medicine | Admitting: Internal Medicine

## 2018-02-01 ENCOUNTER — Encounter (HOSPITAL_COMMUNITY): Payer: Self-pay | Admitting: Internal Medicine

## 2018-02-01 VITALS — BP 162/84 | HR 74 | Wt 113.0 lb

## 2018-02-01 DIAGNOSIS — R29898 Other symptoms and signs involving the musculoskeletal system: Secondary | ICD-10-CM | POA: Diagnosis not present

## 2018-02-01 DIAGNOSIS — Z87891 Personal history of nicotine dependence: Secondary | ICD-10-CM | POA: Insufficient documentation

## 2018-02-01 DIAGNOSIS — I253 Aneurysm of heart: Secondary | ICD-10-CM | POA: Diagnosis not present

## 2018-02-01 DIAGNOSIS — I5022 Chronic systolic (congestive) heart failure: Secondary | ICD-10-CM | POA: Insufficient documentation

## 2018-02-01 DIAGNOSIS — I110 Hypertensive heart disease with heart failure: Secondary | ICD-10-CM | POA: Diagnosis not present

## 2018-02-01 DIAGNOSIS — I2510 Atherosclerotic heart disease of native coronary artery without angina pectoris: Secondary | ICD-10-CM | POA: Diagnosis not present

## 2018-02-01 DIAGNOSIS — I340 Nonrheumatic mitral (valve) insufficiency: Secondary | ICD-10-CM | POA: Diagnosis not present

## 2018-02-01 DIAGNOSIS — I252 Old myocardial infarction: Secondary | ICD-10-CM | POA: Diagnosis not present

## 2018-02-01 LAB — BASIC METABOLIC PANEL
Anion gap: 8 (ref 5–15)
BUN: 9 mg/dL (ref 6–20)
CHLORIDE: 107 mmol/L (ref 101–111)
CO2: 25 mmol/L (ref 22–32)
Calcium: 9.2 mg/dL (ref 8.9–10.3)
Creatinine, Ser: 1.52 mg/dL — ABNORMAL HIGH (ref 0.44–1.00)
GFR calc Af Amer: 37 mL/min — ABNORMAL LOW (ref >60)
GFR, EST NON AFRICAN AMERICAN: 32 mL/min — AB (ref >60)
GLUCOSE: 101 mg/dL — AB (ref 65–99)
POTASSIUM: 3.7 mmol/L (ref 3.5–5.1)
Sodium: 140 mmol/L (ref 135–145)

## 2018-02-01 LAB — BRAIN NATRIURETIC PEPTIDE: B NATRIURETIC PEPTIDE 5: 346.1 pg/mL — AB (ref 0.0–100.0)

## 2018-02-01 MED ORDER — HYDRALAZINE HCL 50 MG PO TABS: 75.0000 mg | ORAL_TABLET | Freq: Three times a day (TID) | ORAL | 6 refills | Status: DC

## 2018-02-01 MED ORDER — ISOSORBIDE MONONITRATE ER 60 MG PO TB24: 60.0000 mg | ORAL_TABLET | Freq: Every day | ORAL | 3 refills | Status: DC

## 2018-02-01 NOTE — Patient Instructions (Signed)
Labs today (will call for abnormal results, otherwise no news is good news)  INCREASE Hydralazine to 75 mg (1.5 Tablets) Three Times daily  INCREASE Imdur to 60 mg Once Daily  Follow up in 4 months

## 2018-02-01 NOTE — Progress Notes (Signed)
ADVANCED HEART FAILURE CLINIC NOTE  Patient ID: Faith Gray, female   DOB: 1940/11/05, 77 y.o.   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    Admitted to [facility] 12/13 for new onset acute systolic heart failure. Echo showed EF 25-30% with diffuse hypokinesis. Mild MR.   Cath 12/13:  Left main: Normal  LAD: Gives off 3 diagonals. Mild diffuse plaquing with 30-40% lesion in midsection. Otherwise normal.  LCX: Tiny ramus. Large OM-1 & OM-2. 30% lesion in ostial LCX. Otherwise mild plaque.  RCA: Dominant vessel with diffuse disease. Diffuse 30-40% through midsection. In distal RCA there is a ruptured plaque with about 60% stenosis. PDA moderate diffuse disease. In distal RCA just prior to take-off of PLs there is a very focal area of myocardial bridging with severe systolic compression.  LV-gram done in the RAO projection: Ejection fraction = 25-30%. There is a large aneurysm at the base to mid inferior wall. Anterior wall mild HK. No significant MR noted.  Admitted to [facility] 02/24/13-02/25/13 for near syncope after taking hydralazine early and jumping up quickly. Work up negative. ECHO EF improved to 40-45%, Severe hypokinesis base inferolateral. Akinesis base/mid inferior wall. 30 day Event monitor placed 03/11/13. This showed no significant arrhythmias.  Echo 3/18: EF 30-35% large aneurysm at the base to mid inferior wall.  She returns for follow up with her grandson. Doing quite well. Denies SOB, orthopnea or PND. Taking BP at home SBP typically 150s. No edema. .  Echo today: EF 35% large aneurysm at the base to mid inferior wall.  Labs (8/14): K 4, creatinine 0.91 Labs (08/21/13): dig level 1.2 digoxin cut back to 0.0625  mg daily K 4.6 Creatinine 0.89   ROS: All systems negative except as listed in HPI, PMH and Problem List.  Past Medical History:  Diagnosis Date  . Anemia   . Arthritis   . CAD (coronary artery disease) 09/11/2012  . CHF (congestive heart failure) ([place])   . Dental caries   . Essential hypertension 10/04/2012  . Ischemic cardiomyopathy   . NSTEMI (non-ST elevated myocardial infarction) ([place]) 09/11/2012  . Shortness of breath    Current Meds  Medication Sig  . atorvastatin (LIPITOR) 80 MG tablet TAKE 1 TABLET BY MOUTH DAILY AT 6PM  . carvedilol (COREG) 12.5 MG tablet TAKE 1 TABLET BY MOUTH TWICE DAILY WITH MEALS  . furosemide (LASIX) 40 MG tablet TAKE 1 TABLET BY MOUTH DAILY  . hydrALAZINE (APRESOLINE) 50 MG tablet TAKE 1 TABLET (50 MG TOTAL) BY MOUTH 3 (THREE) TIMES DAILY.  . isosorbide mononitrate (IMDUR) 30 MG 24 hr tablet TAKE 1 TABLET BY MOUTH ONCE DAILY  . ondansetron (ZOFRAN) 4 MG tablet Take 1 tablet (4 mg total) by mouth every 8 (eight) hours as needed for nausea or vomiting.  [name] oxyCODONE-acetaminophen (PERCOCET) 5-325 MG tablet Take 1-2 tablets by mouth every 4 (four) hours as needed.  . polyethylene glycol powder (GLYCOLAX/MIRALAX) powder Take 17 g by mouth daily. (Patient taking differently: Take 17 g by mouth daily as needed for moderate constipation. )  . sacubitril-valsartan (ENTRESTO) 24-26 MG Take 1 tablet by mouth 2 (two) times daily.  [name] warfarin (COUMADIN) 6 MG tablet TAKE AS DIRECTED  BY COUMADIN CLINIC    Allergies  Allergen Reactions  . Vicodin [Hydrocodone-Acetaminophen] Nausea And Vomiting      PHYSICAL EXAM: Vitals:   02/01/18 1450  BP: (!) 162/84  Pulse: 74  SpO2: 98%  Weight: 113 lb (51.3 kg)    General:  Thin No resp difficulty HEENT: normal Neck: supple. no JVD. Carotids 2+ bilat; no bruits. No lymphadenopathy or thryomegaly appreciated. Cor: PMI nondisplaced. Regular rate & rhythm. No rubs, gallops or murmurs. Lungs: clear with decreased BS  no wheeze  Abdomen: soft, nontender, nondistended. No hepatosplenomegaly. No bruits or masses. Good bowel sounds. Extremities: no cyanosis, clubbing, rash, edema Neuro: alert & orientedx3, cranial nerves grossly intact. moves all 4 extremities w/o difficulty. Affect pleasant   ASSESSMENT & PLAN:  1) Chronic systolic HF: Ischemic cardiomyopathy. Echo reviewed with her and her grandson today. Stable EF ~35%  She has a large inferior wall aneurysm.   - Remains stable. NYHA II.  - Volume status looks good - Continue carvedilol 12.5 mg  - Continue Entresto 24/26 bid - Increase hydralazine to 75 mg tid and imdur 60 mg daily.  - We again discussed the fact that EF is borderline range for ICD. I suggested MRI to further evaluate but she declined due to claustrophobia. Currently not wanting to pursue ICD 2) CAD:  - No s/s of ischemia - Not on ASA with warfarin use.  - Continue atorvastatin and b-blocker 3) LV aneurysm:   - Felt to be high-risk for embolic phenomenon -> continue coumadin.  - Has tolerated well without bleeding. Continue to follow with coumadin clinic 4) HTN - Blood pressure elevated. Increase hydralazine and Imdur 5) Smoking - Again,  I encouraged her to stop smoking completely  [name] MD 02/01/2018

## 2018-02-01 NOTE — Progress Notes (Signed)
  Echocardiogram 2D Echocardiogram has been performed.  Jennette Dubin 02/01/2018, 2:49 PM

## 2018-02-05 ENCOUNTER — Telehealth (HOSPITAL_COMMUNITY): Payer: Self-pay | Admitting: *Deleted

## 2018-02-05 DIAGNOSIS — I5022 Chronic systolic (congestive) heart failure: Secondary | ICD-10-CM

## 2018-02-05 MED ORDER — FUROSEMIDE 40 MG PO TABS: 40.0000 mg | ORAL_TABLET | ORAL | 3 refills | Status: DC | PRN

## 2018-02-05 NOTE — Telephone Encounter (Signed)
Notes recorded by Scarlette Calico, RN on 02/05/2018 at 9:38 AM EDT Pt aware, she states she has decreased Lasix to PRN, she is going to [facility] on Fri 5/31, will have work done there, order placed

## 2018-02-05 NOTE — Telephone Encounter (Signed)
-----   Message from Jolaine Artist, MD sent at 02/01/2018  6:55 PM EDT ----- Creatinine up. I changed lasix to prn in clinic. Please recheck 1 week.

## 2018-02-12 DIAGNOSIS — M79675 Pain in left toe(s): Secondary | ICD-10-CM | POA: Diagnosis not present

## 2018-02-12 DIAGNOSIS — L600 Ingrowing nail: Secondary | ICD-10-CM | POA: Diagnosis not present

## 2018-02-12 DIAGNOSIS — M25775 Osteophyte, left foot: Secondary | ICD-10-CM | POA: Diagnosis not present

## 2018-02-12 DIAGNOSIS — M25774 Osteophyte, right foot: Secondary | ICD-10-CM | POA: Diagnosis not present

## 2018-02-12 DIAGNOSIS — M79674 Pain in right toe(s): Secondary | ICD-10-CM | POA: Diagnosis not present

## 2018-02-15 ENCOUNTER — Ambulatory Visit (INDEPENDENT_AMBULATORY_CARE_PROVIDER_SITE_OTHER): Payer: Medicare Other | Admitting: *Deleted

## 2018-02-15 ENCOUNTER — Other Ambulatory Visit: Payer: Medicare Other

## 2018-02-15 DIAGNOSIS — I253 Aneurysm of heart: Secondary | ICD-10-CM

## 2018-02-15 DIAGNOSIS — I5022 Chronic systolic (congestive) heart failure: Secondary | ICD-10-CM | POA: Diagnosis not present

## 2018-02-15 DIAGNOSIS — Z7901 Long term (current) use of anticoagulants: Secondary | ICD-10-CM

## 2018-02-15 DIAGNOSIS — Z5181 Encounter for therapeutic drug level monitoring: Secondary | ICD-10-CM | POA: Diagnosis not present

## 2018-02-15 LAB — POCT INR: INR: 1.7 — AB (ref 2.0–3.0)

## 2018-02-15 NOTE — Patient Instructions (Signed)
Description   Today May 31st  take 1 and 1/2 tablets then continue same coumadin dose  1 tablet every day except 1/2 tablet only on  Sundays . Recheck in 2 weeks. Call with any questions of concerns [phone]

## 2018-02-16 LAB — BASIC METABOLIC PANEL
BUN/Creatinine Ratio: 6 — ABNORMAL LOW (ref 12–28)
BUN: 7 mg/dL — AB (ref 8–27)
CHLORIDE: 104 mmol/L (ref 96–106)
CO2: 23 mmol/L (ref 20–29)
CREATININE: 1.09 mg/dL — AB (ref 0.57–1.00)
Calcium: 9.3 mg/dL (ref 8.7–10.3)
GFR, EST AFRICAN AMERICAN: 57 mL/min/{1.73_m2} — AB (ref >59)
GFR, EST NON AFRICAN AMERICAN: 49 mL/min/{1.73_m2} — AB (ref >59)
Glucose: 95 mg/dL (ref 65–99)
Potassium: 4.3 mmol/L (ref 3.5–5.2)
Sodium: 140 mmol/L (ref 134–144)

## 2018-02-20 MED FILL — WARFARIN SODIUM 6 MG TABLET: 6 | 30 days supply | Qty: 40 | Fill #2

## 2018-02-20 MED FILL — CARVEDILOL 12.5 MG TABLET: 12.5 | 30 days supply | Qty: 60 | Fill #2

## 2018-02-28 ENCOUNTER — Ambulatory Visit (INDEPENDENT_AMBULATORY_CARE_PROVIDER_SITE_OTHER): Payer: Medicare Other | Admitting: *Deleted

## 2018-02-28 DIAGNOSIS — Z7901 Long term (current) use of anticoagulants: Secondary | ICD-10-CM

## 2018-02-28 DIAGNOSIS — I253 Aneurysm of heart: Secondary | ICD-10-CM

## 2018-02-28 LAB — POCT INR: INR: 1.4 — AB (ref 2.0–3.0)

## 2018-02-28 NOTE — Patient Instructions (Addendum)
Description   Today and tomorrow take 1.5 tablets then start taking 1 tablet every day.  Recheck in 1 week.  Call with any questions of concerns [phone]

## 2018-03-05 DIAGNOSIS — L97512 Non-pressure chronic ulcer of other part of right foot with fat layer exposed: Secondary | ICD-10-CM | POA: Diagnosis not present

## 2018-03-12 DIAGNOSIS — L97511 Non-pressure chronic ulcer of other part of right foot limited to breakdown of skin: Secondary | ICD-10-CM | POA: Diagnosis not present

## 2018-03-12 DIAGNOSIS — L97521 Non-pressure chronic ulcer of other part of left foot limited to breakdown of skin: Secondary | ICD-10-CM | POA: Diagnosis not present

## 2018-03-14 ENCOUNTER — Ambulatory Visit (INDEPENDENT_AMBULATORY_CARE_PROVIDER_SITE_OTHER): Payer: Medicare Other | Admitting: *Deleted

## 2018-03-14 DIAGNOSIS — Z7901 Long term (current) use of anticoagulants: Secondary | ICD-10-CM

## 2018-03-14 DIAGNOSIS — I253 Aneurysm of heart: Secondary | ICD-10-CM

## 2018-03-14 LAB — POCT INR: INR: 4.2 — AB (ref 2.0–3.0)

## 2018-03-14 NOTE — Patient Instructions (Signed)
Description   Skip today's dose, tomorrow take 1/2 tablet,  then continue same coumadin dose 1 tablet every day except 1/2 tablet only on  Sundays . Recheck in 2 weeks. Call with any questions of concerns [phone]

## 2018-04-09 MED FILL — WARFARIN SODIUM 6 MG TABLET: 6 | 30 days supply | Qty: 40 | Fill #3

## 2018-04-10 ENCOUNTER — Ambulatory Visit (INDEPENDENT_AMBULATORY_CARE_PROVIDER_SITE_OTHER): Payer: Medicare Other | Admitting: *Deleted

## 2018-04-10 DIAGNOSIS — I253 Aneurysm of heart: Secondary | ICD-10-CM

## 2018-04-10 DIAGNOSIS — Z7901 Long term (current) use of anticoagulants: Secondary | ICD-10-CM

## 2018-04-10 LAB — POCT INR: INR: 2 (ref 2.0–3.0)

## 2018-04-10 NOTE — Patient Instructions (Signed)
Description   Continue same coumadin dose 1 tablet every day except 1/2 tablet only on  Sundays . Recheck in 3 weeks. Call with any questions of concerns [phone]

## 2018-04-24 ENCOUNTER — Other Ambulatory Visit (HOSPITAL_COMMUNITY): Payer: Self-pay | Admitting: Internal Medicine

## 2018-04-25 MED FILL — ATORVASTATIN 80 MG TABLET: 80 | 30 days supply | Qty: 30 | Fill #2

## 2018-04-25 MED FILL — CARVEDILOL 12.5 MG TABLET: 12.5 | 30 days supply | Qty: 60 | Fill #3

## 2018-05-09 ENCOUNTER — Other Ambulatory Visit: Payer: Self-pay

## 2018-05-09 ENCOUNTER — Encounter (INDEPENDENT_AMBULATORY_CARE_PROVIDER_SITE_OTHER): Payer: Self-pay

## 2018-05-09 ENCOUNTER — Ambulatory Visit (HOSPITAL_COMMUNITY)
Admission: EM | Admit: 2018-05-09 | Discharge: 2018-05-09 | Disposition: A | Discharge status: Home / Self Care | Payer: Medicare Other | Attending: Family Medicine | Admitting: Family Medicine

## 2018-05-09 ENCOUNTER — Ambulatory Visit (INDEPENDENT_AMBULATORY_CARE_PROVIDER_SITE_OTHER): Payer: Medicare Other

## 2018-05-09 ENCOUNTER — Encounter (HOSPITAL_COMMUNITY): Payer: Self-pay

## 2018-05-09 DIAGNOSIS — Z7901 Long term (current) use of anticoagulants: Secondary | ICD-10-CM | POA: Diagnosis not present

## 2018-05-09 DIAGNOSIS — S300XXA Contusion of lower back and pelvis, initial encounter: Secondary | ICD-10-CM | POA: Diagnosis not present

## 2018-05-09 DIAGNOSIS — I253 Aneurysm of heart: Secondary | ICD-10-CM | POA: Diagnosis not present

## 2018-05-09 DIAGNOSIS — M533 Sacrococcygeal disorders, not elsewhere classified: Secondary | ICD-10-CM

## 2018-05-09 LAB — POCT INR: INR: 2.6 (ref 2.0–3.0)

## 2018-05-09 NOTE — ED Triage Notes (Signed)
Pt states her buttock and painful x 2 days

## 2018-05-09 NOTE — ED Provider Notes (Signed)
[facility]    CSN: [id number] Arrival date & time: 05/09/18  1531     History   Chief Complaint Chief Complaint  Patient presents with  . buttock pain    HPI Faith Gray is a 77 y.o. female.   Pt is a 77 year old female that presents with tenderness and swelling to her coccyx x 2 days. She denies any injury, falls. She reports that she was laying in the bed and it felt swollen. No bruising,erythema fever, chills. The symptoms have been constant and remained the same. Pushing on the coccyx or lying on it makes it worse. Denies any constipation, weakness, numbness or tingling. She has not taken anything for her symptoms.   Former smoker. Hx of arthritis. Family hx of sickle cell    ROS per HPI       Past Medical History:  Diagnosis Date  . Anemia   . Arthritis   . CAD (coronary artery disease) 09/11/2012  . CHF (congestive heart failure) ([place])   . Dental caries   . Essential hypertension 10/04/2012  . Ischemic cardiomyopathy   . NSTEMI (non-ST elevated myocardial infarction) ([place]) 09/11/2012  . Shortness of breath     Patient Active Problem List   Diagnosis Date Noted  . Syncope 02/24/2013  . Essential hypertension 10/04/2012  . Chronic systolic heart failure ([place]) 09/17/2012  . Aneurysm of left ventricle of heart 09/16/2012  . Long term (current) use of anticoagulants 09/16/2012  . CAD (coronary artery disease) 09/11/2012  . NSTEMI (non-ST elevated myocardial infarction) ([place]) 09/11/2012  . Acute systolic heart failure ([place]) 09/09/2012  . Chest tightness 09/09/2012  . CHF exacerbation ([place]) 09/07/2012  . SOB (shortness of breath) 09/07/2012  . Chest pain 09/07/2012  . EKG abnormalities 09/07/2012  . Normochromic anemia 09/07/2012    Past Surgical History:  Procedure Laterality Date  . CARDIAC CATHETERIZATION    . LEFT AND RIGHT HEART CATHETERIZATION WITH CORONARY ANGIOGRAM N/A 09/10/2012   Procedure: LEFT AND RIGHT HEART CATHETERIZATION  WITH CORONARY ANGIOGRAM;  Surgeon: Jolaine Artist, MD;  Location: [facility] CATH LAB;  Service: Cardiovascular;  Laterality: N/A;  . MULTIPLE EXTRACTIONS WITH ALVEOLOPLASTY N/A 02/16/2017   Procedure: MULTIPLE EXTRACTION WITH ALVEOLOPLASTY;  Surgeon: Diona Browner, DDS;  Location: [facility];  Service: Oral Surgery;  Laterality: N/A;    OB History   None      Home Medications    Prior to Admission medications   Medication Sig Start Date End Date Taking? Authorizing Provider  atorvastatin (LIPITOR) 80 MG tablet TAKE 1 TABLET BY MOUTH DAILY AT 6PM 10/31/17   Bensimhon, Shaune Pascal, MD  carvedilol (COREG) 12.5 MG tablet TAKE 1 TABLET BY MOUTH TWICE DAILY WITH MEALS 10/22/17   Bensimhon, Shaune Pascal, MD  ENTRESTO 24-26 MG TAKE 1 TABLET BY MOUTH TWICE A DAY 04/24/18   Bensimhon, Shaune Pascal, MD  furosemide (LASIX) 40 MG tablet Take 1 tablet (40 mg total) by mouth as needed. 02/05/18   Bensimhon, Shaune Pascal, MD  hydrALAZINE (APRESOLINE) 50 MG tablet Take 1.5 tablets (75 mg total) by mouth 3 (three) times daily. 02/01/18   Bensimhon, Shaune Pascal, MD  isosorbide mononitrate (IMDUR) 60 MG 24 hr tablet Take 1 tablet (60 mg total) by mouth daily. 02/01/18   Bensimhon, Shaune Pascal, MD  ondansetron (ZOFRAN) 4 MG tablet Take 1 tablet (4 mg total) by mouth every 8 (eight) hours as needed for nausea or vomiting. 02/16/17   Diona Browner, DDS  oxyCODONE-acetaminophen ([place]) [phone]  MG tablet Take 1-2 tablets by mouth every 4 (four) hours as needed. 02/16/17   Diona Browner, DDS  polyethylene glycol powder (GLYCOLAX/MIRALAX) powder Take 17 g by mouth daily. Patient taking differently: Take 17 g by mouth daily as needed for moderate constipation.  10/22/13   Janne Napoleon, NP  warfarin (COUMADIN) 6 MG tablet TAKE AS DIRECTED BY COUMADIN CLINIC 10/31/17   Bensimhon, Shaune Pascal, MD    Family History Family History  Problem Relation Age of Onset  . Sickle cell anemia Brother     Social History Social History   Tobacco Use  . Smoking status:  Former Smoker    Packs/day: 0.50    Years: 20.00    Pack years: 10.00    Types: Cigarettes  . Smokeless tobacco: Never Used  Substance Use Topics  . Alcohol use: No  . Drug use: No     Allergies   Vicodin [hydrocodone-acetaminophen]   Review of Systems Review of Systems   Physical Exam Triage Vital Signs ED Triage Vitals  Enc Vitals Group     BP 05/09/18 1554 (!) 161/87     Pulse Rate 05/09/18 1554 60     Resp 05/09/18 1554 16     Temp 05/09/18 1554 97.6 F (36.4 C)     Temp Source 05/09/18 1554 Oral     SpO2 05/09/18 1554 100 %     Weight 05/09/18 1553 120 lb (54.4 kg)     Height --      Head Circumference --      Peak Flow --      Pain Score 05/09/18 1553 8     Pain Loc --      Pain Edu? --      Excl. in [place]? --    No data found.  Updated Vital Signs BP (!) 161/87   Pulse 60   Temp 97.6 F (36.4 C) (Oral)   Resp 16   Wt 120 lb (54.4 kg)   SpO2 100%   BMI 21.95 kg/m   Visual Acuity Right Eye Distance:   Left Eye Distance:   Bilateral Distance:    Right Eye Near:   Left Eye Near:    Bilateral Near:     Physical Exam  Constitutional: She is oriented to person, place, and time. She appears well-developed and well-nourished.  Very pleasant. Pt walking around in the room during history taking.   HENT:  Head: Normocephalic and atraumatic.  Eyes: Conjunctivae are normal.  Neck: Normal range of motion.  Pulmonary/Chest: Effort normal.  Musculoskeletal: Normal range of motion. She exhibits tenderness. She exhibits no edema or deformity.  Tenderness to palpation of coccyx. No obvious swelling or abnormalities felt. No erythema, bruising. Good ROM.   Neurological: She is alert and oriented to person, place, and time.  Skin: Skin is warm and dry.  Psychiatric: She has a normal mood and affect.  Nursing note and vitals reviewed.    UC Treatments / Results  Labs (all labs ordered are listed, but only abnormal results are displayed) Labs Reviewed -  No data to display  EKG None  Radiology Dg Sacrum/coccyx  Result Date: 05/09/2018 CLINICAL DATA:  Swelling over the sacrum and coccyx, no history of trauma EXAM: SACRUM AND COCCYX - 2+ VIEW COMPARISON:  None. FINDINGS: The sacrococcygeal elements are in normal alignment. No acute fracture is seen. The SI joints appear well corticated and the sacral foramina appear normal. The pelvic rami are intact. IMPRESSION: Negative. Electronically Signed  By: Ivar Drape M.D.   On: 05/09/2018 16:44    Procedures Procedures (including critical care time)  Medications Ordered in UC Medications - No data to display  Initial Impression / Assessment and Plan / UC Course  I have reviewed the triage vital signs and the nursing notes.  Pertinent labs & imaging results that were available during my care of the patient were reviewed by me and considered in my medical decision making (see chart for details).     X ray negative for fracture. Most likely contusion.  She can try heat/ice. NSAIDS Follow up as needed for continued or worsening symptoms  Final Clinical Impressions(s) / UC Diagnoses   Final diagnoses:  Contusion of coccyx, initial encounter     Discharge Instructions     Your x ray was normal.  You can try ice/heat to the area.  Follow up as needed for continued or worsening symptoms     ED Prescriptions    None     Controlled Substance Prescriptions [place] Controlled Substance Registry consulted? Not Applicable   Orvan July, NP 05/09/18 1711

## 2018-05-09 NOTE — Patient Instructions (Signed)
Description   Continue same coumadin dose 1 tablet every day except 1/2 tablet on Sundays.  Recheck in 4 weeks. Call with any questions of concerns [phone]

## 2018-05-09 NOTE — Discharge Instructions (Addendum)
Your x ray was normal.  You can try ice/heat to the area.  Follow up as needed for continued or worsening symptoms

## 2018-05-21 ENCOUNTER — Other Ambulatory Visit: Payer: Self-pay | Admitting: Internal Medicine

## 2018-05-21 DIAGNOSIS — I253 Aneurysm of heart: Secondary | ICD-10-CM

## 2018-05-21 MED FILL — WARFARIN SODIUM 6 MG TABLET: 6 | 30 days supply | Qty: 40 | Fill #0

## 2018-06-06 ENCOUNTER — Encounter (HOSPITAL_COMMUNITY): Payer: Medicare Other | Admitting: Internal Medicine

## 2018-06-24 ENCOUNTER — Ambulatory Visit (INDEPENDENT_AMBULATORY_CARE_PROVIDER_SITE_OTHER): Payer: Medicare Other

## 2018-06-24 DIAGNOSIS — Z7901 Long term (current) use of anticoagulants: Secondary | ICD-10-CM

## 2018-06-24 DIAGNOSIS — I253 Aneurysm of heart: Secondary | ICD-10-CM

## 2018-06-24 LAB — POCT INR: INR: 2 (ref 2.0–3.0)

## 2018-06-24 NOTE — Patient Instructions (Signed)
Description   Continue same coumadin dose 1 tablet every day except 1/2 tablet on Sundays.  Recheck in 5 weeks. Call with any questions of concerns [phone]

## 2018-07-10 MED FILL — CARVEDILOL 12.5 MG TABLET: 12.5 | 30 days supply | Qty: 60 | Fill #4

## 2018-07-10 MED FILL — ATORVASTATIN 80 MG TABLET: 80 | 30 days supply | Qty: 30 | Fill #3

## 2018-07-10 MED FILL — WARFARIN SODIUM 6 MG TABLET: 6 | 30 days supply | Qty: 40 | Fill #1

## 2018-07-17 ENCOUNTER — Other Ambulatory Visit (HOSPITAL_COMMUNITY): Payer: Self-pay

## 2018-07-17 MED ORDER — ATORVASTATIN CALCIUM 80 MG PO TABS: ORAL_TABLET | ORAL | 11 refills | Status: DC

## 2018-07-17 MED ORDER — CARVEDILOL 12.5 MG PO TABS: 12.5000 mg | ORAL_TABLET | Freq: Two times a day (BID) | ORAL | 11 refills | Status: DC

## 2018-07-17 MED ORDER — ISOSORBIDE MONONITRATE ER 60 MG PO TB24: 60.0000 mg | ORAL_TABLET | Freq: Every day | ORAL | 3 refills | Status: DC

## 2018-07-17 MED ORDER — HYDRALAZINE HCL 50 MG PO TABS: 75.0000 mg | ORAL_TABLET | Freq: Three times a day (TID) | ORAL | 6 refills | Status: DC

## 2018-07-24 ENCOUNTER — Other Ambulatory Visit (HOSPITAL_COMMUNITY): Payer: Self-pay

## 2018-07-24 DIAGNOSIS — I253 Aneurysm of heart: Secondary | ICD-10-CM

## 2018-07-24 MED ORDER — WARFARIN SODIUM 6 MG PO TABS: ORAL_TABLET | ORAL | 1 refills | Status: DC

## 2018-07-24 MED ORDER — SACUBITRIL-VALSARTAN 24-26 MG PO TABS: 1.0000 | ORAL_TABLET | Freq: Two times a day (BID) | ORAL | 3 refills | Status: DC

## 2018-07-31 ENCOUNTER — Other Ambulatory Visit: Payer: Self-pay

## 2018-07-31 ENCOUNTER — Ambulatory Visit (HOSPITAL_COMMUNITY)
Admission: RE | Admit: 2018-07-31 | Discharge: 2018-07-31 | Disposition: A | Discharge status: Home / Self Care | Payer: Medicare Other | Source: Ambulatory Visit | Attending: Internal Medicine | Admitting: Internal Medicine

## 2018-07-31 VITALS — BP 164/84 | HR 67 | Wt 113.4 lb

## 2018-07-31 DIAGNOSIS — Z79899 Other long term (current) drug therapy: Secondary | ICD-10-CM | POA: Insufficient documentation

## 2018-07-31 DIAGNOSIS — I5022 Chronic systolic (congestive) heart failure: Secondary | ICD-10-CM | POA: Diagnosis not present

## 2018-07-31 DIAGNOSIS — Z885 Allergy status to narcotic agent status: Secondary | ICD-10-CM | POA: Insufficient documentation

## 2018-07-31 DIAGNOSIS — I253 Aneurysm of heart: Secondary | ICD-10-CM | POA: Insufficient documentation

## 2018-07-31 DIAGNOSIS — Z87891 Personal history of nicotine dependence: Secondary | ICD-10-CM | POA: Insufficient documentation

## 2018-07-31 DIAGNOSIS — Z7901 Long term (current) use of anticoagulants: Secondary | ICD-10-CM | POA: Diagnosis not present

## 2018-07-31 DIAGNOSIS — Z720 Tobacco use: Secondary | ICD-10-CM | POA: Diagnosis not present

## 2018-07-31 DIAGNOSIS — I255 Ischemic cardiomyopathy: Secondary | ICD-10-CM | POA: Insufficient documentation

## 2018-07-31 DIAGNOSIS — I2510 Atherosclerotic heart disease of native coronary artery without angina pectoris: Secondary | ICD-10-CM | POA: Diagnosis not present

## 2018-07-31 DIAGNOSIS — I10 Essential (primary) hypertension: Secondary | ICD-10-CM | POA: Diagnosis not present

## 2018-07-31 DIAGNOSIS — M1990 Unspecified osteoarthritis, unspecified site: Secondary | ICD-10-CM | POA: Insufficient documentation

## 2018-07-31 DIAGNOSIS — I110 Hypertensive heart disease with heart failure: Secondary | ICD-10-CM | POA: Insufficient documentation

## 2018-07-31 DIAGNOSIS — I252 Old myocardial infarction: Secondary | ICD-10-CM | POA: Diagnosis not present

## 2018-07-31 MED ORDER — SACUBITRIL-VALSARTAN 49-51 MG PO TABS: 1.0000 | ORAL_TABLET | Freq: Two times a day (BID) | ORAL | 6 refills | Status: DC

## 2018-07-31 NOTE — Patient Instructions (Signed)
Labs need to be done in 10-14 days  INCREASE Entresto to 49/51 (1 tab) Twice daily  Your physician has requested that you have an echocardiogram. Echocardiography is a painless test that uses sound waves to create images of your heart. It provides your doctor with information about the size and shape of your heart and how well your heart's chambers and valves are working. This procedure takes approximately one hour. There are no restrictions for this procedure.   Follow up in 6 months with Dr. Haroldine Laws

## 2018-07-31 NOTE — Progress Notes (Signed)
ADVANCED HEART FAILURE CLINIC NOTE  Patient ID: Faith Gray, female   DOB: 07-07-1941, 77 y.o.   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    Admitted to [facility] 12/13 for new onset acute systolic heart failure. Echo showed EF 25-30% with diffuse hypokinesis. Mild MR.   Cath 12/13:  Left main: Normal  LAD: Gives off 3 diagonals. Mild diffuse plaquing with 30-40% lesion in midsection. Otherwise normal.  LCX: Tiny ramus. Large OM-1 & OM-2. 30% lesion in ostial LCX. Otherwise mild plaque.  RCA: Dominant vessel with diffuse disease. Diffuse 30-40% through midsection. In distal RCA there is a ruptured plaque with about 60% stenosis. PDA moderate diffuse disease. In distal RCA just prior to take-off of PLs there is a very focal area of myocardial bridging with severe systolic compression.  LV-gram done in the RAO projection: Ejection fraction = 25-30%. There is a large aneurysm at the base to mid inferior wall. Anterior wall mild HK. No significant MR noted.  Admitted to [facility] 02/24/13-02/25/13 for near syncope after taking hydralazine early and jumping up quickly. Work up negative. ECHO EF improved to 40-45%, Severe hypokinesis base inferolateral. Akinesis base/mid inferior wall. 30 day Event monitor placed 03/11/13. This showed no significant arrhythmias.  Echo 3/18: EF 30-35% large aneurysm at the base to mid inferior wall.  She presents today for regular follow up. Doing well overall. Didn't take either of her first two doses of hydralazine this am. States she usually doesn't forget. Denies SOB, orthopnea, or PND. No CP, lightheadedness or dizziness. Denies peripheral edema. She stopped smoking several months ago. Faith Gray and  does what she wants. Walked all around [place] and [place] without difficulty. Mild arthritic pain but otherwise stable. Hasn't needed any lasix.   Echo 02/01/18: EF 35% large aneurysm at the base to mid inferior wall.  Labs (8/14): K 4, creatinine 0.91 Labs (08/21/13): dig level 1.2 digoxin cut back to 0.0625 mg daily K 4.6 Creatinine 0.89  Review of systems complete and found to be negative unless listed in HPI.    Past Medical History:  Diagnosis Date  . Anemia   . Arthritis   . CAD (coronary artery disease) 09/11/2012  . CHF (congestive heart failure) ([place])   . Dental caries   . Essential hypertension 10/04/2012  . Ischemic cardiomyopathy   . NSTEMI (non-ST elevated myocardial infarction) ([place]) 09/11/2012  . Shortness of breath    Current Outpatient Medications  Medication Sig Dispense Refill  . atorvastatin (LIPITOR) 80 MG tablet TAKE 1 TABLET BY MOUTH DAILY AT 6PM 30 tablet 11  . carvedilol (COREG) 12.5 MG tablet Take 1 tablet (12.5 mg total) by mouth 2 (two) times daily with a meal. 60 tablet 11  . furosemide (LASIX) 40 MG tablet Take 1 tablet (40 mg total) by mouth as needed. 90 tablet 3  . hydrALAZINE (APRESOLINE) 50 MG tablet Take 1.5 tablets (75 mg total) by mouth 3 (three) times daily. 135 tablet 6  . isosorbide mononitrate (IMDUR) 60 MG 24 hr tablet Take 1 tablet (60 mg total) by mouth daily. 90 tablet 3  . ondansetron (ZOFRAN) 4 MG tablet Take 1 tablet (4 mg total) by mouth every 8 (eight) hours as  needed for nausea or vomiting. 12 tablet 0  . oxyCODONE-acetaminophen (PERCOCET) 5-325 MG tablet Take 1-2 tablets by mouth every 4 (four) hours as needed. 24 tablet 0  . polyethylene glycol powder (GLYCOLAX/MIRALAX) powder Take 17 g by mouth daily. (Patient taking differently: Take 17 g by mouth daily as needed for moderate constipation. ) 255 g 0  . sacubitril-valsartan (ENTRESTO) 24-26 MG Take 1 tablet by mouth 2 (two) times daily. 60 tablet 3  . warfarin (COUMADIN) 6 MG tablet Take 1  tab daily except 1/2 tab on Sunday or as directed by Coumadin Clinic. 100 tablet 1   No current facility-administered medications for this encounter.    Allergies  Allergen Reactions  . Vicodin [Hydrocodone-Acetaminophen] Nausea And Vomiting    PHYSICAL EXAM: Vitals:   07/31/18 1345  BP: (!) 164/84  Pulse: 67  SpO2: 98%  Weight: 51.4 kg (113 lb 6.4 oz)    Wt Readings from Last 3 Encounters:  07/31/18 51.4 kg (113 lb 6.4 oz)  05/09/18 54.4 kg (120 lb)  02/01/18 51.3 kg (113 lb)    General: Well appearing. No resp difficulty. HEENT: Normal Neck: Supple. JVP 5-6. Carotids 2+ bilat; no bruits. No thyromegaly or nodule noted. Cor: PMI nondisplaced. RRR, No M/G/R noted Lungs: CTAB, normal effort. Abdomen: Soft, non-tender, non-distended, no HSM. No bruits or masses. +BS  Extremities: No cyanosis, clubbing, or rash. R and LLE no edema.  Neuro: Alert & orientedx3, cranial nerves grossly intact. moves all 4 extremities w/o difficulty. Affect pleasant   ASSESSMENT & PLAN:  1) Chronic systolic HF: Ischemic cardiomyopathy. Echo reviewed with her and her grandson today. Stable EF ~35%  She has a large inferior wall aneurysm.   - NYHA I-II symptoms - Volume status stable on exam.   - Continue carvedilol 12.5 mg  - Increase Entresto 49/51 mg BID.  - Continue hydralazine 75 mg TID and imdur 60 mg daily.  - EF borderline for ICD consideration. She has refused consideration, or further quantification of EF with MRI for now.  2) CAD:  - No s/s of ischemia.    - Not on ASA with warfarin use.  - Continue atorvastatin and b-blocker 3) LV aneurysm:   - Felt to be high-risk for embolic phenomenon -> Continue coumadin. Dosing per coumadin clinic - Denies bleeding.  4) HTN - Meds as above.  5) Smoking - Encouraged complete cessation.   Meds as above. RTC 6 months. Sooner with symptoms.   Faith Friar, PA-C  07/31/2018   Patient seen and examined with the above-signed  Advanced Practice Provider and/or Housestaff. I personally reviewed laboratory data, imaging studies and relevant notes. I independently examined the patient and formulated the important aspects of the plan. I have edited the note to reflect any of my changes or salient points. I have personally discussed the plan with the patient and/or family.  She is much improved. NYHA II. Volume status looks good. No s/s of ischemia. Tolerating coumadin well. Congratulated her on smoking cessation . Will plan repeat echo at next visit. If EF remains < 35% will re-discuss ICD but she remains quite reluctant. With low EF and elevated BP will Increase Entresto today.   [name], MD  6:55 PM

## 2018-08-02 ENCOUNTER — Ambulatory Visit (INDEPENDENT_AMBULATORY_CARE_PROVIDER_SITE_OTHER): Payer: Medicare Other | Admitting: *Deleted

## 2018-08-02 DIAGNOSIS — I253 Aneurysm of heart: Secondary | ICD-10-CM

## 2018-08-02 DIAGNOSIS — Z7901 Long term (current) use of anticoagulants: Secondary | ICD-10-CM | POA: Diagnosis not present

## 2018-08-02 LAB — POCT INR: INR: 2.9 (ref 2.0–3.0)

## 2018-08-02 NOTE — Patient Instructions (Signed)
Description   Continue same coumadin dose 1 tablet every day except 1/2 tablet on Sundays.  Recheck in 6 weeks. Call with any questions of concerns [phone]

## 2018-08-12 ENCOUNTER — Ambulatory Visit (HOSPITAL_COMMUNITY)
Admission: RE | Admit: 2018-08-12 | Discharge: 2018-08-12 | Disposition: A | Discharge status: Home / Self Care | Payer: Medicare Other | Source: Ambulatory Visit | Attending: Cardiology | Admitting: Cardiology

## 2018-08-12 DIAGNOSIS — I5022 Chronic systolic (congestive) heart failure: Secondary | ICD-10-CM

## 2018-08-12 LAB — BASIC METABOLIC PANEL
Anion gap: 7 (ref 5–15)
BUN: 10 mg/dL (ref 8–23)
CALCIUM: 8.7 mg/dL — AB (ref 8.9–10.3)
CO2: 22 mmol/L (ref 22–32)
Chloride: 103 mmol/L (ref 98–111)
Creatinine, Ser: 1.29 mg/dL — ABNORMAL HIGH (ref 0.44–1.00)
GFR calc Af Amer: 45 mL/min — ABNORMAL LOW (ref >60)
GFR, EST NON AFRICAN AMERICAN: 39 mL/min — AB (ref >60)
GLUCOSE: 103 mg/dL — AB (ref 70–99)
Potassium: 4.8 mmol/L (ref 3.5–5.1)
Sodium: 132 mmol/L — ABNORMAL LOW (ref 135–145)

## 2018-08-13 ENCOUNTER — Other Ambulatory Visit (HOSPITAL_COMMUNITY): Payer: Self-pay | Admitting: Internal Medicine

## 2018-09-13 ENCOUNTER — Ambulatory Visit (INDEPENDENT_AMBULATORY_CARE_PROVIDER_SITE_OTHER): Payer: Medicare Other | Admitting: *Deleted

## 2018-09-13 ENCOUNTER — Encounter (INDEPENDENT_AMBULATORY_CARE_PROVIDER_SITE_OTHER): Payer: Self-pay

## 2018-09-13 DIAGNOSIS — I253 Aneurysm of heart: Secondary | ICD-10-CM

## 2018-09-13 DIAGNOSIS — Z7901 Long term (current) use of anticoagulants: Secondary | ICD-10-CM

## 2018-09-13 LAB — POCT INR: INR: 3.1 — AB (ref 2.0–3.0)

## 2018-09-13 NOTE — Patient Instructions (Signed)
Description   Today take 1/2 tablet, eat a serving of leafy green vegetable today,  Continue same coumadin dose 1 tablet every day except 1/2 tablet on Sundays.  Recheck in 6 weeks. Call with any questions of concerns [phone]

## 2018-10-23 IMAGING — DX DG SACRUM/COCCYX 2+V
3 series · 3 of 3 positions shown · non-contrast
Comparison: None.

CLINICAL DATA: Swelling over the sacrum and coccyx, no history of
trauma

EXAM:
SACRUM AND COCCYX - 2+ VIEW

[sacrum ap]
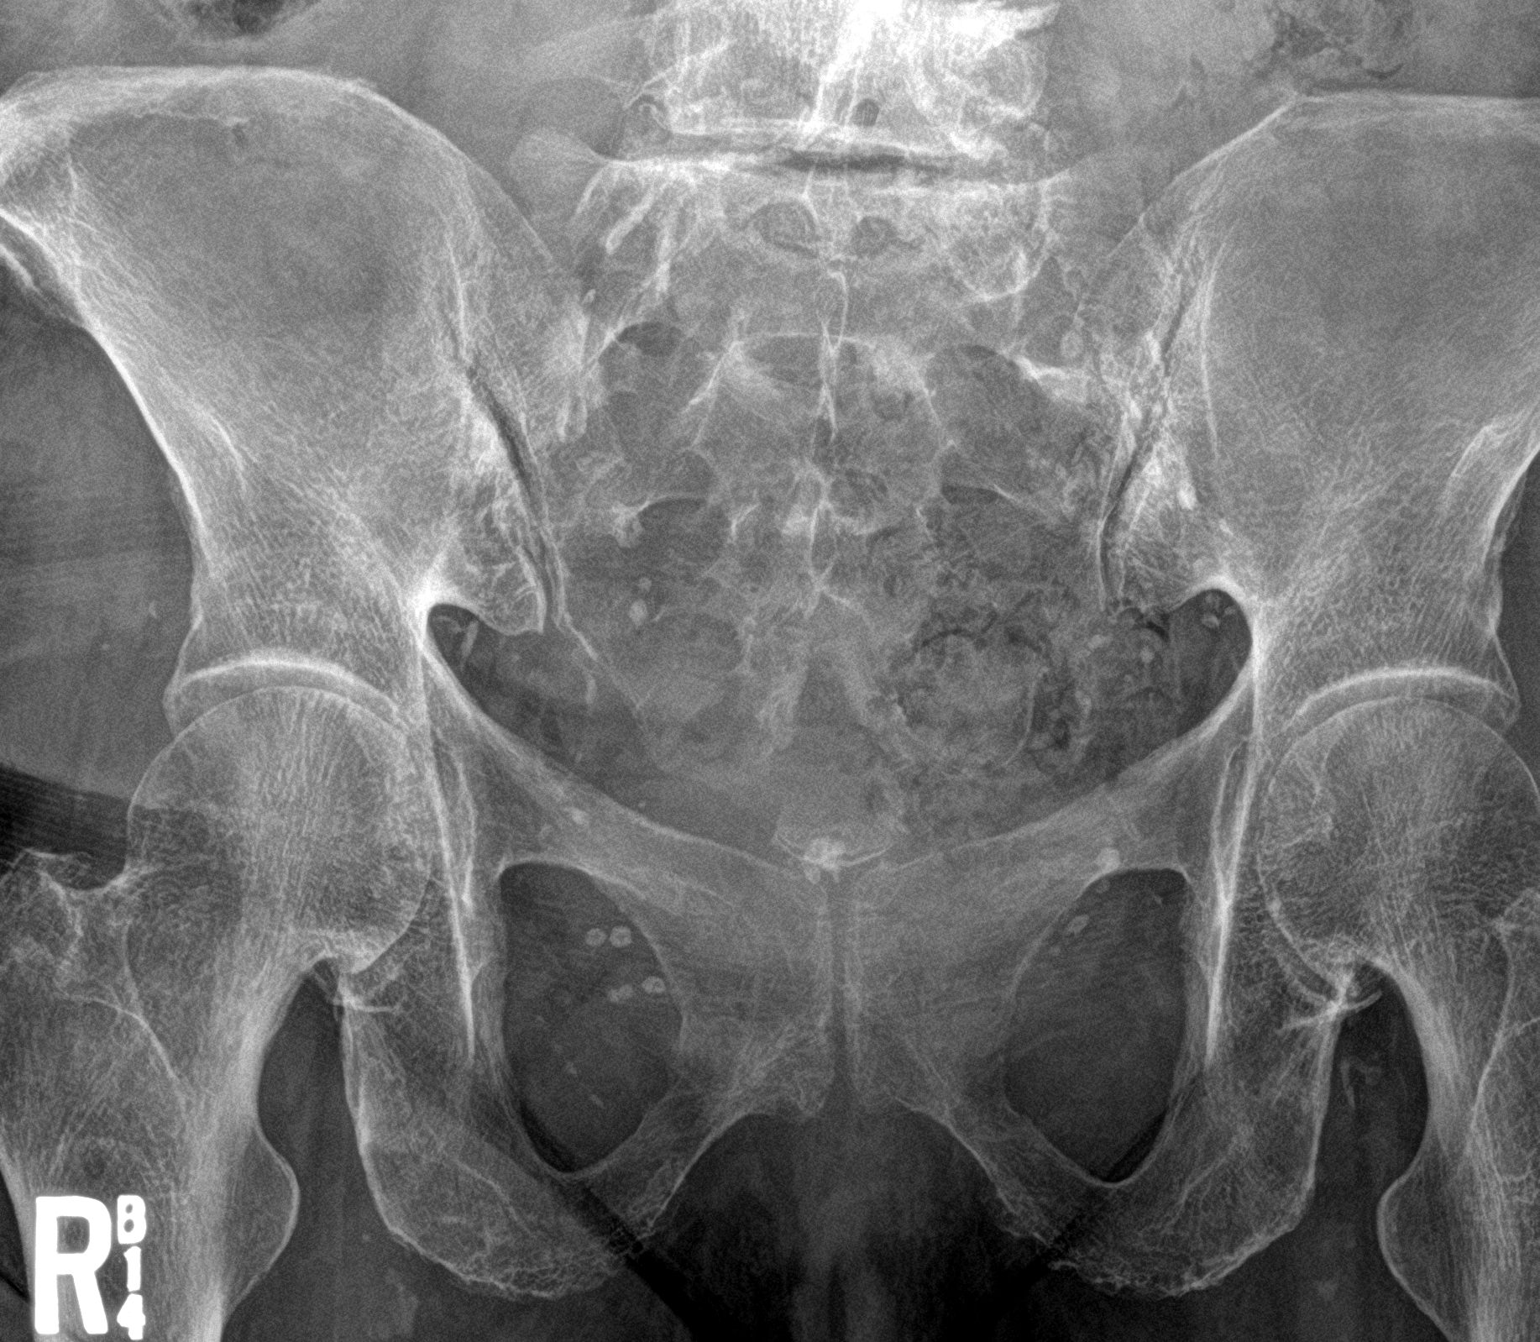

[coccyx ap]
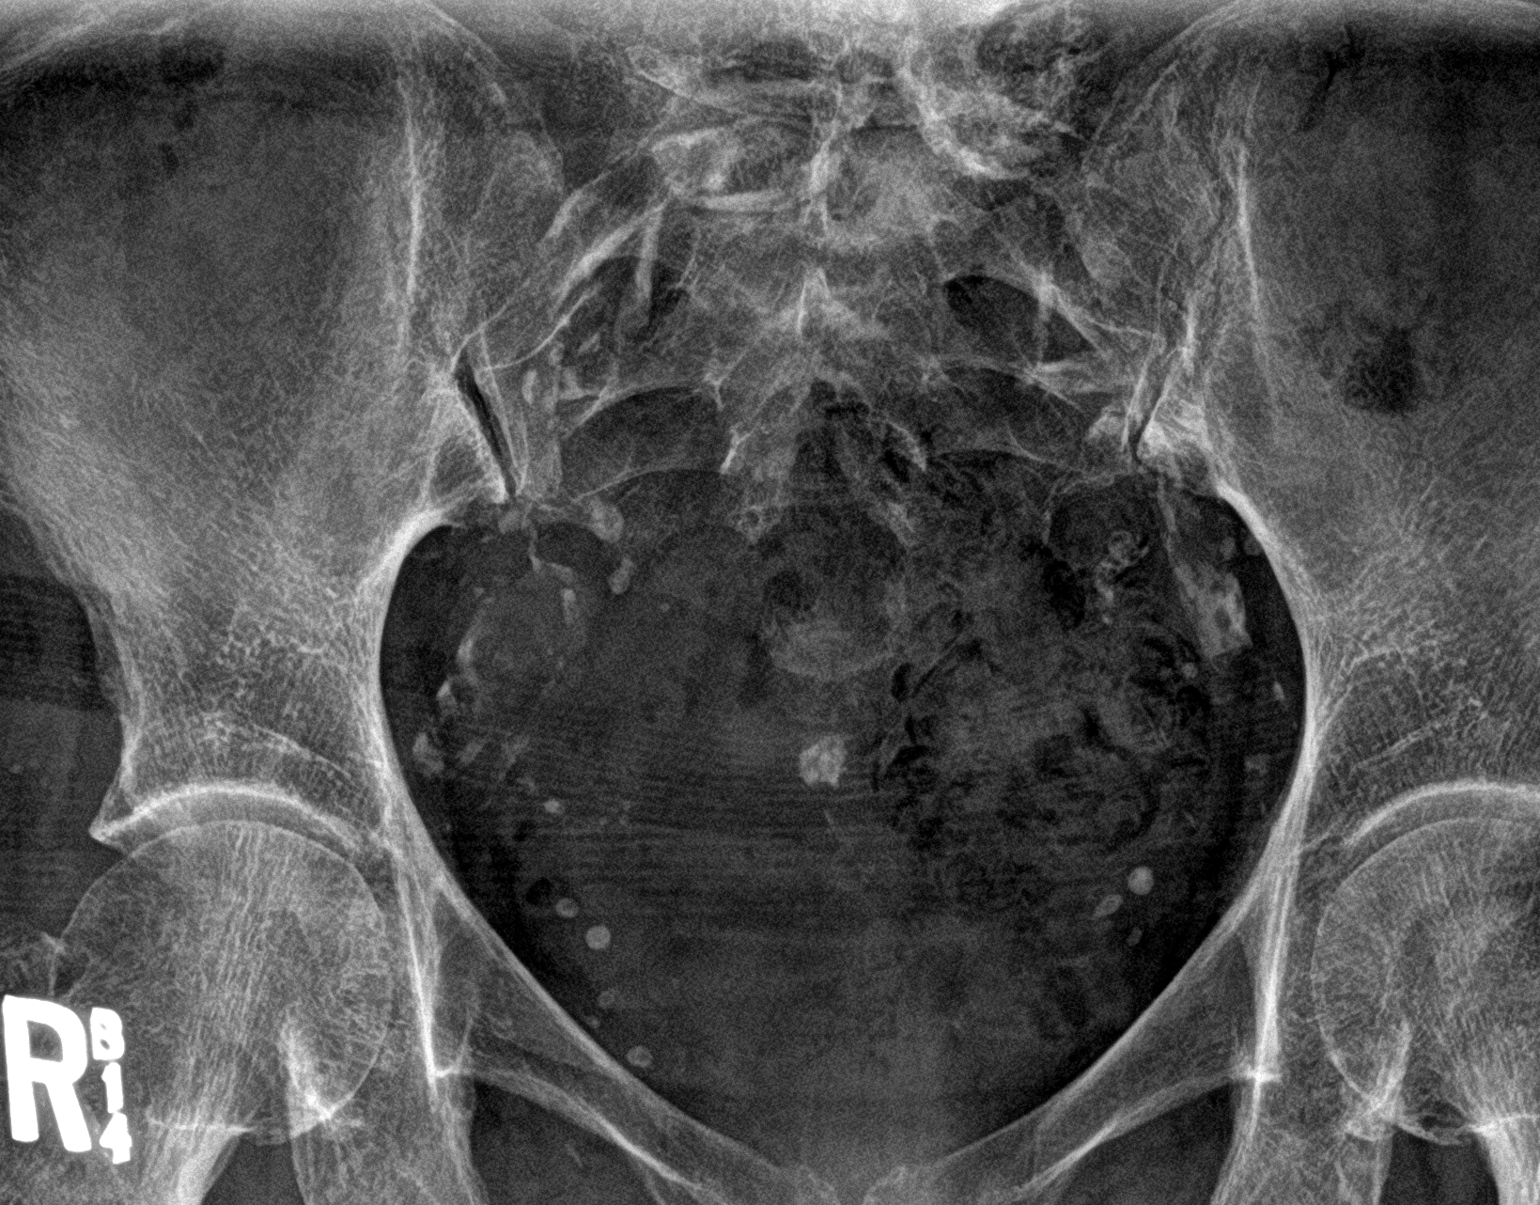

[sacrum lat]
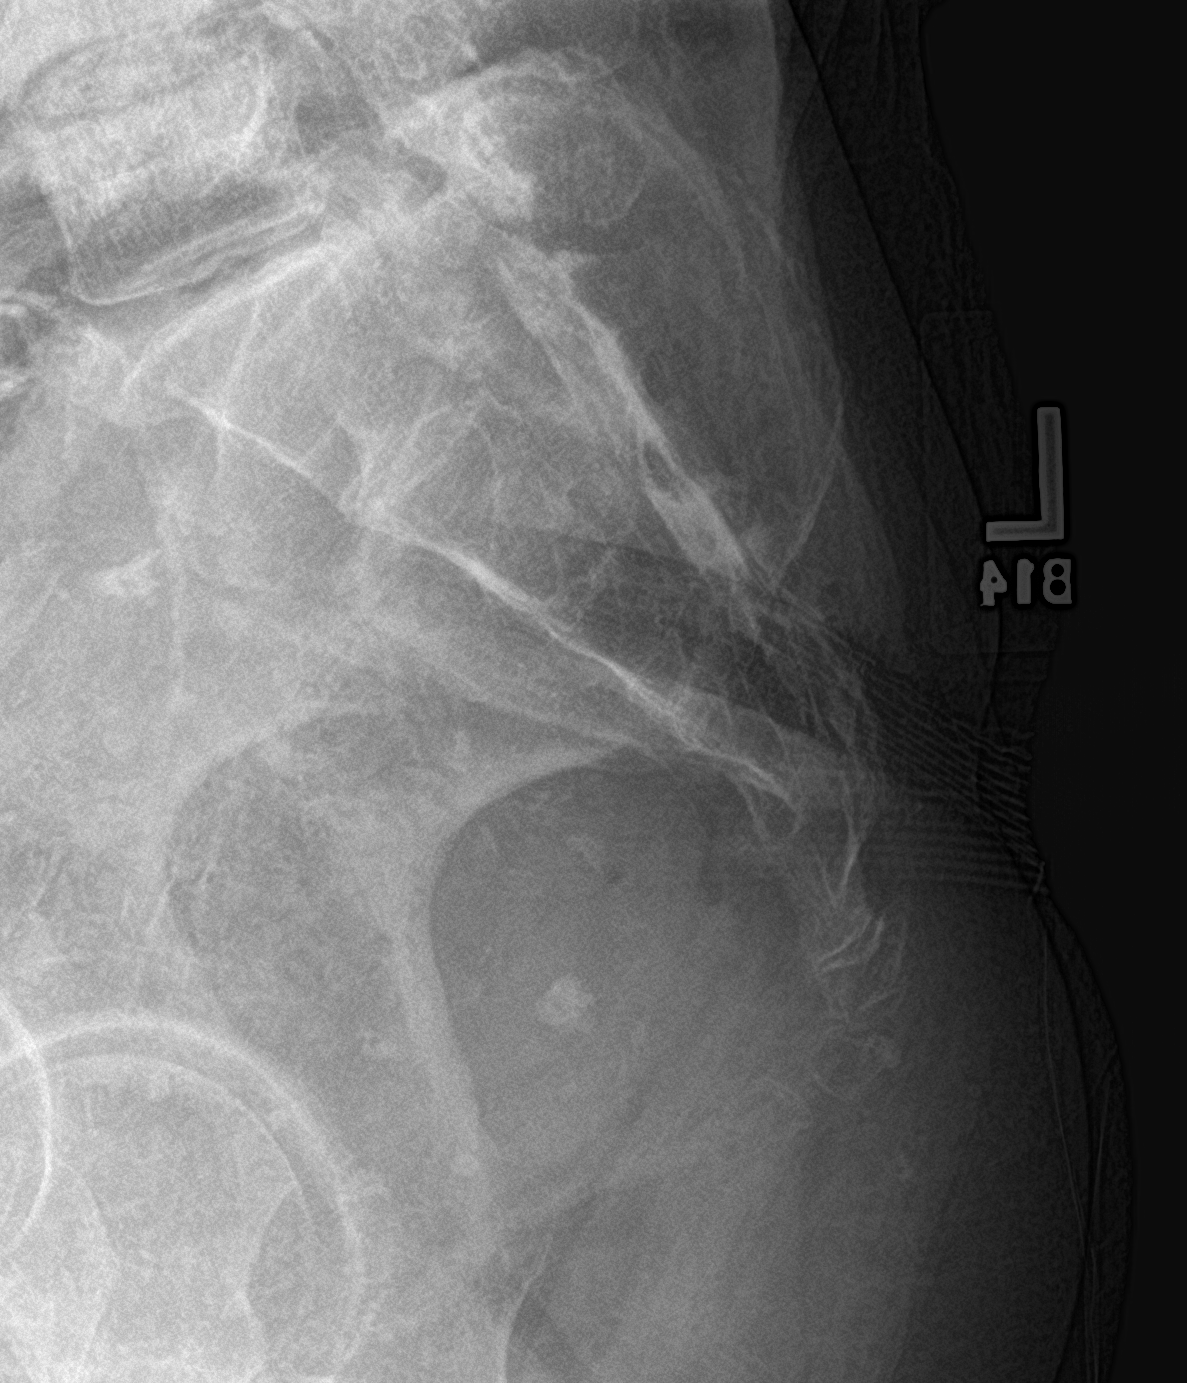

[3 of 3 positions shown; findings below may reference images not displayed]

FINDINGS: The sacrococcygeal elements are in normal alignment. No acute
fracture is seen. The SI joints appear well corticated and the
sacral foramina appear normal. The pelvic rami are intact..
IMPRESSION: Negative.

## 2018-10-31 ENCOUNTER — Telehealth (HOSPITAL_COMMUNITY): Payer: Self-pay

## 2018-10-31 NOTE — Telephone Encounter (Signed)
Pt approved for Entresto 49/51mg  through 08/29/2019 via [facility]

## 2018-11-06 ENCOUNTER — Ambulatory Visit (INDEPENDENT_AMBULATORY_CARE_PROVIDER_SITE_OTHER): Payer: Medicare Other | Admitting: *Deleted

## 2018-11-06 DIAGNOSIS — Z7901 Long term (current) use of anticoagulants: Secondary | ICD-10-CM | POA: Diagnosis not present

## 2018-11-06 DIAGNOSIS — I253 Aneurysm of heart: Secondary | ICD-10-CM

## 2018-11-06 LAB — POCT INR: INR: 2.6 (ref 2.0–3.0)

## 2018-11-06 NOTE — Patient Instructions (Signed)
Description   Continue same coumadin dose 1 tablet every day except 1/2 tablet on Sundays.  Recheck in 6 weeks. Call with any questions of concerns [phone]

## 2018-12-19 ENCOUNTER — Telehealth: Payer: Self-pay

## 2018-12-19 NOTE — Telephone Encounter (Signed)
Called to screen pt for appt but the pt had to cancel due to transportation issues

## 2018-12-23 ENCOUNTER — Other Ambulatory Visit (HOSPITAL_COMMUNITY): Payer: Self-pay | Admitting: Internal Medicine

## 2018-12-23 DIAGNOSIS — I253 Aneurysm of heart: Secondary | ICD-10-CM

## 2019-01-02 ENCOUNTER — Telehealth: Payer: Self-pay

## 2019-01-02 NOTE — Telephone Encounter (Signed)
Voicemail not set up.

## 2019-01-03 ENCOUNTER — Ambulatory Visit (INDEPENDENT_AMBULATORY_CARE_PROVIDER_SITE_OTHER): Payer: Medicare Other | Admitting: *Deleted

## 2019-01-03 ENCOUNTER — Other Ambulatory Visit: Payer: Self-pay

## 2019-01-03 DIAGNOSIS — Z7901 Long term (current) use of anticoagulants: Secondary | ICD-10-CM | POA: Diagnosis not present

## 2019-01-03 DIAGNOSIS — I253 Aneurysm of heart: Secondary | ICD-10-CM | POA: Diagnosis not present

## 2019-01-03 LAB — POCT INR: INR: 2.9 (ref 2.0–3.0)

## 2019-02-03 ENCOUNTER — Other Ambulatory Visit (HOSPITAL_COMMUNITY): Payer: Self-pay | Admitting: Internal Medicine

## 2019-02-05 ENCOUNTER — Other Ambulatory Visit (HOSPITAL_COMMUNITY): Payer: Self-pay

## 2019-02-05 MED ORDER — SACUBITRIL-VALSARTAN 49-51 MG PO TABS: 1.0000 | ORAL_TABLET | Freq: Two times a day (BID) | ORAL | 6 refills | Status: DC

## 2019-02-20 ENCOUNTER — Telehealth: Payer: Self-pay

## 2019-02-20 NOTE — Telephone Encounter (Signed)
Unable to lmom for prescreen  

## 2019-02-28 NOTE — Telephone Encounter (Signed)

## 2019-03-04 ENCOUNTER — Ambulatory Visit (INDEPENDENT_AMBULATORY_CARE_PROVIDER_SITE_OTHER): Payer: Medicare Other | Admitting: Pharmacist

## 2019-03-04 ENCOUNTER — Other Ambulatory Visit: Payer: Self-pay

## 2019-03-04 DIAGNOSIS — I253 Aneurysm of heart: Secondary | ICD-10-CM

## 2019-03-04 DIAGNOSIS — Z7901 Long term (current) use of anticoagulants: Secondary | ICD-10-CM

## 2019-03-04 LAB — POCT INR: INR: 1.5 — AB (ref 2.0–3.0)

## 2019-03-04 NOTE — Patient Instructions (Signed)
Description   Take 1.5 tablets today and tomorrow, then continue same coumadin dose 1 tablet every day except 1/2 tablet on Sundays.  Recheck in 2 weeks. Call with any questions of concerns [phone]

## 2019-03-11 ENCOUNTER — Telehealth: Payer: Self-pay

## 2019-03-11 NOTE — Telephone Encounter (Signed)
Unable to lmom for prescreen. Novm setup

## 2019-03-18 ENCOUNTER — Ambulatory Visit (INDEPENDENT_AMBULATORY_CARE_PROVIDER_SITE_OTHER): Payer: Medicare Other | Admitting: *Deleted

## 2019-03-18 ENCOUNTER — Telehealth (HOSPITAL_COMMUNITY): Payer: Self-pay | Admitting: Pharmacy Technician

## 2019-03-18 ENCOUNTER — Other Ambulatory Visit: Payer: Self-pay

## 2019-03-18 DIAGNOSIS — Z7901 Long term (current) use of anticoagulants: Secondary | ICD-10-CM

## 2019-03-18 DIAGNOSIS — I253 Aneurysm of heart: Secondary | ICD-10-CM | POA: Diagnosis not present

## 2019-03-18 LAB — POCT INR: INR: 2.3 (ref 2.0–3.0)

## 2019-03-18 NOTE — Telephone Encounter (Signed)
Advanced Heart Failure Patient Advocate Encounter  Prior Authorization for Delene Loll has been approved.    Effective dates:  through 08/29/2019  Jodelle Gross ([place]) [place] Patient Advocate [phone] 03/18/2019 1:54 PM

## 2019-03-18 NOTE — Patient Instructions (Addendum)
Description   Continue same coumadin dose 1 tablet every day except 1/2 tablet on Sundays.  Recheck in 3 weeks. Call with any questions or concerns [phone]

## 2019-04-07 ENCOUNTER — Telehealth: Payer: Self-pay

## 2019-04-07 NOTE — Patient Instructions (Addendum)
  Description   Continue same coumadin dose 1 tablet every day except 1/2 tablet on Sundays.  Recheck in 4 weeks. Call with any questions or concerns [phone]

## 2019-04-07 NOTE — Telephone Encounter (Signed)

## 2019-04-09 ENCOUNTER — Ambulatory Visit (INDEPENDENT_AMBULATORY_CARE_PROVIDER_SITE_OTHER): Payer: Medicare Other

## 2019-04-09 ENCOUNTER — Other Ambulatory Visit: Payer: Self-pay

## 2019-04-09 DIAGNOSIS — I253 Aneurysm of heart: Secondary | ICD-10-CM

## 2019-04-09 DIAGNOSIS — Z7901 Long term (current) use of anticoagulants: Secondary | ICD-10-CM | POA: Diagnosis not present

## 2019-04-09 LAB — POCT INR: INR: 2 (ref 2.0–3.0)

## 2019-05-14 ENCOUNTER — Ambulatory Visit (INDEPENDENT_AMBULATORY_CARE_PROVIDER_SITE_OTHER): Payer: Medicare Other | Admitting: *Deleted

## 2019-05-14 ENCOUNTER — Other Ambulatory Visit: Payer: Self-pay

## 2019-05-14 DIAGNOSIS — I253 Aneurysm of heart: Secondary | ICD-10-CM | POA: Diagnosis not present

## 2019-05-14 DIAGNOSIS — Z7901 Long term (current) use of anticoagulants: Secondary | ICD-10-CM | POA: Diagnosis not present

## 2019-05-14 LAB — POCT INR: INR: 1.7 — AB (ref 2.0–3.0)

## 2019-05-14 NOTE — Patient Instructions (Signed)
Description   Today take 1.5 tablets then continue same coumadin dose 1 tablet every day except 1/2 tablet on Sundays.  Recheck in 3 weeks. Call with any questions or concerns [phone]

## 2019-05-17 ENCOUNTER — Other Ambulatory Visit (HOSPITAL_COMMUNITY): Payer: Self-pay | Admitting: Internal Medicine

## 2019-05-19 DIAGNOSIS — M1990 Unspecified osteoarthritis, unspecified site: Secondary | ICD-10-CM | POA: Diagnosis not present

## 2019-05-28 ENCOUNTER — Other Ambulatory Visit (HOSPITAL_COMMUNITY): Payer: Self-pay

## 2019-05-28 MED ORDER — HYDRALAZINE HCL 50 MG PO TABS: ORAL_TABLET | ORAL | 2 refills | Status: DC

## 2019-06-06 ENCOUNTER — Ambulatory Visit (INDEPENDENT_AMBULATORY_CARE_PROVIDER_SITE_OTHER): Payer: Medicare Other

## 2019-06-06 ENCOUNTER — Other Ambulatory Visit: Payer: Self-pay

## 2019-06-06 DIAGNOSIS — Z7901 Long term (current) use of anticoagulants: Secondary | ICD-10-CM | POA: Diagnosis not present

## 2019-06-06 DIAGNOSIS — I253 Aneurysm of heart: Secondary | ICD-10-CM | POA: Diagnosis not present

## 2019-06-06 LAB — POCT INR: INR: 1.9 — AB (ref 2.0–3.0)

## 2019-06-06 NOTE — Patient Instructions (Signed)
Description   Start taking 1 tablet every day.  Recheck in 3 weeks. Call with any questions or concerns [phone]

## 2019-06-12 ENCOUNTER — Other Ambulatory Visit (HOSPITAL_COMMUNITY): Payer: Self-pay | Admitting: Internal Medicine

## 2019-06-12 DIAGNOSIS — I253 Aneurysm of heart: Secondary | ICD-10-CM

## 2019-07-17 ENCOUNTER — Other Ambulatory Visit: Payer: Self-pay

## 2019-07-17 ENCOUNTER — Ambulatory Visit (INDEPENDENT_AMBULATORY_CARE_PROVIDER_SITE_OTHER): Payer: Medicare Other | Admitting: *Deleted

## 2019-07-17 DIAGNOSIS — Z7901 Long term (current) use of anticoagulants: Secondary | ICD-10-CM | POA: Diagnosis not present

## 2019-07-17 DIAGNOSIS — I253 Aneurysm of heart: Secondary | ICD-10-CM | POA: Diagnosis not present

## 2019-07-17 LAB — POCT INR: INR: 3.6 — AB (ref 2.0–3.0)

## 2019-07-17 NOTE — Patient Instructions (Signed)
Description   Do not take any Coumadin tomorrow then start taking 1 tablet every day except 1/2 tablet on Sundays and Thursdays.  Recheck in 2 weeks. Call with any questions or concerns [phone]

## 2019-08-01 ENCOUNTER — Other Ambulatory Visit: Payer: Self-pay

## 2019-08-01 ENCOUNTER — Ambulatory Visit (INDEPENDENT_AMBULATORY_CARE_PROVIDER_SITE_OTHER): Payer: Medicare Other | Admitting: *Deleted

## 2019-08-01 DIAGNOSIS — Z5181 Encounter for therapeutic drug level monitoring: Secondary | ICD-10-CM | POA: Diagnosis not present

## 2019-08-01 DIAGNOSIS — I253 Aneurysm of heart: Secondary | ICD-10-CM | POA: Diagnosis not present

## 2019-08-01 DIAGNOSIS — Z7901 Long term (current) use of anticoagulants: Secondary | ICD-10-CM | POA: Diagnosis not present

## 2019-08-01 LAB — POCT INR: INR: 2.2 (ref 2.0–3.0)

## 2019-08-01 NOTE — Patient Instructions (Signed)
Description   Continue taking 1 tablet every day except 1/2 tablet on Sundays and Thursdays.  Recheck in 3 weeks. Call with any questions or concerns [phone]

## 2019-08-21 ENCOUNTER — Encounter (HOSPITAL_COMMUNITY): Payer: Self-pay | Admitting: Emergency Medicine

## 2019-08-21 ENCOUNTER — Ambulatory Visit (HOSPITAL_COMMUNITY)
Admission: EM | Admit: 2019-08-21 | Discharge: 2019-08-21 | Disposition: A | Discharge status: Home / Self Care | Payer: Medicare Other | Attending: Family Medicine | Admitting: Family Medicine

## 2019-08-21 ENCOUNTER — Other Ambulatory Visit: Payer: Self-pay

## 2019-08-21 DIAGNOSIS — K1120 Sialoadenitis, unspecified: Secondary | ICD-10-CM | POA: Diagnosis not present

## 2019-08-21 MED ORDER — DOXYCYCLINE HYCLATE 100 MG PO TABS: 100.0000 mg | ORAL_TABLET | Freq: Two times a day (BID) | ORAL | 0 refills | Status: AC

## 2019-08-21 NOTE — ED Triage Notes (Signed)
Patient noticed right neck swelling.  Area is a little uncomfortable.  Denies sore throat.  Recently had some sneezing and had a runny nose

## 2019-08-21 NOTE — ED Provider Notes (Signed)
[facility]    CSN: [id number] Arrival date & time: 08/21/19  1914      History   Chief Complaint Chief Complaint  Patient presents with  . Neck Pain    HPI Faith Gray is a 78 y.o. female.   She is presenting with fullness on the right submandibular region.  She palpated a large lump there last night when she was taking a shower.  There is some tenderness when she was touching the area.  She denies any fever or chills.  Denies any trauma to the area.  No recent dental infections.  No prior incidence of similar symptoms.  HPI  Past Medical History:  Diagnosis Date  . Anemia   . Arthritis   . CAD (coronary artery disease) 09/11/2012  . CHF (congestive heart failure) ([place])   . Dental caries   . Essential hypertension 10/04/2012  . Ischemic cardiomyopathy   . NSTEMI (non-ST elevated myocardial infarction) ([place]) 09/11/2012  . Shortness of breath     Patient Active Problem List   Diagnosis Date Noted  . Syncope 02/24/2013  . Essential hypertension 10/04/2012  . Chronic systolic heart failure ([place]) 09/17/2012  . Aneurysm of left ventricle of heart 09/16/2012  . Long term (current) use of anticoagulants 09/16/2012  . CAD (coronary artery disease) 09/11/2012  . NSTEMI (non-ST elevated myocardial infarction) ([place]) 09/11/2012  . Acute systolic heart failure ([place]) 09/09/2012  . Chest tightness 09/09/2012  . CHF exacerbation ([place]) 09/07/2012  . SOB (shortness of breath) 09/07/2012  . Chest pain 09/07/2012  . EKG abnormalities 09/07/2012  . Normochromic anemia 09/07/2012    Past Surgical History:  Procedure Laterality Date  . CARDIAC CATHETERIZATION    . LEFT AND RIGHT HEART CATHETERIZATION WITH CORONARY ANGIOGRAM N/A 09/10/2012   Procedure: LEFT AND RIGHT HEART CATHETERIZATION WITH CORONARY ANGIOGRAM;  Surgeon: Jolaine Artist, MD;  Location[facility] C[facility] LAB;  Service: Cardiovascular;  Laterality: N/A;  . MULTIPLE EXTRACTIONS WITH ALVEOLOPLASTY N/A 02/16/2017   Procedure: MULTIPLE EXTRACTION WITH ALVEOLOPLASTY;  Surgeon: Diona Browner, DDS;  Location: [facility];  Service: Oral Surgery;  Laterality: N/A;    OB History   No obstetric history on file.      Home Medications    Prior to Admission medications   Medication Sig Start Date End Date Taking? Authorizing Provider  atorvastatin (LIPITOR) 80 MG tablet TAKE 1 TABLET BY MOUTH DAILY AT 6PM 05/19/19  Yes Bensimhon, Shaune Pascal, MD  carvedilol (COREG) 12.5 MG tablet TAKE 1 TABLET (12.5 MG TOTAL) BY MOUTH 2 (TWO) TIMES DAILY WITH A MEAL. 05/19/19  Yes Bensimhon, Shaune Pascal, MD  furosemide (LASIX) 40 MG tablet Take 1 tablet (40 mg total) by mouth as needed. 02/05/18  Yes Bensimhon, Shaune Pascal, MD  hydrALAZINE (APRESOLINE) 50 MG tablet TAKE 1 AND 1/2 TABLETS THREE TIMES DAILY ( DOSE INCREASE ) 05/28/19  Yes Bensimhon, Shaune Pascal, MD  isosorbide mononitrate (IMDUR) 60 MG 24 hr tablet TAKE 1 TABLET EVERY DAY ( DOSE INCREASE ) 05/19/19  Yes Bensimhon, Shaune Pascal, MD  sacubitril-valsartan (ENTRESTO) 49-51 MG Take 1 tablet by mouth 2 (two) times daily. 02/05/19  Yes Bensimhon, Shaune Pascal, MD  warfarin (COUMADIN) 6 MG tablet TAKE 1 TABLET EVERY DAY EXCEPT TAKE 1/2 TABLET ON SUNDAY OR AS DIRECTED BY COUMADIN CLINIC 06/13/19  Yes Bensimhon, Shaune Pascal, MD  doxycycline (VIBRA-TABS) 100 MG tablet Take 1 tablet (100 mg total) by mouth 2 (two) times daily for 7 days. 08/21/19 08/28/19  Rosemarie Ax,  MD  ondansetron (ZOFRAN) 4 MG tablet Take 1 tablet (4 mg total) by mouth every 8 (eight) hours as needed for nausea or vomiting. 02/16/17   Diona Browner, DDS  oxyCODONE-acetaminophen (PERCOCET) 5-325 MG tablet Take 1-2 tablets by mouth every 4 (four) hours as needed. 02/16/17   Diona Browner, DDS  polyethylene glycol powder (GLYCOLAX/MIRALAX) powder Take 17 g by mouth daily. Patient taking differently: Take 17 g by mouth daily as needed for moderate constipation.  10/22/13   Janne Napoleon, NP    Family History Family History  Problem Relation  Age of Onset  . Sickle cell anemia Brother     Social History Social History   Tobacco Use  . Smoking status: Former Smoker    Packs/day: 0.50    Years: 20.00    Pack years: 10.00    Types: Cigarettes  . Smokeless tobacco: Never Used  Substance Use Topics  . Alcohol use: No  . Drug use: No     Allergies   Vicodin [hydrocodone-acetaminophen]   Review of Systems Review of Systems  Constitutional: Negative for fever.  HENT: Negative for congestion.   Respiratory: Negative for cough.   Cardiovascular: Negative for chest pain.  Gastrointestinal: Negative for abdominal distention.  Musculoskeletal: Negative for gait problem.  Skin: Negative for color change.  Neurological: Negative for weakness.  Hematological: Negative for adenopathy.     Physical Exam Triage Vital Signs ED Triage Vitals  Enc Vitals Group     BP 08/21/19 1955 (!) 164/71     Pulse Rate 08/21/19 1955 (!) 59     Resp 08/21/19 1955 18     Temp 08/21/19 1955 98.2 F (36.8 C)     Temp Source 08/21/19 1955 Oral     SpO2 08/21/19 1955 100 %     Weight --      Height --      Head Circumference --      Peak Flow --      Pain Score 08/21/19 1950 8     Pain Loc --      Pain Edu? --      Excl. in [place]? --    No data found.  Updated Vital Signs BP (!) 164/71 (BP Location: Right Arm)   Pulse (!) 59   Temp 98.2 F (36.8 C) (Oral)   Resp 18   SpO2 100%   Visual Acuity Right Eye Distance:   Left Eye Distance:   Bilateral Distance:    Right Eye Near:   Left Eye Near:    Bilateral Near:     Physical Exam Gen: NAD, alert, cooperative with exam, well-appearing ENT: normal lips, normal nasal mucosa, tympanic membranes clear and intact bilaterally, normal oropharynx, large firm mass palpated on the right submandibular region that is mobile.  Palpation of this area did not have any purulent expression into the mouth Eye: normal EOM, normal conjunctiva and lids CV:  no edema, +2 pedal pulses, Resp: no  accessory muscle use, non-labored, clear to auscultation bilaterally, no crackles or wheezes Skin: no rashes, no areas of induration  Neuro: normal tone, normal sensation to touch Psych:  normal insight, alert and oriented MSK: Normal gait, normal strength    UC Treatments / Results  Labs (all labs ordered are listed, but only abnormal results are displayed) Labs Reviewed - No data to display  EKG   Radiology No results found.  Procedures Procedures (including critical care time)  Medications Ordered in UC Medications - No data to  display  Initial Impression / Assessment and Plan / UC Course  I have reviewed the triage vital signs and the nursing notes.  Pertinent labs & imaging results that were available during my care of the patient were reviewed by me and considered in my medical decision making (see chart for details).     Ms. Vereen is a 78 year old female that is presenting with a swollen gland on the right submandibular region to suspect sialadenitis.  No signs of infection today.  Counseled on supportive care.  Advised to try conservative management for a couple of days.  Did send in doxycycline if she does develop infectious type symptoms.  Given indications to follow-up.  Final Clinical Impressions(s) / UC Diagnoses   Final diagnoses:  Sialoadenitis     Discharge Instructions     Please stay well hydrated  Apply moist heat to the area  Please try using tart, hard candies to suck on  Please do not take the antibiotics unless your symptoms are not improving.  Please follow up with your primary doctor.     ED Prescriptions    Medication Sig Dispense Auth. Provider   doxycycline (VIBRA-TABS) 100 MG tablet Take 1 tablet (100 mg total) by mouth 2 (two) times daily for 7 days. 14 tablet Rosemarie Ax, MD     PDMP not reviewed this encounter.   Rosemarie Ax, MD 08/21/19 [phone]

## 2019-08-21 NOTE — Discharge Instructions (Addendum)
Please stay well hydrated  Apply moist heat to the area  Please try using tart, hard candies to suck on  Please do not take the antibiotics unless your symptoms are not improving.  Please follow up with your primary doctor.

## 2019-08-26 ENCOUNTER — Ambulatory Visit (INDEPENDENT_AMBULATORY_CARE_PROVIDER_SITE_OTHER): Payer: Medicare Other

## 2019-08-26 ENCOUNTER — Other Ambulatory Visit: Payer: Self-pay

## 2019-08-26 DIAGNOSIS — Z7901 Long term (current) use of anticoagulants: Secondary | ICD-10-CM

## 2019-08-26 DIAGNOSIS — I253 Aneurysm of heart: Secondary | ICD-10-CM | POA: Diagnosis not present

## 2019-08-26 LAB — POCT INR: INR: 1.9 — AB (ref 2.0–3.0)

## 2019-08-26 NOTE — Patient Instructions (Signed)
Description   Take 1.5 tablets today, then resume same dosage 1 tablet every day except 1/2 tablet on Sundays and Thursdays.  Recheck in 3 weeks. Call with any questions or concerns [phone]

## 2019-09-06 ENCOUNTER — Other Ambulatory Visit (HOSPITAL_COMMUNITY): Payer: Self-pay | Admitting: Internal Medicine

## 2019-09-16 ENCOUNTER — Other Ambulatory Visit: Payer: Self-pay

## 2019-09-16 ENCOUNTER — Ambulatory Visit (INDEPENDENT_AMBULATORY_CARE_PROVIDER_SITE_OTHER): Payer: Medicare Other | Admitting: *Deleted

## 2019-09-16 DIAGNOSIS — Z7901 Long term (current) use of anticoagulants: Secondary | ICD-10-CM | POA: Diagnosis not present

## 2019-09-16 DIAGNOSIS — I253 Aneurysm of heart: Secondary | ICD-10-CM | POA: Diagnosis not present

## 2019-09-16 LAB — POCT INR: INR: 1.2 — AB (ref 2.0–3.0)

## 2019-09-16 NOTE — Patient Instructions (Addendum)
Description   Take 1.5 tablets today and tomorrow, then start taking 1 tablet every day except 1/2 tablet on Thursdays.  Recheck in 1 week. Call with any questions or concerns [phone]

## 2019-09-26 ENCOUNTER — Other Ambulatory Visit: Payer: Self-pay

## 2019-09-26 ENCOUNTER — Ambulatory Visit (INDEPENDENT_AMBULATORY_CARE_PROVIDER_SITE_OTHER): Payer: Medicare Other | Admitting: *Deleted

## 2019-09-26 DIAGNOSIS — Z7901 Long term (current) use of anticoagulants: Secondary | ICD-10-CM | POA: Diagnosis not present

## 2019-09-26 DIAGNOSIS — I253 Aneurysm of heart: Secondary | ICD-10-CM | POA: Diagnosis not present

## 2019-09-26 LAB — POCT INR: INR: 2.5 (ref 2.0–3.0)

## 2019-09-26 NOTE — Patient Instructions (Signed)
Description   Continue taking 1 tablet every day except 1/2 tablet on Thursdays.  Recheck in 3-4 weeks. Call with any questions or concerns [phone]

## 2019-10-02 ENCOUNTER — Other Ambulatory Visit (HOSPITAL_COMMUNITY): Payer: Self-pay

## 2019-10-02 MED ORDER — ISOSORBIDE MONONITRATE ER 60 MG PO TB24: ORAL_TABLET | ORAL | 3 refills | Status: DC

## 2019-10-07 ENCOUNTER — Other Ambulatory Visit (HOSPITAL_COMMUNITY): Payer: Self-pay

## 2019-10-07 MED ORDER — CARVEDILOL 12.5 MG PO TABS: 12.5000 mg | ORAL_TABLET | Freq: Two times a day (BID) | ORAL | 1 refills | Status: DC

## 2019-10-07 MED ORDER — ISOSORBIDE MONONITRATE ER 60 MG PO TB24: ORAL_TABLET | ORAL | 3 refills | Status: DC

## 2019-11-06 ENCOUNTER — Other Ambulatory Visit (HOSPITAL_COMMUNITY): Payer: Self-pay | Admitting: Internal Medicine

## 2019-11-06 DIAGNOSIS — I253 Aneurysm of heart: Secondary | ICD-10-CM

## 2019-11-06 NOTE — Telephone Encounter (Signed)
Followed by coumadin clinic

## 2019-11-07 ENCOUNTER — Other Ambulatory Visit: Payer: Self-pay

## 2019-11-07 ENCOUNTER — Ambulatory Visit (INDEPENDENT_AMBULATORY_CARE_PROVIDER_SITE_OTHER): Payer: Medicare Other | Admitting: *Deleted

## 2019-11-07 DIAGNOSIS — Z7901 Long term (current) use of anticoagulants: Secondary | ICD-10-CM | POA: Diagnosis not present

## 2019-11-07 DIAGNOSIS — I253 Aneurysm of heart: Secondary | ICD-10-CM

## 2019-11-07 LAB — POCT INR: INR: 2.3 (ref 2.0–3.0)

## 2019-11-07 NOTE — Patient Instructions (Addendum)
Description   Continue taking 1 tablet every day except 1/2 tablet on Thursdays.  Recheck in 4 weeks. Call with any questions or concerns [phone]    Please make an appt with Dr. Haroldine Laws.

## 2019-11-10 ENCOUNTER — Other Ambulatory Visit (HOSPITAL_COMMUNITY): Payer: Self-pay | Admitting: Internal Medicine

## 2019-12-15 ENCOUNTER — Other Ambulatory Visit (HOSPITAL_COMMUNITY): Payer: Self-pay | Admitting: Internal Medicine

## 2019-12-19 ENCOUNTER — Other Ambulatory Visit: Payer: Self-pay

## 2019-12-19 ENCOUNTER — Ambulatory Visit (INDEPENDENT_AMBULATORY_CARE_PROVIDER_SITE_OTHER): Payer: Medicare Other

## 2019-12-19 DIAGNOSIS — Z7901 Long term (current) use of anticoagulants: Secondary | ICD-10-CM | POA: Diagnosis not present

## 2019-12-19 DIAGNOSIS — I253 Aneurysm of heart: Secondary | ICD-10-CM | POA: Diagnosis not present

## 2019-12-19 LAB — POCT INR: INR: 2.4 (ref 2.0–3.0)

## 2019-12-19 NOTE — Patient Instructions (Signed)
Description   Continue on same dosage 1 tablet every day except 1/2 tablet on Thursdays.  Recheck in 6 weeks. Call with any questions or concerns [phone]

## 2020-01-08 ENCOUNTER — Other Ambulatory Visit (HOSPITAL_COMMUNITY): Payer: Self-pay

## 2020-01-08 MED ORDER — HYDRALAZINE HCL 50 MG PO TABS: 75.0000 mg | ORAL_TABLET | Freq: Three times a day (TID) | ORAL | 2 refills | Status: DC

## 2020-01-13 ENCOUNTER — Other Ambulatory Visit (HOSPITAL_COMMUNITY): Payer: Self-pay | Admitting: Internal Medicine

## 2020-01-13 DIAGNOSIS — I253 Aneurysm of heart: Secondary | ICD-10-CM

## 2020-01-27 ENCOUNTER — Ambulatory Visit (HOSPITAL_COMMUNITY)
Admission: EM | Admit: 2020-01-27 | Discharge: 2020-01-27 | Disposition: A | Discharge status: Home / Self Care | Payer: Medicare Other | Attending: Family Medicine | Admitting: Family Medicine

## 2020-01-27 ENCOUNTER — Other Ambulatory Visit: Payer: Self-pay

## 2020-01-27 ENCOUNTER — Encounter (HOSPITAL_COMMUNITY): Payer: Self-pay

## 2020-01-27 DIAGNOSIS — M25531 Pain in right wrist: Secondary | ICD-10-CM

## 2020-01-27 MED ORDER — PREDNISONE 20 MG PO TABS: 20.0000 mg | ORAL_TABLET | Freq: Every day | ORAL | 0 refills | Status: AC

## 2020-01-27 MED ORDER — TRAMADOL HCL 50 MG PO TABS: 50.0000 mg | ORAL_TABLET | Freq: Four times a day (QID) | ORAL | 0 refills | Status: DC | PRN

## 2020-01-27 NOTE — ED Triage Notes (Signed)
Pt reports hx osteoarthritis in bilateral knees. Reports two days ago right wrist/hand became painful and inflamed. Reports this is comparable to labor pain.

## 2020-01-27 NOTE — Discharge Instructions (Addendum)
Please wear wrist brace to immobilize wrist Ice wrist Prednisone 20 mg daily for 5 days Tramadol for severe pain; may cause drowsiness, do not drive/work after taking  Follow up if not improving or worsening

## 2020-01-28 NOTE — ED Provider Notes (Signed)
[place]    CSN: [id number] Arrival date & time: 01/27/20  1445      History   Chief Complaint Chief Complaint  Patient presents with  . Wrist Pain    HPI Faith Gray is a 79 y.o. female history of arthritis, CAD, CHF, presenting today for evaluation of right wrist pain.  Patient notes that over the past 2 days she has developed increased pain in her right wrist.  Feels the pain deep in the bone.  She denies any injury fall or trauma.  Denies any significant increase in activity or heavy lifting.  She is right-handed.  Has become difficult to do her ADLs due to pain.  She has been using Tylenol without relief.  She is on Coumadin.  Has history of CHF, denies any current shortness of breath or leg swelling. Denies associated paresthesias extending into fingers.  HPI  Past Medical History:  Diagnosis Date  . Anemia   . Arthritis   . CAD (coronary artery disease) 09/11/2012  . CHF (congestive heart failure) ([place])   . Dental caries   . Essential hypertension 10/04/2012  . Ischemic cardiomyopathy   . NSTEMI (non-ST elevated myocardial infarction) ([place]) 09/11/2012  . Shortness of breath     Patient Active Problem List   Diagnosis Date Noted  . Syncope 02/24/2013  . Essential hypertension 10/04/2012  . Chronic systolic heart failure ([place]) 09/17/2012  . Aneurysm of left ventricle of heart 09/16/2012  . Long term (current) use of anticoagulants 09/16/2012  . CAD (coronary artery disease) 09/11/2012  . NSTEMI (non-ST elevated myocardial infarction) ([place]) 09/11/2012  . Acute systolic heart failure ([place]) 09/09/2012  . Chest tightness 09/09/2012  . CHF exacerbation ([place]) 09/07/2012  . SOB (shortness of breath) 09/07/2012  . Chest pain 09/07/2012  . EKG abnormalities 09/07/2012  . Normochromic anemia 09/07/2012    Past Surgical History:  Procedure Laterality Date  . CARDIAC CATHETERIZATION    . LEFT AND RIGHT HEART CATHETERIZATION WITH CORONARY ANGIOGRAM N/A  09/10/2012   Procedure: LEFT AND RIGHT HEART CATHETERIZATION WITH CORONARY ANGIOGRAM;  Surgeon: Jolaine Artist, MD;  Location: [facility] CATH LAB;  Service: Cardiovascular;  Laterality: N/A;  . MULTIPLE EXTRACTIONS WITH ALVEOLOPLASTY N/A 02/16/2017   Procedure: MULTIPLE EXTRACTION WITH ALVEOLOPLASTY;  Surgeon: Diona Browner, DDS;  Location: [place];  Service: Oral Surgery;  Laterality: N/A;    OB History   No obstetric history on file.      Home Medications    Prior to Admission medications   Medication Sig Start Date End Date Taking? Authorizing Provider  atorvastatin (LIPITOR) 80 MG tablet TAKE 1 TABLET BY MOUTH DAILY AT 6PM 05/19/19   Bensimhon, Shaune Pascal, MD  carvedilol (COREG) 12.5 MG tablet Take 1 tablet (12.5 mg total) by mouth 2 (two) times daily with a meal. 10/07/19   Bensimhon, Shaune Pascal, MD  ENTRESTO 49-51 MG TAKE 1 TABLET BY MOUTH TWICE A DAY 09/08/19   Bensimhon, Shaune Pascal, MD  furosemide (LASIX) 40 MG tablet Take 1 tablet (40 mg total) by mouth as needed. 02/05/18   Bensimhon, Shaune Pascal, MD  hydrALAZINE (APRESOLINE) 50 MG tablet Take 1.5 tablets (75 mg total) by mouth 3 (three) times daily. Needs appt for further refills 01/08/20   Bensimhon, Shaune Pascal, MD  isosorbide mononitrate (IMDUR) 60 MG 24 hr tablet TAKE 1 TABLET EVERY DAY ( DOSE INCREASE ) 10/07/19   Bensimhon, Shaune Pascal, MD  ondansetron (ZOFRAN) 4 MG tablet Take 1 tablet (4 mg total)  by mouth every 8 (eight) hours as needed for nausea or vomiting. 02/16/17   Diona Browner, DDS  oxyCODONE-acetaminophen (PERCOCET) 5-325 MG tablet Take 1-2 tablets by mouth every 4 (four) hours as needed. 02/16/17   Diona Browner, DDS  polyethylene glycol powder (GLYCOLAX/MIRALAX) powder Take 17 g by mouth daily. Patient taking differently: Take 17 g by mouth daily as needed for moderate constipation.  10/22/13   Janne Napoleon, NP  predniSONE (DELTASONE) 20 MG tablet Take 1 tablet (20 mg total) by mouth daily with breakfast for 5 days. 01/27/20 02/01/20  Meia Emley,  Halea Lieb C, PA-C  traMADol (ULTRAM) 50 MG tablet Take 1 tablet (50 mg total) by mouth every 6 (six) hours as needed for severe pain. 01/27/20   Keelia Graybill C, PA-C  warfarin (COUMADIN) 6 MG tablet TAKE 1 TABLET EVERY DAY EXCEPT TAKE 1/2 TABLET ON THURSDAYS OR AS DIRECTED BY COUMADIN CLINIC 01/14/20   Bensimhon, Shaune Pascal, MD    Family History Family History  Problem Relation Age of Onset  . Sickle cell anemia Brother     Social History Social History   Tobacco Use  . Smoking status: Former Smoker    Packs/day: 0.50    Years: 20.00    Pack years: 10.00    Types: Cigarettes  . Smokeless tobacco: Never Used  Substance Use Topics  . Alcohol use: No  . Drug use: No     Allergies   Vicodin [hydrocodone-acetaminophen]   Review of Systems Review of Systems  Constitutional: Negative for fatigue and fever.  Eyes: Negative for visual disturbance.  Respiratory: Negative for shortness of breath.   Cardiovascular: Negative for chest pain.  Gastrointestinal: Negative for abdominal pain, nausea and vomiting.  Musculoskeletal: Positive for arthralgias and joint swelling.  Skin: Negative for color change, rash and wound.  Neurological: Negative for dizziness, weakness, light-headedness and headaches.     Physical Exam Triage Vital Signs ED Triage Vitals  Enc Vitals Group     BP 01/27/20 1544 (!) 174/85     Pulse Rate 01/27/20 1544 93     Resp 01/27/20 1544 20     Temp 01/27/20 1544 98.2 F (36.8 C)     Temp Source 01/27/20 1544 Oral     SpO2 01/27/20 1544 100 %     Weight --      Height --      Head Circumference --      Peak Flow --      Pain Score 01/27/20 1543 10     Pain Loc --      Pain Edu? --      Excl. in [place]? --    No data found.  Updated Vital Signs BP (!) 174/85 (BP Location: Left Arm)   Pulse 93   Temp 98.2 F (36.8 C) (Oral)   Resp 20   SpO2 100%   Visual Acuity Right Eye Distance:   Left Eye Distance:   Bilateral Distance:    Right Eye Near:    Left Eye Near:    Bilateral Near:     Physical Exam Vitals and nursing note reviewed.  Constitutional:      Appearance: She is well-developed.     Comments: No acute distress  HENT:     Head: Normocephalic and atraumatic.     Nose: Nose normal.  Eyes:     Conjunctiva/sclera: Conjunctivae normal.  Cardiovascular:     Rate and Rhythm: Normal rate.  Pulmonary:     Effort: Pulmonary effort is  normal. No respiratory distress.  Abdominal:     General: There is no distension.  Musculoskeletal:        General: Normal range of motion.     Cervical back: Neck supple.     Comments: Right wrist: Mild swelling noted to wrist, no overlying erythema or warmth, no deformity noted, tenderness to palpation of distal radius and ulna extending into carpal area, less tender distally over metacarpals, full active range of motion of fingers although does elicit some discomfort, increased pain with Finkelstein's, negative Tinel's  Right elbow: Nontender to palpation of elbow, full active range of motion  Skin:    General: Skin is warm and dry.  Neurological:     Mental Status: She is alert and oriented to person, place, and time.      UC Treatments / Results  Labs (all labs ordered are listed, but only abnormal results are displayed) Labs Reviewed - No data to display  EKG   Radiology No results found.  Procedures Procedures (including critical care time)  Medications Ordered in UC Medications - No data to display  Initial Impression / Assessment and Plan / UC Course  I have reviewed the triage vital signs and the nursing notes.  Pertinent labs & imaging results that were available during my care of the patient were reviewed by me and considered in my medical decision making (see chart for details).     No mechanism of injury, no overlying warmth or erythema, do not suspect acute bony abnormality, do not suspect gout or cellulitis.  Suspect most likely inflammatory related to  arthritis versus tendinitis versus tenosynovitis.  Placing in thumb spica, placing on low-dose prednisone 20 mg x 5 days, discussed concern with patient in setting of CHF and advised to monitor her breathing and swelling.  Given she cannot take NSAIDs due to Coumadin use feel this is the safest option.  Providing a few days of tramadol for more severe pain.  May continue to use Voltaren topically.  Discussed strict return precautions. Patient verbalized understanding and is agreeable with plan.  Final Clinical Impressions(s) / UC Diagnoses   Final diagnoses:  Right wrist pain     Discharge Instructions     Please wear wrist brace to immobilize wrist Ice wrist Prednisone 20 mg daily for 5 days Tramadol for severe pain; may cause drowsiness, do not drive/work after taking  Follow up if not improving or worsening   ED Prescriptions    Medication Sig Dispense Auth. Provider   predniSONE (DELTASONE) 20 MG tablet Take 1 tablet (20 mg total) by mouth daily with breakfast for 5 days. 5 tablet Gerhart Ruggieri C, PA-C   traMADol (ULTRAM) 50 MG tablet Take 1 tablet (50 mg total) by mouth every 6 (six) hours as needed for severe pain. 12 tablet Bensyn Bornemann, [place] C, PA-C     I have reviewed the PDMP during this encounter.   Janith Lima, [place] 01/28/20 [phone]

## 2020-02-06 ENCOUNTER — Ambulatory Visit (INDEPENDENT_AMBULATORY_CARE_PROVIDER_SITE_OTHER): Payer: Medicare Other | Admitting: *Deleted

## 2020-02-06 ENCOUNTER — Other Ambulatory Visit: Payer: Self-pay

## 2020-02-06 DIAGNOSIS — Z7901 Long term (current) use of anticoagulants: Secondary | ICD-10-CM

## 2020-02-06 DIAGNOSIS — I253 Aneurysm of heart: Secondary | ICD-10-CM

## 2020-02-06 DIAGNOSIS — Z5181 Encounter for therapeutic drug level monitoring: Secondary | ICD-10-CM

## 2020-02-06 LAB — POCT INR: INR: 1.9 — AB (ref 2.0–3.0)

## 2020-02-06 NOTE — Patient Instructions (Signed)
Description   Take 1.5 tablets today and then continue on same dosage 1 tablet every day except 1/2 tablet on Thursdays.  Recheck in 5 weeks. Call with any questions or concerns [phone]

## 2020-02-10 ENCOUNTER — Other Ambulatory Visit (HOSPITAL_COMMUNITY): Payer: Self-pay | Admitting: Internal Medicine

## 2020-03-06 ENCOUNTER — Other Ambulatory Visit (HOSPITAL_COMMUNITY): Payer: Self-pay | Admitting: Internal Medicine

## 2020-03-12 ENCOUNTER — Ambulatory Visit (INDEPENDENT_AMBULATORY_CARE_PROVIDER_SITE_OTHER): Payer: Medicare Other | Admitting: *Deleted

## 2020-03-12 ENCOUNTER — Other Ambulatory Visit: Payer: Self-pay

## 2020-03-12 ENCOUNTER — Other Ambulatory Visit: Payer: Self-pay | Admitting: Cardiovascular Disease

## 2020-03-12 DIAGNOSIS — Z7901 Long term (current) use of anticoagulants: Secondary | ICD-10-CM | POA: Diagnosis not present

## 2020-03-12 DIAGNOSIS — I253 Aneurysm of heart: Secondary | ICD-10-CM

## 2020-03-12 LAB — PROTIME-INR
INR: 6.1 (ref 0.9–1.2)
INR: 6.1 — AB (ref <=1.1)
Prothrombin Time: 64.2 s — ABNORMAL HIGH (ref 9.1–12.0)

## 2020-03-12 LAB — POCT INR: INR: 7 — AB (ref 2.0–3.0)

## 2020-03-12 NOTE — Patient Instructions (Signed)
Description   STAT lab resulted at 6.1; Called and spoke with pt and instructed pt to not take any Warfarin today, no Warfarin tomorrow, and No Warfarin Sunday, then resume taking 1 tablet every day except 1/2 tablet on Thursdays. Recheck in 1 week. Call with any questions or concerns [phone] Report to the ER with any bleeding, falls, or accidents.

## 2020-03-19 ENCOUNTER — Ambulatory Visit (INDEPENDENT_AMBULATORY_CARE_PROVIDER_SITE_OTHER): Payer: Medicare Other | Admitting: Pharmacist

## 2020-03-19 ENCOUNTER — Other Ambulatory Visit: Payer: Self-pay

## 2020-03-19 DIAGNOSIS — I253 Aneurysm of heart: Secondary | ICD-10-CM | POA: Diagnosis not present

## 2020-03-19 DIAGNOSIS — Z7901 Long term (current) use of anticoagulants: Secondary | ICD-10-CM | POA: Diagnosis not present

## 2020-03-19 LAB — POCT INR: INR: 1.6 — AB (ref 2.0–3.0)

## 2020-03-19 NOTE — Patient Instructions (Signed)
Take 1.5 tablets tonight, then resume taking 1 tablet every day except 1/2 tablet on Thursdays. Recheck in 1.5 week. Call with any questions or concerns [phone]

## 2020-03-24 ENCOUNTER — Other Ambulatory Visit (HOSPITAL_COMMUNITY): Payer: Self-pay

## 2020-03-24 MED ORDER — ENTRESTO 49-51 MG PO TABS: 1.0000 | ORAL_TABLET | Freq: Two times a day (BID) | ORAL | 0 refills | Status: DC

## 2020-03-29 ENCOUNTER — Other Ambulatory Visit (HOSPITAL_COMMUNITY): Payer: Self-pay | Admitting: *Deleted

## 2020-04-02 ENCOUNTER — Ambulatory Visit (INDEPENDENT_AMBULATORY_CARE_PROVIDER_SITE_OTHER): Payer: Medicare Other | Admitting: Pharmacist

## 2020-04-02 ENCOUNTER — Other Ambulatory Visit: Payer: Self-pay

## 2020-04-02 DIAGNOSIS — Z7901 Long term (current) use of anticoagulants: Secondary | ICD-10-CM | POA: Diagnosis not present

## 2020-04-02 DIAGNOSIS — I253 Aneurysm of heart: Secondary | ICD-10-CM | POA: Diagnosis not present

## 2020-04-02 LAB — POCT INR: INR: 2.5 (ref 2.0–3.0)

## 2020-04-02 NOTE — Patient Instructions (Signed)
Description   Resume taking 1 tablet every day except 1/2 tablet on Thursdays. Recheck in 4 weeks Call with any questions or concerns [phone]

## 2020-04-13 ENCOUNTER — Other Ambulatory Visit: Payer: Self-pay

## 2020-04-13 ENCOUNTER — Ambulatory Visit
Admission: EM | Admit: 2020-04-13 | Discharge: 2020-04-13 | Disposition: A | Discharge status: Home / Self Care | Payer: Medicare Other | Attending: Physician Assistant | Admitting: Physician Assistant

## 2020-04-13 DIAGNOSIS — R112 Nausea with vomiting, unspecified: Secondary | ICD-10-CM | POA: Diagnosis not present

## 2020-04-13 DIAGNOSIS — R531 Weakness: Secondary | ICD-10-CM

## 2020-04-13 DIAGNOSIS — R1013 Epigastric pain: Secondary | ICD-10-CM

## 2020-04-13 DIAGNOSIS — R630 Anorexia: Secondary | ICD-10-CM

## 2020-04-13 MED ORDER — FAMOTIDINE 20 MG PO TABS
20.0000 mg | ORAL_TABLET | Freq: Every day | ORAL | 0 refills | Status: DC
Start: 2020-04-13 — End: 2020-05-27

## 2020-04-13 NOTE — ED Provider Notes (Signed)
[facility]    CSN: [id number] Arrival date & time: 04/13/20  1920      History   Chief Complaint Chief Complaint  Patient presents with  . Weakness    HPI Faith Gray is a 79 y.o. female.   79 year old female with history of CAD, CHF, anemia, HTN comes in for continued weakness, intermittent epigastric pain, decreased appetite after nausea/vomiting 1 week ago. States at that time, had multiple episodes of vomiting for the 2 days and was unable to take tolerate oral intake. Had epigastric pain during that time, that has since improved, but still intermittent. Denies diarrhea. Denies hematemesis, hematochezia, melena. She is not tolerating oral intake, but decreased compared to normal. Has been fluid intake. Denies chest pain, shortness of breath, leg swelling, orthopnea. Denies fever, urinary changes.     Past Medical History:  Diagnosis Date  . Anemia   . Arthritis   . CAD (coronary artery disease) 09/11/2012  . CHF (congestive heart failure) ([place])   . Dental caries   . Essential hypertension 10/04/2012  . Ischemic cardiomyopathy   . NSTEMI (non-ST elevated myocardial infarction) ([place]) 09/11/2012  . Shortness of breath     Patient Active Problem List   Diagnosis Date Noted  . Syncope 02/24/2013  . Essential hypertension 10/04/2012  . Chronic systolic heart failure ([place]) 09/17/2012  . Aneurysm of left ventricle of heart 09/16/2012  . Long term (current) use of anticoagulants 09/16/2012  . CAD (coronary artery disease) 09/11/2012  . NSTEMI (non-ST elevated myocardial infarction) ([place]) 09/11/2012  . Acute systolic heart failure ([place]) 09/09/2012  . Chest tightness 09/09/2012  . CHF exacerbation ([place]) 09/07/2012  . SOB (shortness of breath) 09/07/2012  . Chest pain 09/07/2012  . EKG abnormalities 09/07/2012  . Normochromic anemia 09/07/2012    Past Surgical History:  Procedure Laterality Date  . CARDIAC CATHETERIZATION    . LEFT AND RIGHT HEART  CATHETERIZATION WITH CORONARY ANGIOGRAM N/A 09/10/2012   Procedure: LEFT AND RIGHT HEART CATHETERIZATION WITH CORONARY ANGIOGRAM;  Surgeon: Jolaine Artist, MD;  Location: [facility] CATH LAB;  Service: Cardiovascular;  Laterality: N/A;  . MULTIPLE EXTRACTIONS WITH ALVEOLOPLASTY N/A 02/16/2017   Procedure: MULTIPLE EXTRACTION WITH ALVEOLOPLASTY;  Surgeon: Diona Browner, DDS;  Location: [place];  Service: Oral Surgery;  Laterality: N/A;    OB History   No obstetric history on file.      Home Medications    Prior to Admission medications   Medication Sig Start Date End Date Taking? Authorizing Provider  atorvastatin (LIPITOR) 80 MG tablet TAKE 1 TABLET BY MOUTH DAILY AT 6PM 05/19/19   Bensimhon, Shaune Pascal, MD  carvedilol (COREG) 12.5 MG tablet Take 1 tablet (12.5 mg total) by mouth 2 (two) times daily with a meal. Needs appt for further refills 02/11/20   Bensimhon, Shaune Pascal, MD  famotidine (PEPCID) 20 MG tablet Take 1 tablet (20 mg total) by mouth daily. 04/13/20   Tasia Catchings, Tonika [place] V, PA-C  hydrALAZINE (APRESOLINE) 50 MG tablet Take 1.5 tablets (75 mg total) by mouth 3 (three) times daily. Must be seen in office for further refills 03/08/20   Bensimhon, Shaune Pascal, MD  isosorbide mononitrate (IMDUR) 60 MG 24 hr tablet TAKE 1 TABLET EVERY DAY ( DOSE INCREASE ) 10/07/19   Bensimhon, Shaune Pascal, MD  sacubitril-valsartan (ENTRESTO) 49-51 MG Take 1 tablet by mouth 2 (two) times daily. Needs appt for further refills 03/24/20   Bensimhon, Shaune Pascal, MD  warfarin (COUMADIN) 6 MG tablet TAKE  1 TABLET EVERY DAY EXCEPT TAKE 1/2 TABLET ON THURSDAYS OR AS DIRECTED BY COUMADIN CLINIC 01/14/20   Bensimhon, Shaune Pascal, MD  furosemide (LASIX) 40 MG tablet Take 1 tablet (40 mg total) by mouth as needed. 02/05/18 04/13/20  Bensimhon, Shaune Pascal, MD    Family History Family History  Problem Relation Age of Onset  . Sickle cell anemia Brother     Social History Social History   Tobacco Use  . Smoking status: Former Smoker    Packs/day:  0.50    Years: 20.00    Pack years: 10.00    Types: Cigarettes  . Smokeless tobacco: Never Used  Vaping Use  . Vaping Use: Never used  Substance Use Topics  . Alcohol use: No  . Drug use: No     Allergies   Vicodin [hydrocodone-acetaminophen]   Review of Systems Review of Systems  Reason unable to perform ROS: See HPI as above.     Physical Exam Triage Vital Signs ED Triage Vitals  Enc Vitals Group     BP 04/13/20 1929 (!) 137/79     Pulse Rate 04/13/20 1929 81     Resp 04/13/20 1929 20     Temp 04/13/20 1929 97.7 F (36.5 C)     Temp Source 04/13/20 1929 Oral     SpO2 04/13/20 1929 97 %     Weight --      Height --      Head Circumference --      Peak Flow --      Pain Score 04/13/20 1941 0     Pain Loc --      Pain Edu? --      Excl. in [place]? --    No data found.  Updated Vital Signs BP (!) 137/79 (BP Location: Right Arm)   Pulse 81   Temp 97.7 F (36.5 C) (Oral)   Resp 20   SpO2 97%   Physical Exam Constitutional:      General: She is not in acute distress.    Appearance: She is well-developed. She is not ill-appearing, toxic-appearing or diaphoretic.  HENT:     Head: Normocephalic and atraumatic.  Eyes:     Conjunctiva/sclera: Conjunctivae normal.     Pupils: Pupils are equal, round, and reactive to light.  Cardiovascular:     Rate and Rhythm: Normal rate and regular rhythm.  Pulmonary:     Effort: Pulmonary effort is normal. No respiratory distress.     Comments: LCTAB Abdominal:     General: Bowel sounds are normal.     Palpations: Abdomen is soft.     Tenderness: There is no abdominal tenderness. There is no right CVA tenderness, left CVA tenderness, guarding or rebound.  Musculoskeletal:     Cervical back: Normal range of motion and neck supple.     Right lower leg: No edema.     Left lower leg: No edema.  Skin:    General: Skin is warm and dry.  Neurological:     Mental Status: She is alert and oriented to person, place, and time.       Comments: Grossly intact without focal deficits.      UC Treatments / Results  Labs (all labs ordered are listed, but only abnormal results are displayed) Labs Reviewed - No data to display  EKG   Radiology No results found.  Procedures Procedures (including critical care time)  Medications Ordered in UC Medications - No data to display  Initial  Impression / Assessment and Plan / UC Course  I have reviewed the triage vital signs and the nursing notes.  Pertinent labs & imaging results that were available during my care of the patient were reviewed by me and considered in my medical decision making (see chart for details).    Stable vitals. Patient able to ambulate on own without difficulty. Lungs clear to auscultation bilaterally without adventitious lung sounds. No leg swelling. No focal deficits. Patient still with intermittent epigastric pain, this is not reproducible on exam. Hydrating well without signs of fluid overload. Discussed limited resources in clinic, but currently does not require ED visit. Will treat symptomatically with Pepcid, though will need INR check in 3 days. Otherwise to continue to monitor symptoms and follow-up with PCP if symptoms not improving. Return precautions given. Patient expresses understanding and agrees plan.  Final Clinical Impressions(s) / UC Diagnoses   Final diagnoses:  Intractable vomiting with nausea, unspecified vomiting type  Weakness  Decreased appetite  Epigastric pain    ED Prescriptions    Medication Sig Dispense Auth. Provider   famotidine (PEPCID) 20 MG tablet Take 1 tablet (20 mg total) by mouth daily. 7 tablet Ok Edwards, PA-C     PDMP not reviewed this encounter.   Ok Edwards, PA-C 04/13/20 2118

## 2020-04-13 NOTE — ED Triage Notes (Signed)
Pt states last Monday 07/19 she ate a sausage biscuit in the evening and vomit for 2 days. States has felt weak and no appetite since. States drinking lots of fluid.

## 2020-04-13 NOTE — Discharge Instructions (Signed)
No alarming signs on exam. You can start pepcid for stomach discomfort, if you do, please follow up with INR clinic in 3 days for recheck. Follow up with PCP if symptoms not improving. If worsening symptoms, go to the ED for further evaluation.

## 2020-05-07 ENCOUNTER — Ambulatory Visit (INDEPENDENT_AMBULATORY_CARE_PROVIDER_SITE_OTHER): Payer: Medicare Other | Admitting: *Deleted

## 2020-05-07 ENCOUNTER — Other Ambulatory Visit: Payer: Self-pay

## 2020-05-07 DIAGNOSIS — Z7901 Long term (current) use of anticoagulants: Secondary | ICD-10-CM

## 2020-05-07 DIAGNOSIS — I253 Aneurysm of heart: Secondary | ICD-10-CM

## 2020-05-07 LAB — POCT INR: INR: 2.4 (ref 2.0–3.0)

## 2020-05-07 NOTE — Patient Instructions (Signed)
Description   Continue taking Warfarin 1 tablet every day except 1/2 tablet on Thursdays. Recheck in 5 weeks Call with any questions or concerns [phone]

## 2020-05-13 ENCOUNTER — Other Ambulatory Visit (HOSPITAL_COMMUNITY): Payer: Self-pay | Admitting: Internal Medicine

## 2020-05-19 ENCOUNTER — Other Ambulatory Visit (HOSPITAL_COMMUNITY): Payer: Self-pay | Admitting: Internal Medicine

## 2020-05-27 ENCOUNTER — Other Ambulatory Visit: Payer: Self-pay

## 2020-05-27 ENCOUNTER — Encounter (HOSPITAL_COMMUNITY): Payer: Self-pay | Admitting: Internal Medicine

## 2020-05-27 ENCOUNTER — Ambulatory Visit (HOSPITAL_COMMUNITY)
Admission: RE | Admit: 2020-05-27 | Discharge: 2020-05-27 | Disposition: A | Discharge status: Home / Self Care | Payer: Medicare Other | Source: Ambulatory Visit | Attending: Internal Medicine | Admitting: Internal Medicine

## 2020-05-27 VITALS — BP 164/92 | HR 64 | Ht 60.0 in | Wt 107.0 lb

## 2020-05-27 DIAGNOSIS — I255 Ischemic cardiomyopathy: Secondary | ICD-10-CM | POA: Diagnosis not present

## 2020-05-27 DIAGNOSIS — I252 Old myocardial infarction: Secondary | ICD-10-CM | POA: Insufficient documentation

## 2020-05-27 DIAGNOSIS — I253 Aneurysm of heart: Secondary | ICD-10-CM | POA: Diagnosis not present

## 2020-05-27 DIAGNOSIS — Z7901 Long term (current) use of anticoagulants: Secondary | ICD-10-CM | POA: Insufficient documentation

## 2020-05-27 DIAGNOSIS — I5022 Chronic systolic (congestive) heart failure: Secondary | ICD-10-CM | POA: Diagnosis not present

## 2020-05-27 DIAGNOSIS — I2510 Atherosclerotic heart disease of native coronary artery without angina pectoris: Secondary | ICD-10-CM | POA: Insufficient documentation

## 2020-05-27 DIAGNOSIS — I110 Hypertensive heart disease with heart failure: Secondary | ICD-10-CM | POA: Diagnosis not present

## 2020-05-27 DIAGNOSIS — Z79899 Other long term (current) drug therapy: Secondary | ICD-10-CM | POA: Diagnosis not present

## 2020-05-27 LAB — BASIC METABOLIC PANEL
Anion gap: 11 (ref 5–15)
BUN: 14 mg/dL (ref 8–23)
CO2: 21 mmol/L — ABNORMAL LOW (ref 22–32)
Calcium: 9.4 mg/dL (ref 8.9–10.3)
Chloride: 104 mmol/L (ref 98–111)
Creatinine, Ser: 1.41 mg/dL — ABNORMAL HIGH (ref 0.44–1.00)
GFR calc Af Amer: 41 mL/min — ABNORMAL LOW (ref >60)
GFR calc non Af Amer: 36 mL/min — ABNORMAL LOW (ref >60)
Glucose, Bld: 99 mg/dL (ref 70–99)
Potassium: 4.3 mmol/L (ref 3.5–5.1)
Sodium: 136 mmol/L (ref 135–145)

## 2020-05-27 MED ORDER — ENTRESTO 97-103 MG PO TABS: 1.0000 | ORAL_TABLET | Freq: Two times a day (BID) | ORAL | 6 refills | Status: DC

## 2020-05-27 NOTE — Progress Notes (Signed)
ADVANCED HEART FAILURE CLINIC NOTE  Patient ID: Faith Gray, female   DOB: 10/17/1940, 79 y.o.   MRN: [id number]  HF MD: Dr Haroldine Laws.  PCP: [facility].   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    Admitted to [facility] 12/13 for new onset acute systolic heart failure. Echo showed EF 25-30% with diffuse hypokinesis. Mild MR.   Admitted to [facility] 02/24/13-02/25/13 for near syncope after taking hydralazine early and jumping up quickly. Work up negative. ECHO EF improved to 40-45%, Severe hypokinesis base inferolateral. Akinesis base/mid inferior wall. 30 day Event monitor placed 03/11/13. This showed no significant arrhythmias.  She returns for HF follow up. Overall feeling fine. Able to walk 7-8 blocks slowly. Denies PND/Orthopnea. Appetite ok. No fever or chills. Weight at home stable. Taking all medications. Rarely smoking.   Cardiac Studies   Echo 3/18: EF 30-35% large aneurysm at the base to mid inferior wall. Echo 02/01/18: EF 35% large aneurysm at the base to mid inferior wall.  Cath 08/2012:  Left main: Normal  LAD: Gives off 3 diagonals. Mild diffuse plaquing with 30-40% lesion in midsection. Otherwise normal.  LCX: Tiny ramus. Large OM-1 & OM-2. 30% lesion in ostial LCX. Otherwise mild plaque.  RCA: Dominant vessel with diffuse disease. Diffuse 30-40% through midsection. In distal RCA there is a ruptured plaque with about 60% stenosis. PDA moderate diffuse disease. In distal RCA just prior to take-off of PLs there is a very focal area of myocardial bridging with severe systolic compression.  LV-gram done in the RAO projection: Ejection fraction = 25-30%. There is a large aneurysm at the base to mid inferior wall. Anterior wall mild HK. No significant MR noted.   Review of systems complete and found to be negative  unless listed in HPI.    Past Medical History:  Diagnosis Date  . Anemia   . Arthritis   . CAD (coronary artery disease) 09/11/2012  . CHF (congestive heart failure) ([place])   . Dental caries   . Essential hypertension 10/04/2012  . Ischemic cardiomyopathy   . NSTEMI (non-ST elevated myocardial infarction) ([place]) 09/11/2012  . Shortness of breath    Current Outpatient Medications  Medication Sig Dispense Refill  . atorvastatin (LIPITOR) 80 MG tablet TAKE 1 TABLET BY MOUTH DAILY AT 6PM 90 tablet 1  . carvedilol (COREG) 12.5 MG tablet Take 1 tablet (12.5 mg total) by mouth 2 (two) times daily with a meal. Must keep pending appointment for further refills 60 tablet 0  . hydrALAZINE (APRESOLINE) 50 MG tablet TAKE 1 AND 1/2 TABLETS THREE TIMES DAILY (MUST BE SEEN IN OFFICE FOR FURTHER REFILLS) 405 tablet 0  . isosorbide mononitrate (IMDUR) 60 MG 24 hr tablet TAKE 1 TABLET EVERY DAY ( DOSE INCREASE ) 90 tablet 3  . sacubitril-valsartan (ENTRESTO) 49-51 MG Take 1 tablet by mouth 2 (two) times daily. Needs appt for further refills 180 tablet 0  . warfarin (COUMADIN) 6 MG tablet TAKE 1 TABLET EVERY DAY EXCEPT TAKE 1/2 TABLET ON THURSDAYS OR AS DIRECTED BY COUMADIN CLINIC 90 tablet 1   No current facility-administered medications for this encounter.   Allergies  Allergen Reactions  . Vicodin [Hydrocodone-Acetaminophen] Nausea And Vomiting    PHYSICAL EXAM: Vitals:   05/27/20 1429  BP: (!) 164/92  Pulse: 64  SpO2: 98%  Weight:  48.5 kg (107 lb)  Height: 5' (1.524 m)    Wt Readings from Last 3 Encounters:  05/27/20 48.5 kg (107 lb)  07/31/18 51.4 kg (113 lb 6.4 oz)  05/09/18 54.4 kg (120 lb)   General:  Well appearing. No resp difficulty HEENT: normal Neck: supple. no JVD. Carotids 2+ bilat; no bruits. No lymphadenopathy or thryomegaly appreciated. Cor: PMI nondisplaced. Regular rate & rhythm. No rubs, gallops or murmurs. Lungs: clear Abdomen: soft, nontender, nondistended. No  hepatosplenomegaly. No bruits or masses. Good bowel sounds. Extremities: no cyanosis, clubbing, rash, edema Neuro: alert & orientedx3, cranial nerves grossly intact. moves all 4 extremities w/o difficulty. Affect pleasant  ASSESSMENT & PLAN: 1) Chronic systolic HF:  -Ischemic cardiomyopathy. 2019:  EF ~35%  She has a large inferior wall aneurysm.  - NYHA II. Volume status stable on exam.   - Continue carvedilol 12.5 mg  - Increase entresto 97-103 mg twice a day. Check BMET today and in 7 days.  - Continue hydralazine 75 mg TID and imdur 60 mg daily.  -Repeat ECHO  2) CAD:  - No s/s ischemia.  - Not on ASA with warfarin use.  - Continue atorvastatin and b-blocker 3) LV aneurysm:   - Felt to be high-risk for embolic phenomenon -> Continue coumadin. Dosing per coumadin clinic - Denies bleeding.  4) HTN -Elevated. Increase entresto 97-103 twice a day.   5) Smoking Rarely smoking. Discussed cessation.   Check BMET today. Set  Up ECHO. Increase entresto 97-103 mg twice a day.   Darrick Grinder, NP  05/27/2020  Patient seen and examined with the above-signed Advanced Practice Provider and/or Housestaff. I personally reviewed laboratory data, imaging studies and relevant notes. I independently examined the patient and formulated the important aspects of the plan. I have edited the note to reflect any of my changes or salient points. I have personally discussed the plan with the patient and/or family.  Overall doing well NYHA II. Denies smoking but I suspect she is still smoking a bit - which she laughs off. No CP, edema, orthopnea or PND. BP remains high. Has not been vaccinated  General:  Thin. Elderly. No resp difficulty HEENT: normal Neck: supple. no JVD. Carotids 2+ bilat; no bruits. No lymphadenopathy or thryomegaly appreciated. Cor: PMI nondisplaced. Regular rate & rhythm. No rubs, gallops or murmurs. Lungs: clear Abdomen: soft, nontender, nondistended. No hepatosplenomegaly. No bruits or  masses. Good bowel sounds. Extremities: no cyanosis, clubbing, rash, edema Neuro: alert & orientedx3, cranial nerves grossly intact. moves all 4 extremities w/o difficulty. Affect pleasant  Stable NYHA II. Volume status ok. Due for repeat echo. Will increase Entresto due to HTN. Discussed smoking cessation and risks/benefits of vaccine.   [name], MD  3:20 PM

## 2020-05-27 NOTE — Patient Instructions (Addendum)
INCREASE Entresto 97/103 Twice Daily  Labs done today, your results will be available in MyChart, we will contact you for abnormal readings.  Your physician recommends that you return for labs in 1 week.  Please call our office in January to schedule your follow up appointment and echocardiogram  Your physician has requested that you have an echocardiogram. Echocardiography is a painless test that uses sound waves to create images of your heart. It provides your doctor with information about the size and shape of your heart and how well your heart's chambers and valves are working. This procedure takes approximately one hour. There are no restrictions for this procedure.  If you have any questions or concerns before your next appointment please send [place] a message through [place] or call our office at [phone].    TO LEAVE A MESSAGE FOR THE NURSE SELECT OPTION 2, PLEASE LEAVE A MESSAGE INCLUDING: . YOUR NAME . DATE OF BIRTH . CALL BACK NUMBER . REASON FOR CALL**this is important as we prioritize the call backs  [place] AS LONG AS YOU CALL BEFORE 4:00 PM  At the [facility], you and your health needs are our priority. As part of our continuing mission to provide you with exceptional heart care, we have created designated Provider Care Teams. These Care Teams include your primary Cardiologist (physician) and Advanced Practice Providers (APPs- Physician Assistants and Nurse Practitioners) who all work together to provide you with the care you need, when you need it.   You may see any of the following providers on your designated Care Team at your next follow up: [name] Dr [name] . Dr Loralie Champagne . Darrick Grinder, NP . Lyda Jester, PA . Audry Riles, PharmD   Please be sure to bring in all your medications bottles to every appointment.

## 2020-06-04 ENCOUNTER — Ambulatory Visit (HOSPITAL_COMMUNITY)
Admission: RE | Admit: 2020-06-04 | Discharge: 2020-06-04 | Disposition: A | Discharge status: Home / Self Care | Payer: Medicare Other | Source: Ambulatory Visit | Attending: Cardiology | Admitting: Cardiology

## 2020-06-04 ENCOUNTER — Other Ambulatory Visit: Payer: Self-pay

## 2020-06-04 DIAGNOSIS — I5022 Chronic systolic (congestive) heart failure: Secondary | ICD-10-CM | POA: Insufficient documentation

## 2020-06-04 LAB — BASIC METABOLIC PANEL
Anion gap: 10 (ref 5–15)
BUN: 13 mg/dL (ref 8–23)
CO2: 22 mmol/L (ref 22–32)
Calcium: 9.3 mg/dL (ref 8.9–10.3)
Chloride: 105 mmol/L (ref 98–111)
Creatinine, Ser: 1.38 mg/dL — ABNORMAL HIGH (ref 0.44–1.00)
GFR calc Af Amer: 42 mL/min — ABNORMAL LOW (ref >60)
GFR calc non Af Amer: 37 mL/min — ABNORMAL LOW (ref >60)
Glucose, Bld: 88 mg/dL (ref 70–99)
Potassium: 4 mmol/L (ref 3.5–5.1)
Sodium: 137 mmol/L (ref 135–145)

## 2020-06-09 ENCOUNTER — Other Ambulatory Visit (HOSPITAL_COMMUNITY): Payer: Self-pay | Admitting: Internal Medicine

## 2020-06-16 ENCOUNTER — Other Ambulatory Visit: Payer: Self-pay

## 2020-06-16 ENCOUNTER — Ambulatory Visit (INDEPENDENT_AMBULATORY_CARE_PROVIDER_SITE_OTHER): Payer: Medicare Other | Admitting: *Deleted

## 2020-06-16 DIAGNOSIS — Z7901 Long term (current) use of anticoagulants: Secondary | ICD-10-CM | POA: Diagnosis not present

## 2020-06-16 DIAGNOSIS — I253 Aneurysm of heart: Secondary | ICD-10-CM | POA: Diagnosis not present

## 2020-06-16 LAB — POCT INR: INR: 3.1 — AB (ref 2.0–3.0)

## 2020-06-16 NOTE — Patient Instructions (Signed)
Description   Today take 1/2 tablet then continue taking Warfarin 1 tablet every day except 1/2 tablet on Thursdays. Recheck in 5 weeks. Call with any questions or concerns [phone]

## 2020-06-17 ENCOUNTER — Other Ambulatory Visit (HOSPITAL_COMMUNITY): Payer: Self-pay | Admitting: Internal Medicine

## 2020-06-17 DIAGNOSIS — I253 Aneurysm of heart: Secondary | ICD-10-CM

## 2020-06-21 ENCOUNTER — Other Ambulatory Visit (HOSPITAL_COMMUNITY): Payer: Self-pay | Admitting: Internal Medicine

## 2020-06-22 ENCOUNTER — Telehealth (HOSPITAL_COMMUNITY): Payer: Self-pay | Admitting: Pharmacist

## 2020-06-22 MED ORDER — ENTRESTO 97-103 MG PO TABS: 1.0000 | ORAL_TABLET | Freq: Two times a day (BID) | ORAL | 6 refills | Status: DC

## 2020-06-22 NOTE — Telephone Encounter (Signed)
Sent updated Entresto prescription to [facility] per patient request.   Audry Riles, PharmD, BCPS, BCCP, CPP Heart Failure Clinic Pharmacist [phone]

## 2020-07-02 ENCOUNTER — Other Ambulatory Visit (HOSPITAL_COMMUNITY): Payer: Self-pay | Admitting: Internal Medicine

## 2020-07-23 ENCOUNTER — Other Ambulatory Visit: Payer: Self-pay

## 2020-07-23 ENCOUNTER — Ambulatory Visit (INDEPENDENT_AMBULATORY_CARE_PROVIDER_SITE_OTHER): Payer: Medicare Other | Admitting: Pharmacist

## 2020-07-23 DIAGNOSIS — I253 Aneurysm of heart: Secondary | ICD-10-CM | POA: Diagnosis not present

## 2020-07-23 DIAGNOSIS — Z7901 Long term (current) use of anticoagulants: Secondary | ICD-10-CM | POA: Diagnosis not present

## 2020-07-23 LAB — POCT INR: INR: 2.6 (ref 2.0–3.0)

## 2020-07-23 NOTE — Patient Instructions (Signed)
Continue taking Warfarin 1 tablet every day except 1/2 tablet on Thursdays. Recheck in 6 weeks. Call with any questions or concerns [phone]

## 2020-07-27 ENCOUNTER — Other Ambulatory Visit (HOSPITAL_COMMUNITY): Payer: Self-pay | Admitting: Internal Medicine

## 2020-08-18 ENCOUNTER — Other Ambulatory Visit (HOSPITAL_COMMUNITY): Payer: Self-pay | Admitting: Internal Medicine

## 2020-09-02 ENCOUNTER — Other Ambulatory Visit (HOSPITAL_COMMUNITY): Payer: Self-pay | Admitting: Internal Medicine

## 2020-09-03 ENCOUNTER — Other Ambulatory Visit: Payer: Self-pay

## 2020-09-03 ENCOUNTER — Ambulatory Visit (INDEPENDENT_AMBULATORY_CARE_PROVIDER_SITE_OTHER): Payer: Medicare Other

## 2020-09-03 DIAGNOSIS — I253 Aneurysm of heart: Secondary | ICD-10-CM | POA: Diagnosis not present

## 2020-09-03 DIAGNOSIS — Z7901 Long term (current) use of anticoagulants: Secondary | ICD-10-CM | POA: Diagnosis not present

## 2020-09-03 LAB — POCT INR: INR: 3.1 — AB (ref 2.0–3.0)

## 2020-09-03 NOTE — Patient Instructions (Signed)
Description   Take 1/2 tablet today, then resume same dosage 1 tablet every day except 1/2 tablet on Thursdays. Recheck in 5 weeks. Call with any questions or concerns [phone]

## 2020-09-06 DIAGNOSIS — R319 Hematuria, unspecified: Secondary | ICD-10-CM | POA: Diagnosis not present

## 2020-09-06 DIAGNOSIS — N898 Other specified noninflammatory disorders of vagina: Secondary | ICD-10-CM | POA: Diagnosis not present

## 2020-09-23 NOTE — Progress Notes (Incomplete)
ADVANCED HEART FAILURE CLINIC NOTE  Patient ID: Faith Gray, female   DOB: 1940-11-20, 80 y.o.   MRN: [id number]  HF MD: Dr Gala Romney.  PCP: [facility].   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    Admitted to [facility] 12/13 for new onset acute systolic heart failure. Echo showed EF 25-30% with diffuse hypokinesis. Mild MR.   Admitted to [facility] 6/14 for near syncope after taking hydralazine early and jumping up quickly. Work up negative. ECHO EF improved to 40-45%, Severe hypokinesis base inferolateral. Akinesis base/mid inferior wall. 30 day Event monitor placed 614. This showed no significant arrhythmias.  We last saw her 9/21. Repeat echo ordered but not completed  She returns for HF follow up. Overall feeling fine. Able to walk 7-8 blocks slowly. Denies PND/Orthopnea. Appetite ok. No fever or chills. Weight at home stable. Taking all medications. Rarely smoking.   Cardiac Studies   Echo 3/18: EF 30-35% large aneurysm at the base to mid inferior wall. Echo 02/01/18: EF 35% large aneurysm at the base to mid inferior wall.  Cath 08/2012:  Left main: Normal  LAD: Gives off 3 diagonals. Mild diffuse plaquing with 30-40% lesion in midsection. Otherwise normal.  LCX: Tiny ramus. Large OM-1 & OM-2. 30% lesion in ostial LCX. Otherwise mild plaque.  RCA: Dominant vessel with diffuse disease. Diffuse 30-40% through midsection. In distal RCA there is a ruptured plaque with about 60% stenosis. PDA moderate diffuse disease. In distal RCA just prior to take-off of PLs there is a very focal area of myocardial bridging with severe systolic compression.  LV-gram done in the RAO projection: Ejection fraction = 25-30%. There is a large aneurysm at the base to mid inferior wall. Anterior wall mild HK. No significant MR  noted.   Review of systems complete and found to be negative unless listed in HPI.    Past Medical History:  Diagnosis Date  . Anemia   . Arthritis   . CAD (coronary artery disease) 09/11/2012  . CHF (congestive heart failure) ([facility])   . Dental caries   . Essential hypertension 10/04/2012  . Ischemic cardiomyopathy   . NSTEMI (non-ST elevated myocardial infarction) ([facility]) 09/11/2012  . Shortness of breath    Current Outpatient Medications  Medication Sig Dispense Refill  . atorvastatin (LIPITOR) 80 MG tablet TAKE 1 TABLET BY MOUTH DAILY AT 6PM 90 tablet 1  . carvedilol (COREG) 12.5 MG tablet Take 1 tablet (12.5 mg total) by mouth 2 (two) times daily with a meal. 60 tablet 11  . hydrALAZINE (APRESOLINE) 50 MG tablet TAKE 1 AND 1/2 TABLETS THREE TIMES DAILY (MUST BE SEEN IN OFFICE FOR FURTHER REFILLS) 405 tablet 0  . isosorbide mononitrate (IMDUR) 60 MG 24 hr tablet TAKE 1 TABLET EVERY DAY 90 tablet 3  . sacubitril-valsartan (ENTRESTO) 97-103 MG Take 1 tablet by mouth 2 (two) times daily. 60 tablet 6  . warfarin (COUMADIN) 6 MG tablet TAKE 1 TABLET EVERY DAY EXCEPT TAKE 1/2 TABLET ON THURSDAYS OR AS DIRECTED BY COUMADIN CLINIC 90 tablet 1   No current facility-administered medications for this encounter.   Allergies  Allergen Reactions  . Vicodin [Hydrocodone-Acetaminophen] Nausea And Vomiting    PHYSICAL EXAM: There were no vitals filed for this visit.  Wt Readings from Last 3 Encounters:  05/27/20 48.5 kg (107 lb)  07/31/18 51.4 kg (113 lb 6.4 oz)  05/09/18 54.4 kg (120 lb)   General:  Well appearing. No resp difficulty HEENT: normal Neck: supple. no JVD. Carotids 2+ bilat; no bruits. No lymphadenopathy or thryomegaly appreciated. Cor: PMI nondisplaced. Regular rate & rhythm. No rubs, gallops or murmurs. Lungs: clear Abdomen: soft, nontender, nondistended. No hepatosplenomegaly. No bruits or masses. Good bowel sounds. Extremities: no cyanosis, clubbing, rash, edema Neuro:  alert & orientedx3, cranial nerves grossly intact. moves all 4 extremities w/o difficulty. Affect pleasant  ASSESSMENT & PLAN:  1) Chronic systolic HF:  -Ischemic cardiomyopathy. 2019:  EF ~35%  She has a large inferior wall aneurysm.  - NYHA II. Volume status stable on exam.   - Continue carvedilol 12.5 mg  - Increase entresto 97-103 mg twice a day. Check BMET today and in 7 days.  - Continue hydralazine 75 mg TID and imdur 60 mg daily.  -Repeat ECHO  2) CAD:  - No s/s ischemia.  - Not on ASA with warfarin use.  - Continue atorvastatin and b-blocker 3) LV aneurysm:   - Felt to be high-risk for embolic phenomenon -> Continue coumadin. Dosing per coumadin clinic - Denies bleeding.  4) HTN -Elevated. Increase entresto 97-103 twice a day.   5) Smoking Rarely smoking. Discussed cessation.   Check BMET today. Set  Up ECHO. Increase entresto 97-103 mg twice a day.   [name], MD  09/23/2020

## 2020-09-24 ENCOUNTER — Inpatient Hospital Stay (HOSPITAL_COMMUNITY): Admission: RE | Admit: 2020-09-24 | Payer: Medicare Other | Source: Ambulatory Visit | Admitting: Internal Medicine

## 2020-09-24 ENCOUNTER — Ambulatory Visit (HOSPITAL_COMMUNITY): Payer: Medicare Other

## 2020-10-04 ENCOUNTER — Other Ambulatory Visit (HOSPITAL_COMMUNITY): Payer: Self-pay | Admitting: Internal Medicine

## 2020-10-05 MED ORDER — ENTRESTO 97-103 MG PO TABS: 1.0000 | ORAL_TABLET | Freq: Two times a day (BID) | ORAL | 6 refills | Status: DC

## 2020-10-08 ENCOUNTER — Ambulatory Visit (INDEPENDENT_AMBULATORY_CARE_PROVIDER_SITE_OTHER): Payer: Medicare Other | Admitting: *Deleted

## 2020-10-08 ENCOUNTER — Other Ambulatory Visit: Payer: Self-pay

## 2020-10-08 DIAGNOSIS — Z7901 Long term (current) use of anticoagulants: Secondary | ICD-10-CM | POA: Diagnosis not present

## 2020-10-08 DIAGNOSIS — I253 Aneurysm of heart: Secondary | ICD-10-CM | POA: Diagnosis not present

## 2020-10-08 LAB — POCT INR: INR: 3.7 — AB (ref 2.0–3.0)

## 2020-10-08 NOTE — Patient Instructions (Signed)
Description   Do not take any Warfarin today then start taking 1 tablet every day except 1/2 tablet on Sundays and Thursdays. Recheck in 4 weeks. Call with any questions or concerns [phone]

## 2020-10-31 NOTE — Progress Notes (Signed)
ADVANCED HEART FAILURE CLINIC NOTE  Patient ID: Faith Gray, female   DOB: 1940/10/12, 80 y.o.   MRN: [id number]  HF MD: Dr Haroldine Laws.  PCP: [facility] [place].   HPI: Ms. 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    Admitted to [facility] 12/13 for new onset acute systolic HF. Echo showed EF 25-30% with diffuse hypokinesis. Mild MR.   Admitted to [facility] 6/14 for near syncope after taking hydralazine early and jumping up quickly. Work up negative. ECHO EF improved to 40-45%, Severe hypokinesis base inferolateral. Akinesis base/mid inferior wall. 30 day Event monitor placed 03/11/13. This showed no significant arrhythmias.  She returns for HF follow up. Says she feels pretty good. Taking BP 4-5x/week. Still walking 15 mins about 4x/week. Mild SOB. No change. No CP. No edema, orthopnea or PND. Still smoking a couple cigarettes here and there. No problems with meds.   Echo today 11/01/20: EF 30-35%  large aneurysm at the base to mid inferior wall. Mild to moderate MR. Personally reviewed  Cardiac Studies   Echo 3/18: EF 30-35% large aneurysm at the base to mid inferior wall. Echo 02/01/18: EF 35% large aneurysm at the base to mid inferior wall.  Cath 08/2012:  Left main: Normal  LAD: Gives off 3 diagonals. Mild diffuse plaquing with 30-40% lesion in midsection. Otherwise normal.  LCX: Tiny ramus. Large OM-1 & OM-2. 30% lesion in ostial LCX. Otherwise mild plaque.  RCA: Dominant vessel with diffuse disease. Diffuse 30-40% through midsection. In distal RCA there is a ruptured plaque with about 60% stenosis. PDA moderate diffuse disease. In distal RCA just prior to take-off of PLs there is a very focal area of myocardial bridging with severe systolic compression.  LV-gram done in the RAO projection: Ejection fraction = 25-30%. There is a large  aneurysm at the base to mid inferior wall. Anterior wall mild HK. No significant MR noted.   Review of systems complete and found to be negative unless listed in HPI.    Past Medical History:  Diagnosis Date  . Anemia   . Arthritis   . CAD (coronary artery disease) 09/11/2012  . CHF (congestive heart failure) ([place])   . Dental caries   . Essential hypertension 10/04/2012  . Ischemic cardiomyopathy   . NSTEMI (non-ST elevated myocardial infarction) ([place]) 09/11/2012  . Shortness of breath    Current Outpatient Medications  Medication Sig Dispense Refill  . atorvastatin (LIPITOR) 80 MG tablet TAKE 1 TABLET BY MOUTH DAILY AT 6PM 90 tablet 1  . carvedilol (COREG) 12.5 MG tablet Take 1 tablet (12.5 mg total) by mouth 2 (two) times daily with a meal. 60 tablet 11  . hydrALAZINE (APRESOLINE) 50 MG tablet TAKE 1 AND 1/2 TABLETS THREE TIMES DAILY (MUST BE SEEN IN OFFICE FOR FURTHER REFILLS) 405 tablet 0  . isosorbide mononitrate (IMDUR) 60 MG 24 hr tablet TAKE 1 TABLET EVERY DAY 90 tablet 3  . sacubitril-valsartan (ENTRESTO) 97-103 MG Take 1 tablet by mouth 2 (two) times daily. 60 tablet 6  . warfarin (COUMADIN) 6 MG tablet TAKE 1 TABLET EVERY DAY EXCEPT TAKE 1/2 TABLET ON THURSDAYS OR AS DIRECTED BY COUMADIN CLINIC 90 tablet 1   No current facility-administered medications for this encounter.   Allergies  Allergen Reactions  . Vicodin [Hydrocodone-Acetaminophen] Nausea And Vomiting    PHYSICAL EXAM: Vitals:  11/01/20 1503  BP: (!) 160/90  Pulse: (!) 53  SpO2: 100%  Weight: 51.2 kg (112 lb 12.8 oz)    Wt Readings from Last 3 Encounters:  05/27/20 48.5 kg (107 lb)  07/31/18 51.4 kg (113 lb 6.4 oz)  05/09/18 54.4 kg (120 lb)   General:  Well appearing. No resp difficulty HEENT: normal Neck: supple. no JVD. Carotids 2+ bilat; no bruits. No lymphadenopathy or thryomegaly appreciated. Cor: PMI nondisplaced. Regular rate & rhythm. 2/6 MR Lungs: clear Abdomen: soft, nontender,  nondistended. No hepatosplenomegaly. No bruits or masses. Good bowel sounds. Extremities: no cyanosis, clubbing, rash, edema Neuro: alert & orientedx3, cranial nerves grossly intact. moves all 4 extremities w/o difficulty. Affect pleasant  1) Chronic systolic HF:  -Ischemic cardiomyopathy. 2019:  EF ~35%  She has a large inferior wall aneurysm.  - Echo today 11/01/20: EF 30-35%  large aneurysm at the base to mid inferior wall. Mild to moderate MR. Personally reviewed - Chronic NYHA II. Volume status stable on exam.   - Continue carvedilol 12.5 mg HR too low to titrate - Continue Entresto 97/103 - Continue hydralazine 75 mg TID and imdur 60 mg daily.  - Add spiro 12.5  - Eventual SGLT2i - Labs today and 1 week.  - Wants to defer ICD decision for now   2) CAD:  - No s/s of angina  - Not on ASA with warfarin use.  - Continue atorvastatin and b-blocker  3) LV aneurysm:   - Felt to be high-risk for embolic phenomenon -> Continue coumadin. Dosing per coumadin clinic - No bleeding. CBC today  4) HTN -Elevated. Will add spiro. Can increase hydralazine as needed. Also can add amlodipine. - Will have her f/u in [facility] q2 weeks x 3 visits to titrate meds.   5) Smoking Rarely smoking. Discussed cessation.   Faith Bickers, MD  10/31/2020

## 2020-11-01 ENCOUNTER — Ambulatory Visit (HOSPITAL_BASED_OUTPATIENT_CLINIC_OR_DEPARTMENT_OTHER)
Admission: RE | Admit: 2020-11-01 | Discharge: 2020-11-01 | Disposition: A | Discharge status: Home / Self Care | Payer: Medicare Other | Source: Ambulatory Visit | Attending: Internal Medicine | Admitting: Internal Medicine

## 2020-11-01 ENCOUNTER — Other Ambulatory Visit: Payer: Self-pay

## 2020-11-01 ENCOUNTER — Encounter (HOSPITAL_COMMUNITY): Payer: Self-pay | Admitting: Internal Medicine

## 2020-11-01 ENCOUNTER — Ambulatory Visit (HOSPITAL_COMMUNITY)
Admission: RE | Admit: 2020-11-01 | Discharge: 2020-11-01 | Disposition: A | Discharge status: Home / Self Care | Payer: Medicare Other | Source: Ambulatory Visit | Attending: Internal Medicine | Admitting: Internal Medicine

## 2020-11-01 VITALS — BP 160/90 | HR 53 | Wt 112.8 lb

## 2020-11-01 DIAGNOSIS — I255 Ischemic cardiomyopathy: Secondary | ICD-10-CM | POA: Insufficient documentation

## 2020-11-01 DIAGNOSIS — F17210 Nicotine dependence, cigarettes, uncomplicated: Secondary | ICD-10-CM | POA: Insufficient documentation

## 2020-11-01 DIAGNOSIS — I2510 Atherosclerotic heart disease of native coronary artery without angina pectoris: Secondary | ICD-10-CM | POA: Diagnosis not present

## 2020-11-01 DIAGNOSIS — I253 Aneurysm of heart: Secondary | ICD-10-CM | POA: Diagnosis not present

## 2020-11-01 DIAGNOSIS — I5022 Chronic systolic (congestive) heart failure: Secondary | ICD-10-CM

## 2020-11-01 DIAGNOSIS — I340 Nonrheumatic mitral (valve) insufficiency: Secondary | ICD-10-CM | POA: Diagnosis not present

## 2020-11-01 DIAGNOSIS — I509 Heart failure, unspecified: Secondary | ICD-10-CM | POA: Diagnosis not present

## 2020-11-01 DIAGNOSIS — I110 Hypertensive heart disease with heart failure: Secondary | ICD-10-CM | POA: Insufficient documentation

## 2020-11-01 DIAGNOSIS — I700 Atherosclerosis of aorta: Secondary | ICD-10-CM | POA: Insufficient documentation

## 2020-11-01 DIAGNOSIS — Z7901 Long term (current) use of anticoagulants: Secondary | ICD-10-CM | POA: Diagnosis not present

## 2020-11-01 DIAGNOSIS — I10 Essential (primary) hypertension: Secondary | ICD-10-CM

## 2020-11-01 LAB — PROTIME-INR
INR: 2 — ABNORMAL HIGH (ref 0.8–1.2)
Prothrombin Time: 21.8 s — ABNORMAL HIGH (ref 11.4–15.2)

## 2020-11-01 LAB — ECHOCARDIOGRAM COMPLETE
Area-P 1/2: 2.26 cm2
Calc EF: 34.8 %
MV M vel: 4.21 m/s
MV Peak grad: 70.9 mmHg
S' Lateral: 4.9 cm
Single Plane A2C EF: 33.2 %
Single Plane A4C EF: 32.6 %

## 2020-11-01 LAB — CBC
HCT: 28.3 % — ABNORMAL LOW (ref 36.0–46.0)
Hemoglobin: 9.9 g/dL — ABNORMAL LOW (ref 12.0–15.0)
MCH: 30.4 pg (ref 26.0–34.0)
MCHC: 35 g/dL (ref 30.0–36.0)
MCV: 86.8 fL (ref 80.0–100.0)
Platelets: 354 K/uL (ref 150–400)
RBC: 3.26 MIL/uL — ABNORMAL LOW (ref 3.87–5.11)
RDW: 14.5 % (ref 11.5–15.5)
WBC: 3.2 K/uL — ABNORMAL LOW (ref 4.0–10.5)
nRBC: 0 % (ref 0.0–0.2)

## 2020-11-01 LAB — BASIC METABOLIC PANEL
Anion gap: 10 (ref 5–15)
BUN: 14 mg/dL (ref 8–23)
CO2: 22 mmol/L (ref 22–32)
Calcium: 9.2 mg/dL (ref 8.9–10.3)
Chloride: 105 mmol/L (ref 98–111)
Creatinine, Ser: 1.38 mg/dL — ABNORMAL HIGH (ref 0.44–1.00)
GFR, Estimated: 39 mL/min — ABNORMAL LOW (ref >60)
Glucose, Bld: 94 mg/dL (ref 70–99)
Potassium: 4.6 mmol/L (ref 3.5–5.1)
Sodium: 137 mmol/L (ref 135–145)

## 2020-11-01 LAB — BRAIN NATRIURETIC PEPTIDE: B Natriuretic Peptide: 332.2 pg/mL — ABNORMAL HIGH (ref 0.0–100.0)

## 2020-11-01 MED ORDER — SPIRONOLACTONE 25 MG PO TABS
12.5000 mg | ORAL_TABLET | Freq: Every day | ORAL | 3 refills | Status: DC
Start: 2020-11-01 — End: 2020-11-22

## 2020-11-01 NOTE — Addendum Note (Signed)
Encounter addended by: Shonna Chock, CMA on: 0/76/1518 4:06 PM  Actions taken: Pharmacy for encounter modified, Order list changed, Diagnosis association updated, Charge Capture section accepted, Clinical Note Signed

## 2020-11-01 NOTE — Patient Instructions (Addendum)
Labs done today. We will contact you only if your labs are abnormal.  START Spironolactone 12.5mg  (1/2 tablet) by mouth daily.  No other medication changes were made. Please continue all current medications as prescribed.  Your physician recommends that you schedule a follow-up appointment in:  1-2 weeks for a lab only appointment, in 2-3 weeks for an appointment with our Clinic Pharmacist, Lauren and in 6 months with Dr. Haroldine Laws.    If you have any questions or concerns before your next appointment please send [place] a message through [place] or call our office at [phone].    TO LEAVE A MESSAGE FOR THE NURSE SELECT OPTION 2, PLEASE LEAVE A MESSAGE INCLUDING: . YOUR NAME . DATE OF BIRTH . CALL BACK NUMBER . REASON FOR CALL**this is important as we prioritize the call backs  YOU WILL RECEIVE A CALL BACK THE SAME DAY AS LONG AS YOU CALL BEFORE 4:00 PM   Do the following things EVERYDAY: 1) Weigh yourself in the morning before breakfast. Write it down and keep it in a log. 2) Take your medicines as prescribed 3) Eat low salt foods--Limit salt (sodium) to 2000 mg per day.  4) Stay as active as you can everyday 5) Limit all fluids for the day to less than 2 liters   At the [facility], you and your health needs are our priority. As part of our continuing mission to provide you with exceptional heart care, we have created designated Provider Care Teams. These Care Teams include your primary Cardiologist (physician) and Advanced Practice Providers (APPs- Physician Assistants and Nurse Practitioners) who all work together to provide you with the care you need, when you need it.   You may see any of the following providers on your designated Care Team at your next follow up: [name] Dr [name] . Dr Loralie Champagne . Darrick Grinder, NP . Lyda Jester, PA . Audry Riles, PharmD   Please be sure to bring in all your medications bottles to every appointment.

## 2020-11-01 NOTE — Progress Notes (Signed)
  Echocardiogram 2D Echocardiogram has been performed.  Michiel Cowboy 11/01/2020, 2:43 PM

## 2020-11-11 ENCOUNTER — Other Ambulatory Visit (HOSPITAL_COMMUNITY): Payer: Self-pay | Admitting: Internal Medicine

## 2020-11-11 DIAGNOSIS — I253 Aneurysm of heart: Secondary | ICD-10-CM

## 2020-11-12 ENCOUNTER — Other Ambulatory Visit: Payer: Self-pay

## 2020-11-12 ENCOUNTER — Ambulatory Visit (INDEPENDENT_AMBULATORY_CARE_PROVIDER_SITE_OTHER): Payer: Medicare Other | Admitting: *Deleted

## 2020-11-12 DIAGNOSIS — I253 Aneurysm of heart: Secondary | ICD-10-CM | POA: Diagnosis not present

## 2020-11-12 DIAGNOSIS — Z7901 Long term (current) use of anticoagulants: Secondary | ICD-10-CM

## 2020-11-12 DIAGNOSIS — Z5181 Encounter for therapeutic drug level monitoring: Secondary | ICD-10-CM

## 2020-11-12 LAB — POCT INR: INR: 1.7 — AB (ref 2.0–3.0)

## 2020-11-12 NOTE — Patient Instructions (Signed)
Description    Take warfarin 1.5 tablets today, then continue taking 1 tablet every day except 1/2 tablet on  Thursdays. Recheck in 3 weeks. Call with any questions or concerns [phone]

## 2020-11-15 ENCOUNTER — Other Ambulatory Visit (HOSPITAL_COMMUNITY): Payer: Medicare Other

## 2020-11-17 ENCOUNTER — Ambulatory Visit (HOSPITAL_COMMUNITY)
Admission: RE | Admit: 2020-11-17 | Discharge: 2020-11-17 | Disposition: A | Discharge status: Home / Self Care | Payer: Medicare Other | Source: Ambulatory Visit | Attending: Internal Medicine | Admitting: Internal Medicine

## 2020-11-17 ENCOUNTER — Other Ambulatory Visit: Payer: Self-pay

## 2020-11-17 DIAGNOSIS — I5022 Chronic systolic (congestive) heart failure: Secondary | ICD-10-CM | POA: Insufficient documentation

## 2020-11-17 LAB — BASIC METABOLIC PANEL
Anion gap: 9 (ref 5–15)
BUN: 14 mg/dL (ref 8–23)
CO2: 23 mmol/L (ref 22–32)
Calcium: 9.3 mg/dL (ref 8.9–10.3)
Chloride: 106 mmol/L (ref 98–111)
Creatinine, Ser: 1.44 mg/dL — ABNORMAL HIGH (ref 0.44–1.00)
GFR, Estimated: 37 mL/min — ABNORMAL LOW (ref >60)
Glucose, Bld: 103 mg/dL — ABNORMAL HIGH (ref 70–99)
Potassium: 4.4 mmol/L (ref 3.5–5.1)
Sodium: 138 mmol/L (ref 135–145)

## 2020-11-20 NOTE — Progress Notes (Incomplete)
***In Progress*** HF MD: Dr Haroldine Laws.  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    Admitted to [facility] 12/13 for new onset acute systolic HF. Echo showed EF 25-30% with diffuse hypokinesis. Mild MR.   Admitted to [facility] 6/14 for near syncope after taking hydralazine early and jumping up quickly. Work up negative. ECHO EF improved to 40-45%, Severe hypokinesis base inferolateral. Akinesis base/mid inferior wall. 30-day event monitor placed 03/11/13. This showed no significant arrhythmias.  She returned for HF follow up with Dr. Haroldine Laws on 11/01/20. She was feeling pretty good and had mild SOB. She was still walking 15 mins about 4x/week. No CP. No edema, orthopnea or PND. Was still smoking a couple cigarettes here and there. No problems with medications. Echo showed EF 30-35%, large aneurysm at the base to mid inferior wall. Mild to moderate MR. RV normal.  Today she returns to HF clinic for pharmacist medication titration. At last visit with MD, spironolactone 12.5 mg daily was started.   160/90, 53, 112 lbs - no bmet needed A) increase spironolactone 25, labs in 1 week VS Farxiga ([place]) - If BP way up, increase hydralazine 100 tid - add amlodipine if BP still stays up, no edema  F/u: 3 wk Pharm visit - do we have open slots ?  Overall feeling ***. Dizziness, lightheadedness, fatigue:  Chest pain or palpitations:  How is your breathing?: *** SOB: Able to complete all ADLs. Activity level ***  Weight at home pounds. Takes furosemide/torsemide/bumex *** mg *** daily.  LEE PND/Orthopnea  Appetite *** Low-salt diet:   Physical Exam Cost/affordability of meds   HF Medications: Carvedilol 12.5 mg BID Entresto 97/103 mg BID Spironolactone 12.5 mg daily Hydralazine 75 mg  TID Isosorbide mononitrate 60 mg daily  Has the patient been experiencing any side effects to the medications prescribed?  {YES NO:22349}  Does the patient have any problems obtaining medications due to transportation or finances?   {YES NO:22349} - [place] Medicaid and Medicare Part A/B  Understanding of regimen: {excellent/good/fair/poor:19665} Understanding of indications: {excellent/good/fair/poor:19665} Potential of compliance: {excellent/good/fair/poor:19665} Patient understands to avoid NSAIDs. Patient understands to avoid decongestants.    Pertinent Lab Values: . 11/17/20: Serum creatinine 1.44, BUN 14, Potassium 4.4, Sodium 138 . 11/01/20: BNP 332.2   Vital Signs: . Weight: *** (last clinic weight: 112 lbs) . Blood pressure: ***  . Heart rate: ***   Assessment/Plan: 1) Chronic systolic HF:  -Ischemic cardiomyopathy. 2019:  EF ~35%  She has a large inferior wall aneurysm.  - Echo 11/01/20: EF 30-35%  large aneurysm at the base to mid inferior wall. Mild to moderate MR.  - Chronic NYHA II. Volume status stable on exam.   - Continue carvedilol 12.5 mg BID. HR too low to titrate - Continue Entresto 97/103 mg BID - Continue spironolactone 12.5 mg daily - Continue hydralazine 75 mg TID  - Continue isosorbide mononitrate 60 mg daily.  - Eventual SGLT2i - Follow-up: (labs) - Previously noted that patient wants to defer ICD decision for now   2) CAD:  - No s/s of angina  - Not on ASA with warfarin use.  - Continue atorvastatin and carvedilol  3) LV aneurysm:   - Felt to be high-risk for embolic phenomenon - Continue warfarin. Dosing per warfarin clinic -  No bleeding.  4) HTN - Elevated.  - Continue spironolactone 12.5 mg daily - Consider increase hydralazine or adding amlodipine if BP remains elevated.    5) Smoking Rarely smoking. Discussed cessation.   Faith Gray, PharmD, [place] PGY2 Cardiology Pharmacy Resident  Faith Gray, PharmD, BCPS, [facility], CPP Heart  Failure Clinic Pharmacist [phone]

## 2020-11-22 ENCOUNTER — Other Ambulatory Visit: Payer: Self-pay

## 2020-11-22 ENCOUNTER — Ambulatory Visit (HOSPITAL_COMMUNITY)
Admission: RE | Admit: 2020-11-22 | Discharge: 2020-11-22 | Disposition: A | Discharge status: Home / Self Care | Payer: Medicare Other | Source: Ambulatory Visit | Attending: Internal Medicine | Admitting: Internal Medicine

## 2020-11-22 VITALS — BP 156/82 | HR 68 | Wt 115.2 lb

## 2020-11-22 DIAGNOSIS — I253 Aneurysm of heart: Secondary | ICD-10-CM | POA: Diagnosis not present

## 2020-11-22 DIAGNOSIS — Z7901 Long term (current) use of anticoagulants: Secondary | ICD-10-CM | POA: Insufficient documentation

## 2020-11-22 DIAGNOSIS — Z79899 Other long term (current) drug therapy: Secondary | ICD-10-CM | POA: Insufficient documentation

## 2020-11-22 DIAGNOSIS — Z87891 Personal history of nicotine dependence: Secondary | ICD-10-CM | POA: Diagnosis not present

## 2020-11-22 DIAGNOSIS — I110 Hypertensive heart disease with heart failure: Secondary | ICD-10-CM | POA: Insufficient documentation

## 2020-11-22 DIAGNOSIS — I5022 Chronic systolic (congestive) heart failure: Secondary | ICD-10-CM | POA: Insufficient documentation

## 2020-11-22 DIAGNOSIS — I2510 Atherosclerotic heart disease of native coronary artery without angina pectoris: Secondary | ICD-10-CM | POA: Insufficient documentation

## 2020-11-22 DIAGNOSIS — I255 Ischemic cardiomyopathy: Secondary | ICD-10-CM | POA: Diagnosis not present

## 2020-11-22 MED ORDER — SPIRONOLACTONE 25 MG PO TABS: 25.0000 mg | ORAL_TABLET | Freq: Every day | ORAL | 11 refills | Status: DC

## 2020-11-22 MED ORDER — HYDRALAZINE HCL 100 MG PO TABS: 100.0000 mg | ORAL_TABLET | Freq: Three times a day (TID) | ORAL | 11 refills | Status: DC

## 2020-11-22 NOTE — Progress Notes (Signed)
HF MD: Dr Haroldine Laws.  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    Admitted to [facility] 08/2012 for new onset acute systolic HF. Echo showed EF 25-30% with diffuse hypokinesis. Mild MR.   Admitted to [facility] 02/2013 for near syncope after taking hydralazine early and jumping up quickly. Work up negative. ECHO EF improved to 40-45%, Severe hypokinesis base inferolateral. Akinesis base/mid inferior wall. 30-day event monitor placed 03/11/13. This showed no significant arrhythmias.  She returned for HF follow up with Dr. Haroldine Laws on 11/01/20. She was feeling pretty good and had mild SOB. She was still walking 15 mins about 4x/week. No CP. No edema, orthopnea or PND. Was still smoking a couple cigarettes here and there. No problems with medications. Echo showed EF 30-35%, large aneurysm at the base to mid inferior wall. Mild to moderate MR. RV normal.  Today she returns to HF clinic for pharmacist medication titration. At last visit with MD, spironolactone 12.5 mg daily was started. She is feeling well overall. Taking all medications as prescribed and tolerating them well. Denies dizziness, lightheadedness, fatigue. No CP or palpitations. Her breathing is fine and she is able to walk 6-7 blocks before feeling SOB. Weight up 3 lbs in clinic today, stable at home ~115 lbs. No PND/Orthopnea. Appetite is good. Follows low-salt diet.  HF Medications: Carvedilol 12.5 mg BID Entresto 97/103 mg BID Spironolactone 12.5 mg daily Hydralazine 75 mg TID Isosorbide mononitrate 60 mg daily  Has the patient been experiencing any side effects to the medications prescribed?  no  Does the patient have any problems obtaining medications due to transportation or finances?   no - [place] Medicaid and Medicare Part A/B  Understanding of  regimen: good Understanding of indications: good Potential of compliance: good Patient understands to avoid NSAIDs. Patient understands to avoid decongestants.    Pertinent Lab Values: . 11/17/20: Serum creatinine 1.44, BUN 14, Potassium 4.4, Sodium 138 . 11/01/20: BNP 332.2   Vital Signs: . Weight: 115.2 lbs (last clinic weight: 112 lbs) . Blood pressure: 156/82  . Heart rate: 68   Assessment/Plan: 1) Chronic systolic HF:  -Ischemic cardiomyopathy. 2019:  EF ~35%  She has a large inferior wall aneurysm.  - Echo 11/01/20: EF 30-35%  large aneurysm at the base to mid inferior wall. Mild to moderate MR.  - Chronic NYHA II. Volume status stable on exam.   - Continue carvedilol 12.5 mg BID. Previously not titrated given low HR. Improved today to 68 bpm, seems to range between 50-60s with carvedilol. - Continue Entresto 97/103 mg BID - Increase spironolactone 25 mg daily. Check BMET at follow-up visit on 12/02/20 - Increase hydralazine to 100 mg TID  - Continue isosorbide mononitrate 60 mg daily. Consider increase at next visit. - Consider addition of SGLT2i at next visit. - Follow-up on 12/02/20 to check BP and BMET. - Previously noted that patient wants to defer ICD decision for now   2) CAD:  - No s/s of angina  - Not on ASA with warfarin use.  - Continue atorvastatin and carvedilol  3) LV aneurysm:   - Felt to be high-risk for embolic phenomenon - Continue warfarin. Dosing per warfarin clinic - No bleeding.  4) HTN - Remains elevated but improved since last visit. - Increase spironolactone to 25 mg daily -  Increase hydralazine to 100 mg TID - Continue carvedilol, Entresto, isosorbide mononitrate as above - Consider adding amlodipine if BP remains elevated.    5) Smoking Rarely smoking. Previously encouraged to stop smoking.   Richardine Service, PharmD, [place] PGY2 Cardiology Pharmacy Resident  Audry Riles, PharmD, BCPS, [facility], CPP Heart Failure Clinic  Pharmacist [phone]

## 2020-11-22 NOTE — Patient Instructions (Signed)
It was a pleasure seeing you today!  MEDICATIONS: -We are changing your medications today -Increase hydralazine to 100 mg (1 tablet) three times daily. - Increase spironolactone to 25 mg (1 tablet) daily -Call if you have questions about your medications.   NEXT APPOINTMENT: Return to clinic in 2 weeks with Pharmacy Clinic.  In general, to take care of your heart failure: -Limit your fluid intake to 2 Liters (half-gallon) per day.   -Limit your salt intake to ideally 2-3 grams (2000-3000 mg) per day. -Weigh yourself daily and record, and bring that "weight diary" to your next appointment.  (Weight gain of 2-3 pounds in 1 day typically means fluid weight.) -The medications for your heart are to help your heart and help you live longer.   -Please contact us before stopping any of your heart medications.  Call the clinic at [phone] with questions or to reschedule future appointments.

## 2020-11-25 NOTE — Progress Notes (Signed)
HF MD: Dr Haroldine Laws.  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    Admitted to [facility] 08/2012 for new onset acute systolic HF. Echo showed EF 25-30% with diffuse hypokinesis. Mild MR.   Admitted to [facility] 02/2013 for near syncope after taking hydralazine early and jumping up quickly. Work up negative. ECHO EF improved to 40-45%, Severe hypokinesis base inferolateral. Akinesis base/mid inferior wall. 30-day event monitor placed 03/11/13. This showed no significant arrhythmias.  She returned for HF follow up with Dr. Haroldine Laws on 11/01/20. She was feeling pretty good and had mild SOB. She was still walking 15 mins about 4x/week. No CP. No edema, orthopnea or PND. Was still smoking a couple cigarettes here and there. No problems with medications. Echo showed EF 30-35%, large aneurysm at the base to mid inferior wall. Mild to moderate MR. RV normal.  She returned to HF clinic for pharmacist medication titration on 11/22/20. At last visit with MD, spironolactone 12.5 mg daily was started. She was feeling well overall. Was taking all medications as prescribed and tolerating them well. Denied dizziness, lightheadedness, fatigue. No CP or palpitations. Her breathing was fine and she was able to walk 6-7 blocks before feeling SOB. Weight was up 3 lbs in clinic, stable at home ~115 lbs. No PND/Orthopnea. Appetite was good. Followed low-salt diet.  Today she returns to HF clinic for pharmacist medication titration. At last visit with pharmacy clinic, spironolactone was increased to 25 mg daily and hydralazine was increased to 100 mg TID. Overall she is feeling well today. States she has occasional dizziness, but this is usually when her blood pressure is elevated. No chest pain or palpitations. She tries to stay active, walks  5-6 blocks approximately 4 times per week, although notes she is slower than she used to be. Weight at home has been stable. She does not take any diuretic. No LEE, PND or orthopnea.   HF Medications: Carvedilol 12.5 mg BID Entresto 97/103 mg BID Spironolactone 25 mg daily Hydralazine 100 mg TID Isosorbide mononitrate 60 mg daily  Has the patient been experiencing any side effects to the medications prescribed?  no  Does the patient have any problems obtaining medications due to transportation or finances?   no - [place] Medicaid and [employer] Medicare  Understanding of regimen: good Understanding of indications: good Potential of compliance: good Patient understands to avoid NSAIDs. Patient understands to avoid decongestants.    Pertinent Lab Values: . 12/02/20: Serum creatinine 1.48, BUN 25, Potassium 4.5, Sodium 131 . 11/17/20: Serum creatinine 1.44, BUN 14, Potassium 4.4, Sodium 138 . 11/01/20: BNP 332.2   Vital Signs: . Weight: 111.6 lbs (last clinic weight: 115.2 lbs) . Blood pressure: 144/68 - did not take medications this morning.  [name] Heart rate: 73   Assessment/Plan: 1) Chronic systolic HF:  -Ischemic cardiomyopathy. 2019:  EF ~35%  She has a large inferior wall aneurysm.  - Echo 11/01/20: EF 30-35%  large aneurysm at the base to mid inferior wall. Mild to moderate MR.  - Chronic NYHA II. Volume status stable on exam.   - Continue carvedilol 12.5 mg BID. Previously not titrated given low HR. HR seems to range between 50-60s with carvedilol. She had not taken her medications this morning, so HR 73 in clinic.  - Continue Entresto 97/103 mg BID -  Continue spironolactone 25 mg daily. - Continue hydralazine to 100 mg TID  - Increase isosorbide mononitrate to 90 mg daily.  - Start Jardiance 10 mg daily - Follow-up in 1 month with Pharmacy Clinic - Previously noted that patient wants to defer ICD decision for now   2) CAD:  - No s/s of angina  - Not on ASA with warfarin use.  -  Continue atorvastatin and carvedilol  3) LV aneurysm:   - Felt to be high-risk for embolic phenomenon - Continue warfarin. Dosing per warfarin clinic - No bleeding.  4) HTN - Remains elevated but improved since last visit. Of note she had not yet taken her morning medications yet.  - Changes as above - Consider adding amlodipine if BP remains elevated.    5) Smoking Rarely smoking. Previously encouraged to stop smoking.     Audry Riles, PharmD, BCPS, BCCP, CPP Heart Failure Clinic Pharmacist [phone]

## 2020-12-02 ENCOUNTER — Ambulatory Visit (HOSPITAL_COMMUNITY)
Admission: RE | Admit: 2020-12-02 | Discharge: 2020-12-02 | Disposition: A | Discharge status: Home / Self Care | Payer: Medicare Other | Source: Ambulatory Visit | Attending: Internal Medicine | Admitting: Internal Medicine

## 2020-12-02 ENCOUNTER — Other Ambulatory Visit: Payer: Self-pay

## 2020-12-02 VITALS — BP 144/68 | HR 73 | Wt 111.6 lb

## 2020-12-02 DIAGNOSIS — I2510 Atherosclerotic heart disease of native coronary artery without angina pectoris: Secondary | ICD-10-CM | POA: Insufficient documentation

## 2020-12-02 DIAGNOSIS — I110 Hypertensive heart disease with heart failure: Secondary | ICD-10-CM | POA: Diagnosis not present

## 2020-12-02 DIAGNOSIS — F17200 Nicotine dependence, unspecified, uncomplicated: Secondary | ICD-10-CM | POA: Insufficient documentation

## 2020-12-02 DIAGNOSIS — Z7901 Long term (current) use of anticoagulants: Secondary | ICD-10-CM | POA: Insufficient documentation

## 2020-12-02 DIAGNOSIS — I5022 Chronic systolic (congestive) heart failure: Secondary | ICD-10-CM

## 2020-12-02 DIAGNOSIS — I255 Ischemic cardiomyopathy: Secondary | ICD-10-CM | POA: Insufficient documentation

## 2020-12-02 DIAGNOSIS — I253 Aneurysm of heart: Secondary | ICD-10-CM | POA: Diagnosis not present

## 2020-12-02 LAB — BASIC METABOLIC PANEL
Anion gap: 9 (ref 5–15)
BUN: 25 mg/dL — ABNORMAL HIGH (ref 8–23)
CO2: 20 mmol/L — ABNORMAL LOW (ref 22–32)
Calcium: 9.1 mg/dL (ref 8.9–10.3)
Chloride: 102 mmol/L (ref 98–111)
Creatinine, Ser: 1.48 mg/dL — ABNORMAL HIGH (ref 0.44–1.00)
GFR, Estimated: 36 mL/min — ABNORMAL LOW (ref >60)
Glucose, Bld: 84 mg/dL (ref 70–99)
Potassium: 4.5 mmol/L (ref 3.5–5.1)
Sodium: 131 mmol/L — ABNORMAL LOW (ref 135–145)

## 2020-12-02 MED ORDER — ISOSORBIDE MONONITRATE ER 60 MG PO TB24: 90.0000 mg | ORAL_TABLET | Freq: Every day | ORAL | 3 refills | Status: DC

## 2020-12-02 MED ORDER — EMPAGLIFLOZIN 10 MG PO TABS: 10.0000 mg | ORAL_TABLET | Freq: Every day | ORAL | 3 refills | Status: DC

## 2020-12-02 NOTE — Patient Instructions (Addendum)
It was a pleasure seeing you today!  MEDICATIONS: -We are changing your medications today -Increase isosorbide mononitrate (Imdur) to 90 mg (1.5 tablets) daily -Start Jardiance 10 mg (1 tablet) daily -Call if you have questions about your medications.  LABS: -We will call you if your labs need attention.  NEXT APPOINTMENT: Return to clinic in 4 weeks with Pharmacy Clinic.  In general, to take care of your heart failure: -Limit your fluid intake to 2 Liters (half-gallon) per day.   -Limit your salt intake to ideally 2-3 grams (2000-3000 mg) per day. -Weigh yourself daily and record, and bring that "weight diary" to your next appointment.  (Weight gain of 2-3 pounds in 1 day typically means fluid weight.) -The medications for your heart are to help your heart and help you live longer.   -Please contact us before stopping any of your heart medications.  Call the clinic at [phone] with questions or to reschedule future appointments.

## 2020-12-06 ENCOUNTER — Ambulatory Visit (INDEPENDENT_AMBULATORY_CARE_PROVIDER_SITE_OTHER): Payer: Medicare Other | Admitting: *Deleted

## 2020-12-06 ENCOUNTER — Other Ambulatory Visit: Payer: Self-pay

## 2020-12-06 DIAGNOSIS — I253 Aneurysm of heart: Secondary | ICD-10-CM | POA: Diagnosis not present

## 2020-12-06 DIAGNOSIS — Z7901 Long term (current) use of anticoagulants: Secondary | ICD-10-CM | POA: Diagnosis not present

## 2020-12-06 LAB — POCT INR: INR: 2.4 (ref 2.0–3.0)

## 2020-12-06 NOTE — Patient Instructions (Signed)
Description   Continue taking Warfarin 1 tablet every day except 1/2 tablet on  Thursdays. Recheck in 4 weeks. Call with any questions or concerns [phone]

## 2020-12-15 ENCOUNTER — Other Ambulatory Visit (HOSPITAL_COMMUNITY): Payer: Self-pay | Admitting: Internal Medicine

## 2020-12-24 ENCOUNTER — Ambulatory Visit
Admission: EM | Admit: 2020-12-24 | Discharge: 2020-12-24 | Disposition: A | Discharge status: Home / Self Care | Payer: Medicare Other

## 2020-12-24 ENCOUNTER — Other Ambulatory Visit: Payer: Self-pay

## 2020-12-24 DIAGNOSIS — H9203 Otalgia, bilateral: Secondary | ICD-10-CM | POA: Diagnosis not present

## 2020-12-24 DIAGNOSIS — H6983 Other specified disorders of Eustachian tube, bilateral: Secondary | ICD-10-CM

## 2020-12-24 NOTE — ED Triage Notes (Signed)
Pt c/o "scabing to ears" 2wks ago, c/o lt ear pressure and hearing a sound when she chews x 4 days.

## 2020-12-24 NOTE — Discharge Instructions (Signed)
Take plain over-the-counter Mucinex and Tylenol as directed.  Follow up with your primary care provider if your symptoms are not improving.

## 2020-12-24 NOTE — ED Provider Notes (Signed)
[facility]    CSN: [id number] Arrival date & time: 12/24/20  1938      History   Chief Complaint Chief Complaint  Patient presents with  . Otalgia    HPI Faith Gray is a 80 y.o. female.   Patient presents with bilateral ear pressure and hearing a wind-like sound x2 weeks.  She also reports "scabbing" in her ear canals.  She denies fever, chills, sore throat, cough, shortness of breath, vomiting, diarrhea, or other symptoms.  No treatments attempted at home.  She declines COVID test.  Her medical history includes NSTEMI, acute systolic heart failure, CAD, hypertension.  The history is provided by the patient and medical records.    Past Medical History:  Diagnosis Date  . Anemia   . Arthritis   . CAD (coronary artery disease) 09/11/2012  . CHF (congestive heart failure) ([place])   . Dental caries   . Essential hypertension 10/04/2012  . Ischemic cardiomyopathy   . NSTEMI (non-ST elevated myocardial infarction) ([place]) 09/11/2012  . Shortness of breath     Patient Active Problem List   Diagnosis Date Noted  . Syncope 02/24/2013  . Essential hypertension 10/04/2012  . Chronic systolic heart failure ([place]) 09/17/2012  . Aneurysm of left ventricle of heart 09/16/2012  . Long term (current) use of anticoagulants 09/16/2012  . CAD (coronary artery disease) 09/11/2012  . NSTEMI (non-ST elevated myocardial infarction) ([place]) 09/11/2012  . Acute systolic heart failure ([place]) 09/09/2012  . Chest tightness 09/09/2012  . CHF exacerbation ([place]) 09/07/2012  . SOB (shortness of breath) 09/07/2012  . Chest pain 09/07/2012  . EKG abnormalities 09/07/2012  . Normochromic anemia 09/07/2012    Past Surgical History:  Procedure Laterality Date  . CARDIAC CATHETERIZATION    . LEFT AND RIGHT HEART CATHETERIZATION WITH CORONARY ANGIOGRAM N/A 09/10/2012   Procedure: LEFT AND RIGHT HEART CATHETERIZATION WITH CORONARY ANGIOGRAM;  Surgeon: Jolaine Artist, MD;  Location: [facility] CATH  LAB;  Service: Cardiovascular;  Laterality: N/A;  . MULTIPLE EXTRACTIONS WITH ALVEOLOPLASTY N/A 02/16/2017   Procedure: MULTIPLE EXTRACTION WITH ALVEOLOPLASTY;  Surgeon: Diona Browner, DDS;  Location: [facility];  Service: Oral Surgery;  Laterality: N/A;    OB History   No obstetric history on file.      Home Medications    Prior to Admission medications   Medication Sig Start Date End Date Taking? Authorizing Provider  atorvastatin (LIPITOR) 80 MG tablet TAKE 1 TABLET BY MOUTH DAILY AT 6PM 05/19/19   Bensimhon, Shaune Pascal, MD  carvedilol (COREG) 12.5 MG tablet Take 1 tablet (12.5 mg total) by mouth 2 (two) times daily with a meal. 08/19/20   Bensimhon, Shaune Pascal, MD  empagliflozin (JARDIANCE) 10 MG TABS tablet Take 1 tablet (10 mg total) by mouth daily. 12/02/20   Bensimhon, Shaune Pascal, MD  ENTRESTO 97-103 MG TAKE 1 TABLET TWICE DAILY (DOSE CHANGE) 12/16/20   Bensimhon, Shaune Pascal, MD  hydrALAZINE (APRESOLINE) 100 MG tablet Take 1 tablet (100 mg total) by mouth 3 (three) times daily. 11/22/20   Bensimhon, Shaune Pascal, MD  isosorbide mononitrate (IMDUR) 60 MG 24 hr tablet Take 1.5 tablets (90 mg total) by mouth daily. 12/02/20   Bensimhon, Shaune Pascal, MD  spironolactone (ALDACTONE) 25 MG tablet Take 1 tablet (25 mg total) by mouth daily. 11/22/20   Bensimhon, Shaune Pascal, MD  warfarin (COUMADIN) 6 MG tablet Take 1/2 a tablet to 1 tablet by mouth daily as directed by the coumadin clinic. 11/11/20   Bensimhon, Shaune Pascal,  MD  furosemide (LASIX) 40 MG tablet Take 1 tablet (40 mg total) by mouth as needed. 02/05/18 04/13/20  Bensimhon, Shaune Pascal, MD    Family History Family History  Problem Relation Age of Onset  . Sickle cell anemia Brother     Social History Social History   Tobacco Use  . Smoking status: Former Smoker    Packs/day: 0.50    Years: 20.00    Pack years: 10.00    Types: Cigarettes  . Smokeless tobacco: Never Used  Vaping Use  . Vaping Use: Never used  Substance Use Topics  . Alcohol use: No  .  Drug use: No     Allergies   Vicodin [hydrocodone-acetaminophen]   Review of Systems Review of Systems  Constitutional: Negative for chills and fever.  HENT: Positive for ear pain. Negative for sore throat.   Eyes: Negative for pain and visual disturbance.  Respiratory: Negative for cough and shortness of breath.   Cardiovascular: Negative for chest pain and palpitations.  Gastrointestinal: Negative for abdominal pain, diarrhea and vomiting.  Genitourinary: Negative for dysuria and hematuria.  Musculoskeletal: Negative for arthralgias and back pain.  Skin: Negative for color change and rash.  Neurological: Negative for seizures and syncope.  All other systems reviewed and are negative.    Physical Exam Triage Vital Signs ED Triage Vitals  Enc Vitals Group     BP      Pulse      Resp      Temp      Temp src      SpO2      Weight      Height      Head Circumference      Peak Flow      Pain Score      Pain Loc      Pain Edu?      Excl. in [place]?    No data found.  Updated Vital Signs BP 132/84 (BP Location: Left Arm)   Pulse 60   Temp 98 F (36.7 C) (Oral)   Resp 20   SpO2 97%   Visual Acuity Right Eye Distance:   Left Eye Distance:   Bilateral Distance:    Right Eye Near:   Left Eye Near:    Bilateral Near:     Physical Exam Vitals and nursing note reviewed.  Constitutional:      General: She is not in acute distress.    Appearance: She is well-developed.  HENT:     Head: Normocephalic and atraumatic.     Right Ear: Tympanic membrane and ear canal normal.     Left Ear: Tympanic membrane and ear canal normal.     Nose: Nose normal.     Mouth/Throat:     Mouth: Mucous membranes are moist.     Pharynx: Oropharynx is clear.  Eyes:     Conjunctiva/sclera: Conjunctivae normal.  Cardiovascular:     Rate and Rhythm: Normal rate and regular rhythm.     Heart sounds: Normal heart sounds.  Pulmonary:     Effort: Pulmonary effort is normal. No  respiratory distress.     Breath sounds: Normal breath sounds.  Abdominal:     Palpations: Abdomen is soft.     Tenderness: There is no abdominal tenderness.  Musculoskeletal:     Cervical back: Neck supple.  Skin:    General: Skin is warm and dry.  Neurological:     General: No focal deficit present.  Mental Status: She is alert and oriented to person, place, and time.  Psychiatric:        Mood and Affect: Mood normal.        Behavior: Behavior normal.      UC Treatments / Results  Labs (all labs ordered are listed, but only abnormal results are displayed) Labs Reviewed - No data to display  EKG   Radiology No results found.  Procedures Procedures (including critical care time)  Medications Ordered in UC Medications - No data to display  Initial Impression / Assessment and Plan / UC Course  I have reviewed the triage vital signs and the nursing notes.  Pertinent labs & imaging results that were available during my care of the patient were reviewed by me and considered in my medical decision making (see chart for details).   Bilateral otalgia and eustachian tube dysfunction.  Instructed patient to take Tylenol and OTC plain Mucinex.  Discussed that she should follow-up with her PCP if her symptoms are not improving.  She declines COVID test today.  She agrees to plan of care.   Final Clinical Impressions(s) / UC Diagnoses   Final diagnoses:  Otalgia of both ears  Dysfunction of both eustachian tubes     Discharge Instructions     Take plain over-the-counter Mucinex and Tylenol as directed.  Follow up with your primary care provider if your symptoms are not improving.        ED Prescriptions    None     PDMP not reviewed this encounter.   Sharion Balloon, NP 12/24/20 2025

## 2021-01-09 NOTE — Progress Notes (Incomplete)
***In Progress*** HF MD: Dr Haroldine Laws.  PCP: [facility]  HPI:  Ms. 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   Admitted to [facility] 08/2012 for new onset acute systolicHF. Echo showed EF 25-30% with diffuse hypokinesis. Mild MR.   Admitted to [facility] 02/2013 for near syncope after taking hydralazine early and jumping up quickly. Work up negative. ECHO EF improved to 40-45%, Severe hypokinesis base inferolateral. Akinesis base/mid inferior wall. 30-day event monitor placed 03/11/13. This showed no significant arrhythmias.  She returned for HF follow up with Dr. Haroldine Laws on 11/01/20.She was feeling pretty good and had mild SOB. She was still walking 15 mins about 4x/week. No CP. No edema, orthopnea or PND. Was still smoking a couple cigarettes here and there. No problems with medications. Echo showed EF 30-35%,large aneurysm at the base to mid inferior wall. Mild to moderate MR.RV normal.  She returned to HF clinic for pharmacist medication titration on 11/22/20. At previous visit with MD, spironolactone 12.5 mg daily was started. She was feeling well overall. Was taking all medications as prescribed and tolerating them well. Denied dizziness, lightheadedness, fatigue. No CP or palpitations. Her breathing was fine and she was able to walk 6-7 blocks before feeling SOB. Weight was up 3 lbs in clinic, stable at home ~115 lbs. No PND/Orthopnea. Appetite was good. Followed low-salt diet.  She returned again to HF clinic for pharmacist medication titration on 12/02/20. At previous visit with pharmacy clinic, spironolactone was increased to 25 mg daily and hydralazine was increased to 100 mg TID. Overall she was feeling well. Stated she has occasional dizziness, but this was usually when her blood pressure was elevated. No chest pain or  palpitations. She was trying to stay active, walking 5-6 blocks approximately 4 times per week, although noted she was slower than she used to be. Weight at home had been stable. She was not taking any diuretic. No LEE, PND or orthopnea.   Today he returns to HF clinic for pharmacist medication titration. At last visit with last pharmacy visit, Jardiance 10 mg daily was started and isosorbide mononitrate was increased to 90 mg daily. ***   Overall feeling ***. Dizziness, lightheadedness, fatigue:  Chest pain or palpitations:  How is your breathing?: *** SOB: Able to complete all ADLs. Activity level ***  Weight at home pounds. Takes furosemide/torsemide/bumex *** mg *** daily.  LEE PND/Orthopnea  Appetite *** Low-salt diet:   Physical Exam Cost/affordability of meds    -no labs today (BMET has been stable, last BMET 3/17 after increasing spiro; added Jardiance and incr Imdur since) -prob cannot incr carvedilol d/t low HR -on max dose Entresto, spiro, hydral -incr Imdur to 120 mg daily? - if BP still elevated -no f/u appt scheduled - schedule w/ MD in 4 weeks?    HF Medications: Carvedilol 12.5 mg BID Entresto 97/103 mg BID Spironolactone 25 mg daily Jardiance 10 mg daily Hydralazine 100 mg TID Isosorbide mononitrate 90 mg daily  Has the patient been experiencing any side effects to the medications prescribed?  {YES NO:22349}  Does the patient have any problems obtaining medications due to transportation or finances?   {YES NO:22349}  Understanding of regimen: {excellent/good/fair/poor:19665} Understanding of indications: {excellent/good/fair/poor:19665} Potential of compliance: {excellent/good/fair/poor:19665} Patient understands to avoid NSAIDs. Patient understands to avoid decongestants.  Pertinent Lab Values: . 12/02/20: Serum creatinine 1.48,  Potassium 4.5   Vital Signs: . Weight: *** (last clinic weight: 111 lbs) . Blood pressure: ***  . Heart rate: ***    Assessment/Plan: 1) Chronic systolic HF:  -Ischemic cardiomyopathy. 2019: EF ~35% She has a large inferior wall aneurysm.  - Echo 11/01/20: EF 30-35%large aneurysm at the base to mid inferior wall. Mild to moderate MR. -ChronicNYHA II. Volume status stable on exam.  - Continue carvedilol 12.5 mg BID. Previously not titrated given low HR. HR seems to range between 50-60s with carvedilol. She had not taken her medications this morning, so HR 73 in clinic. ***  -Continue Entresto 97/103 mg BID - Continue spironolactone 25 mg daily - Continue hydralazine to 100 mg TID  - Continue isosorbide mononitrate 90 mg daily - Continue Jardiance 10 mg daily - Previously noted that patient wants to defer ICD decision for now  2) CAD:  - No s/s of angina - Not on ASA with warfarin use.  - Continue atorvastatin and carvedilol  3) LV aneurysm:  - Felt to be high-risk for embolic phenomenon - Continue warfarin. Dosing per warfarin clinic -No bleeding.  4) HTN - Remains elevated but improved since last visit. Of note she had not yet taken her morning medications yet. *** - Consider adding amlodipine if BP remains elevated.   5) Smoking Rarely smoking.Previously encouraged to stop smoking.    Esmeralda Links (PharmD Candidate 2022)  Audry Riles, PharmD, BCPS, BCCP, CPP Heart Failure Clinic Pharmacist [phone]

## 2021-01-10 ENCOUNTER — Inpatient Hospital Stay (HOSPITAL_COMMUNITY): Admission: RE | Admit: 2021-01-10 | Payer: Medicare Other | Source: Ambulatory Visit

## 2021-01-11 ENCOUNTER — Other Ambulatory Visit: Payer: Self-pay

## 2021-01-11 ENCOUNTER — Ambulatory Visit (INDEPENDENT_AMBULATORY_CARE_PROVIDER_SITE_OTHER): Payer: Medicare Other | Admitting: *Deleted

## 2021-01-11 DIAGNOSIS — Z7901 Long term (current) use of anticoagulants: Secondary | ICD-10-CM

## 2021-01-11 DIAGNOSIS — I253 Aneurysm of heart: Secondary | ICD-10-CM | POA: Diagnosis not present

## 2021-01-11 LAB — POCT INR: INR: 2.5 (ref 2.0–3.0)

## 2021-01-11 NOTE — Patient Instructions (Signed)
Description   Continue taking Warfarin 1 tablet every day except 1/2 tablet on  Thursdays. Recheck in 4 weeks. Call with any questions or concerns [phone]      

## 2021-01-13 DIAGNOSIS — H9202 Otalgia, left ear: Secondary | ICD-10-CM | POA: Diagnosis not present

## 2021-01-13 DIAGNOSIS — H6090 Unspecified otitis externa, unspecified ear: Secondary | ICD-10-CM | POA: Diagnosis not present

## 2021-01-17 ENCOUNTER — Other Ambulatory Visit (HOSPITAL_COMMUNITY): Payer: Self-pay | Admitting: Internal Medicine

## 2021-01-17 DIAGNOSIS — I253 Aneurysm of heart: Secondary | ICD-10-CM

## 2021-01-20 NOTE — Telephone Encounter (Signed)
Prescription refill request received for warfarin Lov: Bensimhon, 09/24/2020 Next INR check: 5/24 Warfarin tablet strength: 6mg 

## 2021-02-08 ENCOUNTER — Ambulatory Visit (INDEPENDENT_AMBULATORY_CARE_PROVIDER_SITE_OTHER): Payer: Medicare Other | Admitting: *Deleted

## 2021-02-08 ENCOUNTER — Other Ambulatory Visit: Payer: Self-pay

## 2021-02-08 DIAGNOSIS — Z5181 Encounter for therapeutic drug level monitoring: Secondary | ICD-10-CM

## 2021-02-08 DIAGNOSIS — Z7901 Long term (current) use of anticoagulants: Secondary | ICD-10-CM

## 2021-02-08 DIAGNOSIS — I253 Aneurysm of heart: Secondary | ICD-10-CM | POA: Diagnosis not present

## 2021-02-08 LAB — POCT INR: INR: 1.2 — AB (ref 2.0–3.0)

## 2021-02-08 NOTE — Patient Instructions (Signed)
Description   Take 1.5 tablets of warfarin today and tomorrow, then continue taking Warfarin 1 tablet everyday except 1/2 tablet on Thursdays. Recheck in 2 weeks- per pt request. Call with any questions or concerns [phone]

## 2021-02-21 ENCOUNTER — Ambulatory Visit (INDEPENDENT_AMBULATORY_CARE_PROVIDER_SITE_OTHER): Payer: Medicare Other | Admitting: *Deleted

## 2021-02-21 ENCOUNTER — Encounter (INDEPENDENT_AMBULATORY_CARE_PROVIDER_SITE_OTHER): Payer: Self-pay

## 2021-02-21 ENCOUNTER — Other Ambulatory Visit: Payer: Self-pay

## 2021-02-21 ENCOUNTER — Emergency Department (HOSPITAL_COMMUNITY)
Admission: EM | Admit: 2021-02-21 | Discharge: 2021-02-21 | Discharge status: Absent without leave | Payer: Medicare Other | Attending: Cardiology | Admitting: Cardiology

## 2021-02-21 ENCOUNTER — Emergency Department (HOSPITAL_COMMUNITY)
Admission: RE | Admit: 2021-02-21 | Discharge: 2021-02-21 | Disposition: A | Discharge status: Home / Self Care | Payer: Medicare Other | Source: Ambulatory Visit | Attending: Cardiology | Admitting: Cardiology

## 2021-02-21 ENCOUNTER — Encounter (HOSPITAL_COMMUNITY): Payer: Self-pay

## 2021-02-21 VITALS — BP 120/78 | HR 52 | Wt 111.0 lb

## 2021-02-21 DIAGNOSIS — Z7901 Long term (current) use of anticoagulants: Secondary | ICD-10-CM

## 2021-02-21 DIAGNOSIS — I253 Aneurysm of heart: Secondary | ICD-10-CM

## 2021-02-21 DIAGNOSIS — Z5181 Encounter for therapeutic drug level monitoring: Secondary | ICD-10-CM

## 2021-02-21 DIAGNOSIS — I5022 Chronic systolic (congestive) heart failure: Secondary | ICD-10-CM

## 2021-02-21 LAB — POCT INR: INR: 1.6 — AB (ref 2.0–3.0)

## 2021-02-21 NOTE — Patient Instructions (Signed)
It was a pleasure seeing you today!  MEDICATIONS: -No medication changes today -Call if you have questions about your medications.  NEXT APPOINTMENT: Return to clinic in 2 months with Dr. Haroldine Laws.  In general, to take care of your heart failure: -Limit your fluid intake to 2 Liters (half-gallon) per day.   -Limit your salt intake to ideally 2-3 grams (2000-3000 mg) per day. -Weigh yourself daily and record, and bring that "weight diary" to your next appointment.  (Weight gain of 2-3 pounds in 1 day typically means fluid weight.) -The medications for your heart are to help your heart and help you live longer.   -Please contact us before stopping any of your heart medications.  Call the clinic at [phone] with questions or to reschedule future appointments.

## 2021-02-21 NOTE — ED Notes (Signed)
Called patient in lobby with no answer

## 2021-02-21 NOTE — Progress Notes (Signed)
HF MD: Dr Haroldine Laws.  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    Admitted to [facility] 08/2012 for new onset acute systolic HF. Echo showed EF 25-30% with diffuse hypokinesis. Mild MR.   Admitted to [facility] 02/2013 for near syncope after taking hydralazine early and jumping up quickly. Work up negative. ECHO EF improved to 40-45%, Severe hypokinesis base inferolateral. Akinesis base/mid inferior wall. 30-day event monitor placed 03/11/13. This showed no significant arrhythmias.  She returned for HF follow up with Dr. Haroldine Laws on 11/01/20. She was feeling pretty good and had mild SOB. She was still walking 15 mins about 4x/week. No CP. No edema, orthopnea or PND. Was still smoking a couple cigarettes here and there. No problems with medications. Echo showed EF 30-35%, large aneurysm at the base to mid inferior wall. Mild to moderate MR. RV normal.  She returned to HF clinic for pharmacist medication titration on 11/22/20. At last visit with MD, spironolactone 12.5 mg daily was started. She was feeling well overall. Was taking all medications as prescribed and tolerating them well. Denied dizziness, lightheadedness, fatigue. No CP or palpitations. Her breathing was fine and she was able to walk 6-7 blocks before feeling SOB. Weight was up 3 lbs in clinic, stable at home ~115 lbs. No PND/Orthopnea. Appetite was good. Followed low-salt diet.  She returned to HF clinic for pharmacist medication titration on 12/02/20. At last visit with pharmacy clinic, spironolactone was increased to 25 mg daily and hydralazine was increased to 100 mg TID. Overall she was feeling well. Stated she had occasional dizziness, but this was usually when her blood pressure was elevated. No chest pain or palpitations. She tried to stay active,  walked 5-6 blocks approximately 4 times per week, although noted she was slower than she used to be. Weight at home had been stable. She did not take any diuretic. No LEE, PND or orthopnea.   Today she returns to HF clinic for pharmacist medication titration. At last visit with pharmacist, isosorbide mononitrate was increased to 90 mg daily and she was started on Jardiance 10 mg daily. She is overall feeling great. No dizziness or lightheadedness. She continues to have some fatigue but this is stable. No chest pain or palpitations. She reports her breathing is fine and is able to walk 5-6 blocks before feeling SOB. No LEE, PND, or orthopnea. Follows a low-salt diet. Taking all medications as prescribed. She reports elevated home BP readings but only checks prior to taking medications. She is willing to start checking BP 1-2 hours after medications and recording readings.  Of note, patient did not visit the ED today. She mistakenly checked into the wrong area for this visit.  HF Medications: Carvedilol 12.5 mg BID Entresto 97/103 mg BID Spironolactone 25 mg daily Jardiance 10 mg daily Hydralazine 100 mg TID Isosorbide mononitrate 90 mg daily  Has the patient been experiencing any side effects to the medications prescribed?  no  Does the patient have any problems obtaining medications due to transportation or finances?   no - [place] Medicaid and [employer] Medicare  Understanding of regimen: good Understanding of indications: good Potential of compliance: good Patient understands to avoid NSAIDs. Patient understands to avoid decongestants.    Pertinent Lab Values: . 12/02/20: Serum creatinine 1.48, BUN 25, Potassium 4.5, Sodium 131 .  11/17/20: Serum creatinine 1.44, BUN 14, Potassium 4.4, Sodium 138 . 11/01/20: BNP 332.2   Vital Signs: . Weight: 111 lbs (last clinic weight: 111 lbs) . Blood pressure: 120/78  . Heart rate: 52  Assessment/Plan: 1) Chronic systolic HF:  -Ischemic cardiomyopathy.  2019:  EF ~35%  She has a large inferior wall aneurysm.  - Echo 11/01/20: EF 30-35%  large aneurysm at the base to mid inferior wall. Mild to moderate MR.  - Chronic NYHA II. Volume status stable on exam.   - No medication changes today given controlled BP 120/78 and HR 52. - Continue carvedilol 12.5 mg BID. Previously not titrated given low HR. HR 52 today. - Continue Entresto 97/103 mg BID - Continue spironolactone 25 mg daily - Continue hydralazine 100 mg TID  - Continue isosorbide mononitrate 90 mg daily.  - Continue Jardiance 10 mg daily - Follow-up in 2-3 months with Dr. Haroldine Laws - Previously noted that patient wants to defer ICD decision for now   2) CAD:  - No s/s of angina  - Not on ASA with warfarin use.  - Continue atorvastatin and carvedilol  3) LV aneurysm:   - Felt to be high-risk for embolic phenomenon - Continue warfarin. Dosing per warfarin clinic - No bleeding.  4) HTN - Controlled today in clinic, 120/78 mmHg. Took all medications this morning.  - Encouraged patient to start checking BP at home 1-2 hours after taking medications  5) Smoking Rarely smoking. Previously encouraged to stop smoking.   Richardine Service, PharmD, [place] PGY2 Cardiology Pharmacy Resident  Kerby Nora, PharmD, BCPS Heart Failure Clinic Pharmacist [phone]

## 2021-02-21 NOTE — Patient Instructions (Signed)
Description   Take 1.5 tablets of warfarin today and tomorrow, then start taking warfarin 1 tablet daily. Recheck INR in 1-2 weeks. Coumadin Clinic [phone].

## 2021-03-04 ENCOUNTER — Ambulatory Visit (INDEPENDENT_AMBULATORY_CARE_PROVIDER_SITE_OTHER): Payer: Medicare Other | Admitting: Pharmacist

## 2021-03-04 ENCOUNTER — Other Ambulatory Visit: Payer: Self-pay

## 2021-03-04 DIAGNOSIS — Z7901 Long term (current) use of anticoagulants: Secondary | ICD-10-CM | POA: Diagnosis not present

## 2021-03-04 DIAGNOSIS — I253 Aneurysm of heart: Secondary | ICD-10-CM | POA: Diagnosis not present

## 2021-03-04 LAB — POCT INR: INR: 1.6 — AB (ref 2.0–3.0)

## 2021-03-04 NOTE — Patient Instructions (Signed)
Description   Take 1.5 tablets of warfarin today and tomorrow, then start taking warfarin 1 tablet daily except 1.5 tablets on Wednesdays. Recheck INR in 1-2 weeks. Coumadin Clinic [phone].

## 2021-03-14 ENCOUNTER — Ambulatory Visit (INDEPENDENT_AMBULATORY_CARE_PROVIDER_SITE_OTHER): Payer: Medicare Other | Admitting: *Deleted

## 2021-03-14 ENCOUNTER — Other Ambulatory Visit: Payer: Self-pay

## 2021-03-14 DIAGNOSIS — Z7901 Long term (current) use of anticoagulants: Secondary | ICD-10-CM | POA: Diagnosis not present

## 2021-03-14 DIAGNOSIS — I253 Aneurysm of heart: Secondary | ICD-10-CM

## 2021-03-14 LAB — POCT INR: INR: 2.3 (ref 2.0–3.0)

## 2021-03-14 NOTE — Patient Instructions (Signed)
Description   Continue taking Warfarin 1 tablet daily except 1.5 tablets on Wednesdays. Recheck INR in 3 weeks. Coumadin Clinic [phone].

## 2021-04-08 ENCOUNTER — Ambulatory Visit (INDEPENDENT_AMBULATORY_CARE_PROVIDER_SITE_OTHER): Payer: Medicare Other | Admitting: *Deleted

## 2021-04-08 ENCOUNTER — Other Ambulatory Visit: Payer: Self-pay

## 2021-04-08 DIAGNOSIS — Z7901 Long term (current) use of anticoagulants: Secondary | ICD-10-CM

## 2021-04-08 DIAGNOSIS — I253 Aneurysm of heart: Secondary | ICD-10-CM | POA: Diagnosis not present

## 2021-04-08 LAB — POCT INR: INR: 1.7 — AB (ref 2.0–3.0)

## 2021-04-08 NOTE — Patient Instructions (Signed)
Description   Today take 1.5 tablets then continue taking Warfarin 1 tablet daily except 1.5 tablets on Wednesdays. Recheck INR in 3 weeks. Coumadin Clinic [phone].

## 2021-04-15 DIAGNOSIS — M26622 Arthralgia of left temporomandibular joint: Secondary | ICD-10-CM | POA: Diagnosis not present

## 2021-04-15 DIAGNOSIS — I5022 Chronic systolic (congestive) heart failure: Secondary | ICD-10-CM | POA: Diagnosis not present

## 2021-04-15 DIAGNOSIS — I110 Hypertensive heart disease with heart failure: Secondary | ICD-10-CM | POA: Diagnosis not present

## 2021-04-15 DIAGNOSIS — I253 Aneurysm of heart: Secondary | ICD-10-CM | POA: Diagnosis not present

## 2021-04-29 ENCOUNTER — Ambulatory Visit (INDEPENDENT_AMBULATORY_CARE_PROVIDER_SITE_OTHER): Payer: Medicare Other

## 2021-04-29 ENCOUNTER — Other Ambulatory Visit: Payer: Self-pay

## 2021-04-29 DIAGNOSIS — I253 Aneurysm of heart: Secondary | ICD-10-CM | POA: Diagnosis not present

## 2021-04-29 DIAGNOSIS — Z7901 Long term (current) use of anticoagulants: Secondary | ICD-10-CM

## 2021-04-29 LAB — POCT INR: INR: 2.7 (ref 2.0–3.0)

## 2021-04-29 NOTE — Patient Instructions (Signed)
Description   Continue taking Warfarin 1 tablet daily except 1.5 tablets on Wednesdays. Recheck INR in 4 weeks. Coumadin Clinic [phone].

## 2021-05-20 ENCOUNTER — Encounter (HOSPITAL_COMMUNITY): Payer: Self-pay | Admitting: Internal Medicine

## 2021-05-20 ENCOUNTER — Other Ambulatory Visit: Payer: Self-pay

## 2021-05-20 ENCOUNTER — Ambulatory Visit (HOSPITAL_COMMUNITY)
Admit: 2021-05-20 | Discharge: 2021-05-20 | Disposition: A | Discharge status: Home / Self Care | Payer: Medicare Other | Attending: Internal Medicine | Admitting: Internal Medicine

## 2021-05-20 VITALS — BP 160/90 | HR 80 | Wt 107.6 lb

## 2021-05-20 DIAGNOSIS — Z7901 Long term (current) use of anticoagulants: Secondary | ICD-10-CM | POA: Insufficient documentation

## 2021-05-20 DIAGNOSIS — Z720 Tobacco use: Secondary | ICD-10-CM

## 2021-05-20 DIAGNOSIS — Z885 Allergy status to narcotic agent status: Secondary | ICD-10-CM | POA: Diagnosis not present

## 2021-05-20 DIAGNOSIS — I2510 Atherosclerotic heart disease of native coronary artery without angina pectoris: Secondary | ICD-10-CM | POA: Diagnosis not present

## 2021-05-20 DIAGNOSIS — Z79899 Other long term (current) drug therapy: Secondary | ICD-10-CM | POA: Insufficient documentation

## 2021-05-20 DIAGNOSIS — F17210 Nicotine dependence, cigarettes, uncomplicated: Secondary | ICD-10-CM | POA: Diagnosis not present

## 2021-05-20 DIAGNOSIS — I10 Essential (primary) hypertension: Secondary | ICD-10-CM | POA: Diagnosis not present

## 2021-05-20 DIAGNOSIS — I5022 Chronic systolic (congestive) heart failure: Secondary | ICD-10-CM | POA: Diagnosis not present

## 2021-05-20 DIAGNOSIS — I255 Ischemic cardiomyopathy: Secondary | ICD-10-CM | POA: Diagnosis not present

## 2021-05-20 DIAGNOSIS — Z7984 Long term (current) use of oral hypoglycemic drugs: Secondary | ICD-10-CM | POA: Diagnosis not present

## 2021-05-20 DIAGNOSIS — I252 Old myocardial infarction: Secondary | ICD-10-CM | POA: Diagnosis not present

## 2021-05-20 DIAGNOSIS — I110 Hypertensive heart disease with heart failure: Secondary | ICD-10-CM | POA: Diagnosis not present

## 2021-05-20 DIAGNOSIS — I5023 Acute on chronic systolic (congestive) heart failure: Secondary | ICD-10-CM | POA: Diagnosis not present

## 2021-05-20 MED ORDER — CARVEDILOL 12.5 MG PO TABS: 18.7500 mg | ORAL_TABLET | Freq: Two times a day (BID) | ORAL | 3 refills | Status: DC

## 2021-05-20 NOTE — Progress Notes (Signed)
ADVANCED HEART FAILURE CLINIC NOTE  Patient ID: Faith Gray, female   DOB: Jun 18, 1941, 80 y.o.   MRN: [id number]  HF MD: Dr Haroldine Laws.  PCP: [facility].   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    Admitted to [facility] 12/13 for new onset acute systolic HF. Echo showed EF 25-30% with diffuse hypokinesis. Mild MR.   Admitted to [facility] 6/14 for near syncope after taking hydralazine early and jumping up quickly. Work up negative. ECHO EF improved to 40-45%, Severe hypokinesis base inferolateral. Akinesis base/mid inferior wall. 30 day Event monitor placed 03/11/13. This showed no significant arrhythmias.  Here for FU.  Feeling well.  Denies any dyspea, chest pain, orthopnea or PND.  No dyspnea climbing stairs or with activities walks 5-6 blocks per day at least.  Stays very active, plays with grandkids.  BP at home has been [id number] systolic.  Weight stable at 110lbs at home.  HR runs 60-70.  Usually in the 70's at home.     Cardiac Studies  Echo 11/01/20: EF 30-35%  large aneurysm at the base to mid inferior wall. Mild to moderate MR. Personally reviewed Echo 3/18: EF 30-35% large aneurysm at the base to mid inferior wall. Echo 02/01/18: EF 35% large aneurysm at the base to mid inferior wall.  Cath 08/2012:  Left main: Normal  LAD: Gives off 3 diagonals. Mild diffuse plaquing with 30-40% lesion in midsection. Otherwise normal.  LCX: Tiny ramus. Large OM-1 & OM-2. 30% lesion in ostial LCX. Otherwise mild plaque.  RCA: Dominant vessel with diffuse disease. Diffuse 30-40% through midsection. In distal RCA there is a ruptured plaque with about 60% stenosis. PDA moderate diffuse disease. In distal RCA just prior to take-off of PLs there is a very focal area of myocardial bridging with severe systolic compression.  LV-gram done  in the RAO projection: Ejection fraction = 25-30%. There is a large aneurysm at the base to mid inferior wall. Anterior wall mild HK. No significant MR noted.   Review of systems complete and found to be negative unless listed in HPI.    Past Medical History:  Diagnosis Date   Anemia    Arthritis    CAD (coronary artery disease) 09/11/2012   CHF (congestive heart failure) ([place])    Dental caries    Essential hypertension 10/04/2012   Ischemic cardiomyopathy    NSTEMI (non-ST elevated myocardial infarction) ([place]) 09/11/2012   Shortness of breath    Current Outpatient Medications  Medication Sig Dispense Refill   atorvastatin (LIPITOR) 80 MG tablet TAKE 1 TABLET BY MOUTH DAILY AT 6PM 90 tablet 1   carvedilol (COREG) 12.5 MG tablet Take 1 tablet (12.5 mg total) by mouth 2 (two) times daily with a meal. 60 tablet 11   empagliflozin (JARDIANCE) 10 MG TABS tablet Take 1 tablet (10 mg total) by mouth daily. 90 tablet 3   ENTRESTO 97-103 MG TAKE 1 TABLET TWICE DAILY (DOSE CHANGE) 180 tablet 3   hydrALAZINE (APRESOLINE) 100 MG tablet Take 1 tablet (100 mg total) by mouth 3 (three) times daily. 90 tablet 11   isosorbide mononitrate (IMDUR) 60 MG 24 hr tablet Take 1.5 tablets (90 mg total) by mouth daily. 135 tablet 3   spironolactone (ALDACTONE) 25 MG tablet Take 1 tablet (25 mg total) by mouth daily. 30 tablet 11  warfarin (COUMADIN) 6 MG tablet TAKE 1/2 TABLET TO 1 TABLET DAILY AS DIRECTED BY THE COUMADIN CLINIC 100 tablet 0   No current facility-administered medications for this encounter.   Allergies  Allergen Reactions   Vicodin [Hydrocodone-Acetaminophen] Nausea And Vomiting    PHYSICAL EXAM: Vitals:   05/20/21 1456  BP: (!) 160/90  Pulse: 80  Weight: 48.8 kg (107 lb 9.6 oz)    Wt Readings from Last 3 Encounters:  05/20/21 48.8 kg (107 lb 9.6 oz)  02/21/21 50.3 kg (111 lb)  12/02/20 50.6 kg (111 lb 9.6 oz)   General:  Well appearing. No resp difficulty HEENT: normal Neck:  supple. no JVD. Carotids 2+ bilat; no bruits. No lymphadenopathy or thryomegaly appreciated. Cor: PMI nondisplaced. Regular rate & rhythm. 2/6 MR Lungs: clear Abdomen: soft, nontender, nondistended. No hepatosplenomegaly. No bruits or masses. Good bowel sounds. Extremities: no cyanosis, clubbing, rash, edema Neuro: alert & orientedx3, cranial nerves grossly intact. moves all 4 extremities w/o difficulty. Affect pleasant  1) Chronic systolic HF:  -Ischemic cardiomyopathy. 2019:  EF ~35%  She has a large inferior wall aneurysm.  - Echo 11/01/20: EF 30-35%  large aneurysm at the base to mid inferior wall. Mild to moderate MR. Personally reviewed - Chronic NYHA II. Volume status stable on exam.   - Continue Entresto 97/103 - Continue hydralazine 75 mg TID and imdur 60 mg daily.  - Continue spiro '25mg'$  - Continue jardiance 10 - Increase carvedilol to 18.'75mg'$  BID  - Wants to continue to defer ICD for now   2) CAD:  - No s/s of angina  - Not on ASA with warfarin use.  - Continue atorvastatin and b-blocker  3) LV aneurysm:   - Felt to be high-risk for embolic phenomenon -> Continue coumadin. Dosing per coumadin clinic - No bleeding. CBC today  4) HTN - At home and recent PCP appointment she has better control then today but still room for optimization - increase carvedilol to 18.'75mg'$  BID as above  5) Smoking Rarely smoking. Discussed cessation.   Faith Roan, MD  05/20/2021  Patient seen and examined with the above-signed Advanced Practice Provider and/or Housestaff. I personally reviewed laboratory data, imaging studies and relevant notes. I independently examined the patient and formulated the important aspects of the plan. I have edited the note to reflect any of my changes or salient points. I have personally discussed the plan with the patient and/or family.  Doing well. NYHA II. Volume status ok. BP remains elevated. Still smoking a little bit.   General:  Well appearing. No  resp difficulty HEENT: normal Neck: supple. no JVD. Carotids 2+ bilat; no bruits. No lymphadenopathy or thryomegaly appreciated. Cor: PMI nondisplaced. Regular rate & rhythm. No rubs, gallops or murmurs. Lungs: clear Abdomen: soft, nontender, nondistended. No hepatosplenomegaly. No bruits or masses. Good bowel sounds. Extremities: no cyanosis, clubbing, rash, edema Neuro: alert & orientedx3, cranial nerves grossly intact. moves all 4 extremities w/o difficulty. Affect pleasant  Overall stable. Compliant with meds. NYHA II. Volume ok. No angina. BP elevated. Will increase carvedilol. Encouraged smoking cessation.   [name], MD  4:53 PM

## 2021-05-20 NOTE — Patient Instructions (Signed)
Increase Carvedilol to 18.75 mg (1 & 1/2 tabs) Twice daily   Please call our office in February 2023 to schedule your follow up appointment  If you have any questions or concerns before your next appointment please send [place] a message through [place] or call our office at [phone].    TO LEAVE A MESSAGE FOR THE NURSE SELECT OPTION 2, PLEASE LEAVE A MESSAGE INCLUDING: YOUR NAME DATE OF BIRTH CALL BACK NUMBER REASON FOR CALL**this is important as we prioritize the call backs  YOU WILL RECEIVE A CALL BACK THE SAME DAY AS LONG AS YOU CALL BEFORE 4:00 PM  At the [facility], you and your health needs are our priority. As part of our continuing mission to provide you with exceptional heart care, we have created designated Provider Care Teams. These Care Teams include your primary Cardiologist (physician) and Advanced Practice Providers (APPs- Physician Assistants and Nurse Practitioners) who all work together to provide you with the care you need, when you need it.   You may see any of the following providers on your designated Care Team at your next follow up: Dr [name] Dr Loralie Champagne Dr Patrice Paradise, NP Lyda Jester, [place] Ginnie Smart Audry Riles, PharmD   Please be sure to bring in all your medications bottles to every appointment.

## 2021-05-27 DIAGNOSIS — I5022 Chronic systolic (congestive) heart failure: Secondary | ICD-10-CM | POA: Diagnosis not present

## 2021-05-27 DIAGNOSIS — Z0000 Encounter for general adult medical examination without abnormal findings: Secondary | ICD-10-CM | POA: Diagnosis not present

## 2021-05-27 DIAGNOSIS — D649 Anemia, unspecified: Secondary | ICD-10-CM | POA: Diagnosis not present

## 2021-05-27 DIAGNOSIS — R222 Localized swelling, mass and lump, trunk: Secondary | ICD-10-CM | POA: Diagnosis not present

## 2021-05-27 DIAGNOSIS — I110 Hypertensive heart disease with heart failure: Secondary | ICD-10-CM | POA: Diagnosis not present

## 2021-06-06 ENCOUNTER — Other Ambulatory Visit (HOSPITAL_COMMUNITY): Payer: Self-pay | Admitting: Internal Medicine

## 2021-06-06 DIAGNOSIS — I253 Aneurysm of heart: Secondary | ICD-10-CM

## 2021-06-07 ENCOUNTER — Other Ambulatory Visit: Payer: Self-pay

## 2021-06-07 ENCOUNTER — Ambulatory Visit (INDEPENDENT_AMBULATORY_CARE_PROVIDER_SITE_OTHER): Payer: Medicare Other | Admitting: *Deleted

## 2021-06-07 DIAGNOSIS — Z7901 Long term (current) use of anticoagulants: Secondary | ICD-10-CM

## 2021-06-07 DIAGNOSIS — I253 Aneurysm of heart: Secondary | ICD-10-CM

## 2021-06-07 LAB — POCT INR: INR: 2.7 (ref 2.0–3.0)

## 2021-06-07 NOTE — Patient Instructions (Signed)
Description   Continue taking Warfarin 1 tablet daily except 1.5 tablets on Wednesdays. Recheck INR in 5 weeks. Coumadin Clinic [phone].

## 2021-07-05 DIAGNOSIS — R1900 Intra-abdominal and pelvic swelling, mass and lump, unspecified site: Secondary | ICD-10-CM | POA: Diagnosis not present

## 2021-07-05 DIAGNOSIS — R222 Localized swelling, mass and lump, trunk: Secondary | ICD-10-CM | POA: Diagnosis not present

## 2021-08-05 ENCOUNTER — Ambulatory Visit (INDEPENDENT_AMBULATORY_CARE_PROVIDER_SITE_OTHER): Payer: Medicare Other

## 2021-08-05 ENCOUNTER — Other Ambulatory Visit: Payer: Self-pay

## 2021-08-05 DIAGNOSIS — Z7901 Long term (current) use of anticoagulants: Secondary | ICD-10-CM | POA: Diagnosis not present

## 2021-08-05 DIAGNOSIS — Z5181 Encounter for therapeutic drug level monitoring: Secondary | ICD-10-CM | POA: Diagnosis not present

## 2021-08-05 DIAGNOSIS — I253 Aneurysm of heart: Secondary | ICD-10-CM | POA: Diagnosis not present

## 2021-08-05 LAB — POCT INR: INR: 2.3 (ref 2.0–3.0)

## 2021-08-05 NOTE — Patient Instructions (Signed)
Continue taking Warfarin 1 tablet daily except 1.5 tablets on Wednesdays. Recheck INR in 6 weeks. Coumadin Clinic [phone].

## 2021-09-23 ENCOUNTER — Other Ambulatory Visit: Payer: Self-pay

## 2021-09-23 ENCOUNTER — Ambulatory Visit (INDEPENDENT_AMBULATORY_CARE_PROVIDER_SITE_OTHER): Payer: Medicare HMO | Admitting: *Deleted

## 2021-09-23 DIAGNOSIS — Z7901 Long term (current) use of anticoagulants: Secondary | ICD-10-CM | POA: Diagnosis not present

## 2021-09-23 DIAGNOSIS — I253 Aneurysm of heart: Secondary | ICD-10-CM

## 2021-09-23 LAB — POCT INR: INR: 1.6 — AB (ref 2.0–3.0)

## 2021-09-23 NOTE — Patient Instructions (Signed)
Description   Today take 1.5 tablets then continue taking Warfarin 1 tablet daily except 1.5 tablets on Wednesdays. Recheck INR in 3 weeks (normally 6 weeks). Coumadin Clinic [phone].

## 2021-09-26 ENCOUNTER — Other Ambulatory Visit (HOSPITAL_COMMUNITY): Payer: Self-pay | Admitting: Internal Medicine

## 2021-10-14 ENCOUNTER — Ambulatory Visit (INDEPENDENT_AMBULATORY_CARE_PROVIDER_SITE_OTHER): Payer: Medicare HMO

## 2021-10-14 ENCOUNTER — Other Ambulatory Visit: Payer: Self-pay

## 2021-10-14 DIAGNOSIS — I253 Aneurysm of heart: Secondary | ICD-10-CM | POA: Diagnosis not present

## 2021-10-14 DIAGNOSIS — Z7901 Long term (current) use of anticoagulants: Secondary | ICD-10-CM | POA: Diagnosis not present

## 2021-10-14 LAB — POCT INR: INR: 2.6 (ref 2.0–3.0)

## 2021-10-14 NOTE — Patient Instructions (Signed)
Description   Continue taking Warfarin 1 tablet daily except 1.5 tablets on Wednesdays. Recheck INR in 5 weeks (normally 6 weeks). Coumadin Clinic [phone].

## 2021-11-10 ENCOUNTER — Other Ambulatory Visit (HOSPITAL_COMMUNITY): Payer: Self-pay | Admitting: Internal Medicine

## 2021-11-10 DIAGNOSIS — I253 Aneurysm of heart: Secondary | ICD-10-CM

## 2021-11-10 NOTE — Telephone Encounter (Signed)
Prescription refill request received for warfarin Lov: 05/20/21 (Bensimhon)  Next INR check: 11/18/21 Warfarin tablet strength: 6mg   Appropriate dose and refill sent to requested pharmacy.

## 2021-11-16 ENCOUNTER — Other Ambulatory Visit (HOSPITAL_COMMUNITY): Payer: Self-pay | Admitting: Internal Medicine

## 2021-11-24 ENCOUNTER — Other Ambulatory Visit (HOSPITAL_COMMUNITY): Payer: Self-pay | Admitting: Internal Medicine

## 2021-12-02 ENCOUNTER — Ambulatory Visit (INDEPENDENT_AMBULATORY_CARE_PROVIDER_SITE_OTHER): Payer: Medicare HMO

## 2021-12-02 ENCOUNTER — Other Ambulatory Visit: Payer: Self-pay

## 2021-12-02 DIAGNOSIS — I253 Aneurysm of heart: Secondary | ICD-10-CM | POA: Diagnosis not present

## 2021-12-02 DIAGNOSIS — Z5181 Encounter for therapeutic drug level monitoring: Secondary | ICD-10-CM

## 2021-12-02 DIAGNOSIS — Z7901 Long term (current) use of anticoagulants: Secondary | ICD-10-CM | POA: Diagnosis not present

## 2021-12-02 LAB — POCT INR: INR: 1.8 — AB (ref 2.0–3.0)

## 2021-12-02 NOTE — Patient Instructions (Signed)
-   take extra 1/2 tablet tonight, then ?- Continue taking Warfarin 1 tablet daily except 1.5 tablets on Wednesdays.  ? -Recheck INR in 5 weeks (normally 6 weeks).  ?Coumadin Clinic [phone].  ?

## 2022-01-30 ENCOUNTER — Ambulatory Visit (INDEPENDENT_AMBULATORY_CARE_PROVIDER_SITE_OTHER): Payer: Medicare HMO | Admitting: *Deleted

## 2022-01-30 DIAGNOSIS — Z7901 Long term (current) use of anticoagulants: Secondary | ICD-10-CM | POA: Diagnosis not present

## 2022-01-30 DIAGNOSIS — I253 Aneurysm of heart: Secondary | ICD-10-CM | POA: Diagnosis not present

## 2022-01-30 LAB — POCT INR: INR: 2.5 (ref 2.0–3.0)

## 2022-01-30 NOTE — Patient Instructions (Signed)
Description   Continue taking Warfarin 1 tablet daily except 1.5 tablets on Wednesdays. Recheck INR in 6 weeks. Coumadin Clinic [phone].       

## 2022-03-29 ENCOUNTER — Ambulatory Visit (INDEPENDENT_AMBULATORY_CARE_PROVIDER_SITE_OTHER): Payer: Medicare HMO

## 2022-03-29 DIAGNOSIS — Z7901 Long term (current) use of anticoagulants: Secondary | ICD-10-CM

## 2022-03-29 DIAGNOSIS — I253 Aneurysm of heart: Secondary | ICD-10-CM | POA: Diagnosis not present

## 2022-03-29 LAB — POCT INR: INR: 1.7 — AB (ref 2.0–3.0)

## 2022-03-29 NOTE — Patient Instructions (Signed)
Description   Take 2 tablets today and then continue taking Warfarin 1 tablet daily except 1.5 tablets on Wednesdays. Recheck INR in 5 weeks. Coumadin Clinic [phone].

## 2022-04-05 ENCOUNTER — Ambulatory Visit (HOSPITAL_COMMUNITY)
Admission: RE | Admit: 2022-04-05 | Discharge: 2022-04-05 | Disposition: A | Discharge status: Home / Self Care | Payer: Medicare HMO | Source: Ambulatory Visit | Attending: Internal Medicine | Admitting: Internal Medicine

## 2022-04-05 ENCOUNTER — Encounter (HOSPITAL_COMMUNITY): Payer: Self-pay | Admitting: Internal Medicine

## 2022-04-05 ENCOUNTER — Telehealth (HOSPITAL_COMMUNITY): Payer: Self-pay | Admitting: Licensed Clinical Social Worker

## 2022-04-05 VITALS — BP 152/80 | HR 53 | Wt 104.6 lb

## 2022-04-05 DIAGNOSIS — Z720 Tobacco use: Secondary | ICD-10-CM | POA: Diagnosis not present

## 2022-04-05 DIAGNOSIS — I10 Essential (primary) hypertension: Secondary | ICD-10-CM

## 2022-04-05 DIAGNOSIS — I2510 Atherosclerotic heart disease of native coronary artery without angina pectoris: Secondary | ICD-10-CM | POA: Insufficient documentation

## 2022-04-05 DIAGNOSIS — I255 Ischemic cardiomyopathy: Secondary | ICD-10-CM | POA: Insufficient documentation

## 2022-04-05 DIAGNOSIS — Z7902 Long term (current) use of antithrombotics/antiplatelets: Secondary | ICD-10-CM | POA: Diagnosis not present

## 2022-04-05 DIAGNOSIS — I5022 Chronic systolic (congestive) heart failure: Secondary | ICD-10-CM | POA: Insufficient documentation

## 2022-04-05 DIAGNOSIS — Z87891 Personal history of nicotine dependence: Secondary | ICD-10-CM | POA: Diagnosis not present

## 2022-04-05 DIAGNOSIS — D472 Monoclonal gammopathy: Secondary | ICD-10-CM | POA: Diagnosis not present

## 2022-04-05 DIAGNOSIS — I253 Aneurysm of heart: Secondary | ICD-10-CM | POA: Insufficient documentation

## 2022-04-05 DIAGNOSIS — I110 Hypertensive heart disease with heart failure: Secondary | ICD-10-CM | POA: Diagnosis not present

## 2022-04-05 DIAGNOSIS — Z7901 Long term (current) use of anticoagulants: Secondary | ICD-10-CM | POA: Diagnosis not present

## 2022-04-05 DIAGNOSIS — Z79899 Other long term (current) drug therapy: Secondary | ICD-10-CM | POA: Diagnosis not present

## 2022-04-05 MED ORDER — AMLODIPINE BESYLATE 10 MG PO TABS: 10.0000 mg | ORAL_TABLET | Freq: Every day | ORAL | 3 refills | Status: DC

## 2022-04-05 NOTE — Progress Notes (Signed)
ADVANCED HEART FAILURE CLINIC NOTE  Patient ID: Faith Gray, female   DOB: 11-02-1940, 81 y.o.   MRN: [id number]  HF MD: Dr Haroldine Laws.  PCP: [facility] [place].   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    Admitted to [facility] 12/13 for new onset acute systolic HF. Echo showed EF 25-30% with diffuse hypokinesis. Mild MR.   Admitted to [facility] 6/14 for near syncope after taking hydralazine early and jumping up quickly. Work up negative. ECHO EF improved to 40-45%, Severe hypokinesis base inferolateral. Akinesis base/mid inferior wall. 30 day Event monitor placed 03/11/13. This showed no significant arrhythmias.  She returns for HF follow up. Overall feeling fine. SOB after walking 4-5 blocks. Says she takes her time. Mild SOB with steps. Denies /PND/Orthopnea.  SBP at home [phone] DBP 80-90s Appetite ok. No fever or chills. Weight at home has been stable. Taking all medications.  Cardiac Studies  Echo 11/01/20: EF 30-35%  large aneurysm at the base to mid inferior wall. Mild to moderate MR. Personally reviewed Echo 3/18: EF 30-35% large aneurysm at the base to mid inferior wall. Echo 02/01/18: EF 35% large aneurysm at the base to mid inferior wall.  Cath 08/2012:  Left main: Normal  LAD: Gives off 3 diagonals. Mild diffuse plaquing with 30-40% lesion in midsection. Otherwise normal.  LCX: Tiny ramus. Large OM-1 & OM-2. 30% lesion in ostial LCX. Otherwise mild plaque.  RCA: Dominant vessel with diffuse disease. Diffuse 30-40% through midsection. In distal RCA there is a ruptured plaque with about 60% stenosis. PDA moderate diffuse disease. In distal RCA just prior to take-off of PLs there is a very focal area of myocardial bridging with severe systolic compression.  LV-gram done in the RAO projection: Ejection fraction = 25-30%.  There is a large aneurysm at the base to mid inferior wall. Anterior wall mild HK. No significant MR noted.   Review of systems complete and found to be negative unless listed in HPI.    Past Medical History:  Diagnosis Date   Anemia    Arthritis    CAD (coronary artery disease) 09/11/2012   CHF (congestive heart failure) ([place])    Dental caries    Essential hypertension 10/04/2012   Ischemic cardiomyopathy    NSTEMI (non-ST elevated myocardial infarction) ([place]) 09/11/2012   Shortness of breath    Current Outpatient Medications  Medication Sig Dispense Refill   atorvastatin (LIPITOR) 80 MG tablet TAKE 1 TABLET BY MOUTH DAILY AT 6PM 90 tablet 1   carvedilol (COREG) 12.5 MG tablet Take 1.5 tablets (18.75 mg total) by mouth 2 (two) times daily with a meal. 270 tablet 3   ENTRESTO 97-103 MG TAKE 1 TABLET TWICE DAILY  (DOSE  CHANGE) 180 tablet 3   hydrALAZINE (APRESOLINE) 100 MG tablet TAKE 1 TABLET THREE TIMES DAILY (DOSE INCREASE) 270 tablet 2   isosorbide mononitrate (IMDUR) 60 MG 24 hr tablet TAKE 1 AND 1/2  TABLETS   DAILY. 135 tablet 3   JARDIANCE 10 MG TABS tablet TAKE 1 TABLET EVERY DAY 90 tablet 3   spironolactone (ALDACTONE) 25 MG tablet TAKE 1 TABLET EVERY DAY (DOSE INCREASE) 90 tablet 0   warfarin (COUMADIN) 6 MG tablet TAKE 1/2 TABLET TO 1 TABLET DAILY AS DIRECTED BY THE COUMADIN CLINIC 100 tablet 0   No current facility-administered  medications for this encounter.   Allergies  Allergen Reactions   Vicodin [Hydrocodone-Acetaminophen] Nausea And Vomiting    PHYSICAL EXAM: Vitals:   04/05/22 1148  BP: (!) 152/80  Pulse: (!) 53  SpO2: 98%  Weight: 47.4 kg (104 lb 9.6 oz)    Wt Readings from Last 3 Encounters:  04/05/22 47.4 kg (104 lb 9.6 oz)  05/20/21 48.8 kg (107 lb 9.6 oz)  02/21/21 50.3 kg (111 lb)   General:  Well appearing. No resp difficulty HEENT: normal Neck: supple. no JVD. Carotids 2+ bilat; no bruits. No lymphadenopathy or thryomegaly  appreciated. Cor: PMI nondisplaced. Regular rate & rhythm. No rubs, gallops or murmurs. Lungs: clear Abdomen: soft, nontender, nondistended. No hepatosplenomegaly. No bruits or masses. Good bowel sounds. Extremities: no cyanosis, clubbing, rash, edema Neuro: alert & orientedx3, cranial nerves grossly intact. moves all 4 extremities w/o difficulty. Affect pleasant   1) Chronic systolic HF:  -Ischemic cardiomyopathy. 2019:  EF ~35%  She has a large inferior wall aneurysm.  - Echo 11/01/20: EF 30-35%  large aneurysm at the base to mid inferior wall. Mild to moderate MR.  -   NYHA II-III - Continue Entresto 97/103 - Continue  hydralazine to 100 mg TID and imdur 60 mg daily.  - Continue spiro 22m - Continue jardiance 10 - Continue carvedilol to 18.761mBID - No room to increase. With bradycardia.  - Wants to continue to defer ICD for now   2) CAD:  - No s/s of angina  - Not on ASA with warfarin use.  - Continue atorvastatin and b-blocker  3) LV aneurysm:   - Felt to be high-risk for embolic phenomenon -> Continue coumadin. Dosing per coumadin clinic - No bleeding. CBC today  4) HTN -Remains elevated. Add 10 mg amlodipine daily.   5) Smoking No longer smoking. Congratulated.   6) MGUS Followed by Dr KhHumphrey Rolls Planning for bone marrow biopsy.   Follow up in 6 months with an Echo and Dr BeDonnamae JudeNP  04/05/2022   Patient seen and examined with the above-signed Advanced Practice Provider and/or Housestaff. I personally reviewed laboratory data, imaging studies and relevant notes. I independently examined the patient and formulated the important aspects of the plan. I have edited the note to reflect any of my changes or salient points. I have personally discussed the plan with the patient and/or family.  Doing well from cardiac standpoint. No CP or SOB. Taking meds. Has stopped smoking. Undergoing w/u for MGUS with Dr. KhHumphrey RollsBMBx pending   General:  Elderly. Thin. No  resp difficulty HEENT: normal Neck: supple. no JVD. Carotids 2+ bilat; no bruits. No lymphadenopathy or thryomegaly appreciated. Cor: PMI nondisplaced. Regular rate & rhythm. No rubs, gallops or murmurs. Lungs: clear but decreased throughout Abdomen: soft, nontender, nondistended. No hepatosplenomegaly. No bruits or masses. Good bowel sounds. Extremities: no cyanosis, clubbing, rash, edema Neuro: alert & orientedx3, cranial nerves grossly intact. moves all 4 extremities w/o difficulty. Affect pleasant  Stable from cardiac perspective. Continue current regimen. BP is high (partly white-coat). Will add amlodipine 10 daily. Remains uninterested in ICD. F/u 6 months with echo.   DaGlori BickersMD  4:08 PM

## 2022-04-05 NOTE — Patient Instructions (Signed)
Start Amlodipine '10mg'$  daily.  Your physician has requested that you have an echocardiogram. Echocardiography is a painless test that uses sound waves to create images of your heart. It provides your doctor with information about the size and shape of your heart and how well your heart's chambers and valves are working. This procedure takes approximately one hour. There are no restrictions for this procedure.  Your physician recommends that you schedule a follow-up appointment in: 6 months with echocardiogram ( January 2024)  ** please call the office in October to arrange your follow up appointment ** If you have any questions or concerns before your next appointment please send [place] a message through [place] or call our office at [phone].    TO LEAVE A MESSAGE FOR THE NURSE SELECT OPTION 2, PLEASE LEAVE A MESSAGE INCLUDING: YOUR NAME DATE OF BIRTH CALL BACK NUMBER REASON FOR CALL**this is important as we prioritize the call backs  YOU WILL RECEIVE A CALL BACK THE SAME DAY AS LONG AS YOU CALL BEFORE 4:00 PM  At the [facility], you and your health needs are our priority. As part of our continuing mission to provide you with exceptional heart care, we have created designated Provider Care Teams. These Care Teams include your primary Cardiologist (physician) and Advanced Practice Providers (APPs- Physician Assistants and Nurse Practitioners) who all work together to provide you with the care you need, when you need it.   You may see any of the following providers on your designated Care Team at your next follow up: Dr [name] Dr Haynes Kerns, NP Lyda Jester, [place] [facility] [place], [place] Audry Riles, PharmD   Please be sure to bring in all your medications bottles to every appointment.

## 2022-04-05 NOTE — Telephone Encounter (Signed)
Pt called clinic and informed that her Medicaid transport that she had arranged went to the wrong address and that she had no other way to come to the clinic for her appt.  CSW able to arrange taxi to get pt to clinic  Jorge Ny, [place] Worker [facility] Desk#: [phone] Cell#: [phone]

## 2022-04-12 ENCOUNTER — Other Ambulatory Visit (HOSPITAL_COMMUNITY): Payer: Self-pay | Admitting: Internal Medicine

## 2022-04-12 DIAGNOSIS — I253 Aneurysm of heart: Secondary | ICD-10-CM

## 2022-04-12 NOTE — Telephone Encounter (Signed)
Prescription refill request received for warfarin Lov: 04/05/22 (Bensimhon) Next INR check: 05/03/22 Warfarin tablet strength: '6mg'$   Appropriate dose and refill sent to requested pharmacy.

## 2022-04-17 ENCOUNTER — Other Ambulatory Visit (HOSPITAL_COMMUNITY): Payer: Self-pay | Admitting: Internal Medicine

## 2022-05-10 ENCOUNTER — Ambulatory Visit (INDEPENDENT_AMBULATORY_CARE_PROVIDER_SITE_OTHER): Payer: Medicare HMO

## 2022-05-10 DIAGNOSIS — I253 Aneurysm of heart: Secondary | ICD-10-CM

## 2022-05-10 DIAGNOSIS — Z7901 Long term (current) use of anticoagulants: Secondary | ICD-10-CM | POA: Diagnosis not present

## 2022-05-10 LAB — POCT INR: INR: 2.8 (ref 2.0–3.0)

## 2022-05-10 NOTE — Patient Instructions (Signed)
Description   Continue taking Warfarin 1 tablet daily except 1.5 tablets on Wednesdays. Recheck INR in 6 weeks. Coumadin Clinic [phone].

## 2022-05-13 ENCOUNTER — Other Ambulatory Visit (HOSPITAL_COMMUNITY): Payer: Self-pay | Admitting: Internal Medicine

## 2022-06-21 ENCOUNTER — Ambulatory Visit: Payer: Medicare HMO

## 2022-06-26 ENCOUNTER — Ambulatory Visit: Payer: Medicare Other | Attending: Internal Medicine | Admitting: Student

## 2022-06-26 DIAGNOSIS — Z7901 Long term (current) use of anticoagulants: Secondary | ICD-10-CM | POA: Diagnosis not present

## 2022-06-26 DIAGNOSIS — I253 Aneurysm of heart: Secondary | ICD-10-CM | POA: Diagnosis not present

## 2022-06-26 LAB — POCT INR: INR: 1.7 — AB (ref 2.0–3.0)

## 2022-06-26 NOTE — Patient Instructions (Signed)
Start taking Warfarin 1 tablet daily except 1.5 tablets on Mondays and Wednesdays. Recheck INR in 3 weeks. Coumadin Clinic [phone].

## 2022-07-17 ENCOUNTER — Ambulatory Visit: Payer: Medicare Other | Attending: Internal Medicine | Admitting: *Deleted

## 2022-07-17 DIAGNOSIS — I253 Aneurysm of heart: Secondary | ICD-10-CM | POA: Diagnosis not present

## 2022-07-17 DIAGNOSIS — Z7901 Long term (current) use of anticoagulants: Secondary | ICD-10-CM | POA: Diagnosis not present

## 2022-07-17 LAB — POCT INR: INR: 1.2 — AB (ref 2.0–3.0)

## 2022-07-17 NOTE — Patient Instructions (Addendum)
Description   Today take 1.5 tablets and tomorrow take 1.5 tablets then start taking the dose you should be taking, which is, Warfarin 1 tablet daily except 1.5 tablets on Mondays and Thursdays. Recheck INR in 1 week-please adhere to appointment. Coumadin Clinic [phone] or [phone]

## 2022-07-26 ENCOUNTER — Ambulatory Visit: Payer: Medicare Other | Attending: Internal Medicine

## 2022-07-26 DIAGNOSIS — I253 Aneurysm of heart: Secondary | ICD-10-CM

## 2022-07-26 DIAGNOSIS — Z7901 Long term (current) use of anticoagulants: Secondary | ICD-10-CM | POA: Diagnosis not present

## 2022-07-26 LAB — POCT INR: INR: 2.8 (ref 2.0–3.0)

## 2022-07-26 NOTE — Patient Instructions (Signed)
Description   Continue taking Warfarin 1 tablet daily except 1.5 tablets on Mondays and Thursdays.  Recheck INR in 3 weeks-please adhere to appointment.  Coumadin Clinic [phone]

## 2022-08-13 ENCOUNTER — Other Ambulatory Visit (HOSPITAL_COMMUNITY): Payer: Self-pay | Admitting: Internal Medicine

## 2022-08-16 ENCOUNTER — Ambulatory Visit: Payer: Medicare Other | Attending: Internal Medicine

## 2022-08-16 DIAGNOSIS — Z7901 Long term (current) use of anticoagulants: Secondary | ICD-10-CM | POA: Diagnosis not present

## 2022-08-16 DIAGNOSIS — I253 Aneurysm of heart: Secondary | ICD-10-CM | POA: Diagnosis not present

## 2022-08-16 LAB — POCT INR: INR: 2.7 (ref 2.0–3.0)

## 2022-08-16 NOTE — Patient Instructions (Signed)
Description   Continue taking Warfarin 1 tablet daily except 1.5 tablets on Mondays and Thursdays.  Recheck INR in 5 weeks-please adhere to appointment.  Coumadin Clinic [phone]

## 2022-09-20 ENCOUNTER — Ambulatory Visit: Payer: Medicare HMO | Attending: Internal Medicine

## 2022-09-28 ENCOUNTER — Ambulatory Visit: Payer: Medicare HMO | Attending: Internal Medicine | Admitting: Pharmacist Clinician (PhC)/ Clinical Pharmacy Specialist

## 2022-09-28 DIAGNOSIS — Z7901 Long term (current) use of anticoagulants: Secondary | ICD-10-CM

## 2022-09-28 DIAGNOSIS — I253 Aneurysm of heart: Secondary | ICD-10-CM | POA: Diagnosis not present

## 2022-09-28 LAB — POCT INR: INR: 1.5 — AB (ref 2.0–3.0)

## 2022-09-28 NOTE — Patient Instructions (Signed)
Take 2 tablets today Thursday Jan 11, then 1.5 tablets Friday Jan 12.  Then continue taking Warfarin 1 tablet daily except 1.5 tablets on Mondays and Thursdays.  Recheck INR in 2 weeks-please adhere to appointment.  Coumadin Clinic [phone]

## 2022-10-12 ENCOUNTER — Ambulatory Visit: Payer: Medicare HMO

## 2022-10-18 ENCOUNTER — Ambulatory Visit: Payer: Medicare HMO

## 2022-10-26 ENCOUNTER — Ambulatory Visit: Payer: Medicare HMO | Attending: Cardiology

## 2022-10-26 DIAGNOSIS — Z7901 Long term (current) use of anticoagulants: Secondary | ICD-10-CM | POA: Diagnosis not present

## 2022-10-26 DIAGNOSIS — I253 Aneurysm of heart: Secondary | ICD-10-CM

## 2022-10-26 LAB — POCT INR: INR: 2 (ref 2.0–3.0)

## 2022-10-26 NOTE — Patient Instructions (Signed)
Description   Take 2 tablets today and then continue taking Warfarin 1 tablet daily except 1.5 tablets on Mondays and Thursdays.  Recheck INR in 3 weeks-please adhere to appointment.  Coumadin Clinic [phone]

## 2022-11-16 ENCOUNTER — Ambulatory Visit: Payer: Medicare HMO | Attending: Internal Medicine | Admitting: *Deleted

## 2022-11-16 DIAGNOSIS — I253 Aneurysm of heart: Secondary | ICD-10-CM | POA: Diagnosis not present

## 2022-11-16 DIAGNOSIS — Z7901 Long term (current) use of anticoagulants: Secondary | ICD-10-CM | POA: Diagnosis not present

## 2022-11-16 LAB — POCT INR: INR: 1.7 — AB (ref 2.0–3.0)

## 2022-11-16 NOTE — Patient Instructions (Signed)
Description   Take 2 tablets today and then START taking Warfarin 1 tablet daily except 1.5 tablets on Mondays, Thursdays, and Saturdays.  Recheck INR in 3 weeks-please adhere to appointment.  Coumadin Clinic [phone]

## 2022-12-07 ENCOUNTER — Ambulatory Visit: Payer: Medicare HMO

## 2022-12-14 ENCOUNTER — Ambulatory Visit: Payer: Medicare HMO | Attending: Cardiology | Admitting: *Deleted

## 2022-12-14 DIAGNOSIS — Z7901 Long term (current) use of anticoagulants: Secondary | ICD-10-CM

## 2022-12-14 DIAGNOSIS — I253 Aneurysm of heart: Secondary | ICD-10-CM

## 2022-12-14 LAB — POCT INR: POC INR: 2

## 2022-12-14 NOTE — Patient Instructions (Signed)
Description   Continue taking Warfarin 1 tablet daily except 1.5 tablets on Mondays, Thursdays, and Saturdays.  Recheck INR in 4 weeks-please adhere to appointment.  Coumadin Clinic [phone]

## 2023-01-07 ENCOUNTER — Emergency Department (HOSPITAL_COMMUNITY): Payer: Medicare HMO

## 2023-01-07 ENCOUNTER — Encounter (HOSPITAL_COMMUNITY): Payer: Self-pay | Admitting: Emergency Medicine

## 2023-01-07 ENCOUNTER — Other Ambulatory Visit: Payer: Self-pay

## 2023-01-07 ENCOUNTER — Inpatient Hospital Stay (HOSPITAL_COMMUNITY)
Admission: EM | Admit: 2023-01-07 | Discharge: 2023-01-09 | DRG: 065 | Disposition: A | Discharge status: Home / Self Care | Payer: Medicare HMO | Attending: Family Medicine | Admitting: Family Medicine

## 2023-01-07 DIAGNOSIS — I5020 Unspecified systolic (congestive) heart failure: Secondary | ICD-10-CM | POA: Diagnosis present

## 2023-01-07 DIAGNOSIS — H532 Diplopia: Secondary | ICD-10-CM | POA: Diagnosis present

## 2023-01-07 DIAGNOSIS — I10 Essential (primary) hypertension: Secondary | ICD-10-CM | POA: Diagnosis present

## 2023-01-07 DIAGNOSIS — E875 Hyperkalemia: Secondary | ICD-10-CM | POA: Diagnosis present

## 2023-01-07 DIAGNOSIS — I253 Aneurysm of heart: Secondary | ICD-10-CM | POA: Diagnosis present

## 2023-01-07 DIAGNOSIS — I6381 Other cerebral infarction due to occlusion or stenosis of small artery: Secondary | ICD-10-CM | POA: Diagnosis not present

## 2023-01-07 DIAGNOSIS — H4921 Sixth [abducent] nerve palsy, right eye: Secondary | ICD-10-CM | POA: Diagnosis present

## 2023-01-07 DIAGNOSIS — R791 Abnormal coagulation profile: Secondary | ICD-10-CM | POA: Diagnosis present

## 2023-01-07 DIAGNOSIS — Z7984 Long term (current) use of oral hypoglycemic drugs: Secondary | ICD-10-CM

## 2023-01-07 DIAGNOSIS — E1122 Type 2 diabetes mellitus with diabetic chronic kidney disease: Secondary | ICD-10-CM | POA: Diagnosis present

## 2023-01-07 DIAGNOSIS — I255 Ischemic cardiomyopathy: Secondary | ICD-10-CM | POA: Diagnosis present

## 2023-01-07 DIAGNOSIS — Z885 Allergy status to narcotic agent status: Secondary | ICD-10-CM

## 2023-01-07 DIAGNOSIS — F17210 Nicotine dependence, cigarettes, uncomplicated: Secondary | ICD-10-CM | POA: Diagnosis present

## 2023-01-07 DIAGNOSIS — R29700 NIHSS score 0: Secondary | ICD-10-CM | POA: Diagnosis present

## 2023-01-07 DIAGNOSIS — H538 Other visual disturbances: Secondary | ICD-10-CM | POA: Diagnosis present

## 2023-01-07 DIAGNOSIS — Z832 Family history of diseases of the blood and blood-forming organs and certain disorders involving the immune mechanism: Secondary | ICD-10-CM

## 2023-01-07 DIAGNOSIS — D472 Monoclonal gammopathy: Secondary | ICD-10-CM | POA: Diagnosis present

## 2023-01-07 DIAGNOSIS — I130 Hypertensive heart and chronic kidney disease with heart failure and stage 1 through stage 4 chronic kidney disease, or unspecified chronic kidney disease: Secondary | ICD-10-CM | POA: Diagnosis present

## 2023-01-07 DIAGNOSIS — E785 Hyperlipidemia, unspecified: Secondary | ICD-10-CM | POA: Diagnosis present

## 2023-01-07 DIAGNOSIS — D649 Anemia, unspecified: Secondary | ICD-10-CM | POA: Diagnosis present

## 2023-01-07 DIAGNOSIS — M5430 Sciatica, unspecified side: Secondary | ICD-10-CM | POA: Diagnosis present

## 2023-01-07 DIAGNOSIS — Z79899 Other long term (current) drug therapy: Secondary | ICD-10-CM

## 2023-01-07 DIAGNOSIS — Z7901 Long term (current) use of anticoagulants: Secondary | ICD-10-CM

## 2023-01-07 DIAGNOSIS — Z8679 Personal history of other diseases of the circulatory system: Secondary | ICD-10-CM

## 2023-01-07 DIAGNOSIS — I5022 Chronic systolic (congestive) heart failure: Secondary | ICD-10-CM | POA: Diagnosis present

## 2023-01-07 DIAGNOSIS — N1832 Chronic kidney disease, stage 3b: Secondary | ICD-10-CM | POA: Diagnosis present

## 2023-01-07 DIAGNOSIS — I2510 Atherosclerotic heart disease of native coronary artery without angina pectoris: Secondary | ICD-10-CM | POA: Diagnosis present

## 2023-01-07 DIAGNOSIS — I252 Old myocardial infarction: Secondary | ICD-10-CM

## 2023-01-07 LAB — DIFFERENTIAL
Abs Immature Granulocytes: 0.01 10*3/uL (ref 0.00–0.07)
Basophils Absolute: 0 10*3/uL (ref 0.0–0.1)
Basophils Relative: 1 %
Eosinophils Absolute: 0.1 10*3/uL (ref 0.0–0.5)
Eosinophils Relative: 2 %
Immature Granulocytes: 0 %
Lymphocytes Relative: 54 %
Lymphs Abs: 2.1 10*3/uL (ref 0.7–4.0)
Monocytes Absolute: 0.5 10*3/uL (ref 0.1–1.0)
Monocytes Relative: 11 %
Neutro Abs: 1.3 10*3/uL — ABNORMAL LOW (ref 1.7–7.7)
Neutrophils Relative %: 32 %

## 2023-01-07 LAB — I-STAT CHEM 8, ED
BUN: 36 mg/dL — ABNORMAL HIGH (ref 8–23)
Calcium, Ion: 1.01 mmol/L — ABNORMAL LOW (ref 1.15–1.40)
Chloride: 108 mmol/L (ref 98–111)
Creatinine, Ser: 1.6 mg/dL — ABNORMAL HIGH (ref 0.44–1.00)
Glucose, Bld: 73 mg/dL (ref 70–99)
HCT: 26 % — ABNORMAL LOW (ref 36.0–46.0)
Hemoglobin: 8.8 g/dL — ABNORMAL LOW (ref 12.0–15.0)
Potassium: 5.2 mmol/L — ABNORMAL HIGH (ref 3.5–5.1)
Sodium: 137 mmol/L (ref 135–145)
TCO2: 23 mmol/L (ref 22–32)

## 2023-01-07 LAB — COMPREHENSIVE METABOLIC PANEL
ALT: 30 U/L (ref 0–44)
AST: 31 U/L (ref 15–41)
Albumin: 3.9 g/dL (ref 3.5–5.0)
Alkaline Phosphatase: 104 U/L (ref 38–126)
Anion gap: 13 (ref 5–15)
BUN: 29 mg/dL — ABNORMAL HIGH (ref 8–23)
CO2: 22 mmol/L (ref 22–32)
Calcium: 8.6 mg/dL — ABNORMAL LOW (ref 8.9–10.3)
Chloride: 102 mmol/L (ref 98–111)
Creatinine, Ser: 1.6 mg/dL — ABNORMAL HIGH (ref 0.44–1.00)
GFR, Estimated: 32 mL/min — ABNORMAL LOW (ref >60)
Glucose, Bld: 84 mg/dL (ref 70–99)
Potassium: 4.2 mmol/L (ref 3.5–5.1)
Sodium: 137 mmol/L (ref 135–145)
Total Bilirubin: 0.6 mg/dL (ref 0.3–1.2)
Total Protein: 8.6 g/dL — ABNORMAL HIGH (ref 6.5–8.1)

## 2023-01-07 LAB — URINALYSIS, ROUTINE W REFLEX MICROSCOPIC
Bacteria, UA: NONE SEEN
Bilirubin Urine: NEGATIVE
Glucose, UA: NEGATIVE mg/dL
Ketones, ur: NEGATIVE mg/dL
Leukocytes,Ua: NEGATIVE
Nitrite: NEGATIVE
Protein, ur: NEGATIVE mg/dL
Specific Gravity, Urine: 1.006 (ref 1.005–1.030)
pH: 6 (ref 5.0–8.0)

## 2023-01-07 LAB — CBC
HCT: 28.5 % — ABNORMAL LOW (ref 36.0–46.0)
Hemoglobin: 9.1 g/dL — ABNORMAL LOW (ref 12.0–15.0)
MCH: 28.1 pg (ref 26.0–34.0)
MCHC: 31.9 g/dL (ref 30.0–36.0)
MCV: 88 fL (ref 80.0–100.0)
Platelets: 328 10*3/uL (ref 150–400)
RBC: 3.24 MIL/uL — ABNORMAL LOW (ref 3.87–5.11)
RDW: 13.9 % (ref 11.5–15.5)
WBC: 4 10*3/uL (ref 4.0–10.5)
nRBC: 0 % (ref 0.0–0.2)

## 2023-01-07 LAB — RAPID URINE DRUG SCREEN, HOSP PERFORMED
Amphetamines: NOT DETECTED
Barbiturates: NOT DETECTED
Benzodiazepines: NOT DETECTED
Cocaine: NOT DETECTED
Opiates: NOT DETECTED
Tetrahydrocannabinol: POSITIVE — AB

## 2023-01-07 LAB — PROTIME-INR
INR: 1.4 — ABNORMAL HIGH (ref 0.8–1.2)
Prothrombin Time: 17.3 seconds — ABNORMAL HIGH (ref 11.4–15.2)

## 2023-01-07 LAB — APTT: aPTT: 42 seconds — ABNORMAL HIGH (ref 24–36)

## 2023-01-07 LAB — ETHANOL: Alcohol, Ethyl (B): <10 mg/dL (ref <10)

## 2023-01-07 MED ORDER — LORAZEPAM 1 MG PO TABS
0.5000 mg | ORAL_TABLET | Freq: Once | ORAL | Status: AC
  Administered 2023-01-08: 0.5 mg via ORAL
  Filled 2023-01-07: qty 1

## 2023-01-07 MED ORDER — LORAZEPAM 0.5 MG PO TABS: 0.5000 mg | ORAL_TABLET | Freq: Once | ORAL | Status: DC | PRN

## 2023-01-07 NOTE — ED Triage Notes (Signed)
Pt presents from home for blurred vision (L>R) since Thurs., constant blurring. Denies weakness or speech difficulties. A&Ox4  BP has been slightly elevated 170SBP at home. Today 188/93 in triage.  Emesis on Wed. Denies fever, chills H/o CHF

## 2023-01-07 NOTE — Discharge Instructions (Signed)
Follow up with the neurologist and pcp in the office.   Information on my medicine - ELIQUIS (apixaban)  Why was Eliquis prescribed for you? Eliquis was prescribed for you to reduce the risk of a blood clot forming that can cause a stroke if you have a medical condition called atrial fibrillation (a type of irregular heartbeat).  What do You need to know about Eliquis ? Take your Eliquis TWICE DAILY - one tablet in the morning and one tablet in the evening with or without food. If you have difficulty swallowing the tablet whole please discuss with your pharmacist how to take the medication safely.  Take Eliquis exactly as prescribed by your doctor and DO NOT stop taking Eliquis without talking to the doctor who prescribed the medication.  Stopping may increase your risk of developing a stroke.  Refill your prescription before you run out.  After discharge, you should have regular check-up appointments with your healthcare provider that is prescribing your Eliquis.  In the future your dose may need to be changed if your kidney function or weight changes by a significant amount or as you get older.  What do you do if you miss a dose? If you miss a dose, take it as soon as you remember on the same day and resume taking twice daily.  Do not take more than one dose of ELIQUIS at the same time to make up a missed dose.  Important Safety Information A possible side effect of Eliquis is bleeding. You should call your healthcare provider right away if you experience any of the following: Bleeding from an injury or your nose that does not stop. Unusual colored urine (red or dark brown) or unusual colored stools (red or black). Unusual bruising for unknown reasons. A serious fall or if you hit your head (even if there is no bleeding).  Some medicines may interact with Eliquis and might increase your risk of bleeding or clotting while on Eliquis. To help avoid this, consult your healthcare  provider or pharmacist prior to using any new prescription or non-prescription medications, including herbals, vitamins, non-steroidal anti-inflammatory drugs (NSAIDs) and supplements.  This website has more information on Eliquis (apixaban): http://www.eliquis.com/eliquis/home

## 2023-01-07 NOTE — ED Provider Notes (Signed)
[place] EMERGENCY DEPARTMENT AT [facility] Provider Note   CSN: [id number] Arrival date & time: 01/07/23  1919     History {Add pertinent medical, surgical, social history, OB history to HPI:1} No chief complaint on file.   Faith Gray is a 82 y.o. female.  82 yo F with a chief complaint of double vision.  This been going on since Thursday.  She thinks it may be due to Lyrica.  She was having some issues with her back and had seen her doctor and they had recommended starting her on this medication.  Shortly thereafter is when she noticed the symptoms.  She think she can see fine with either eye but when she looks together she sees double.  She sees them double side-by-side.  She denies specific pain to the eye.  Denies trauma to the head or the neck.  Denies headache.  Denies one-sided numbness or weakness denies difficulty with speech or swallowing.        Home Medications Prior to Admission medications   Medication Sig Start Date End Date Taking? Authorizing Provider  amLODipine (NORVASC) 10 MG tablet Take 1 tablet (10 mg total) by mouth daily. 04/05/22   Bensimhon, Bevelyn Buckles, MD  atorvastatin (LIPITOR) 80 MG tablet TAKE 1 TABLET BY MOUTH DAILY AT [facility] 05/19/19   Bensimhon, Bevelyn Buckles, MD  carvedilol (COREG) 12.5 MG tablet TAKE 1 AND 1/2 TABLETS TWO TIMES DAILY WITH MEALS. 05/15/22   Bensimhon, Bevelyn Buckles, MD  ENTRESTO 97-103 MG TAKE 1 TABLET TWICE DAILY  (DOSE  CHANGE) 11/24/21   Bensimhon, Bevelyn Buckles, MD  hydrALAZINE (APRESOLINE) 100 MG tablet TAKE 1 TABLET THREE TIMES DAILY ( DOSE INCREASE ) 08/14/22   Bensimhon, Bevelyn Buckles, MD  isosorbide mononitrate (IMDUR) 60 MG 24 hr tablet TAKE 1 AND 1/2  TABLETS   DAILY. 09/26/21   Bensimhon, Bevelyn Buckles, MD  JARDIANCE 10 MG TABS tablet TAKE 1 TABLET EVERY DAY 09/26/21   Bensimhon, Bevelyn Buckles, MD  spironolactone (ALDACTONE) 25 MG tablet TAKE 1 TABLET EVERY DAY 04/17/22   Bensimhon, Bevelyn Buckles, MD  warfarin (COUMADIN) 6 MG tablet TAKE 1 TABLET TO 1 &  1/2 TABLETS DAILY AS DIRECTED BY THE COUMADIN CLINIC 04/12/22   Bensimhon, Bevelyn Buckles, MD  furosemide (LASIX) 40 MG tablet Take 1 tablet (40 mg total) by mouth as needed. 02/05/18 04/13/20  Bensimhon, Bevelyn Buckles, MD      Allergies    Vicodin [hydrocodone-acetaminophen]    Review of Systems   Review of Systems  Physical Exam Updated Vital Signs BP (!) 188/93 (BP Location: Right Arm)   Pulse 85   Temp 97.7 F (36.5 C) (Oral)   Resp 16   Wt 49.4 kg   SpO2 95%   BMI 21.29 kg/m  Physical Exam Vitals and nursing note reviewed.  Constitutional:      General: She is not in acute distress.    Appearance: She is well-developed. She is not diaphoretic.  HENT:     Head: Normocephalic and atraumatic.  Eyes:     Pupils: Pupils are equal, round, and reactive to light.  Cardiovascular:     Rate and Rhythm: Normal rate and regular rhythm.     Heart sounds: No murmur heard.    No friction rub. No gallop.  Pulmonary:     Effort: Pulmonary effort is normal.     Breath sounds: No wheezing or rales.  Abdominal:     General: There is no distension.  Palpations: Abdomen is soft.     Tenderness: There is no abdominal tenderness.  Musculoskeletal:        General: No tenderness.     Cervical back: Normal range of motion and neck supple.  Skin:    General: Skin is warm and dry.  Neurological:     Mental Status: She is alert and oriented to person, place, and time.     Cranial Nerves: Cranial nerve deficit present.     Sensory: Sensation is intact.     Motor: Motor function is intact.     Coordination: Coordination is intact.     Gait: Gait is intact.     Comments: Patient is unable to look rightward with the right eye.   Otherwise no obvious focal findings on neurologic exam.  Psychiatric:        Behavior: Behavior normal.     ED Results / Procedures / Treatments   Labs (all labs ordered are listed, but only abnormal results are displayed) Labs Reviewed  ETHANOL  PROTIME-INR  APTT   CBC  DIFFERENTIAL  COMPREHENSIVE METABOLIC PANEL  RAPID URINE DRUG SCREEN, HOSP PERFORMED  URINALYSIS, ROUTINE W REFLEX MICROSCOPIC  I-STAT CHEM 8, ED    EKG None  Radiology No results found.  Procedures Procedures  {Document cardiac monitor, telemetry assessment procedure when appropriate:1}  Medications Ordered in ED Medications - No data to display  ED Course/ Medical Decision Making/ A&P   {   Click here for ABCD2, HEART and other calculatorsREFRESH Note before signing :1}                          Medical Decision Making Amount and/or Complexity of Data Reviewed Labs: ordered. Radiology: ordered.  Risk Prescription drug management.   82 yo F with with a chief complaints of diplopia.  Going on for about 3 days.  She does have a cranial nerve VI palsy on exam.  Will obtain a CT of the head.  Blood work.  Discussed with neurology.  I discussed the case with Dr. Wilford Corner, neurology recommended an MRI of the brain.  If this is negative he recommended outpatient neurology follow-up.  Blood work without significant finding.  CT of the head without obvious acute intracranial pathology.  Unfortunately we are unable to get in touch with the MRI tech here at [facility] long.  No one is actually listed and amnion.  Calls placed with radiology techs also unable to get in touch with the MRI tech or find out who actually is on-call.  At this point we will transfer ED to ED to [facility] for MRI.  Discussed with Dr. Jacqulyn Bath who accepts in transfer.  The patients results and plan were reviewed and discussed.   Any x-rays performed were independently reviewed by myself.   Differential diagnosis were considered with the presenting HPI.  Medications  LORazepam (ATIVAN) tablet 0.5 mg (has no administration in time range)    Vitals:   01/07/23 1933 01/07/23 2007 01/07/23 2130 01/07/23 2200  BP: (!) 188/93 (!) 158/91 (!) 155/101 (!) 165/92  Pulse: 85 63 (!) 55 (!) 54  Resp: Temp: 97.7 F (36.5 C)     TempSrc: Oral     SpO2: 95% 99% 100% 100%  Weight:        Final diagnoses:  Cranial nerve VI palsy, right       {Document critical care time when appropriate:1} {Document review  of labs and clinical decision tools ie heart score, Chads2Vasc2 etc:1}  {Document your independent review of radiology images, and any outside records:1} {Document your discussion with family members, caretakers, and with consultants:1} {Document social determinants of health affecting pt's care:1} {Document your decision making why or why not admission, treatments were needed:1} Final Clinical Impression(s) / ED Diagnoses Final diagnoses:  None    Rx / DC Orders ED Discharge Orders     None

## 2023-01-07 NOTE — ED Notes (Signed)
Patient given instructions to go to [facility] ED and to notify of transfer. Report given to [employer] at [facility]. Patient taken out of ED via wheelchair.

## 2023-01-08 ENCOUNTER — Observation Stay (HOSPITAL_COMMUNITY): Payer: Medicare HMO

## 2023-01-08 ENCOUNTER — Other Ambulatory Visit (HOSPITAL_COMMUNITY): Payer: Medicare HMO

## 2023-01-08 ENCOUNTER — Emergency Department (HOSPITAL_COMMUNITY): Payer: Medicare HMO

## 2023-01-08 DIAGNOSIS — H4921 Sixth [abducent] nerve palsy, right eye: Secondary | ICD-10-CM | POA: Diagnosis present

## 2023-01-08 DIAGNOSIS — I5020 Unspecified systolic (congestive) heart failure: Secondary | ICD-10-CM

## 2023-01-08 DIAGNOSIS — Z79899 Other long term (current) drug therapy: Secondary | ICD-10-CM | POA: Diagnosis not present

## 2023-01-08 DIAGNOSIS — H532 Diplopia: Secondary | ICD-10-CM | POA: Diagnosis present

## 2023-01-08 DIAGNOSIS — M5430 Sciatica, unspecified side: Secondary | ICD-10-CM | POA: Diagnosis present

## 2023-01-08 DIAGNOSIS — R29700 NIHSS score 0: Secondary | ICD-10-CM | POA: Diagnosis present

## 2023-01-08 DIAGNOSIS — N1832 Chronic kidney disease, stage 3b: Secondary | ICD-10-CM | POA: Diagnosis present

## 2023-01-08 DIAGNOSIS — E785 Hyperlipidemia, unspecified: Secondary | ICD-10-CM | POA: Diagnosis present

## 2023-01-08 DIAGNOSIS — I5022 Chronic systolic (congestive) heart failure: Secondary | ICD-10-CM | POA: Diagnosis present

## 2023-01-08 DIAGNOSIS — D472 Monoclonal gammopathy: Secondary | ICD-10-CM | POA: Diagnosis present

## 2023-01-08 DIAGNOSIS — I25119 Atherosclerotic heart disease of native coronary artery with unspecified angina pectoris: Secondary | ICD-10-CM

## 2023-01-08 DIAGNOSIS — I253 Aneurysm of heart: Secondary | ICD-10-CM | POA: Diagnosis present

## 2023-01-08 DIAGNOSIS — I6389 Other cerebral infarction: Secondary | ICD-10-CM

## 2023-01-08 DIAGNOSIS — E875 Hyperkalemia: Secondary | ICD-10-CM | POA: Diagnosis present

## 2023-01-08 DIAGNOSIS — I10 Essential (primary) hypertension: Secondary | ICD-10-CM

## 2023-01-08 DIAGNOSIS — I255 Ischemic cardiomyopathy: Secondary | ICD-10-CM | POA: Diagnosis present

## 2023-01-08 DIAGNOSIS — Z7984 Long term (current) use of oral hypoglycemic drugs: Secondary | ICD-10-CM | POA: Diagnosis not present

## 2023-01-08 DIAGNOSIS — Z7901 Long term (current) use of anticoagulants: Secondary | ICD-10-CM | POA: Diagnosis not present

## 2023-01-08 DIAGNOSIS — D649 Anemia, unspecified: Secondary | ICD-10-CM

## 2023-01-08 DIAGNOSIS — H538 Other visual disturbances: Secondary | ICD-10-CM | POA: Diagnosis present

## 2023-01-08 DIAGNOSIS — E1122 Type 2 diabetes mellitus with diabetic chronic kidney disease: Secondary | ICD-10-CM | POA: Diagnosis present

## 2023-01-08 DIAGNOSIS — G459 Transient cerebral ischemic attack, unspecified: Secondary | ICD-10-CM | POA: Diagnosis not present

## 2023-01-08 DIAGNOSIS — I2510 Atherosclerotic heart disease of native coronary artery without angina pectoris: Secondary | ICD-10-CM | POA: Diagnosis present

## 2023-01-08 DIAGNOSIS — I252 Old myocardial infarction: Secondary | ICD-10-CM | POA: Diagnosis not present

## 2023-01-08 DIAGNOSIS — Z8673 Personal history of transient ischemic attack (TIA), and cerebral infarction without residual deficits: Secondary | ICD-10-CM | POA: Insufficient documentation

## 2023-01-08 DIAGNOSIS — R791 Abnormal coagulation profile: Secondary | ICD-10-CM | POA: Diagnosis present

## 2023-01-08 DIAGNOSIS — I6381 Other cerebral infarction due to occlusion or stenosis of small artery: Secondary | ICD-10-CM | POA: Diagnosis present

## 2023-01-08 DIAGNOSIS — Z832 Family history of diseases of the blood and blood-forming organs and certain disorders involving the immune mechanism: Secondary | ICD-10-CM | POA: Diagnosis not present

## 2023-01-08 DIAGNOSIS — F17210 Nicotine dependence, cigarettes, uncomplicated: Secondary | ICD-10-CM | POA: Diagnosis present

## 2023-01-08 DIAGNOSIS — Z885 Allergy status to narcotic agent status: Secondary | ICD-10-CM | POA: Diagnosis not present

## 2023-01-08 DIAGNOSIS — I130 Hypertensive heart and chronic kidney disease with heart failure and stage 1 through stage 4 chronic kidney disease, or unspecified chronic kidney disease: Secondary | ICD-10-CM | POA: Diagnosis present

## 2023-01-08 LAB — LIPID PANEL
Cholesterol: 204 mg/dL — ABNORMAL HIGH (ref 0–200)
HDL: 84 mg/dL (ref >40)
LDL Cholesterol: 112 mg/dL — ABNORMAL HIGH (ref 0–99)
Total CHOL/HDL Ratio: 2.4 RATIO
Triglycerides: 41 mg/dL (ref <150)
VLDL: 8 mg/dL (ref 0–40)

## 2023-01-08 LAB — BASIC METABOLIC PANEL
Anion gap: 13 (ref 5–15)
BUN: 22 mg/dL (ref 8–23)
CO2: 20 mmol/L — ABNORMAL LOW (ref 22–32)
Calcium: 9.2 mg/dL (ref 8.9–10.3)
Chloride: 100 mmol/L (ref 98–111)
Creatinine, Ser: 1.45 mg/dL — ABNORMAL HIGH (ref 0.44–1.00)
GFR, Estimated: 36 mL/min — ABNORMAL LOW (ref >60)
Glucose, Bld: 80 mg/dL (ref 70–99)
Potassium: 4.2 mmol/L (ref 3.5–5.1)
Sodium: 133 mmol/L — ABNORMAL LOW (ref 135–145)

## 2023-01-08 LAB — PROTIME-INR
INR: 1.1 (ref 0.8–1.2)
Prothrombin Time: 14.4 seconds (ref 11.4–15.2)

## 2023-01-08 LAB — HEMOGLOBIN A1C
Hgb A1c MFr Bld: 4.7 % — ABNORMAL LOW (ref 4.8–5.6)
Mean Plasma Glucose: 88.19 mg/dL

## 2023-01-08 LAB — HEPARIN LEVEL (UNFRACTIONATED): Heparin Unfractionated: 0.25 IU/mL — ABNORMAL LOW (ref 0.30–0.70)

## 2023-01-08 MED ORDER — EZETIMIBE 10 MG PO TABS: 10.0000 mg | ORAL_TABLET | Freq: Every day | ORAL | Status: DC

## 2023-01-08 MED ORDER — ATORVASTATIN CALCIUM 80 MG PO TABS
80.0000 mg | ORAL_TABLET | Freq: Every day | ORAL | Status: DC
  Administered 2023-01-08 – 2023-01-09 (×2): 80 mg via ORAL
  Filled 2023-01-08 (×2): qty 1

## 2023-01-08 MED ORDER — ISOSORBIDE MONONITRATE ER 30 MG PO TB24: 30.0000 mg | ORAL_TABLET | Freq: Every day | ORAL | Status: DC

## 2023-01-08 MED ORDER — CALCIUM GLUCONATE-NACL 1-0.675 GM/50ML-% IV SOLN
1.0000 g | Freq: Once | INTRAVENOUS | Status: AC
  Administered 2023-01-08: 1000 mg via INTRAVENOUS
  Filled 2023-01-08: qty 50

## 2023-01-08 MED ORDER — STROKE: EARLY STAGES OF RECOVERY BOOK: Freq: Once | Status: DC

## 2023-01-08 MED ORDER — HEPARIN BOLUS VIA INFUSION
3000.0000 [IU] | Freq: Once | INTRAVENOUS | Status: AC
  Administered 2023-01-08: 3000 [IU] via INTRAVENOUS
  Filled 2023-01-08: qty 3000

## 2023-01-08 MED ORDER — SENNOSIDES-DOCUSATE SODIUM 8.6-50 MG PO TABS: 1.0000 | ORAL_TABLET | Freq: Every evening | ORAL | Status: DC | PRN

## 2023-01-08 MED ORDER — PREGABALIN 25 MG PO CAPS
25.0000 mg | ORAL_CAPSULE | Freq: Two times a day (BID) | ORAL | Status: DC
  Administered 2023-01-08 – 2023-01-09 (×2): 25 mg via ORAL
  Filled 2023-01-08 (×3): qty 1

## 2023-01-08 MED ORDER — NICOTINE 14 MG/24HR TD PT24: 14.0000 mg | MEDICATED_PATCH | Freq: Every day | TRANSDERMAL | Status: DC | PRN

## 2023-01-08 MED ORDER — HEPARIN BOLUS VIA INFUSION
700.0000 [IU] | Freq: Once | INTRAVENOUS | Status: AC
  Administered 2023-01-08: 700 [IU] via INTRAVENOUS
  Filled 2023-01-08: qty 700

## 2023-01-08 MED ORDER — IOHEXOL 350 MG/ML SOLN
75.0000 mL | Freq: Once | INTRAVENOUS | Status: AC | PRN
  Administered 2023-01-08: 75 mL via INTRAVENOUS

## 2023-01-08 MED ORDER — ACETAMINOPHEN 325 MG PO TABS: 650.0000 mg | ORAL_TABLET | Freq: Four times a day (QID) | ORAL | Status: DC | PRN

## 2023-01-08 MED ORDER — CLOPIDOGREL BISULFATE 75 MG PO TABS
75.0000 mg | ORAL_TABLET | Freq: Every day | ORAL | Status: DC
  Administered 2023-01-08 – 2023-01-09 (×2): 75 mg via ORAL
  Filled 2023-01-08 (×2): qty 1

## 2023-01-08 MED ORDER — AMLODIPINE BESYLATE 10 MG PO TABS
10.0000 mg | ORAL_TABLET | Freq: Every day | ORAL | Status: DC
  Administered 2023-01-08 – 2023-01-09 (×2): 10 mg via ORAL
  Filled 2023-01-08: qty 2
  Filled 2023-01-08: qty 1

## 2023-01-08 MED ORDER — HYDRALAZINE HCL 50 MG PO TABS
100.0000 mg | ORAL_TABLET | Freq: Three times a day (TID) | ORAL | Status: DC
  Administered 2023-01-08 – 2023-01-09 (×4): 100 mg via ORAL
  Filled 2023-01-08 (×4): qty 2

## 2023-01-08 MED ORDER — WARFARIN - PHARMACIST DOSING INPATIENT: Freq: Every day | Status: DC

## 2023-01-08 MED ORDER — HEPARIN (PORCINE) 25000 UT/250ML-% IV SOLN
900.0000 [IU]/h | INTRAVENOUS | Status: DC
  Administered 2023-01-08: 800 [IU]/h via INTRAVENOUS
  Filled 2023-01-08: qty 250

## 2023-01-08 MED ORDER — ACETAMINOPHEN 650 MG RE SUPP: 650.0000 mg | Freq: Four times a day (QID) | RECTAL | Status: DC | PRN

## 2023-01-08 MED ORDER — WARFARIN SODIUM 6 MG PO TABS
9.0000 mg | ORAL_TABLET | Freq: Once | ORAL | Status: AC
  Administered 2023-01-08: 9 mg via ORAL
  Filled 2023-01-08: qty 1

## 2023-01-08 MED ORDER — CARVEDILOL 12.5 MG PO TABS
25.0000 mg | ORAL_TABLET | Freq: Two times a day (BID) | ORAL | Status: DC
  Administered 2023-01-08 – 2023-01-09 (×3): 25 mg via ORAL
  Filled 2023-01-08 (×3): qty 2

## 2023-01-08 MED ORDER — ASPIRIN 81 MG PO TBEC
81.0000 mg | DELAYED_RELEASE_TABLET | Freq: Every day | ORAL | Status: DC
  Administered 2023-01-08 – 2023-01-09 (×2): 81 mg via ORAL
  Filled 2023-01-08 (×2): qty 1

## 2023-01-08 MED ORDER — HYDRALAZINE HCL 20 MG/ML IJ SOLN
5.0000 mg | Freq: Once | INTRAMUSCULAR | Status: AC
  Administered 2023-01-08: 5 mg via INTRAVENOUS
  Filled 2023-01-08: qty 1

## 2023-01-08 NOTE — Progress Notes (Addendum)
STROKE TEAM PROGRESS NOTE   INTERVAL HISTORY Her grandson is at the bedside, daughter on phone.  C/o Diplopia x 4days, denies any other neurological symptom.  MRI negative for stroke.   Educated on secondary stroke risk factors, smoking/THC cessation.  Pt on coumadin at home, not therapeutic on labs. Agree with Heparin/coumadin.  Will add Zetia 10 mg if LD not within goal, as she is already on max Lipitor dose.    Vitals:   01/08/23 0545 01/08/23 0828 01/08/23 0830 01/08/23 0845  BP: (!) 152/91 (!) 147/91 (!) 148/88 (!) 145/87  Pulse: 69 63 65 69  Resp: Temp:  98.7 F (37.1 C)    TempSrc:  Oral    SpO2: 100% 99% 99% 100%  Weight:       CBC:  Recent Labs  Lab 01/07/23 1930 01/07/23 1955  WBC 4.0  --   NEUTROABS 1.3*  --   HGB 9.1* 8.8*  HCT 28.5* 26.0*  MCV 88.0  --   PLT 328  --    Basic Metabolic Panel:  Recent Labs  Lab 01/07/23 1930 01/07/23 1955  NA 137 137  K 4.2 5.2*  CL 102 108  CO2 22  --   GLUCOSE 84 73  BUN 29* 36*  CREATININE 1.60* 1.60*  CALCIUM 8.6*  --    Lipid Panel: No results for input(s): "CHOL", "TRIG", "HDL", "CHOLHDL", "VLDL", "LDLCALC" in the last 168 hours. HgbA1c: No results for input(s): "HGBA1C" in the last 168 hours. Urine Drug Screen:  Recent Labs  Lab 01/07/23 2113  LABOPIA NONE DETECTED  COCAINSCRNUR NONE DETECTED  LABBENZ NONE DETECTED  AMPHETMU NONE DETECTED  THCU POSITIVE*  LABBARB NONE DETECTED    Alcohol Level  Recent Labs  Lab 01/07/23 1950  ETH <10    IMAGING past 24 hours DG CHEST PORT 1 VIEW  Result Date: 01/08/2023 CLINICAL DATA:  Provided history: Abducens (sixth) nerve injury. Blurred vision (left greater than right). EXAM: PORTABLE CHEST 1 VIEW COMPARISON:  Prior chest radiographs 09/07/2012 and earlier. FINDINGS: Heart size within normal limits. Aortic atherosclerosis. No appreciable airspace consolidation or pulmonary edema. No evidence of pleural effusion or pneumothorax. No acute osseous  abnormality identified. IMPRESSION: 1.  No evidence of acute cardiopulmonary abnormality. 2.  Aortic Atherosclerosis (ICD10-I70.0). Electronically Signed   By: Jackey Loge D.O.   On: 01/08/2023 08:28   CT ANGIO HEAD NECK W WO CM  Result Date: 01/08/2023 CLINICAL DATA:  82 year old female with blurred vision. Unrevealing brain MRI this morning. EXAM: CT ANGIOGRAPHY HEAD AND NECK WITH AND WITHOUT CONTRAST TECHNIQUE: Multidetector CT imaging of the head and neck was performed using the standard protocol during bolus administration of intravenous contrast. Multiplanar CT image reconstructions and MIPs were obtained to evaluate the vascular anatomy. Carotid stenosis measurements (when applicable) are obtained utilizing NASCET criteria, using the distal internal carotid diameter as the denominator. RADIATION DOSE REDUCTION: This exam was performed according to the departmental dose-optimization program which includes automated exposure control, adjustment of the mA and/or kV according to patient size and/or use of iterative reconstruction technique. CONTRAST:  75mL OMNIPAQUE IOHEXOL 350 MG/ML SOLN COMPARISON:  Brain MRI 0139 hours today.  Head CT yesterday. FINDINGS: CTA NECK Skeleton: Left TMJ degeneration. Cervical spine degeneration, mostly left side facet arthropathy, superimposed on multilevel degenerative appearing cervical spondylolisthesis. Left maxillary sinus mucoperiosteal thickening. Congenital incomplete ossification of the posterior C1 ring. No acute osseous abnormality identified. Upper chest: Negative visible mediastinum, visible central pulmonary arteries  are enhancing and appear patent. Mild pulmonary atelectasis bilaterally, possibly superimposed on centrilobular emphysema. Other neck: Necklace artifact at the thoracic inlet. Otherwise negative. Aortic arch: 3 vessel arch. Mild to moderate for age Calcified aortic atherosclerosis. Right carotid system: Minor brachiocephalic artery plaque without  stenosis. Tortuous right CCA origin. Mild to moderate calcified plaque at the right ICA origin and bulb without stenosis. Left carotid system: Similar tortuosity and mild proximal ICA plaque without stenosis. Vertebral arteries: Negative proximal right subclavian artery. Calcified atherosclerosis at the right vertebral artery origin on series 5 image 239. No significant stenosis there (series 8, image 173) and additional right V1 calcified plaque with mild associated stenosis (series 5, image 231). Right vertebral artery than mildly tortuous to the skull base with no additional plaque or stenosis. Minimal proximal left subclavian artery plaque. Calcified plaque at the left vertebral artery origin with mild to moderate stenosis on series 8, image 174. Tortuous left V1 segment. Mildly non dominant left vertebral artery with V2 tortuosity and mild calcified plaque. No additional significant stenosis to the skull base. CTA HEAD Posterior circulation: Mildly dominant right V4 segment. No distal vertebral or vertebrobasilar junction plaque or stenosis. Normal PICA origins. Patent basilar artery without stenosis. Patent SCA and PCA origins. Both posterior communicating arteries are present. Bilateral PCA branches are within normal limits. Anterior circulation: Both ICA siphons are patent. Moderate left siphon calcified plaque, but some underlying siphon dolichoectasia and no significant stenosis. Normal left posterior communicating artery origin. Similar right ICA siphon tortuosity with moderate calcified plaque but no significant right siphon stenosis. Normal right posterior communicating artery origin. Both ophthalmic artery origins appear patent, normal. Patent carotid termini, MCA and ACA origins. Normal anterior communicating artery. Bilateral ACA branches are within normal limits. Left MCA M1 segment and bifurcation are patent without stenosis. Right MCA M1 segment and bifurcation are patent without stenosis.  Bilateral MCA branches are within normal limits. Venous sinuses: Early contrast timing, grossly patent. Anatomic variants: Mildly dominant right vertebral artery. Review of the MIP images confirms the above findings IMPRESSION: 1. Negative for large vessel occlusion. 2. No significant extracranial carotid plaque or stenosis. Up to moderate bilateral ICA siphon calcified plaque, but underlying ICA siphon ectasia. Subsequently no significant anterior circulation stenosis. Ophthalmic arteries appear normal. 3. Calcified atherosclerosis of both proximal Vertebral Arteries. Up to Moderate Left Vertebral Artery origin stenosis. But no other significant posterior circulation stenosis. 4.  Aortic Atherosclerosis (ICD10-I70.0).  Questionable Emphysema. 5. Electronically Signed   By: Odessa Fleming M.D.   On: 01/08/2023 05:48   MR BRAIN WO CONTRAST  Result Date: 01/08/2023 CLINICAL DATA:  Blurry vision EXAM: MRI HEAD WITHOUT CONTRAST TECHNIQUE: Multiplanar, multiecho pulse sequences of the brain and surrounding structures were obtained without intravenous contrast. COMPARISON:  None Available. FINDINGS: Brain: No acute infarct, mass effect or extra-axial collection. Chronic microhemorrhage in the left temporal lobe. There is multifocal hyperintense T2-weighted signal within the white matter. Generalized volume loss. The midline structures are normal. Vascular: Major flow voids are preserved. Skull and upper cervical spine: Normal calvarium and skull base. Visualized upper cervical spine and soft tissues are normal. Sinuses/Orbits:No paranasal sinus fluid levels or advanced mucosal thickening. No mastoid or middle ear effusion. Normal orbits. IMPRESSION: 1. No acute intracranial abnormality. 2. Findings of chronic small vessel ischemia and volume loss. Electronically Signed   By: Deatra Robinson M.D.   On: 01/08/2023 01:56   CT HEAD WO CONTRAST  Result Date: 01/07/2023 CLINICAL DATA:  Blurred vision. EXAM: CT  HEAD WITHOUT  CONTRAST TECHNIQUE: Contiguous axial images were obtained from the base of the skull through the vertex without intravenous contrast. RADIATION DOSE REDUCTION: This exam was performed according to the departmental dose-optimization program which includes automated exposure control, adjustment of the mA and/or kV according to patient size and/or use of iterative reconstruction technique. COMPARISON:  February 24, 2013 FINDINGS: Brain: There is mild cerebral atrophy with widening of the extra-axial spaces and ventricular dilatation. There are areas of decreased attenuation within the white matter tracts of the supratentorial brain, consistent with microvascular disease changes. Vascular: No hyperdense vessel or unexpected calcification. Skull: Normal. Negative for fracture or focal lesion. Sinuses/Orbits: No acute finding. Other: None. IMPRESSION: 1. No acute intracranial abnormality. 2. Generalized cerebral atrophy and microvascular disease changes of the supratentorial brain. Electronically Signed   By: Aram Candela M.D.   On: 01/07/2023 20:51    PHYSICAL EXAM  Temp:  [97.7 F (36.5 C)-98.7 F (37.1 C)] 97.8 F (36.6 C) (04/22 1211) Pulse Rate:  [54-85] 71 (04/22 1211) Resp:  [13-20] 20 (04/22 1211) BP: (145-188)/(82-101) 150/99 (04/22 1211) SpO2:  [95 %-100 %] 98 % (04/22 1211) Weight:  [49.4 kg] 49.4 kg (04/21 1926)  General - Well nourished, well developed, elderly African-American female in no apparent distress. Ophthalmologic - Double vision in R eye.  Cardiovascular - Regular rhythm and rate.  Mental Status -  Level of arousal and orientation to time, place, and person were intact. Language including expression, naming, repetition, comprehension was assessed and found intact. Attention span and concentration. recent and remote memory were intact.  Cranial Nerves II - XII - II - Continue double vision in right eye.  Eye patch over right eye does help with this. III, IV, VI - Extraocular  movements intact.  Except impaired abduction in the right eye with esotropia V - Facial sensation intact bilaterally. VII - Facial movement intact bilaterally. VIII - Hearing & vestibular intact bilaterally. X - Palate elevates symmetrically. XI - Chin turning & shoulder shrug intact bilaterally. XII - Tongue protrusion intact.  Motor Strength - The patient's strength was normal in all extremities and pronator drift was absent.  Tone and bulk normal. Sensory - Light touch assessed and symmetrical.   Coordination - The patient had normal movements in the hands and feet with no ataxia or dysmetria.  Tremor was absent. Gait and Station - deferred.   ASSESSMENT/PLAN Ms. Alitzel Cookson is a 82 y.o. female with history of hypertension, CHF, CAD, CKD stage IIIb, current smoker, presenting with complaints of diplopia in the right eye x 4 days.  She reports that she has been compliant with her Coumadin and has been on the medication since 2012. MRI did not reveal any acute intracranial abnormality and chronic small vessel ischemia with volume loss. CT angiogram of the head and neck did not reveal any large vessel occlusion or significant extracranial carotid plaque or stenosis.   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 Findings of chronic small vessel ischemia and volume loss 2D Echo: pending LDL No results found for requested labs within last 1095 days. HgbA1c No results found for requested labs within last 1095 days. VTE prophylaxis - heparin IV     Diet  Diet Carb Modified Fluid consistency: Thin; Room service appropriate? Yes; Fluid restriction: 1500 mL Fluid   Coumadin to admission, now on heparin IV.  Patient on  heparin due to nontherapeutic INR.  Agree with heparin/Coumadin.  Therapy recommendations:  pending Disposition:  pending  Subtherapeutic INR Aneurysm of the left ventricle Due to history of left ventricle aneurysm Heparin drip per pharmacy continue Coumadin Patient with a history of aneurysm of the left ventricle for which she is on anticoagulation.  INR subtherapeutic at 1.4, but had been intermittently subtherapeutic previously in the past.  Case was discussed with Dr. Gala Romney via secure message who was okay with the patient switching to Eliquis or continuing with Coumadin if she wished.  Patient wishes to continue on Coumadin -Heparin drip per pharmacy -Continue Coumadin Per pharmacy  Hypertension Home meds: Amlodipine 10 mg, carvedilol 25 mg, hydralazine 100 mg, Imdur 30 mg Permissive hypertension (OK if < 220/120) but gradually normalize in 1-2 days Long-term BP goal normotensive  Hyperlipidemia Home meds: Lipitor 80 mg, resumed in hospital LDL No results found for requested labs within last 1095 days., goal < 70 Will add Zetia 10 mg if LD not within goal, as she is already on max Lipitor dose. Continue statin at discharge  Diabetes type II, no history Home meds:  none HgbA1c No results found for requested labs within last 1095 days., goal < 7.0 CBG: 84 -> 73  Other Stroke Risk Factors Advanced Age >/= 70  Cigarette smoker, advised to stop smoking Substance abuse - UDS:  THC POSITIVE.  Patient advised to stop using due to stroke risk. Coronary artery disease Congestive heart failure Entresto, Jardiance Home meds  Other Active Problems Hypocalcemia Hyperkalemia Normocytic anemia MGUS Sciatica  Hospital day # 0   Pt seen by Neuro NP/APP and later by MD. Note/plan to be edited by MD as needed.    Lynnae January, DNP, AGACNP-BC [facility] Please use AMION for contact information & EPIC for messaging.  STROKE MD NOTE :  I have personally obtained  history,examined this patient, reviewed notes, independently viewed imaging studies, participated in medical decision making and plan of care.ROS completed by me personally and pertinent positives fully documented  I have made any additions or clarifications directly to the above note. Agree with note above.  Patient presents with diplopia secondary to right 6th nerve palsy without any associated symptoms and negative brain imaging suggesting microvascular 6th nerve palsy due to hypertension and small vessel disease.  Patient counseled to be compliant with her medications and maintain aggressive risk factor modification.  Try eyepatch for diplopia and not resolved in 2 to 3 months and then consider outpatient referral to ophthalmology for prism glasses.  Patient counseled to quit smoking cigarettes and marijuana and is agreeable.  Discussed with patient, daughter Dr. Corliss Skains form With questions.  Greater than 50% time during this 50-minute visit were spent in counseling and coordination of care about diplopia and 6th nerve palsy and discussion about prevention and treatment and answering questions.  Delia Heady, MD Medical Director [facility] Pager: [phone] 01/08/2023 5:02 PM   To contact Stroke Continuity provider, please refer to [email or web address]. After hours, contact General Neurology

## 2023-01-08 NOTE — Progress Notes (Signed)
ANTICOAGULATION CONSULT NOTE - Initial Consult  Pharmacy Consult for Heparin/Warfarin Indication:  LV aneursym - warfarin PTA  Allergies  Allergen Reactions   Vicodin [Hydrocodone-Acetaminophen] Nausea And Vomiting    Patient Measurements: Height:  (149.9 cm) Weight: 49.4 kg (109 lb) IBW/kg (Calculated) : 43.2 Heparin Dosing Weight: 49 kg  Vital Signs: Temp: 98.4 F (36.9 C) (04/22 2000) Temp Source: Oral (04/22 2000) BP: 127/75 (04/22 2000) Pulse Rate: 66 (04/22 2000)  Labs: Recent Labs    01/07/23 1930 01/07/23 1955 01/08/23 1622 01/08/23 1952  HGB 9.1* 8.8*  --   --   HCT 28.5* 26.0*  --   --   PLT 328  --   --   --   APTT 42*  --   --   --   LABPROT 17.3*  --  14.4  --   INR 1.4*  --  1.1  --   HEPARINUNFRC  --   --   --  0.25*  CREATININE 1.60* 1.60* 1.45*  --      Estimated Creatinine Clearance: 20.8 mL/min (A) (by C-G formula based on SCr of 1.45 mg/dL (H)).   Medical History: Past Medical History:  Diagnosis Date   Anemia    Arthritis    CAD (coronary artery disease) 09/11/2012   CHF (congestive heart failure)    Dental caries    Essential hypertension 10/04/2012   Ischemic cardiomyopathy    NSTEMI (non-ST elevated myocardial infarction) 09/11/2012   Shortness of breath     Assessment: 66 YOF presenting with TIA. History of LV aneurysm on warfarin. INR subtherapeutic on admission - pharmacy consulted to start heparin as a bridge until INR therapeutic on warfarin.  Warfarin PTA regimen:  Sun/Tues/Wed/Fri and  AOD. Last dose 4/18 PM  CBC WNL - INR 1.4  4/22 PM update: Heparin level: 0.25- subtherapeutic No signs of bleeding or issues with heparin gtt  Goal of Therapy:  INR 2-3 Heparin level 0.3-0.7 units/ml Monitor platelets by anticoagulation protocol: Yes   Plan:  Heparin 700 units IV once then increase infusion to 900 units/hr Heparin level with AM labs Daily CBC/heparin level/INR  Continue heparin until INR  therapeutic  Greta Doom BS, PharmD, BCPS Clinical Pharmacist 01/08/2023 8:48 PM  Contact: [phone] after 3 PM  "Be curious, not judgmental..." -Debbora Dus

## 2023-01-08 NOTE — ED Provider Notes (Signed)
4:26 AM Assumed care from Dr. Adela Lank (report to different physician), please see their note for full history, physical and decision making until this point. In brief this is a 82 y.o. year old female who presented to the ED tonight with Blurred Vision     Diplopia since she woke up on Thursday. Started lyrica recently, thinks it might be related. Workup done at [facility], d/w neurology here, recommended MRI. Had MRI, negative. No other neuro symptoms.   [place] evaluated with neuro. Recommends CTA, admit for stroke workup. Patient aware. D/w Dr. Marilynn Rail for same.   Labs, studies and imaging reviewed by myself and considered in medical decision making if ordered. Imaging interpreted by radiology.  Labs Reviewed  PROTIME-INR - Abnormal; Notable for the following components:      Result Value   Prothrombin Time 17.3 (*)    INR 1.4 (*)    All other components within normal limits  APTT - Abnormal; Notable for the following components:   aPTT 42 (*)    All other components within normal limits  CBC - Abnormal; Notable for the following components:   RBC 3.24 (*)    Hemoglobin 9.1 (*)    HCT 28.5 (*)    All other components within normal limits  DIFFERENTIAL - Abnormal; Notable for the following components:   Neutro Abs 1.3 (*)    All other components within normal limits  COMPREHENSIVE METABOLIC PANEL - Abnormal; Notable for the following components:   BUN 29 (*)    Creatinine, Ser 1.60 (*)    Calcium 8.6 (*)    Total Protein 8.6 (*)    GFR, Estimated 32 (*)    All other components within normal limits  RAPID URINE DRUG SCREEN, HOSP PERFORMED - Abnormal; Notable for the following components:   Tetrahydrocannabinol POSITIVE (*)    All other components within normal limits  URINALYSIS, ROUTINE W REFLEX MICROSCOPIC - Abnormal; Notable for the following components:   Color, Urine STRAW (*)    Hgb urine dipstick SMALL (*)    All other components within normal limits  I-STAT CHEM 8, ED -  Abnormal; Notable for the following components:   Potassium 5.2 (*)    BUN 36 (*)    Creatinine, Ser 1.60 (*)    Calcium, Ion 1.01 (*)    Hemoglobin 8.8 (*)    HCT 26.0 (*)    All other components within normal limits  ETHANOL    MR BRAIN WO CONTRAST  Final Result    CT HEAD WO CONTRAST  Final Result      No follow-ups on file.    Tanelle Lanzo, Barbara Cower, MD 01/08/23 [phone]

## 2023-01-08 NOTE — Consult Note (Addendum)
Neurology Consultation  Reason for Consult: Double vision Referring Physician:, Dr. Erin Hearing  CC: Double vision  History is obtained from: Patient, chart  HPI: Faith Gray is a 82 y.o. female past medical history of coronary artery disease, hypertension, on Coumadin for LV aneurysm, chronic systolic heart failure presented to the emergency room for sudden onset of double vision that started upon waking up Thursday morning.  Last known well when she went to bed on 01/03/2023.  Reports that double vision is worse looking to the right.  Also reports some blurring of vision.  No tingling numbness weakness.  No headaches.  Denies any jaw claudication.  Denies any tinnitus or vertiginous symptoms. Yesterday or a couple days ago she had an episode of vomiting after eating some cookout food but has not been constantly nauseous. No chest pain shortness of breath. No history of diabetes Reports compliance to medications. INR 1.4.   LKW: Sometime on the night of 01/03/2023 IV thrombolysis given?: no, outside the window-also on Coumadin Premorbid modified Rankin scale (mRS): 1   ROS: Full ROS was performed and is negative except as noted in the HPI.   Past Medical History:  Diagnosis Date   Anemia    Arthritis    CAD (coronary artery disease) 09/11/2012   CHF (congestive heart failure)    Dental caries    Essential hypertension 10/04/2012   Ischemic cardiomyopathy    NSTEMI (non-ST elevated myocardial infarction) 09/11/2012   Shortness of breath      Family History  Problem Relation Age of Onset   Sickle cell anemia Brother      Social History:   reports that she has quit smoking. Her smoking use included cigarettes. She has a 10.00 pack-year smoking history. She has never used smokeless tobacco. She reports that she does not drink alcohol and does not use drugs.  Medications  Current Facility-Administered Medications:    hydrALAZINE (APRESOLINE) injection 5 mg, 5 mg, Intravenous,  Once, Mesner, Barbara Cower, MD   LORazepam (ATIVAN) tablet 0.5 mg, 0.5 mg, Oral, Once PRN, Melene Plan, DO  Current Outpatient Medications:    amLODipine (NORVASC) 10 MG tablet, Take 1 tablet (10 mg total) by mouth daily., Disp: 90 tablet, Rfl: 3   carvedilol (COREG) 25 MG tablet, Take 25 mg by mouth 2 (two) times daily with a meal., Disp: , Rfl:    ENTRESTO 97-103 MG, TAKE 1 TABLET TWICE DAILY  (DOSE  CHANGE) (Patient taking differently: Take 1 tablet by mouth 2 (two) times daily.), Disp: 180 tablet, Rfl: 3   hydrALAZINE (APRESOLINE) 100 MG tablet, TAKE 1 TABLET THREE TIMES DAILY ( DOSE INCREASE ) (Patient taking differently: Take 100 mg by mouth 3 (three) times daily.), Disp: 270 tablet, Rfl: 3   isosorbide mononitrate (IMDUR) 30 MG 24 hr tablet, Take 30 mg by mouth daily., Disp: , Rfl:    JARDIANCE 10 MG TABS tablet, TAKE 1 TABLET EVERY DAY (Patient taking differently: Take 10 mg by mouth daily.), Disp: 90 tablet, Rfl: 3   pregabalin (LYRICA) 25 MG capsule, Take 25 mg by mouth 2 (two) times daily., Disp: , Rfl:    spironolactone (ALDACTONE) 25 MG tablet, TAKE 1 TABLET EVERY DAY, Disp: 90 tablet, Rfl: 11   TYLENOL 500 MG tablet, Take 500-1,000 mg by mouth every 6 (six) hours as needed for mild pain or headache., Disp: , Rfl:    warfarin (COUMADIN) 6 MG tablet, TAKE 1 TABLET TO 1 & 1/2 TABLETS DAILY AS DIRECTED BY THE COUMADIN CLINIC (  Patient taking differently: Take 6-9 mg by mouth See admin instructions. Take 6 mg by mouth at bedtime on Sun/Tues/Wed/Fri and 9 mg on Mon/Thurs/Sat), Disp: 100 tablet, Rfl: 0   atorvastatin (LIPITOR) 80 MG tablet, TAKE 1 TABLET BY MOUTH DAILY AT 6PM (Patient not taking: Reported on 01/07/2023), Disp: 90 tablet, Rfl: 1   carvedilol (COREG) 12.5 MG tablet, TAKE 1 AND 1/2 TABLETS TWO TIMES DAILY WITH MEALS. (Patient not taking: Reported on 01/07/2023), Disp: 270 tablet, Rfl: 3   isosorbide mononitrate (IMDUR) 60 MG 24 hr tablet, TAKE 1 AND 1/2  TABLETS   DAILY. (Patient not  taking: Reported on 01/07/2023), Disp: 135 tablet, Rfl: 3  Exam: Current vital signs: BP (!) 173/82 (BP Location: Left Arm)   Pulse (!) 57   Temp 97.7 F (36.5 C) (Oral)   Resp 16   Wt 49.4 kg   SpO2 100%   BMI 21.29 kg/m  Vital signs in last 24 hours: Temp:  [97.7 F (36.5 C)] 97.7 F (36.5 C) (04/22 0017) Pulse Rate:  [54-85] 57 (04/22 0017) Resp:  [13-16] 16 (04/22 0017) BP: (155-188)/(82-101) 173/82 (04/22 0017) SpO2:  [95 %-100 %] 100 % (04/22 0017) Weight:  [49.4 kg] 49.4 kg (04/21 1926) General: Awake alert in no distress HEENT: Normocephalic atraumatic Lungs: Clear Cardiovascular: Regular rhythm Abdomen nondistended nontender Extremities warm well-perfused Neurologic exam Awake alert oriented x 3 No dysarthria No aphasia Cranial nerves II to XII: Pupils equal round react light, extract movement examination reveals inability to abduct the right eye completely indicating an isolated 6 cranial nerve palsy on the right.  All other extraocular movements were intact.  Visual fields are full.  Facial sensation intact.  Face appears symmetric.  Tongue and palate midline. Motor examination with no drift in any of the 4 extremities Sensation intact light touch Coordination with no dysmetria in the upper extremities NIH stroke scale-1 for gaze  Labs I have reviewed labs in epic and the results pertinent to this consultation are:  CBC    Component Value Date/Time   WBC 4.0 01/07/2023 1930   RBC 3.24 (L) 01/07/2023 1930   HGB 8.8 (L) 01/07/2023 1955   HCT 26.0 (L) 01/07/2023 1955   PLT 328 01/07/2023 1930   MCV 88.0 01/07/2023 1930   MCH 28.1 01/07/2023 1930   MCHC 31.9 01/07/2023 1930   RDW 13.9 01/07/2023 1930   LYMPHSABS 2.1 01/07/2023 1930   MONOABS 0.5 01/07/2023 1930   EOSABS 0.1 01/07/2023 1930   BASOSABS 0.0 01/07/2023 1930    CMP     Component Value Date/Time   NA 137 01/07/2023 1955   NA 140 02/15/2018 1435   K 5.2 (H) 01/07/2023 1955   CL 108  01/07/2023 1955   CO2 22 01/07/2023 1930   GLUCOSE 73 01/07/2023 1955   BUN 36 (H) 01/07/2023 1955   BUN 7 (L) 02/15/2018 1435   CREATININE 1.60 (H) 01/07/2023 1955   CALCIUM 8.6 (L) 01/07/2023 1930   PROT 8.6 (H) 01/07/2023 1930   ALBUMIN 3.9 01/07/2023 1930   AST 31 01/07/2023 1930   ALT 30 01/07/2023 1930   ALKPHOS 104 01/07/2023 1930   BILITOT 0.6 01/07/2023 1930   GFRNONAA 32 (L) 01/07/2023 1930   GFRAA 42 (L) 06/04/2020 1225    Lipid Panel     Component Value Date/Time   CHOL 121 06/02/2014 0807   TRIG 53 06/02/2014 0807   HDL 55 06/02/2014 0807   CHOLHDL 2.2 06/02/2014 0807   VLDL 11  06/02/2014 0807   LDLCALC 55 06/02/2014 0807    Imaging I have reviewed the images obtained:  CT head unremarkable Initially case discussed with me and I recommended MRI of the brain which was completed without contrast-reviewed personally-no acute stroke.  Assessment:  82 year old woman past history of coronary artery disease, hypertension, on Coumadin for LV aneurysm, presented for sudden onset of diplopia when looking to the right.  On examination she has a right 6th cranial nerve palsy, rest of the neurological exam is unremarkable. Given her history, this is likely an isolated 6th nerve palsy due to microvascular ischemia versus a small brainstem infarct that was not seen on MRI.  Small brainstem infarcts can be missed on MRI as with their current slice thickness parameters. Irrespective, given the risk factors, she will need a workup for stroke  Impression: Small brainstem infarct with negative MRI versus microvascular ischemic damage to 6 cranial nerve in the setting of hypertension and other cerebrovascular risk factors  Recommendations: -Admit to hospitalist -Telemetry monitoring -Allow for permissive hypertension for the first 24-48h - only treat PRN if SBP >220 mmHg. Blood pressures can be gradually normalized to SBP<140 upon discharge. -CT Angiogram of Head and neck-to  rule out aneurysms or compressive lesions or cavernous sinus abnormalities which might cause as needed since no pulses -Echocardiogram -HgbA1c, fasting lipid panel -Frequent neuro checks -Prophylactic therapy-currently on Coumadin and-INR is subtherapeutic at 1.4.  Given that she is now 4 days out from her last known well, I would resume anticoagulation.  If cardiology prefers Coumadin then I would recommend heparinization as a bridge to Coumadin to make it therapeutic.  Otherwise I would recommend DOAC for better compliance.  Will defer this to the admitting team to discuss with cardiology heart failure team-Dr. Gala Romney. -Atorvastatin 80 mg PO daily -Risk factor modification -PT consult, OT consult, Speech consult -No need for permissive hypertension.  Plan discussed with the patient, daughter at bedside and Dr. Clayborne Dana in the ER.  Please page stroke NP/PA/MD (listed on AMION)  from 8am-4 pm as this patient will be followed by the stroke team at this point.  -- Milon Dikes, MD Neurologist [facility] Pager: [phone]

## 2023-01-08 NOTE — Progress Notes (Signed)
Pt admitted to 3W AxOx4, VS as per flow. BP elevated. Pt oriented to 3W processes. Only residual subjective stroke symptom is her blurry vision. Pt familiar with the [facility] system. All questions and concerns addressed. Call bell placed within reach, will continue to monitor and maintain safety.

## 2023-01-08 NOTE — ED Notes (Signed)
ED TO INPATIENT HANDOFF REPORT  ED Nurse Name and Phone #: josh  S Name/Age/Gender Faith Gray 82 y.o. female Room/Bed: 036C/036C  Code Status   Code Status: Full Code  Home/SNF/Other Home Patient oriented to: self, place, time, and situation Is this baseline? Yes   Triage Complete: Triage complete  Chief Complaint TIA (transient ischemic attack) [G45.9] Diplopia [H53.2]  Triage Note Pt presents from home for blurred vision (L>R) since Thurs., constant blurring. Denies weakness or speech difficulties. A&Ox4  BP has been slightly elevated 170SBP at home. Today 188/93 in triage.  Emesis on Wed. Denies fever, chills H/o CHF   Allergies Allergies  Allergen Reactions   Vicodin [Hydrocodone-Acetaminophen] Nausea And Vomiting    Level of Care/Admitting Diagnosis ED Disposition     ED Disposition  Admit   Condition  --   Comment  Hospital Area: [facility] [[id number]]  Level of Care: Telemetry Medical [104]  May admit patient to [facility] or [facility] if equivalent level of care is available:: No  Covid Evaluation: Asymptomatic - no recent exposure (last 10 days) testing not required  Diagnosis: Diplopia [368.2.ICD-9-CM]  Admitting Physician: Clydie Braun [[id number]]  Attending Physician: Clydie Braun [[id number]]  Certification:: I certify this patient will need inpatient services for at least 2 midnights  Estimated Length of Stay: 2          B Medical/Surgery History Past Medical History:  Diagnosis Date   Anemia    Arthritis    CAD (coronary artery disease) 09/11/2012   CHF (congestive heart failure)    Dental caries    Essential hypertension 10/04/2012   Ischemic cardiomyopathy    NSTEMI (non-ST elevated myocardial infarction) 09/11/2012   Shortness of breath    Past Surgical History:  Procedure Laterality Date   CARDIAC CATHETERIZATION     LEFT AND RIGHT HEART CATHETERIZATION WITH CORONARY ANGIOGRAM N/A 09/10/2012    Procedure: LEFT AND RIGHT HEART CATHETERIZATION WITH CORONARY ANGIOGRAM;  Surgeon: Dolores Patty, MD;  Location: [facility] CATH LAB;  Service: Cardiovascular;  Laterality: N/A;   MULTIPLE EXTRACTIONS WITH ALVEOLOPLASTY N/A 02/16/2017   Procedure: MULTIPLE EXTRACTION WITH ALVEOLOPLASTY;  Surgeon: [name], DDS;  Location: [facility] OR;  Service: Oral Surgery;  Laterality: N/A;     A IV Location/Drains/Wounds Patient Lines/Drains/Airways Status     Active Line/Drains/Airways     Name Placement date Placement time Site Days   Peripheral IV 01/07/23 20 G Anterior;Proximal;Right Forearm 01/07/23  2037  Forearm  1   Incision (Closed) 02/16/17 Lip 02/16/17  0808  -- 2152            Intake/Output Last 24 hours No intake or output data in the 24 hours ending 01/08/23 1550  Labs/Imaging Results for orders placed or performed during the hospital encounter of 01/07/23 (from the past 48 hour(s))  Protime-INR     Status: Abnormal   Collection Time: 01/07/23  7:30 PM  Result Value Ref Range   Prothrombin Time 17.3 (H) 11.4 - 15.2 seconds   INR 1.4 (H) 0.8 - 1.2    Comment: (NOTE) INR goal varies based on device and disease states. Performed at [facility], [address], [place], [place] [address]   APTT     Status: Abnormal   Collection Time: 01/07/23  7:30 PM  Result Value Ref Range   aPTT 42 (H) 24 - 36 seconds    Comment:        IF BASELINE aPTT IS  ELEVATED, SUGGEST PATIENT RISK ASSESSMENT BE USED TO DETERMINE APPROPRIATE ANTICOAGULANT THERAPY. Performed at [facility], [address] [place], [place] [address]   CBC     Status: Abnormal   Collection Time: 01/07/23  7:30 PM  Result Value Ref Range   WBC 4.0 4.0 - 10.5 K/uL   RBC 3.24 (L) 3.87 - 5.11 MIL/uL   Hemoglobin 9.1 (L) 12.0 - 15.0 g/dL   HCT 62.1 (L) 30.8 - 65.7 %   MCV 88.0 80.0 - 100.0 fL   MCH 28.1 26.0 - 34.0 pg   MCHC 31.9 30.0 - 36.0 g/dL   RDW 84.6 96.2 - 95.2 %    Platelets 328 150 - 400 K/uL   nRBC 0.0 0.0 - 0.2 %    Comment: Performed at [facility], [address], [place], [place] [address]  Differential     Status: Abnormal   Collection Time: 01/07/23  7:30 PM  Result Value Ref Range   Neutrophils Relative % 32 %   Neutro Abs 1.3 (L) 1.7 - 7.7 K/uL   Lymphocytes Relative 54 %   Lymphs Abs 2.1 0.7 - 4.0 K/uL   Monocytes Relative 11 %   Monocytes Absolute 0.5 0.1 - 1.0 K/uL   Eosinophils Relative 2 %   Eosinophils Absolute 0.1 0.0 - 0.5 K/uL   Basophils Relative 1 %   Basophils Absolute 0.0 0.0 - 0.1 K/uL   Immature Granulocytes 0 %   Abs Immature Granulocytes 0.01 0.00 - 0.07 K/uL    Comment: Performed at [facility], [address], [place], [place] [address]  Comprehensive metabolic panel     Status: Abnormal   Collection Time: 01/07/23  7:30 PM  Result Value Ref Range   Sodium 137 135 - 145 mmol/L   Potassium 4.2 3.5 - 5.1 mmol/L   Chloride 102 98 - 111 mmol/L   CO2 22 22 - 32 mmol/L   Glucose, Bld 84 70 - 99 mg/dL    Comment: Glucose reference range applies only to samples taken after fasting for at least 8 hours.   BUN 29 (H) 8 - 23 mg/dL   Creatinine, Ser 2.72 (H) 0.44 - 1.00 mg/dL   Calcium 8.6 (L) 8.9 - 10.3 mg/dL   Total Protein 8.6 (H) 6.5 - 8.1 g/dL   Albumin 3.9 3.5 - 5.0 g/dL   AST 31 15 - 41 U/L   ALT 30 0 - 44 U/L   Alkaline Phosphatase 104 38 - 126 U/L   Total Bilirubin 0.6 0.3 - 1.2 mg/dL   GFR, Estimated 32 (L) >60 mL/min    Comment: (NOTE) Calculated using the CKD-EPI Creatinine Equation (2021)    Anion gap 13 5 - 15    Comment: Performed at [facility], [address], [place], [place] [address]  Ethanol     Status: None   Collection Time: 01/07/23  7:50 PM  Result Value Ref Range   Alcohol, Ethyl (B) <10 <10 mg/dL    Comment: (NOTE) Lowest detectable limit for serum alcohol is 10 mg/dL.  For medical purposes only. Performed at [facility], [address], [place], [place] [address]   I-stat chem 8, ED     Status: Abnormal   Collection Time: 01/07/23  7:55 PM  Result Value Ref Range   Sodium 137 135 - 145 mmol/L   Potassium 5.2 (H) 3.5 - 5.1 mmol/L   Chloride 108 98 - 111 mmol/L  BUN 36 (H) 8 - 23 mg/dL   Creatinine, Ser 1.61 (H) 0.44 - 1.00 mg/dL   Glucose, Bld 73 70 - 99 mg/dL    Comment: Glucose reference range applies only to samples taken after fasting for at least 8 hours.   Calcium, Ion 1.01 (L) 1.15 - 1.40 mmol/L   TCO2 23 22 - 32 mmol/L   Hemoglobin 8.8 (L) 12.0 - 15.0 g/dL   HCT 09.6 (L) 04.5 - 40.9 %  Urine rapid drug screen (hosp performed)     Status: Abnormal   Collection Time: 01/07/23  9:13 PM  Result Value Ref Range   Opiates NONE DETECTED NONE DETECTED   Cocaine NONE DETECTED NONE DETECTED   Benzodiazepines NONE DETECTED NONE DETECTED   Amphetamines NONE DETECTED NONE DETECTED   Tetrahydrocannabinol POSITIVE (A) NONE DETECTED   Barbiturates NONE DETECTED NONE DETECTED    Comment: (NOTE) DRUG SCREEN FOR MEDICAL PURPOSES ONLY.  IF CONFIRMATION IS NEEDED FOR ANY PURPOSE, NOTIFY LAB WITHIN 5 DAYS.  LOWEST DETECTABLE LIMITS FOR URINE DRUG SCREEN Drug Class                     Cutoff (ng/mL) Amphetamine and metabolites    1000 Barbiturate and metabolites    200 Benzodiazepine                 200 Opiates and metabolites        300 Cocaine and metabolites        300 THC                            50 Performed at [facility], [address], [place], [place] [address]   Urinalysis, Routine w reflex microscopic -Urine, Clean Catch     Status: Abnormal   Collection Time: 01/07/23  9:13 PM  Result Value Ref Range   Color, Urine STRAW (A) YELLOW   APPearance CLEAR CLEAR   Specific Gravity, Urine 1.006 1.005 - 1.030   pH 6.0 5.0 - 8.0   Glucose, UA NEGATIVE NEGATIVE mg/dL   Hgb urine dipstick SMALL (A) NEGATIVE   Bilirubin Urine NEGATIVE NEGATIVE    Ketones, ur NEGATIVE NEGATIVE mg/dL   Protein, ur NEGATIVE NEGATIVE mg/dL   Nitrite NEGATIVE NEGATIVE   Leukocytes,Ua NEGATIVE NEGATIVE   RBC / HPF 11-20 0 - 5 RBC/hpf   WBC, UA 0-5 0 - 5 WBC/hpf   Bacteria, UA NONE SEEN NONE SEEN   Squamous Epithelial / HPF 0-5 0 - 5 /HPF    Comment: Performed at [facility], [address], [place], [place] [address]   DG CHEST PORT 1 VIEW  Result Date: 01/08/2023 CLINICAL DATA:  Provided history: Abducens (sixth) nerve injury. Blurred vision (left greater than right). EXAM: PORTABLE CHEST 1 VIEW COMPARISON:  Prior chest radiographs 09/07/2012 and earlier. FINDINGS: Heart size within normal limits. Aortic atherosclerosis. No appreciable airspace consolidation or pulmonary edema. No evidence of pleural effusion or pneumothorax. No acute osseous abnormality identified. IMPRESSION: 1.  No evidence of acute cardiopulmonary abnormality. 2.  Aortic Atherosclerosis (ICD10-I70.0). Electronically Signed   By: Jackey Loge D.O.   On: 01/08/2023 08:28   CT ANGIO HEAD NECK W WO CM  Result Date: 01/08/2023 CLINICAL DATA:  82 year old female with blurred vision. Unrevealing brain MRI this morning. EXAM: CT ANGIOGRAPHY HEAD AND NECK WITH AND WITHOUT CONTRAST TECHNIQUE: Multidetector CT imaging of the head and neck was performed  using the standard protocol during bolus administration of intravenous contrast. Multiplanar CT image reconstructions and MIPs were obtained to evaluate the vascular anatomy. Carotid stenosis measurements (when applicable) are obtained utilizing NASCET criteria, using the distal internal carotid diameter as the denominator. RADIATION DOSE REDUCTION: This exam was performed according to the departmental dose-optimization program which includes automated exposure control, adjustment of the mA and/or kV according to patient size and/or use of iterative reconstruction technique. CONTRAST:  75mL OMNIPAQUE IOHEXOL 350 MG/ML SOLN COMPARISON:   Brain MRI 0139 hours today.  Head CT yesterday. FINDINGS: CTA NECK Skeleton: Left TMJ degeneration. Cervical spine degeneration, mostly left side facet arthropathy, superimposed on multilevel degenerative appearing cervical spondylolisthesis. Left maxillary sinus mucoperiosteal thickening. Congenital incomplete ossification of the posterior C1 ring. No acute osseous abnormality identified. Upper chest: Negative visible mediastinum, visible central pulmonary arteries are enhancing and appear patent. Mild pulmonary atelectasis bilaterally, possibly superimposed on centrilobular emphysema. Other neck: Necklace artifact at the thoracic inlet. Otherwise negative. Aortic arch: 3 vessel arch. Mild to moderate for age Calcified aortic atherosclerosis. Right carotid system: Minor brachiocephalic artery plaque without stenosis. Tortuous right CCA origin. Mild to moderate calcified plaque at the right ICA origin and bulb without stenosis. Left carotid system: Similar tortuosity and mild proximal ICA plaque without stenosis. Vertebral arteries: Negative proximal right subclavian artery. Calcified atherosclerosis at the right vertebral artery origin on series 5 image 239. No significant stenosis there (series 8, image 173) and additional right V1 calcified plaque with mild associated stenosis (series 5, image 231). Right vertebral artery than mildly tortuous to the skull base with no additional plaque or stenosis. Minimal proximal left subclavian artery plaque. Calcified plaque at the left vertebral artery origin with mild to moderate stenosis on series 8, image 174. Tortuous left V1 segment. Mildly non dominant left vertebral artery with V2 tortuosity and mild calcified plaque. No additional significant stenosis to the skull base. CTA HEAD Posterior circulation: Mildly dominant right V4 segment. No distal vertebral or vertebrobasilar junction plaque or stenosis. Normal PICA origins. Patent basilar artery without stenosis.  Patent SCA and PCA origins. Both posterior communicating arteries are present. Bilateral PCA branches are within normal limits. Anterior circulation: Both ICA siphons are patent. Moderate left siphon calcified plaque, but some underlying siphon dolichoectasia and no significant stenosis. Normal left posterior communicating artery origin. Similar right ICA siphon tortuosity with moderate calcified plaque but no significant right siphon stenosis. Normal right posterior communicating artery origin. Both ophthalmic artery origins appear patent, normal. Patent carotid termini, MCA and ACA origins. Normal anterior communicating artery. Bilateral ACA branches are within normal limits. Left MCA M1 segment and bifurcation are patent without stenosis. Right MCA M1 segment and bifurcation are patent without stenosis. Bilateral MCA branches are within normal limits. Venous sinuses: Early contrast timing, grossly patent. Anatomic variants: Mildly dominant right vertebral artery. Review of the MIP images confirms the above findings IMPRESSION: 1. Negative for large vessel occlusion. 2. No significant extracranial carotid plaque or stenosis. Up to moderate bilateral ICA siphon calcified plaque, but underlying ICA siphon ectasia. Subsequently no significant anterior circulation stenosis. Ophthalmic arteries appear normal. 3. Calcified atherosclerosis of both proximal Vertebral Arteries. Up to Moderate Left Vertebral Artery origin stenosis. But no other significant posterior circulation stenosis. 4.  Aortic Atherosclerosis (ICD10-I70.0).  Questionable Emphysema. 5. Electronically Signed   By: Odessa Fleming M.D.   On: 01/08/2023 05:48   MR BRAIN WO CONTRAST  Result Date: 01/08/2023 CLINICAL DATA:  Blurry vision EXAM: MRI HEAD WITHOUT CONTRAST  TECHNIQUE: Multiplanar, multiecho pulse sequences of the brain and surrounding structures were obtained without intravenous contrast. COMPARISON:  None Available. FINDINGS: Brain: No acute  infarct, mass effect or extra-axial collection. Chronic microhemorrhage in the left temporal lobe. There is multifocal hyperintense T2-weighted signal within the white matter. Generalized volume loss. The midline structures are normal. Vascular: Major flow voids are preserved. Skull and upper cervical spine: Normal calvarium and skull base. Visualized upper cervical spine and soft tissues are normal. Sinuses/Orbits:No paranasal sinus fluid levels or advanced mucosal thickening. No mastoid or middle ear effusion. Normal orbits. IMPRESSION: 1. No acute intracranial abnormality. 2. Findings of chronic small vessel ischemia and volume loss. Electronically Signed   By: Deatra Robinson M.D.   On: 01/08/2023 01:56   CT HEAD WO CONTRAST  Result Date: 01/07/2023 CLINICAL DATA:  Blurred vision. EXAM: CT HEAD WITHOUT CONTRAST TECHNIQUE: Contiguous axial images were obtained from the base of the skull through the vertex without intravenous contrast. RADIATION DOSE REDUCTION: This exam was performed according to the departmental dose-optimization program which includes automated exposure control, adjustment of the mA and/or kV according to patient size and/or use of iterative reconstruction technique. COMPARISON:  February 24, 2013 FINDINGS: Brain: There is mild cerebral atrophy with widening of the extra-axial spaces and ventricular dilatation. There are areas of decreased attenuation within the white matter tracts of the supratentorial brain, consistent with microvascular disease changes. Vascular: No hyperdense vessel or unexpected calcification. Skull: Normal. Negative for fracture or focal lesion. Sinuses/Orbits: No acute finding. Other: None. IMPRESSION: 1. No acute intracranial abnormality. 2. Generalized cerebral atrophy and microvascular disease changes of the supratentorial brain. Electronically Signed   By: Aram Candela M.D.   On: 01/07/2023 20:51    Pending Labs Unresulted Labs (From admission, onward)      Start     Ordered   01/09/23 0500  Basic metabolic panel  Tomorrow morning,   R        01/08/23 0755   01/09/23 0500  CBC  Daily,   R      01/08/23 1200   01/09/23 0500  Heparin level (unfractionated)  Daily,   R      01/08/23 1200   01/08/23 2000  Heparin level (unfractionated)  Once-Timed,   TIMED        01/08/23 1200   01/08/23 0742  Basic metabolic panel  Once,   R        01/08/23 0755   01/08/23 0739  Protime-INR  Daily,   R      01/08/23 0738   01/08/23 0614  Lipid panel  (Labs)  Add-on,   AD       Comments: Fasting    01/08/23 0614   01/08/23 0614  Hemoglobin A1c  (Labs)  Add-on,   AD       Comments: To assess prior glycemic control    01/08/23 0614            Vitals/Pain Today's Vitals   01/08/23 0828 01/08/23 0830 01/08/23 0845 01/08/23 1211  BP: (!) 147/91 (!) 148/88 (!) 145/87 (!) 150/99  Pulse: 63 65 69 71  Resp: 20 19 20 20   Temp: 98.7 F (37.1 C)   97.8 F (36.6 C)  TempSrc: Oral   Oral  SpO2: 99% 99% 100% 98%  Weight:      PainSc:        Isolation Precautions No active isolations  Medications Medications  LORazepam (ATIVAN) tablet 0.5 mg (has no administration in time  range)  acetaminophen (TYLENOL) tablet 650 mg (has no administration in time range)    Or  acetaminophen (TYLENOL) suppository 650 mg (has no administration in time range)   stroke: early stages of recovery book (has no administration in time range)  amLODipine (NORVASC) tablet 10 mg (10 mg Oral Given 01/08/23 0930)  carvedilol (COREG) tablet 25 mg (25 mg Oral Given 01/08/23 0930)  hydrALAZINE (APRESOLINE) tablet 100 mg (100 mg Oral Given 01/08/23 0930)  pregabalin (LYRICA) capsule 25 mg (25 mg Oral Not Given 01/08/23 0930)  senna-docusate (Senokot-S) tablet 1 tablet (has no administration in time range)  aspirin EC tablet 81 mg (81 mg Oral Given 01/08/23 1312)  clopidogrel (PLAVIX) tablet 75 mg (75 mg Oral Given 01/08/23 1313)  heparin ADULT infusion 100 units/mL (25000 units/256mL)  (800 Units/hr Intravenous New Bag/Given 01/08/23 1315)  warfarin (COUMADIN) tablet 9 mg (has no administration in time range)  Warfarin - Pharmacist Dosing Inpatient (has no administration in time range)  nicotine (NICODERM CQ - dosed in mg/24 hours) patch 14 mg (has no administration in time range)  atorvastatin (LIPITOR) tablet 80 mg (80 mg Oral Given 01/08/23 1312)  LORazepam (ATIVAN) tablet 0.5 mg (0.5 mg Oral Given 01/08/23 0121)  hydrALAZINE (APRESOLINE) injection 5 mg (5 mg Intravenous Given 01/08/23 0537)  iohexol (OMNIPAQUE) 350 MG/ML injection 75 mL (75 mLs Intravenous Contrast Given 01/08/23 0532)  calcium gluconate 1 g/ 50 mL sodium chloride IVPB (0 mg Intravenous Stopped 01/08/23 1230)  heparin bolus via infusion 3,000 Units (3,000 Units Intravenous Bolus from Bag 01/08/23 1316)    Mobility walks     Focused Assessments Neuro Assessment Handoff:  Swallow screen pass? Yes  Cardiac Rhythm: Normal sinus rhythm NIH Stroke Scale  Dizziness Present: No Headache Present: No Interval: Initial Level of Consciousness (1a.)   : Alert, keenly responsive LOC Questions (1b. )   : Answers both questions correctly LOC Commands (1c. )   : Performs both tasks correctly Best Gaze (2. )  : Normal Visual (3. )  : No visual loss Facial Palsy (4. )    : Normal symmetrical movements Motor Arm, Left (5a. )   : No drift Motor Arm, Right (5b. ) : No drift Motor Leg, Left (6a. )  : No drift Motor Leg, Right (6b. ) : No drift Limb Ataxia (7. ): Absent Sensory (8. )  : Normal, no sensory loss Best Language (9. )  : No aphasia Dysarthria (10. ): Normal Extinction/Inattention (11.)   : No Abnormality Complete NIHSS TOTAL: 0     Neuro Assessment:   Neuro Checks:   Initial (01/08/23 0700)  Has TPA been given? No If patient is a Neuro Trauma and patient is going to OR before floor call report to 4N Charge nurse: [phone] or [phone]   R Recommendations: See Admitting Provider  Note  Report given to:   Additional Notes:

## 2023-01-08 NOTE — ED Notes (Signed)
Pt states lyrica is a newer prescription and she doesn't want to take it until she knows it has nothing to do with what happened to her this visit.

## 2023-01-08 NOTE — Progress Notes (Signed)
PT Cancellation Note  Patient Details Name: Faith Gray MRN: [id number] DOB: [birth year]   Cancelled Treatment:    Reason Eval/Treat Not Completed: PT screened, no needs identified, will sign off. Pt observed ambulating independently with OT, tolerating dynamic gait challenges well. Per discussion with OT pt has no current acute PT needs. Acute PT signing off.   Arlyss Gandy 01/08/2023, 9:14 AM

## 2023-01-08 NOTE — Evaluation (Signed)
Occupational Therapy Evaluation Patient Details Name: Faith Gray MRN: [id number] DOB: Jun 29, 1941 Today's Date: 01/08/2023   History of Present Illness 82 y.o. female presents to [facility] hospital on 01/07/2023 with sudden onset double vision. MRI negative for acute changes, cannot rule out small brainstem stroke that MRI missed. PMH includes CAD, HTN, LV aneurysm, chronic systolic HF.   Clinical Impression   PT admitted with diplopia. Pt currently with functional limitiations due to the deficits listed below (see OT problem list). Pt at baseline indep with all adls and does not drive. Pt provided taped len on the R side for diplopia this session. Pt noted to have monocular vision after taping with increased balance. Handouts with family/ patient education provided this session.  Pt will benefit from skilled OT to increase their independence and safety with adls and balance to allow discharge outpatient OT for vision needs.       Recommendations for follow up therapy are one component of a multi-disciplinary discharge planning process, led by the attending physician.  Recommendations may be updated based on patient status, additional functional criteria and insurance authorization.   Assistance Recommended at Discharge PRN  Patient can return home with the following Assist for transportation    Functional Status Assessment  Patient has had a recent decline in their functional status and demonstrates the ability to make significant improvements in function in a reasonable and predictable amount of time.  Equipment Recommendations  None recommended by OT    Recommendations for Other Services       Precautions / Restrictions Precautions Precautions: Fall Precaution Comments: vision compensatory strategies given      Mobility Bed Mobility Overal bed mobility: Independent                  Transfers Overall transfer level: Independent                        Balance                                            ADL either performed or assessed with clinical judgement   ADL Overall ADL's : Needs assistance/impaired Eating/Feeding: Modified independent   Grooming: Wash/dry face   Upper Body Bathing: Modified independent   Lower Body Bathing: Modified independent   Upper Body Dressing : Modified independent   Lower Body Dressing: Modified independent   Toilet Transfer: Modified Independent           Functional mobility during ADLs: Modified independent General ADL Comments: pt with education on visual compensatory strategies but could benefit from family (A) initially with return home for lighting, removal of throw rugs and setup.     Vision Baseline Vision/History: 1 Wears glasses Ability to See in Adequate Light: 1 Impaired Patient Visual Report: Diplopia Vision Assessment?: Yes Eye Alignment: Impaired (comment) Ocular Range of Motion: Restricted on the right Alignment/Gaze Preference: Head turned Tracking/Visual Pursuits: Decreased smoothness of horizontal tracking;Impaired - to be further tested in functional context Saccades: Decreased speed of saccadic movement Convergence: Impaired (comment) Diplopia Assessment: Disappears with one eye closed;Present all the time/all directions Additional Comments: pt with occlusion provided to glasses on the R lens as pt was R eye dominant. pt noted to have decreased R eye lateral movement. pt reports no longer having diplopia with lens occlusion. pt with handouts provided and tape to help  with progression of glasses being taped. family present for all education     Perception Perception Perception Tested?: No Comments: decreased depth perception at this time   Praxis      Pertinent Vitals/Pain Pain Assessment Pain Assessment: No/denies pain     Hand Dominance Right   Extremity/Trunk Assessment Upper Extremity Assessment Upper Extremity Assessment: Generalized weakness    Lower Extremity Assessment Lower Extremity Assessment: Overall WFL for tasks assessed   Cervical / Trunk Assessment Cervical / Trunk Assessment: Normal   Communication Communication Communication: No difficulties   Cognition Arousal/Alertness: Awake/alert Behavior During Therapy: WFL for tasks assessed/performed Overall Cognitive Status: Within Functional Limits for tasks assessed                                       General Comments       Exercises Exercises: Other exercises Other Exercises Other Exercises: vision handout for compensatory strategy education Other Exercises: diplopia handout with tape provided   Shoulder Instructions      Home Living Family/patient expects to be discharged to:: Private residence Living Arrangements: Other (Comment) (granddaughter) Available Help at Discharge: Available PRN/intermittently (granddaughter works and travels for work) Type of Home: [employer] Home Access: Stairs to enter Secretary/administrator of Steps: 3 Entrance Stairs-Rails: Can reach both Home Layout: Two level;Able to live on main level with bedroom/bathroom     Bathroom Shower/Tub: Producer, television/film/video: Standard Bathroom Accessibility: Yes How Accessible: Accessible via walker Home Equipment: None   Additional Comments: no animals      Prior Functioning/Environment Prior Level of Function : Independent/Modified Independent               ADLs Comments: does not drive,        OT Problem List: Impaired vision/perception      OT Treatment/Interventions:      OT Goals(Current goals can be found in the care plan section) Acute Rehab OT Goals Patient Stated Goal: to go home OT Goal Formulation: With patient/family Time For Goal Achievement: 01/22/23 Potential to Achieve Goals: Good  OT Frequency:      Co-evaluation              AM-PAC OT "6 Clicks" Daily Activity     Outcome Measure Help from another person eating  meals?: None Help from another person taking care of personal grooming?: None Help from another person toileting, which includes using toliet, bedpan, or urinal?: None Help from another person bathing (including washing, rinsing, drying)?: None Help from another person to put on and taking off regular upper body clothing?: None Help from another person to put on and taking off regular lower body clothing?: None 6 Click Score: 24   End of Session Equipment Utilized During Treatment: Gait belt Nurse Communication: Mobility status;Precautions  Activity Tolerance: Patient tolerated treatment well Patient left: in bed;with call bell/phone within reach;with family/visitor present  OT Visit Diagnosis: Low vision, both eyes (H54.2)                Time: 1610-9604 OT Time Calculation (min): 31 min Charges:  OT General Charges $OT Visit: 1 Visit OT Evaluation $OT Eval Moderate Complexity: 1 Mod OT Treatments $Self Care/Home Management : 8-22 mins   Faith Gray, OTR/L  Acute Rehabilitation Services Office: [phone] .   Mateo Flow 01/08/2023, 10:39 AM

## 2023-01-08 NOTE — ED Notes (Signed)
Pt ambulated to bathroom with no difficulties.  

## 2023-01-08 NOTE — Progress Notes (Signed)
  Carryover admission to the Day Admitter.  I discussed this case with the EDP, Dr. Clayborne Dana.  Per these discussions:   This is a 82 year old female who is being admitted for additional stroke workup after presenting to [facility] emergency department by way of [facility] emergency department at presenting to [facility] complaining of double vision.  She notes that she went to sleep on the evening of Wednesday, 01/03/2023 at her baseline state of health, before awakening on the morning of Thursday, 01/04/2023, with interval development of double vision, which has been persistent since onset.  Otherwise, denies any other acute focal neurologic deficits.  Hoping that the diplopia wound intermittently resolved, she did not seek any medical evaluation until presenting to [facility] emergency department earlier this evening.   EDP at [facility] long Discussed patient's case with on-call neurology, recommended ED transfer for MRI brain.   During MRI brain reportedly shows no evidence of acute infarct.  EDP discussed patient's case with on-call neurology, who will formally consult and has evaluated the patient in the ED, noting a defect in the right abducens nerve and subsequently recommending [facility] admission for further stroke workup.  .  I have placed an order for observation to med/tele for further evaluation management of the above.  I have placed some additional preliminary admit orders via the adult multi-morbid admission order set. I have also completed the acute ischemic stroke order set, including ordering lipid panel, hemoglobin A1c level, PT/OT consults.  Also ordered echocardiogram for the morning.    Newton Pigg, DO Hospitalist

## 2023-01-08 NOTE — H&P (Addendum)
History and Physical    PatientMargorie Gray ZOX:[id number] DOB: 1941-01-23 DOA: 01/07/2023 DOS: the patient was seen and examined on 01/08/2023 PCP: [name], PA-C  Patient coming from: Transfer from [facility]  Chief Complaint:  Chief Complaint  Patient presents with   Blurred Vision   HPI: Faith Gray is a 82 y.o. female with medical history significant of hypertension, heart failure with reduced EF 30-35% with grade 1 DD, CAD, anemia, chronic kidney disease stage IIIb who presents with complaints of double vision over the last 4 days. Patient has been in her normal state of health when she went to sleep on 4/17.  Noted associated symptoms of blurriness.  She noted that symptoms seem to be worse out of her right eye.  Denied having any headache, facial droop, slurred speech, focal weakness, fever, palpitations, chest pain, nausea, vomiting, diarrhea, bleeding, or falls related to her symptoms.  She reports that she has been compliant with her Coumadin and has been on the medication since 2012.  When asked if patient was wanting to switch to Eliquis she declined.  Patient here admit to still smoking cigarettes intermittently.  Patient had initially presented to [facility] emergency department was noted to be afebrile with pulse 54-85, blood pressures elevated up to 188/93, and all other vital signs maintained.  Labs from 4/21 noted hemoglobin 9.1, BUN 29, creatinine 1.6, calcium 8.6, ionized calcium 1.01, and INR 1.4.  CT scan of the head noted no acute intracranial abnormality.  Urinalysis noted small hemoglobin, negative leukocytes, negative nitrite, 11-20 RBCs/hpf, no bacteria seen, and 0-5 WBCs.  Patient was transferred from [facility] to [facility] due to need of MRI overnight.  MRI did not reveal any acute intracranial abnormality and chronic small vessel ischemia with volume loss.  CT angiogram of the head and neck did not reveal any large vessel occlusion or significant  extracranial carotid plaque or stenosis.  Patient has been given Ativan for imaging and hydralazine 5 mg IV for blood pressures.   Review of Systems: As mentioned in the history of present illness. All other systems reviewed and are negative. Past Medical History:  Diagnosis Date   Anemia    Arthritis    CAD (coronary artery disease) 09/11/2012   CHF (congestive heart failure)    Dental caries    Essential hypertension 10/04/2012   Ischemic cardiomyopathy    NSTEMI (non-ST elevated myocardial infarction) 09/11/2012   Shortness of breath    Past Surgical History:  Procedure Laterality Date   CARDIAC CATHETERIZATION     LEFT AND RIGHT HEART CATHETERIZATION WITH CORONARY ANGIOGRAM N/A 09/10/2012   Procedure: LEFT AND RIGHT HEART CATHETERIZATION WITH CORONARY ANGIOGRAM;  Surgeon: Dolores Patty, MD;  Location: [facility] CATH LAB;  Service: Cardiovascular;  Laterality: N/A;   MULTIPLE EXTRACTIONS WITH ALVEOLOPLASTY N/A 02/16/2017   Procedure: MULTIPLE EXTRACTION WITH ALVEOLOPLASTY;  Surgeon: [name], DDS;  Location: [facility] OR;  Service: Oral Surgery;  Laterality: N/A;   Social History:  reports that she has quit smoking. Her smoking use included cigarettes. She has a 10.00 pack-year smoking history. She has never used smokeless tobacco. She reports that she does not drink alcohol and does not use drugs.  Allergies  Allergen Reactions   Vicodin [Hydrocodone-Acetaminophen] Nausea And Vomiting    Family History  Problem Relation Age of Onset   Sickle cell anemia Brother     Prior to Admission medications   Medication Sig Start Date End Date Taking? Authorizing Provider  amLODipine (NORVASC) 10 MG tablet Take 1 tablet (10 mg total) by mouth daily. 04/05/22  Yes Bensimhon, Bevelyn Buckles, MD  carvedilol (COREG) 25 MG tablet Take 25 mg by mouth 2 (two) times daily with a meal.   Yes [provider]  ENTRESTO 97-103 MG TAKE 1 TABLET TWICE DAILY  (DOSE  CHANGE) Patient taking differently:  Take 1 tablet by mouth 2 (two) times daily. 11/24/21  Yes Bensimhon, Bevelyn Buckles, MD  hydrALAZINE (APRESOLINE) 100 MG tablet TAKE 1 TABLET THREE TIMES DAILY ( DOSE INCREASE ) Patient taking differently: Take 100 mg by mouth 3 (three) times daily. 08/14/22  Yes Bensimhon, Bevelyn Buckles, MD  isosorbide mononitrate (IMDUR) 30 MG 24 hr tablet Take 30 mg by mouth daily.   Yes [provider]  JARDIANCE 10 MG TABS tablet TAKE 1 TABLET EVERY DAY Patient taking differently: Take 10 mg by mouth daily. 09/26/21  Yes Bensimhon, Bevelyn Buckles, MD  pregabalin (LYRICA) 25 MG capsule Take 25 mg by mouth 2 (two) times daily.   Yes [provider]  spironolactone (ALDACTONE) 25 MG tablet TAKE 1 TABLET EVERY DAY 04/17/22  Yes Bensimhon, Bevelyn Buckles, MD  TYLENOL 500 MG tablet Take 500-1,000 mg by mouth every 6 (six) hours as needed for mild pain or headache.   Yes [provider]  warfarin (COUMADIN) 6 MG tablet TAKE 1 TABLET TO 1 & 1/2 TABLETS DAILY AS DIRECTED BY THE COUMADIN CLINIC Patient taking differently: Take 6-9 mg by mouth See admin instructions. Take 6 mg by mouth at bedtime on Sun/Tues/Wed/Fri and 9 mg on Mon/Thurs/Sat 04/12/22  Yes Bensimhon, Bevelyn Buckles, MD  atorvastatin (LIPITOR) 80 MG tablet TAKE 1 TABLET BY MOUTH DAILY AT 6PM Patient not taking: Reported on 01/07/2023 05/19/19   Bensimhon, Bevelyn Buckles, MD  carvedilol (COREG) 12.5 MG tablet TAKE 1 AND 1/2 TABLETS TWO TIMES DAILY WITH MEALS. Patient not taking: Reported on 01/07/2023 05/15/22   Bensimhon, Bevelyn Buckles, MD  isosorbide mononitrate (IMDUR) 60 MG 24 hr tablet TAKE 1 AND 1/2  TABLETS   DAILY. Patient not taking: Reported on 01/07/2023 09/26/21   Bensimhon, Bevelyn Buckles, MD  furosemide (LASIX) 40 MG tablet Take 1 tablet (40 mg total) by mouth as needed. 02/05/18 04/13/20  Bensimhon, Bevelyn Buckles, MD    Physical Exam: Vitals:   01/07/23 2300 01/08/23 0017 01/08/23 0533 01/08/23 0545  BP: (!) 163/87 (!) 173/82 (!) 169/90 (!) 152/91  Pulse: (!) 57 (!) 57 60 69   Resp:  Temp:  97.7 F (36.5 C) 97.7 F (36.5 C)   TempSrc:  Oral Oral   SpO2: 100% 100% 100% 100%  Weight:       Constitutional: Elderly female currently in no acute distress Eyes: PERRL, currently wearing glasses with makeshift patch over the right lens ENMT: Mucous membranes are moist. Posterior pharynx clear of any exudate or lesions.   Neck: normal, supple  Respiratory: clear to auscultation bilaterally, no wheezing, no crackles. Normal respiratory effort. No accessory muscle use.  Cardiovascular: Regular rate and rhythm, no murmurs / rubs / gallops. No extremity edema. 2+ pedal pulses.   Abdomen: no tenderness, no masses palpated. Bowel sounds positive.  Musculoskeletal: no clubbing / cyanosis. No joint deformity upper and lower extremities. Good ROM, no contractures. Normal muscle tone.  Skin: no rashes, lesions, ulcers. No induration Neurologic: CN 2-12 grossly intact. Sensation intact. Strength 5/5 in all 4.  Psychiatric: Normal judgment and insight. Alert and oriented x 3. Normal mood.  Data Reviewed:  EKG revealed a sinus rhythm at 63 bpm with LVH with secondary repolarization and ST wave changes without significant change from priors.  Reviewed labs, imaging, and pertinent records as noted above in HPI.  Assessment and Plan:  Diplopia Prior to arrival.  Patient presented with complaints of double vision which initially started 4 days ago.  Neurology has been consulted.  MRI of the brain was negative for any acute abnormality, but make note small brainstem infarcts can be missed with current slice thickness parameters. -Admit to a telemetry bed -Stroke order set utilized -Check lipid panel and hemoglobin A1c -Check echocardiogram -Check chest x-ray -PT/OT to evaluation and treat -Continue atorvastatin and Coumadin continue anticoagulation -Appreciate neurology consultative services will follow-up for further recommendations  Subtherapeutic INR Aneurysm of  the left ventricle Patient with a history of aneurysm of the left ventricle for which she is on anticoagulation.  INR subtherapeutic at 1.4, but had been intermittently subtherapeutic previously in the past.  Case was discussed with Dr. Gala Romney via secure message who was okay with the patient switching to Eliquis or continuing with Coumadin if she wished.  Patient wishes to continue on Coumadin -Heparin drip per pharmacy -Continue Coumadin per pharmacy  Essential hypertension Initial blood pressure was noted to be elevated up to 188/93.  Home blood pressure regimen appears to include Coreg 25 mg twice daily, amlodipine 10 mg daily, Entresto 97-103 mg twice daily, hydralazine 100 mg twice daily, Imdur 30 mg daily, and spironolactone 12.5 mg daily. -Continue Coreg, amlodipine, hydralazine, and Imdur -Held spironolactone and Entresto due to bump in kidney function and recent contrast given  Hypocalcemia Acute.  Calcium noted to be 8.6 with ionized calcium 1.01 labs obtained yesterday. -Give 1 g of calcium gluconate IV -Continue to monitor and replace as needed  Renal insufficiency superimposed on chronic kidney disease stage IIIb Patient noted to have creatinine elevated up to 1.6 with BUN 29 when checked yesterday.  Baseline creatinine had been around 1.44 when last checked on care everywhere records from approximately 4 months ago.  The small elevation in kidney function does not meet criteria for acute kidney injury at this time. -Hold possible nephrotoxic agents given recent contrast dye -Continue to monitor kidney function daily  Heart failure with reduced ejection fraction Last echocardiogram from 10/2020 noted EF to be 30-35% with grade 1 diastolic dysfunction and aneurysmal deformation of the left ventricle. -Strict I&Os and daily weights -Follow-up echocardiogram  Normocytic anemia Chronic.  Hemoglobin 9.1 which appears slightly lower than priors.  Vital signs otherwise  stable. -Recheck CBC tomorrow morning  Coronary artery disease -Continue statin  Hyperlipidemia -Follow-up lipid panel -Continue atorvastatin  MGUS Patient has been being worked up by Dr. Welton Flakes of hematology oncology in the outpatient setting for MGUS. -Continue outpatient follow-up  Sciatica -continue Lyrica  DVT prophylaxis: Heparin Advance Care Planning:   Code Status: Full Code    Consults: Neurology   Family Communication: Family updated at bedside  Severity of Illness: The appropriate patient status for this patient is OBSERVATION. Observation status is judged to be reasonable and necessary in order to provide the required intensity of service to ensure the patient's safety. The patient's presenting symptoms, physical exam findings, and initial radiographic and laboratory data in the context of their medical condition is felt to place them at decreased risk for further clinical deterioration. Furthermore, it is anticipated that the patient will be medically stable for discharge from the hospital within 2 midnights of admission.  Author: Clydie Braun, MD 01/08/2023 7:14 AM  For on call review [email or web address].

## 2023-01-08 NOTE — Progress Notes (Signed)
ANTICOAGULATION CONSULT NOTE - Initial Consult  Pharmacy Consult for Heparin/Warfarin Indication:  LV aneursym - warfarin PTA  Allergies  Allergen Reactions   Vicodin [Hydrocodone-Acetaminophen] Nausea And Vomiting    Patient Measurements: Weight: 49.4 kg (109 lb) Heparin Dosing Weight: 49 kg  Vital Signs: Temp: 98.7 F (37.1 C) (04/22 0828) Temp Source: Oral (04/22 0828) BP: 145/87 (04/22 0845) Pulse Rate: 69 (04/22 0845)  Labs: Recent Labs    01/07/23 1930 01/07/23 1955  HGB 9.1* 8.8*  HCT 28.5* 26.0*  PLT 328  --   APTT 42*  --   LABPROT 17.3*  --   INR 1.4*  --   CREATININE 1.60* 1.60*    CrCl cannot be calculated (Unknown ideal weight.).   Medical History: Past Medical History:  Diagnosis Date   Anemia    Arthritis    CAD (coronary artery disease) 09/11/2012   CHF (congestive heart failure)    Dental caries    Essential hypertension 10/04/2012   Ischemic cardiomyopathy    NSTEMI (non-ST elevated myocardial infarction) 09/11/2012   Shortness of breath     Assessment: 18 YOF presenting with TIA. History of LV aneurysm on warfarin. INR subtherapeutic on admission - pharmacy consulted to start heparin as a bridge until INR therapeutic on warfarin.  Warfarin PTA regimen:  Sun/Tues/Wed/Fri and  AOD. Last dose 4/18 PM  CBC WNL - INR 1.4  Goal of Therapy:  INR 2-3 Heparin level 0.3-0.7 units/ml Monitor platelets by anticoagulation protocol: Yes   Plan:  Heparin 3000 units IV once then 800 units/hr Warfarin  PO once Heparin level at 2000 Daily CBC/heparin level/INR  Continue heparin until INR therapeutic  Ellis Savage, PharmD Clinical Pharmacist 01/08/2023,11:56 AM

## 2023-01-09 ENCOUNTER — Other Ambulatory Visit (HOSPITAL_COMMUNITY): Payer: Self-pay

## 2023-01-09 ENCOUNTER — Inpatient Hospital Stay (HOSPITAL_COMMUNITY): Payer: Medicare HMO

## 2023-01-09 DIAGNOSIS — I6389 Other cerebral infarction: Secondary | ICD-10-CM | POA: Diagnosis not present

## 2023-01-09 DIAGNOSIS — N1832 Chronic kidney disease, stage 3b: Secondary | ICD-10-CM | POA: Diagnosis not present

## 2023-01-09 DIAGNOSIS — I10 Essential (primary) hypertension: Secondary | ICD-10-CM | POA: Diagnosis not present

## 2023-01-09 DIAGNOSIS — H532 Diplopia: Secondary | ICD-10-CM | POA: Diagnosis not present

## 2023-01-09 LAB — CBC
HCT: 23.6 % — ABNORMAL LOW (ref 36.0–46.0)
Hemoglobin: 7.7 g/dL — ABNORMAL LOW (ref 12.0–15.0)
MCH: 28.4 pg (ref 26.0–34.0)
MCHC: 32.6 g/dL (ref 30.0–36.0)
MCV: 87.1 fL (ref 80.0–100.0)
Platelets: 290 10*3/uL (ref 150–400)
RBC: 2.71 MIL/uL — ABNORMAL LOW (ref 3.87–5.11)
RDW: 13.9 % (ref 11.5–15.5)
WBC: 3.8 10*3/uL — ABNORMAL LOW (ref 4.0–10.5)
nRBC: 0 % (ref 0.0–0.2)

## 2023-01-09 LAB — BASIC METABOLIC PANEL
Anion gap: 8 (ref 5–15)
BUN: 19 mg/dL (ref 8–23)
CO2: 20 mmol/L — ABNORMAL LOW (ref 22–32)
Calcium: 8.4 mg/dL — ABNORMAL LOW (ref 8.9–10.3)
Chloride: 106 mmol/L (ref 98–111)
Creatinine, Ser: 1.53 mg/dL — ABNORMAL HIGH (ref 0.44–1.00)
GFR, Estimated: 34 mL/min — ABNORMAL LOW (ref >60)
Glucose, Bld: 93 mg/dL (ref 70–99)
Potassium: 4.1 mmol/L (ref 3.5–5.1)
Sodium: 134 mmol/L — ABNORMAL LOW (ref 135–145)

## 2023-01-09 LAB — ECHOCARDIOGRAM COMPLETE
AR max vel: 1.5 cm2
AV Peak grad: 13.7 mmHg
Ao pk vel: 1.85 m/s
Area-P 1/2: 3.12 cm2
Height: 59 in
S' Lateral: 4.3 cm
Weight: 1744 oz

## 2023-01-09 LAB — PROTIME-INR
INR: 1.4 — ABNORMAL HIGH (ref 0.8–1.2)
Prothrombin Time: 16.6 seconds — ABNORMAL HIGH (ref 11.4–15.2)

## 2023-01-09 LAB — HEPARIN LEVEL (UNFRACTIONATED): Heparin Unfractionated: 0.48 IU/mL (ref 0.30–0.70)

## 2023-01-09 MED ORDER — APIXABAN 2.5 MG PO TABS
2.5000 mg | ORAL_TABLET | Freq: Two times a day (BID) | ORAL | Status: DC
  Administered 2023-01-09: 2.5 mg via ORAL
  Filled 2023-01-09: qty 1

## 2023-01-09 MED ORDER — EZETIMIBE 10 MG PO TABS
10.0000 mg | ORAL_TABLET | Freq: Every day | ORAL | 11 refills | Status: DC
  Filled 2023-01-09: qty 30, 30d supply, fill #0

## 2023-01-09 MED ORDER — APIXABAN 2.5 MG PO TABS
2.5000 mg | ORAL_TABLET | Freq: Two times a day (BID) | ORAL | 3 refills | Status: DC
  Filled 2023-01-09: qty 60, 30d supply, fill #0

## 2023-01-09 MED ORDER — PERFLUTREN LIPID MICROSPHERE
1.0000 mL | INTRAVENOUS | Status: AC | PRN
  Administered 2023-01-09: 4 mL via INTRAVENOUS
  Filled 2023-01-09: qty 10

## 2023-01-09 NOTE — Plan of Care (Signed)

## 2023-01-09 NOTE — Progress Notes (Signed)
Echocardiogram 2D Echocardiogram has been performed.  Lucendia Herrlich 01/09/2023, 9:22 AM

## 2023-01-09 NOTE — TOC Transition Note (Signed)
Transition of Care [facility]) - CM/SW Discharge Note   Patient Details  Name: Faith Gray MRN: [id number] Date of Birth: 1941/05/24  [facility] CM/SW Contact:  Kermit Balo, RN Phone Number: 01/09/2023, 11:43 AM   Clinical Narrative:    Pt is from home with her daughter and granddaughter. She says someone is with her most of the time.  She doesn't use any DME.  She manages her own medications and denies any issues.  Pharmacy: [facility] on [address]. Outpatient OT arranged at [facility] outpt. Information on the AVS. Pt will need to call and schedule the first appointment and she is aware.  Pt has transportation home.   Final next level of care: OP Rehab Barriers to Discharge: No Barriers Identified   Patient Goals and CMS Choice   Choice offered to / list presented to : Patient  Discharge Placement                         Discharge Plan and Services Additional resources added to the After Visit Summary for                                       Social Determinants of Health (SDOH) Interventions SDOH Screenings   Food Insecurity: No Food Insecurity (01/08/2023)  Housing: Low Risk  (01/08/2023)  Transportation Needs: No Transportation Needs (01/08/2023)  Utilities: Not At Risk (01/08/2023)  Tobacco Use: Medium Risk (01/07/2023)     Readmission Risk Interventions     No data to display

## 2023-01-09 NOTE — Discharge Summary (Signed)
Physician Discharge Summary   Patient: Faith Gray MRN: [id number] DOB: 1941/06/02  Admit date:     01/07/2023  Discharge date: 01/09/23  Discharge Physician: Alberteen Sam   PCP: [name], PA-C     Recommendations at discharge:  Follow up with PCP Raenette Rover in 1 week for new stroke Follow up with [facility] Neurology for stroke in 1 week Follow up with Cardiology Dr. Gala Romney for LV aneurysm on new Eliquis     Discharge Diagnoses: Principal Problem:   Diplopia due to small vessel ischemic stroke Other hospital problems:   Essential hypertension   Hypocalcemia   Chronic kidney disease, stage 3b   Chronic systolic congestive heart failure   Normochromic anemia   CAD (coronary artery disease)   Hyperlipidemia   MGUS (monoclonal gammopathy of unknown significance)      Hospital Course: Faith Gray is an 82 y.o. F with CAD, LV aneurysm on warfarin, HTN, sCHF EF 40%, CKD IIIb, smoking who presented with 4 days diplopia.    Acute small vessel stroke MRI showed no acute finding but extensive small vessel disease.  Neurology were consulted and suspected her diplopia was from acute small vessel stroke due to smoking, hyperlipidemia, subtherapeutic INR.  -Non-invasive angiography showed no large vessel occlusion; carotids without major stenosis -Echocardiogram showed no cardiogenic source of embolism -Lipids ordered: discharged on hoem atorvastatin 80 and new Zetia -Aspirin ordered at admission --> discussed with Cardiology and neurology, and given subtherapeutic INR, discharged OFF warfarin and on new Eliquis 2.5 bid -Atrial fibrillation: not present -tPA not given because outside window -Dysphagia screen ordered in ER -PT eval ordered: recommended outpatient OT -Smoking cessation: recommended   Coronary artery disease with LV aneurysm See above.  INR 1.1 on admission.    Transitioned to Eliquis 2.5 BID.  Discussed with patient and grandson.  25  minutes were spent with the patient, in counseling about risk/benefit of anticoagulation and DOAC.        The [facility] Controlled Substances Registry was reviewed for this patient prior to discharge.  Consultants: Neurology Procedures performed:  MRI brain CTA head and neck Echocardiogram   Disposition: Home Diet recommendation:  Discharge Diet Orders (From admission, onward)     Start     Ordered   01/09/23 0000  Diet - low sodium heart healthy        01/09/23 1213             DISCHARGE MEDICATION: Allergies as of 01/09/2023       Reactions   Vicodin [hydrocodone-acetaminophen] Nausea And Vomiting        Medication List     STOP taking these medications    warfarin 6 MG tablet Commonly known as: COUMADIN       TAKE these medications    amLODipine 10 MG tablet Commonly known as: NORVASC Take 1 tablet (10 mg total) by mouth daily.   atorvastatin 80 MG tablet Commonly known as: LIPITOR TAKE 1 TABLET BY MOUTH DAILY AT 6PM   carvedilol 25 MG tablet Commonly known as: COREG Take 25 mg by mouth 2 (two) times daily with a meal.   Eliquis 2.5 MG Tabs tablet Generic drug: apixaban Take 1 tablet (2.5 mg total) by mouth 2 (two) times daily.   Entresto 97-103 MG Generic drug: sacubitril-valsartan TAKE 1 TABLET TWICE DAILY  (DOSE  CHANGE) What changed: See the new instructions.   ezetimibe 10 MG tablet Commonly known as: Zetia Take 1 tablet (10 mg total)  by mouth daily.   hydrALAZINE 100 MG tablet Commonly known as: APRESOLINE TAKE 1 TABLET THREE TIMES DAILY ( DOSE INCREASE ) What changed: See the new instructions.   isosorbide mononitrate 30 MG 24 hr tablet Commonly known as: IMDUR Take 30 mg by mouth daily.   Jardiance 10 MG Tabs tablet Generic drug: empagliflozin TAKE 1 TABLET EVERY DAY What changed: how much to take   pregabalin 25 MG capsule Commonly known as: LYRICA Take 25 mg by mouth 2 (two) times daily.   spironolactone  25 MG tablet Commonly known as: ALDACTONE TAKE 1 TABLET EVERY DAY   TYLENOL 500 MG tablet Generic drug: acetaminophen Take 500-1,000 mg by mouth every 6 (six) hours as needed for mild pain or headache.        Follow-up Information     [name], PA-C. Call in 1 day.   Specialty: Physician Assistant Why: discuss your visit, see when they want to see you in the office Contact information: [address] [place] [address] [phone]         [facility]. Schedule an appointment as soon as possible for a visit in 1 week(s).   Specialty: Rehabilitation Contact information: [address] [place], [address] [id number] mc [place] [address] [phone]        Bensimhon, Bevelyn Buckles, MD Follow up.   Specialty: Cardiology Contact information: [address] [place] [address] [phone]         [facility] .   Contact information: [address]     [address] [place] [address] [phone]                Discharge Instructions     Ambulatory referral to Neurology   Complete by: As directed    Acute diploplia CN VI palsy on exam   Ambulatory referral to Occupational Therapy   Complete by: As directed    Diet - low sodium heart healthy   Complete by: As directed    Discharge instructions   Complete by: As directed    **IMPORTANT DISCHARGE INSTRUCTIONS**   From Dr. Maryfrances Bunnell: You were admitted for a small stroke. This caused double vision. To help deal with this, we recommend you follow up with the Occupational Therapists (see below "Neuro Rehab" in the To Do section) for outpatient occupational therapy and more tips/tricks/techniques like the glasses at their rehab center  In addition: switch from warfarin to the more consistent blood thinner Eliquis/apixaban  Take apixaban/Eliquis 2.5 mg twice daily START the new  cholesterol medicine ezetimibe/Zetia 10 mg daily Very few side effects and complements your Lipitor well  Go see the Neurologists in 4-6 weeks If your vision is still affected at that time, they can help you with prism glasses They should call you, but if you don't hear from them in a week, call their office at the number listed below in the To Do section ([facility])  Lastly, go see your primary care doctor at your upcoming appointment Go see Dr. Gala Romney soon too, to discuss the blood thinner and how it is working   Increase activity slowly   Complete by: As directed        Discharge Exam: Filed Weights   01/07/23 1926  Weight: 49.4 kg    General: Pt is alert, awake, not in acute distress Cardiovascular: RRR, nl S1-S2, no murmurs appreciated.   No LE edema.  Respiratory: Normal respiratory rate and rhythm.  CTAB without rales or wheezes. Abdominal: Abdomen soft and non-tender.  No distension or HSM.   Neuro/Psych: Strength symmetric in upper and lower extremities.  Judgment and insight appear normal.   Condition at discharge: good  The results of significant diagnostics from this hospitalization (including imaging, microbiology, ancillary and laboratory) are listed below for reference.   Imaging Studies: ECHOCARDIOGRAM COMPLETE  Result Date: 01/09/2023    ECHOCARDIOGRAM REPORT   Patient Name:   Faith Gray Date of Exam: 01/09/2023 Medical Rec #:  [id number]     Height:       59.0 in Accession #:    [id number]    Weight:       109.0 lb Date of Birth:  [birth year]     BSA:          1.425 m Patient Age:    82 years      BP:           133/74 mmHg Patient Gender: F             HR:           52 bpm. Exam Location:  Inpatient Procedure: 2D Echo, Cardiac Doppler, Color Doppler and Intracardiac            Opacification Agent Indications:    Stroke I63.9  History:        Patient has prior history of Echocardiogram examinations, most                 recent 11/01/2020.  CHF, CAD and Previous Myocardial Infarction,                 TIA, Signs/Symptoms:Chest Pain, Shortness of Breath and Syncope;                 Risk Factors:Hypertension and Dyslipidemia. CKD, stage 3.  Sonographer:    Lucendia Herrlich Referring Phys: [id number] JUSTIN B HOWERTER IMPRESSIONS  1. There is no left ventricular thrombus (Definity contrast was used). Left ventricular ejection fraction, by estimation, is 35 to 40%. The left ventricle has moderately decreased function. The left ventricle demonstrates regional wall motion abnormalities (see scoring diagram/findings for description). Left ventricular diastolic parameters are consistent with Grade I diastolic dysfunction (impaired relaxation).  2. Right ventricular systolic function is normal. The right ventricular size is normal.  3. The mitral valve is normal in structure. Mild mitral valve regurgitation. No evidence of mitral stenosis.  4. The aortic valve is tricuspid. There is mild calcification of the aortic valve. Aortic valve regurgitation is not visualized. Aortic valve sclerosis is present, with no evidence of aortic valve stenosis.  5. Aortic dilatation noted. There is moderate dilatation of the suprarenal abdominal aorta, measuring 3.1 mm.  6. The inferior vena cava is normal in size with greater than 50% respiratory variability, suggesting right atrial pressure of 3 mmHg. Comparison(s): No significant change from prior study. Prior images reviewed side by side. FINDINGS  Left Ventricle: There is no left ventricular thrombus (Definity contrast was used). Left ventricular ejection fraction, by estimation, is 35 to 40%. The left ventricle has moderately decreased function. The left ventricle demonstrates regional wall motion abnormalities. Definity contrast agent was given IV to delineate the left ventricular endocardial borders. The left ventricular internal cavity size was normal in size. There is no left ventricular hypertrophy. Left ventricular  diastolic parameters are consistent with Grade I diastolic dysfunction (impaired relaxation). Normal left ventricular filling pressure.  LV Wall Scoring:  The inferior wall and basal inferolateral segment are aneurysmal. The mid inferolateral segment and basal inferoseptal segment are akinetic. Right Ventricle: The right ventricular size is normal. No increase in right ventricular wall thickness. Right ventricular systolic function is normal. Left Atrium: Left atrial size was normal in size. Right Atrium: Right atrial size was normal in size. Pericardium: There is no evidence of pericardial effusion. Mitral Valve: The mitral valve is normal in structure. Mild mitral annular calcification. Mild mitral valve regurgitation, with centrally-directed jet. No evidence of mitral valve stenosis. Tricuspid Valve: The tricuspid valve is normal in structure. Tricuspid valve regurgitation is not demonstrated. No evidence of tricuspid stenosis. Aortic Valve: The aortic valve is tricuspid. There is mild calcification of the aortic valve. Aortic valve regurgitation is not visualized. Aortic valve sclerosis is present, with no evidence of aortic valve stenosis. Aortic valve peak gradient measures 13.7 mmHg. Pulmonic Valve: The pulmonic valve was normal in structure. Pulmonic valve regurgitation is not visualized. No evidence of pulmonic stenosis. Aorta: The aortic root and ascending aorta are structurally normal, with no evidence of dilitation and aortic dilatation noted. There is moderate dilatation of the suprarenal abdominal aorta, measuring 3.1 mm. Venous: The inferior vena cava is normal in size with greater than 50% respiratory variability, suggesting right atrial pressure of 3 mmHg. IAS/Shunts: No atrial level shunt detected by color flow Doppler.  LEFT VENTRICLE PLAX 2D LVIDd:         5.00 cm      Diastology LVIDs:         4.30 cm      LV e' medial:    0.04 cm/s LV PW:         1.23 cm      LV E/e' medial:  21.9 LV IVS:         0.80 cm      LV e' lateral:   0.07 cm/s LVOT diam:     1.90 cm      LV E/e' lateral: 12.6 LV SV:         67 LV SV Index:   47 LVOT Area:     2.84 cm  LV Volumes (MOD) LV vol d, MOD A4C: 122.0 ml LV SV MOD A4C:     122.0 ml RIGHT VENTRICLE             IVC RV S prime:     12.70 cm/s  IVC diam: 1.90 cm TAPSE (M-mode): 1.6 cm LEFT ATRIUM             Index        RIGHT ATRIUM           Index LA diam:        4.10 cm 2.88 cm/m   RA Area:     12.30 cm LA Vol (A2C):   71.5 ml 50.18 ml/m  RA Volume:   26.80 ml  18.81 ml/m LA Vol (A4C):   49.9 ml 35.02 ml/m LA Biplane Vol: 65.6 ml 46.04 ml/m  AORTIC VALVE AV Area (Vmax): 1.50 cm AV Vmax:        185.00 cm/s AV Peak Grad:   13.7 mmHg LVOT Vmax:      97.60 cm/s LVOT Vmean:     64.500 cm/s LVOT VTI:       0.237 m  AORTA Ao Root diam: 3.10 cm Ao Asc diam:  3.00 cm MITRAL VALVE MV Area (PHT): 3.12 cm     SHUNTS MV Decel Time: 243 msec  Systemic VTI:  0.24 m MV E velocity: 0.92 cm/s    Systemic Diam: 1.90 cm MV A velocity: 138.00 cm/s MV E/A ratio:  0.01 Mihai Croitoru MD Electronically signed by Thurmon Fair MD Signature Date/Time: 01/09/2023/10:07:01 AM    Final    DG CHEST PORT 1 VIEW  Result Date: 01/08/2023 CLINICAL DATA:  Provided history: Abducens (sixth) nerve injury. Blurred vision (left greater than right). EXAM: PORTABLE CHEST 1 VIEW COMPARISON:  Prior chest radiographs 09/07/2012 and earlier. FINDINGS: Heart size within normal limits. Aortic atherosclerosis. No appreciable airspace consolidation or pulmonary edema. No evidence of pleural effusion or pneumothorax. No acute osseous abnormality identified. IMPRESSION: 1.  No evidence of acute cardiopulmonary abnormality. 2.  Aortic Atherosclerosis (ICD10-I70.0). Electronically Signed   By: Jackey Loge D.O.   On: 01/08/2023 08:28   CT ANGIO HEAD NECK W WO CM  Result Date: 01/08/2023 CLINICAL DATA:  82 year old female with blurred vision. Unrevealing brain MRI this morning. EXAM: CT ANGIOGRAPHY HEAD AND  NECK WITH AND WITHOUT CONTRAST TECHNIQUE: Multidetector CT imaging of the head and neck was performed using the standard protocol during bolus administration of intravenous contrast. Multiplanar CT image reconstructions and MIPs were obtained to evaluate the vascular anatomy. Carotid stenosis measurements (when applicable) are obtained utilizing NASCET criteria, using the distal internal carotid diameter as the denominator. RADIATION DOSE REDUCTION: This exam was performed according to the departmental dose-optimization program which includes automated exposure control, adjustment of the mA and/or kV according to patient size and/or use of iterative reconstruction technique. CONTRAST:  75mL OMNIPAQUE IOHEXOL 350 MG/ML SOLN COMPARISON:  Brain MRI 0139 hours today.  Head CT yesterday. FINDINGS: CTA NECK Skeleton: Left TMJ degeneration. Cervical spine degeneration, mostly left side facet arthropathy, superimposed on multilevel degenerative appearing cervical spondylolisthesis. Left maxillary sinus mucoperiosteal thickening. Congenital incomplete ossification of the posterior C1 ring. No acute osseous abnormality identified. Upper chest: Negative visible mediastinum, visible central pulmonary arteries are enhancing and appear patent. Mild pulmonary atelectasis bilaterally, possibly superimposed on centrilobular emphysema. Other neck: Necklace artifact at the thoracic inlet. Otherwise negative. Aortic arch: 3 vessel arch. Mild to moderate for age Calcified aortic atherosclerosis. Right carotid system: Minor brachiocephalic artery plaque without stenosis. Tortuous right CCA origin. Mild to moderate calcified plaque at the right ICA origin and bulb without stenosis. Left carotid system: Similar tortuosity and mild proximal ICA plaque without stenosis. Vertebral arteries: Negative proximal right subclavian artery. Calcified atherosclerosis at the right vertebral artery origin on series 5 image 239. No significant stenosis  there (series 8, image 173) and additional right V1 calcified plaque with mild associated stenosis (series 5, image 231). Right vertebral artery than mildly tortuous to the skull base with no additional plaque or stenosis. Minimal proximal left subclavian artery plaque. Calcified plaque at the left vertebral artery origin with mild to moderate stenosis on series 8, image 174. Tortuous left V1 segment. Mildly non dominant left vertebral artery with V2 tortuosity and mild calcified plaque. No additional significant stenosis to the skull base. CTA HEAD Posterior circulation: Mildly dominant right V4 segment. No distal vertebral or vertebrobasilar junction plaque or stenosis. Normal PICA origins. Patent basilar artery without stenosis. Patent SCA and PCA origins. Both posterior communicating arteries are present. Bilateral PCA branches are within normal limits. Anterior circulation: Both ICA siphons are patent. Moderate left siphon calcified plaque, but some underlying siphon dolichoectasia and no significant stenosis. Normal left posterior communicating artery origin. Similar right ICA siphon tortuosity with moderate calcified plaque but no significant right siphon  stenosis. Normal right posterior communicating artery origin. Both ophthalmic artery origins appear patent, normal. Patent carotid termini, MCA and ACA origins. Normal anterior communicating artery. Bilateral ACA branches are within normal limits. Left MCA M1 segment and bifurcation are patent without stenosis. Right MCA M1 segment and bifurcation are patent without stenosis. Bilateral MCA branches are within normal limits. Venous sinuses: Early contrast timing, grossly patent. Anatomic variants: Mildly dominant right vertebral artery. Review of the MIP images confirms the above findings IMPRESSION: 1. Negative for large vessel occlusion. 2. No significant extracranial carotid plaque or stenosis. Up to moderate bilateral ICA siphon calcified plaque, but  underlying ICA siphon ectasia. Subsequently no significant anterior circulation stenosis. Ophthalmic arteries appear normal. 3. Calcified atherosclerosis of both proximal Vertebral Arteries. Up to Moderate Left Vertebral Artery origin stenosis. But no other significant posterior circulation stenosis. 4.  Aortic Atherosclerosis (ICD10-I70.0).  Questionable Emphysema. 5. Electronically Signed   By: Odessa Fleming M.D.   On: 01/08/2023 05:48   MR BRAIN WO CONTRAST  Result Date: 01/08/2023 CLINICAL DATA:  Blurry vision EXAM: MRI HEAD WITHOUT CONTRAST TECHNIQUE: Multiplanar, multiecho pulse sequences of the brain and surrounding structures were obtained without intravenous contrast. COMPARISON:  None Available. FINDINGS: Brain: No acute infarct, mass effect or extra-axial collection. Chronic microhemorrhage in the left temporal lobe. There is multifocal hyperintense T2-weighted signal within the white matter. Generalized volume loss. The midline structures are normal. Vascular: Major flow voids are preserved. Skull and upper cervical spine: Normal calvarium and skull base. Visualized upper cervical spine and soft tissues are normal. Sinuses/Orbits:No paranasal sinus fluid levels or advanced mucosal thickening. No mastoid or middle ear effusion. Normal orbits. IMPRESSION: 1. No acute intracranial abnormality. 2. Findings of chronic small vessel ischemia and volume loss. Electronically Signed   By: Deatra Robinson M.D.   On: 01/08/2023 01:56   CT HEAD WO CONTRAST  Result Date: 01/07/2023 CLINICAL DATA:  Blurred vision. EXAM: CT HEAD WITHOUT CONTRAST TECHNIQUE: Contiguous axial images were obtained from the base of the skull through the vertex without intravenous contrast. RADIATION DOSE REDUCTION: This exam was performed according to the departmental dose-optimization program which includes automated exposure control, adjustment of the mA and/or kV according to patient size and/or use of iterative reconstruction technique.  COMPARISON:  February 24, 2013 FINDINGS: Brain: There is mild cerebral atrophy with widening of the extra-axial spaces and ventricular dilatation. There are areas of decreased attenuation within the white matter tracts of the supratentorial brain, consistent with microvascular disease changes. Vascular: No hyperdense vessel or unexpected calcification. Skull: Normal. Negative for fracture or focal lesion. Sinuses/Orbits: No acute finding. Other: None. IMPRESSION: 1. No acute intracranial abnormality. 2. Generalized cerebral atrophy and microvascular disease changes of the supratentorial brain. Electronically Signed   By: Aram Candela M.D.   On: 01/07/2023 20:51    Microbiology: No results found for this or any previous visit.  Labs: CBC: Recent Labs  Lab 01/07/23 1930 01/07/23 1955 01/09/23 0457  WBC 4.0  --  3.8*  NEUTROABS 1.3*  --   --   HGB 9.1* 8.8* 7.7*  HCT 28.5* 26.0* 23.6*  MCV 88.0  --  87.1  PLT 328  --  290   Basic Metabolic Panel: Recent Labs  Lab 01/07/23 1930 01/07/23 1955 01/08/23 1622 01/09/23 0457  NA 137 137 133* 134*  K 4.2 5.2* 4.2 4.1  CL 102 108 100 106  CO2 22  --  20* 20*  GLUCOSE 84 73 80 93  BUN 29* 36*  22 19  CREATININE 1.60* 1.60* 1.45* 1.53*  CALCIUM 8.6*  --  9.2 8.4*   Liver Function Tests: Recent Labs  Lab 01/07/23 1930  AST 31  ALT 30  ALKPHOS 104  BILITOT 0.6  PROT 8.6*  ALBUMIN 3.9   CBG: No results for input(s): "GLUCAP" in the last 168 hours.  Discharge time spent: approximately 35 minutes spent on discharge counseling, evaluation of patient on day of discharge, and coordination of discharge planning with nursing, social work, pharmacy and case management  Signed: Alberteen Sam, MD [facility] 01/09/2023

## 2023-01-09 NOTE — Plan of Care (Signed)

## 2023-01-09 NOTE — Progress Notes (Signed)
ANTICOAGULATION CONSULT NOTE - Follow-up Consult  Pharmacy Consult for Heparin/Warfarin >> Eliquis Indication:  LV aneursym - warfarin PTA  Allergies  Allergen Reactions   Vicodin [Hydrocodone-Acetaminophen] Nausea And Vomiting    Patient Measurements: Height:  (149.9 cm) Weight: 49.4 kg (109 lb) IBW/kg (Calculated) : 43.2 Heparin Dosing Weight: 49 kg  Vital Signs: Temp: 98.5 F (36.9 C) (04/23 0751) Temp Source: Oral (04/23 0751) BP: 153/90 (04/23 0959) Pulse Rate: 68 (04/23 0959)  Labs: Recent Labs    01/07/23 1930 01/07/23 1955 01/08/23 1622 01/08/23 1952 01/09/23 0457  HGB 9.1* 8.8*  --   --  7.7*  HCT 28.5* 26.0*  --   --  23.6*  PLT 328  --   --   --  290  APTT 42*  --   --   --   --   LABPROT 17.3*  --  14.4  --  16.6*  INR 1.4*  --  1.1  --  1.4*  HEPARINUNFRC  --   --   --  0.25* 0.48  CREATININE 1.60* 1.60* 1.45*  --  1.53*     Estimated Creatinine Clearance: 19.7 mL/min (A) (by C-G formula based on SCr of 1.53 mg/dL (H)).   Medical History: Past Medical History:  Diagnosis Date   Anemia    Arthritis    CAD (coronary artery disease) 09/11/2012   CHF (congestive heart failure)    Dental caries    Essential hypertension 10/04/2012   Ischemic cardiomyopathy    NSTEMI (non-ST elevated myocardial infarction) 09/11/2012   Shortness of breath     Assessment: 18 YOF presenting with TIA. History of LV aneurysm on warfarin. INR subtherapeutic on admission and patient was transitioned to IV heparin. Neurology and cardiology okay to switch warfarin to Eliquis and patient agreeable. Pharmacy consulted to transition IV heparin/warfarin to Eliquis for hx LV clot.  INR today 1.4. INR < 2 so ok to give Eliquis doses starting today. Weight < 60kg and age > 108. Scr baseline between 1.1-1.4. Dose of Eliquis will be 2.5mg  BID - rec's for LV clot are similar to AF dosing reductions.   Goal of Therapy:  Monitor platelets by anticoagulation protocol:  Yes  Plan:  Start Eliquis 2.5mg  PO BID D/c heparin drip and associated labs/orders Pharmacy to provide education at discharge Copay for Eliquis $0  Rexford Maus, PharmD, BCPS 01/09/2023 10:31 AM

## 2023-01-09 NOTE — Progress Notes (Signed)
ANTICOAGULATION CONSULT NOTE - Follow-up Consult  Pharmacy Consult for Heparin/Warfarin Indication:  LV aneursym - warfarin PTA  Allergies  Allergen Reactions   Vicodin [Hydrocodone-Acetaminophen] Nausea And Vomiting    Patient Measurements: Height:  (149.9 cm) Weight: 49.4 kg (109 lb) IBW/kg (Calculated) : 43.2 Heparin Dosing Weight: 49 kg  Vital Signs: Temp: 98.5 F (36.9 C) (04/23 0418) Temp Source: Oral (04/23 0418) BP: 133/74 (04/23 0418) Pulse Rate: 77 (04/23 0418)  Labs: Recent Labs    01/07/23 1930 01/07/23 1955 01/08/23 1622 01/08/23 1952 01/09/23 0457  HGB 9.1* 8.8*  --   --  7.7*  HCT 28.5* 26.0*  --   --  23.6*  PLT 328  --   --   --  290  APTT 42*  --   --   --   --   LABPROT 17.3*  --  14.4  --  16.6*  INR 1.4*  --  1.1  --  1.4*  HEPARINUNFRC  --   --   --  0.25* 0.48  CREATININE 1.60* 1.60* 1.45*  --  1.53*     Estimated Creatinine Clearance: 19.7 mL/min (A) (by C-G formula based on SCr of 1.53 mg/dL (H)).   Medical History: Past Medical History:  Diagnosis Date   Anemia    Arthritis    CAD (coronary artery disease) 09/11/2012   CHF (congestive heart failure)    Dental caries    Essential hypertension 10/04/2012   Ischemic cardiomyopathy    NSTEMI (non-ST elevated myocardial infarction) 09/11/2012   Shortness of breath     Assessment: 51 YOF presenting with TIA. History of LV aneurysm on warfarin. INR subtherapeutic on admission - pharmacy consulted to start heparin as a bridge until INR therapeutic on warfarin.  Warfarin PTA regimen:  Sun/Tues/Wed/Fri and  AOD. Last dose 4/18 PM  Goal of Therapy:  INR 2-3 Heparin level 0.3-0.7 units/ml Monitor platelets by anticoagulation protocol: Yes   4/23 AM Update HL therapeutic at 0.48 after heparin 700unit bolus x 1 and an infusion rate of 900units/hr. INR subtherapeutic - 1.4 Hgb 8.8>>7.7, Plts 290, No noted bleeding.  Plan:  Continue heparin infusion at a rate of  900units/hr Recheck HL in 8 hours Daily PT/INR and CBC  Monitor for signs/symptoms of bleeding  Continue heparin until INR therapeutic  Fredirick Lathe, Student Pharmacist 01/09/2023 5:57 AM

## 2023-01-09 NOTE — TOC Benefit Eligibility Note (Signed)
Patient Advocate Encounter  Insurance verification completed.    The patient is currently admitted and upon discharge could be taking Eliquis 5 mg.  The current 30 day co-pay is $0.00.   The patient is insured through [employer] Medicare Part D   This test claim was processed through [facility]- copay amounts may vary at other pharmacies due to pharmacy/plan contracts, or as the patient moves through the different stages of their insurance plan.  Sheilla Maris, CPHT Pharmacy Patient Advocate Specialist [facility] Patient Advocate Team Direct Number: [phone]  Fax: [phone]       

## 2023-01-09 NOTE — Progress Notes (Signed)
Removed IN and reviewed DC instructions. Pt voiced understanding of  DC instructions. Volunteer rolled pt off unit. Pt had all her belongings with her.

## 2023-01-09 NOTE — Progress Notes (Signed)
Occupational Therapy Treatment Patient Details Name: Faith Gray MRN: [id number] DOB: [birth year] Today's Date: 01/09/2023   History of present illness 82 y.o. female presents to [facility] hospital on 01/07/2023 with sudden onset double vision. MRI negative for acute changes, cannot rule out small brainstem stroke that MRI missed. PMH includes CAD, HTN, LV aneurysm, chronic systolic HF.   OT comments  Educated on taping glasses and progressed exercises with visual pursuits this session. Pt with good return demo. Pt verbalized frustration with visual changes and concerns that the vision will not resolve. Pt again educated that time and blood pressure control are key to helping with recovery. Recommendation for outpatient continued to be appropriate.    Recommendations for follow up therapy are one component of a multi-disciplinary discharge planning process, led by the attending physician.  Recommendations may be updated based on patient status, additional functional criteria and insurance authorization.    Assistance Recommended at Discharge PRN  Patient can return home with the following  Assist for transportation   Equipment Recommendations  None recommended by OT    Recommendations for Other Services      Precautions / Restrictions Precautions Precautions: Fall Precaution Comments: vision compensatory strategies given       Mobility Bed Mobility Overal bed mobility: Independent                  Transfers Overall transfer level: Independent                       Balance                                           ADL either performed or assessed with clinical judgement   ADL                                         General ADL Comments: pt just returning from bathroom with supervision from tech in room    Extremity/Trunk Assessment              Vision   Additional Comments: worked on visual pursuits from L visual field  with glasses and no occlusion present. Pt was able to locate a area in the lower L quadrant that was single vision. Pt used a straw with a taped (A) on the top to work on visual pursuits until it became double image. pt was enouraged to hold that single image at the edge of becoming two images. Pt was educated on taping glasses back for R len.   Perception     Praxis      Cognition Arousal/Alertness: Awake/alert Behavior During Therapy: WFL for tasks assessed/performed Overall Cognitive Status: Within Functional Limits for tasks assessed                                          Exercises Other Exercises Other Exercises: pt expressed frustration with double vision. Pt asking about patch and educated on taping glasses allows her to provide light and movement to the R eye. pt offered patched and declined after secondary education about taping glasses.    Shoulder Instructions       General Comments  Pertinent Vitals/ Pain       Pain Assessment Pain Assessment: No/denies pain  Home Living                                          Prior Functioning/Environment              Frequency  Min 2X/week        Progress Toward Goals  OT Goals(current goals can now be found in the care plan section)  Progress towards OT goals: Progressing toward goals  Acute Rehab OT Goals Patient Stated Goal: to fix my vision OT Goal Formulation: With patient/family Time For Goal Achievement: 01/22/23 Potential to Achieve Goals: Good ADL Goals Additional ADL Goal #1: Pt will demonstrates how to progress taping on lens with family (A) min ()A Additional ADL Goal #2: pt will verbalize 2 compensatory strategies for home  Plan Discharge plan remains appropriate    Co-evaluation                 AM-PAC OT "6 Clicks" Daily Activity     Outcome Measure   Help from another person eating meals?: None Help from another person taking care of personal  grooming?: None Help from another person toileting, which includes using toliet, bedpan, or urinal?: None Help from another person bathing (including washing, rinsing, drying)?: None Help from another person to put on and taking off regular upper body clothing?: None Help from another person to put on and taking off regular lower body clothing?: None 6 Click Score: 24    End of Session    OT Visit Diagnosis: Low vision, both eyes (H54.2)   Activity Tolerance Patient tolerated treatment well   Patient Left in bed;with call bell/phone within reach   Nurse Communication Mobility status;Weight bearing status        Time: 1135-1146 OT Time Calculation (min): 11 min  Charges: OT General Charges $OT Visit: 1 Visit OT Treatments $Neuromuscular Re-education: 8-22 mins   Brynn, OTR/L  Acute Rehabilitation Services Office: [phone] .   Mateo Flow 01/09/2023, 12:31 PM

## 2023-01-12 ENCOUNTER — Telehealth: Payer: Self-pay | Admitting: *Deleted

## 2023-01-12 ENCOUNTER — Ambulatory Visit: Payer: Medicare HMO

## 2023-01-12 NOTE — Telephone Encounter (Signed)
Pt called to report to was started on Eliquis while in the Hospital.  Pt reports she had a stoke. Per discharge summary it states: Diplopia due to small vessel ischemic stroke and follow up with Cardiology Dr. Gala Romney for LV aneurysm on new Eliquis. Discontinued Anticoagulation Track at this time.

## 2023-01-16 ENCOUNTER — Ambulatory Visit: Payer: Medicare HMO | Admitting: Neurology

## 2023-01-16 ENCOUNTER — Ambulatory Visit (INDEPENDENT_AMBULATORY_CARE_PROVIDER_SITE_OTHER): Payer: Medicare HMO | Admitting: Neurology

## 2023-01-16 ENCOUNTER — Encounter: Payer: Self-pay | Admitting: Neurology

## 2023-01-16 VITALS — BP 172/78 | HR 50 | Ht 59.0 in | Wt 106.8 lb

## 2023-01-16 DIAGNOSIS — H4921 Sixth [abducent] nerve palsy, right eye: Secondary | ICD-10-CM

## 2023-01-16 DIAGNOSIS — I25119 Atherosclerotic heart disease of native coronary artery with unspecified angina pectoris: Secondary | ICD-10-CM | POA: Diagnosis not present

## 2023-01-16 DIAGNOSIS — I509 Heart failure, unspecified: Secondary | ICD-10-CM | POA: Diagnosis not present

## 2023-01-16 DIAGNOSIS — H532 Diplopia: Secondary | ICD-10-CM

## 2023-01-16 DIAGNOSIS — I10 Essential (primary) hypertension: Secondary | ICD-10-CM

## 2023-01-16 NOTE — Progress Notes (Signed)
[facility]  PATIENT: Alencia Gordon DOB: 1940-09-23  REFERRING DOCTOR OR PCP: Melene Plan, DO SOURCE: Patient, notes from primary care, imaging reports, MRI images personally reviewed.  _________________________________   HISTORICAL  CHIEF COMPLAINT:  Chief Complaint  Patient presents with   New Patient (Initial Visit)    Pt in room 10. New patient  Acute diploplia in right eye started 01/04/23, pt said still has double vision in right eye only. Pt has a visit with eye MD next week.    HISTORY OF PRESENT ILLNESS:  I had the pleasure of seeing a patient, Leta Speller, at [facility] for neurologic consultation regarding her diplopia.  She is an 82 year old woman who noted diplopia on 01/05/2023.  She noted side by side binocular diplopia.  She noted that the right eye did not look completely to the right.  At the onset, she had some eye pressure but not actual pain.  She feels she is doing the same now as 2 weeks ago.    She noted no other new symptoms besides the diplopia.  Vision out of either eye is fine.  She denies other neurologic symptoms.  Specifically, gait is stable though she feels she walks better a few years ago.  She has no numbness or weakness.  Bladder function is fine.  Patient went to the emergency room last week, a type of mask and tape was provided and placed on the nasal half of the right lens.  I told her it might help better to place on the temporal half and I went ahead and moved it over with benefit.  Blood pressure is elevated today.  She did not take her hypertension medication this morning as prescribed. Vascular risks:   She has HTN and CAD,  She has CHF.  She is on on a cholesterol medication for her heart (did not have elevated lipids but placed on after MI in 2012), she was a 1/2 a pack per day smoker x 15 years in the past, none x many years (quit completely after MI in 2012).    She does not have DM and HgbA1c was  4.7.  Imaging:  MRI of the brain 01/08/2023 showed age-appropriate mild generalized cortical atrophy.  There was mild chronic microvascular ischemic change with foci in the cerebral hemispheres and pons.  No acute findings.  Orbits appear normal.    REVIEW OF SYSTEMS: Constitutional: No fevers, chills, sweats, or change in appetite Eyes: as above Ear, nose and throat: No hearing loss, ear pain, nasal congestion, sore throat Cardiovascular: No chest pain, palpitations.  History of CHF and CAD as above. Respiratory:  No shortness of breath at rest or with exertion.   No wheezes GastrointestinaI: No nausea, vomiting, diarrhea, abdominal pain, fecal incontinence Genitourinary:  No dysuria, urinary retention or frequency.  No nocturia. Musculoskeletal:  No neck pain, back pain Integumentary: No rash, pruritus, skin lesions Neurological: as above Psychiatric: No depression at this time.  No anxiety Endocrine: No palpitations, diaphoresis, change in appetite, change in weigh or increased thirst Hematologic/Lymphatic:  No anemia, purpura, petechiae. Allergic/Immunologic: No itchy/runny eyes, nasal congestion, recent allergic reactions, rashes  ALLERGIES: Allergies  Allergen Reactions   Vicodin [Hydrocodone-Acetaminophen] Nausea And Vomiting    HOME MEDICATIONS:  Current Outpatient Medications:    amLODipine (NORVASC) 10 MG tablet, Take 1 tablet (10 mg total) by mouth daily., Disp: 90 tablet, Rfl: 3   apixaban (ELIQUIS) 2.5 MG TABS tablet, Take 1 tablet (2.5 mg total) by  mouth 2 (two) times daily., Disp: 60 tablet, Rfl: 3   atorvastatin (LIPITOR) 80 MG tablet, TAKE 1 TABLET BY MOUTH DAILY AT 6PM, Disp: 90 tablet, Rfl: 1   carvedilol (COREG) 25 MG tablet, Take 25 mg by mouth 2 (two) times daily with a meal., Disp: , Rfl:    ENTRESTO 97-103 MG, TAKE 1 TABLET TWICE DAILY  (DOSE  CHANGE) (Patient taking differently: Take 1 tablet by mouth 2 (two) times daily.), Disp: 180 tablet, Rfl: 3    ezetimibe (ZETIA) 10 MG tablet, Take 1 tablet (10 mg total) by mouth daily., Disp: 30 tablet, Rfl: 11   hydrALAZINE (APRESOLINE) 100 MG tablet, TAKE 1 TABLET THREE TIMES DAILY ( DOSE INCREASE ) (Patient taking differently: Take 100 mg by mouth 3 (three) times daily.), Disp: 270 tablet, Rfl: 3   isosorbide mononitrate (IMDUR) 30 MG 24 hr tablet, Take 30 mg by mouth daily., Disp: , Rfl:    JARDIANCE 10 MG TABS tablet, TAKE 1 TABLET EVERY DAY (Patient taking differently: Take 10 mg by mouth daily.), Disp: 90 tablet, Rfl: 3   pregabalin (LYRICA) 25 MG capsule, Take 25 mg by mouth 2 (two) times daily., Disp: , Rfl:    spironolactone (ALDACTONE) 25 MG tablet, TAKE 1 TABLET EVERY DAY, Disp: 90 tablet, Rfl: 11   TYLENOL 500 MG tablet, Take 500-1,000 mg by mouth every 6 (six) hours as needed for mild pain or headache., Disp: , Rfl:   PAST MEDICAL HISTORY: Past Medical History:  Diagnosis Date   Anemia    Arthritis    CAD (coronary artery disease) 09/11/2012   CHF (congestive heart failure) (HCC)    Dental caries    Essential hypertension 10/04/2012   Ischemic cardiomyopathy    NSTEMI (non-ST elevated myocardial infarction) (HCC) 09/11/2012   Shortness of breath     PAST SURGICAL HISTORY: Past Surgical History:  Procedure Laterality Date   CARDIAC CATHETERIZATION     LEFT AND RIGHT HEART CATHETERIZATION WITH CORONARY ANGIOGRAM N/A 09/10/2012   Procedure: LEFT AND RIGHT HEART CATHETERIZATION WITH CORONARY ANGIOGRAM;  Surgeon: Dolores Patty, MD;  Location: [facility] CATH LAB;  Service: Cardiovascular;  Laterality: N/A;   MULTIPLE EXTRACTIONS WITH ALVEOLOPLASTY N/A 02/16/2017   Procedure: MULTIPLE EXTRACTION WITH ALVEOLOPLASTY;  Surgeon: [name], DDS;  Location: [facility] OR;  Service: Oral Surgery;  Laterality: N/A;    FAMILY HISTORY: Family History  Problem Relation Age of Onset   Sickle cell anemia Brother     SOCIAL HISTORY: Social History   Socioeconomic History   Marital status: Single     Spouse name: Not on file   Number of children: 2   Years of education: Not on file   Highest education level: Not on file  Occupational History   Not on file  Tobacco Use   Smoking status: Former    Packs/day: 0.50    Years: 20.00    Additional pack years: 0.00    Total pack years: 10.00    Types: Cigarettes   Smokeless tobacco: Never  Vaping Use   Vaping Use: Never used  Substance and Sexual Activity   Alcohol use: No   Drug use: No   Sexual activity: Never  Other Topics Concern   Not on file  Social History Narrative   Right handed   Wear glasses    Drinks coffee one cup in am   Social Determinants of Health   Financial Resource Strain: Not on file  Food Insecurity: No Food Insecurity (01/08/2023)  Hunger Vital Sign    Worried About Running Out of Food in the Last Year: Never true    Ran Out of Food in the Last Year: Never true  Transportation Needs: No Transportation Needs (01/08/2023)   PRAPARE - Administrator, Civil Service (Medical): No    Lack of Transportation (Non-Medical): No  Physical Activity: Not on file  Stress: Not on file  Social Connections: Not on file  Intimate Partner Violence: Not At Risk (01/08/2023)   Humiliation, Afraid, Rape, and Kick questionnaire    Fear of Current or Ex-Partner: No    Emotionally Abused: No    Physically Abused: No    Sexually Abused: No       PHYSICAL EXAM  Vitals:   01/16/23 1342 01/16/23 1349  BP: (!) 176/82 (!) 172/78  Pulse: 67 (!) 50  Weight: 106 lb 12.8 oz (48.4 kg)   Height: 4\' 11"  (1.499 m)     Body mass index is 21.57 kg/m.   General: The patient is well-developed and well-nourished and in no acute distress  HEENT:  Head is [place]/AT.  Sclera are anicteric.  Funduscopic exam shows normal optic discs and retinal vessels.  Neck: No carotid bruits are noted.  The neck is nontender.  Cardiovascular: The heart has a regular rate and rhythm with a normal S1 and S2. There were no murmurs,  gallops or rubs.    Skin: Extremities are without rash or  edema.  Musculoskeletal:  Back is nontender  Neurologic Exam  Mental status: The patient is alert and oriented x 3 at the time of the examination. The patient has apparent normal recent and remote memory, with an apparently normal attention span and concentration ability.   Speech is normal.  Cranial nerves: She had normal extraocular movements of the left eye.  On the right, she is only able to abduct about 15 degrees past midline.  Other movements were normal.  Pupils are equal, round, and reactive to light and accomodation.  Visual fields are full.  Facial symmetry is present. There is good facial sensation to soft touch bilaterally.Facial strength is normal.  Trapezius and sternocleidomastoid strength is normal. No dysarthria is noted.  The tongue is midline, and the patient has symmetric elevation of the soft palate. No obvious hearing deficits are noted.  Motor:  Muscle bulk is normal.   Tone is normal. Strength is  5 / 5 in all 4 extremities.   Sensory: Sensory testing is intact to pinprick, soft touch and vibration sensation in all 4 extremities.  Coordination: Cerebellar testing reveals good finger-nose-finger and heel-to-shin bilaterally.  Gait and station: Station is normal.   Gait is slightly wide but probably normal for age.  She is mildly stooped. Tandem gait is wide but normal for age. Romberg is negative.   Reflexes: Deep tendon reflexes are symmetric and normal bilaterally.   Plantar responses are flexor.    DIAGNOSTIC DATA (LABS, IMAGING, TESTING) - I reviewed patient records, labs, notes, testing and imaging myself where available.  Lab Results  Component Value Date   WBC 3.8 (L) 01/09/2023   HGB 7.7 (L) 01/09/2023   HCT 23.6 (L) 01/09/2023   MCV 87.1 01/09/2023   PLT 290 01/09/2023      Component Value Date/Time   NA 134 (L) 01/09/2023 0457   NA 140 02/15/2018 1435   K 4.1 01/09/2023 0457   CL 106  01/09/2023 0457   CO2 20 (L) 01/09/2023 0457   GLUCOSE 93  01/09/2023 0457   BUN 19 01/09/2023 0457   BUN 7 (L) 02/15/2018 1435   CREATININE 1.53 (H) 01/09/2023 0457   CALCIUM 8.4 (L) 01/09/2023 0457   PROT 8.6 (H) 01/07/2023 1930   ALBUMIN 3.9 01/07/2023 1930   AST 31 01/07/2023 1930   ALT 30 01/07/2023 1930   ALKPHOS 104 01/07/2023 1930   BILITOT 0.6 01/07/2023 1930   GFRNONAA 34 (L) 01/09/2023 0457   GFRAA 42 (L) 06/04/2020 1225   Lab Results  Component Value Date   CHOL 204 (H) 01/08/2023   HDL 84 01/08/2023   LDLCALC 112 (H) 01/08/2023   TRIG 41 01/08/2023   CHOLHDL 2.4 01/08/2023   Lab Results  Component Value Date   HGBA1C 4.7 (L) 01/08/2023   Lab Results  Component Value Date   VITAMINB12 1,086 (H) 09/07/2012   Lab Results  Component Value Date   TSH 0.541 02/24/2013       ASSESSMENT AND PLAN  Right abducens nerve palsy - Plan: Sedimentation rate, C-reactive protein  Diplopia - Plan: Sedimentation rate, C-reactive protein  Acute on chronic congestive heart failure, unspecified heart failure type (HCC)  Coronary artery disease involving native heart with angina pectoris, unspecified vessel or lesion type (HCC)  Essential hypertension   In summary, Ms. Keagle is an 82 year old woman with a history of CAD, CHF, essential hypertension who had the fairly sudden onset of diplopia consistent with a right 6th nerve palsy.  The weakness is partial and I discussed that I would expect her to start to show improvement within a few weeks and hopefully by 3 months vision will be back to baseline.  Sometimes it takes a little bit longer.  The most important thing is to reduce vascular risk factors and I went over the importance of taking her hypertensive medication as directed.  I recommended discussing the hypertension more with her primary care if pressures are elevated when she takes her medication as scheduled.  We will check ESR/CRP to rule out giant cell  arteritis.  She has an appointment to see ophthalmology next week.  We discussed that prism glasses may be of benefit.  I did not schedule of follow-up but she is advised to call us if she does not start to improve after a couple months or if she notes new neurologic symptoms.  Thank you for asked me to see Ms. Dresser.  Please let me know if I can be of further assistance with her or other patients in the future.   Tilford Deaton A. Epimenio Foot, MD, [facility] 01/16/2023, 2:45 PM Certified in Neurology, Clinical Neurophysiology, Sleep Medicine and Neuroimaging  [facility] [address] [place], [place] [address] [phone]

## 2023-01-17 LAB — SEDIMENTATION RATE: Sed Rate: 66 mm/hr — ABNORMAL HIGH (ref 0–40)

## 2023-01-17 LAB — C-REACTIVE PROTEIN: CRP: 2 mg/L (ref 0–10)

## 2023-01-18 ENCOUNTER — Telehealth: Payer: Self-pay | Admitting: *Deleted

## 2023-01-18 NOTE — Telephone Encounter (Signed)
-----   Message from Asa Lente, MD sent at 01/17/2023  7:07 PM EDT ----- Please let her know that the lab work is okay.  The 1 test was a little elevated (sed rate).  However, since the other test was normal the elevation is not clinically significant.

## 2023-01-18 NOTE — Telephone Encounter (Signed)
Called pt at [phone]. Relayed results per Dr. Bonnita Hollow note. She verbalized understanding. She will call back if she develops any new or worsening sx moving forward. She has ophthalmology appt 01/24/23.

## 2023-01-24 ENCOUNTER — Telehealth: Payer: Self-pay | Admitting: Rehabilitative and Restorative Service Providers"

## 2023-01-24 ENCOUNTER — Ambulatory Visit: Payer: Medicare HMO | Admitting: Rehabilitative and Restorative Service Providers"

## 2023-01-24 NOTE — Telephone Encounter (Signed)
She arrived for evaluation today stating only vision problems.  OT does not feel that this is appropriate for OT evaluation at this site and referred her onto the neurorehabilitation on [address] where there is an Information systems manager who specializes more so in visual issues.  She was not billed for any evaluation today but also was referred to the [facility] eye care center as there is a physician there who works with these type of issues on a regular basis.  She states understanding and thanks for the community referrals/ resources.

## 2023-05-07 ENCOUNTER — Other Ambulatory Visit (HOSPITAL_COMMUNITY): Payer: Self-pay | Admitting: Internal Medicine

## 2023-07-07 ENCOUNTER — Other Ambulatory Visit (HOSPITAL_COMMUNITY): Payer: Self-pay | Admitting: Internal Medicine

## 2023-07-23 ENCOUNTER — Other Ambulatory Visit (HOSPITAL_COMMUNITY): Payer: Self-pay | Admitting: Internal Medicine

## 2023-09-21 ENCOUNTER — Other Ambulatory Visit (HOSPITAL_COMMUNITY): Payer: Self-pay | Admitting: Internal Medicine

## 2023-11-27 ENCOUNTER — Other Ambulatory Visit (HOSPITAL_COMMUNITY): Payer: Self-pay

## 2023-12-13 ENCOUNTER — Observation Stay (HOSPITAL_COMMUNITY)
Admission: EM | Admit: 2023-12-13 | Discharge: 2023-12-15 | Disposition: A | Discharge status: Home / Self Care | Attending: Internal Medicine | Admitting: Internal Medicine

## 2023-12-13 ENCOUNTER — Other Ambulatory Visit (HOSPITAL_COMMUNITY): Payer: Self-pay

## 2023-12-13 ENCOUNTER — Emergency Department (HOSPITAL_COMMUNITY)

## 2023-12-13 ENCOUNTER — Other Ambulatory Visit: Payer: Self-pay

## 2023-12-13 ENCOUNTER — Telehealth (HOSPITAL_COMMUNITY): Payer: Self-pay | Admitting: Pharmacy Technician

## 2023-12-13 ENCOUNTER — Encounter (HOSPITAL_COMMUNITY): Payer: Self-pay

## 2023-12-13 DIAGNOSIS — E876 Hypokalemia: Secondary | ICD-10-CM | POA: Diagnosis not present

## 2023-12-13 DIAGNOSIS — I252 Old myocardial infarction: Secondary | ICD-10-CM

## 2023-12-13 DIAGNOSIS — E785 Hyperlipidemia, unspecified: Secondary | ICD-10-CM | POA: Diagnosis not present

## 2023-12-13 DIAGNOSIS — Z7984 Long term (current) use of oral hypoglycemic drugs: Secondary | ICD-10-CM | POA: Insufficient documentation

## 2023-12-13 DIAGNOSIS — R0602 Shortness of breath: Secondary | ICD-10-CM | POA: Diagnosis present

## 2023-12-13 DIAGNOSIS — I5043 Acute on chronic combined systolic (congestive) and diastolic (congestive) heart failure: Principal | ICD-10-CM | POA: Diagnosis present

## 2023-12-13 DIAGNOSIS — I2510 Atherosclerotic heart disease of native coronary artery without angina pectoris: Secondary | ICD-10-CM | POA: Diagnosis not present

## 2023-12-13 DIAGNOSIS — I10 Essential (primary) hypertension: Secondary | ICD-10-CM | POA: Diagnosis not present

## 2023-12-13 DIAGNOSIS — D472 Monoclonal gammopathy: Secondary | ICD-10-CM | POA: Diagnosis not present

## 2023-12-13 DIAGNOSIS — N1832 Chronic kidney disease, stage 3b: Secondary | ICD-10-CM | POA: Diagnosis present

## 2023-12-13 DIAGNOSIS — Z79899 Other long term (current) drug therapy: Secondary | ICD-10-CM | POA: Insufficient documentation

## 2023-12-13 DIAGNOSIS — Z8673 Personal history of transient ischemic attack (TIA), and cerebral infarction without residual deficits: Secondary | ICD-10-CM

## 2023-12-13 DIAGNOSIS — R059 Cough, unspecified: Secondary | ICD-10-CM | POA: Diagnosis not present

## 2023-12-13 DIAGNOSIS — Z7901 Long term (current) use of anticoagulants: Secondary | ICD-10-CM | POA: Insufficient documentation

## 2023-12-13 DIAGNOSIS — D62 Acute posthemorrhagic anemia: Secondary | ICD-10-CM | POA: Insufficient documentation

## 2023-12-13 DIAGNOSIS — D649 Anemia, unspecified: Secondary | ICD-10-CM | POA: Diagnosis not present

## 2023-12-13 DIAGNOSIS — I25119 Atherosclerotic heart disease of native coronary artery with unspecified angina pectoris: Secondary | ICD-10-CM

## 2023-12-13 DIAGNOSIS — J40 Bronchitis, not specified as acute or chronic: Secondary | ICD-10-CM | POA: Diagnosis not present

## 2023-12-13 DIAGNOSIS — Z87891 Personal history of nicotine dependence: Secondary | ICD-10-CM | POA: Diagnosis not present

## 2023-12-13 DIAGNOSIS — I130 Hypertensive heart and chronic kidney disease with heart failure and stage 1 through stage 4 chronic kidney disease, or unspecified chronic kidney disease: Secondary | ICD-10-CM | POA: Diagnosis not present

## 2023-12-13 DIAGNOSIS — I509 Heart failure, unspecified: Principal | ICD-10-CM

## 2023-12-13 LAB — BASIC METABOLIC PANEL WITH GFR
Anion gap: 10 (ref 5–15)
BUN: 24 mg/dL — ABNORMAL HIGH (ref 8–23)
CO2: 21 mmol/L — ABNORMAL LOW (ref 22–32)
Calcium: 8.7 mg/dL — ABNORMAL LOW (ref 8.9–10.3)
Chloride: 107 mmol/L (ref 98–111)
Creatinine, Ser: 1.55 mg/dL — ABNORMAL HIGH (ref 0.44–1.00)
GFR, Estimated: 33 mL/min — ABNORMAL LOW (ref >60)
Glucose, Bld: 121 mg/dL — ABNORMAL HIGH (ref 70–99)
Potassium: 3.5 mmol/L (ref 3.5–5.1)
Sodium: 138 mmol/L (ref 135–145)

## 2023-12-13 LAB — CBC
HCT: 22.3 % — ABNORMAL LOW (ref 36.0–46.0)
Hemoglobin: 7.3 g/dL — ABNORMAL LOW (ref 12.0–15.0)
MCH: 27.8 pg (ref 26.0–34.0)
MCHC: 32.7 g/dL (ref 30.0–36.0)
MCV: 84.8 fL (ref 80.0–100.0)
Platelets: 275 10*3/uL (ref 150–400)
RBC: 2.63 MIL/uL — ABNORMAL LOW (ref 3.87–5.11)
RDW: 13.3 % (ref 11.5–15.5)
WBC: 4.9 10*3/uL (ref 4.0–10.5)
nRBC: 0 % (ref 0.0–0.2)

## 2023-12-13 LAB — TROPONIN I (HIGH SENSITIVITY)
Troponin I (High Sensitivity): 44 ng/L — ABNORMAL HIGH (ref <18)
Troponin I (High Sensitivity): 60 ng/L — ABNORMAL HIGH (ref <18)

## 2023-12-13 LAB — IRON AND TIBC
Iron: 25 ug/dL — ABNORMAL LOW (ref 28–170)
Saturation Ratios: 10 % — ABNORMAL LOW (ref 10.4–31.8)
TIBC: 253 ug/dL (ref 250–450)
UIBC: 228 ug/dL

## 2023-12-13 LAB — BRAIN NATRIURETIC PEPTIDE: B Natriuretic Peptide: 3144.7 pg/mL — ABNORMAL HIGH (ref 0.0–100.0)

## 2023-12-13 LAB — FERRITIN: Ferritin: 75 ng/mL (ref 11–307)

## 2023-12-13 LAB — MAGNESIUM: Magnesium: 2 mg/dL (ref 1.7–2.4)

## 2023-12-13 MED ORDER — CARVEDILOL 25 MG PO TABS
25.0000 mg | ORAL_TABLET | Freq: Two times a day (BID) | ORAL | Status: DC
  Administered 2023-12-13 – 2023-12-15 (×4): 25 mg via ORAL
  Filled 2023-12-13 (×2): qty 1
  Filled 2023-12-13: qty 2
  Filled 2023-12-13: qty 1

## 2023-12-13 MED ORDER — ACETAMINOPHEN 325 MG PO TABS
650.0000 mg | ORAL_TABLET | Freq: Four times a day (QID) | ORAL | Status: DC | PRN
  Administered 2023-12-15: 650 mg via ORAL
  Filled 2023-12-13: qty 2

## 2023-12-13 MED ORDER — RIVAROXABAN 10 MG PO TABS
10.0000 mg | ORAL_TABLET | Freq: Every day | ORAL | Status: DC
Start: 2023-12-13 — End: 2023-12-13

## 2023-12-13 MED ORDER — SACUBITRIL-VALSARTAN 97-103 MG PO TABS
1.0000 | ORAL_TABLET | Freq: Two times a day (BID) | ORAL | Status: DC
  Administered 2023-12-13 – 2023-12-15 (×4): 1 via ORAL
  Filled 2023-12-13 (×5): qty 1

## 2023-12-13 MED ORDER — FUROSEMIDE 10 MG/ML IJ SOLN
60.0000 mg | Freq: Two times a day (BID) | INTRAMUSCULAR | Status: AC
  Administered 2023-12-14 (×2): 60 mg via INTRAVENOUS
  Filled 2023-12-13 (×2): qty 6

## 2023-12-13 MED ORDER — POLYETHYLENE GLYCOL 3350 17 G PO PACK: 17.0000 g | PACK | Freq: Every day | ORAL | Status: DC | PRN

## 2023-12-13 MED ORDER — SODIUM CHLORIDE 0.9% FLUSH
3.0000 mL | Freq: Two times a day (BID) | INTRAVENOUS | Status: DC
  Administered 2023-12-13 – 2023-12-15 (×5): 3 mL via INTRAVENOUS

## 2023-12-13 MED ORDER — ALBUTEROL SULFATE (2.5 MG/3ML) 0.083% IN NEBU
2.5000 mg | INHALATION_SOLUTION | Freq: Four times a day (QID) | RESPIRATORY_TRACT | Status: DC | PRN
  Administered 2023-12-13 – 2023-12-14 (×2): 2.5 mg via RESPIRATORY_TRACT
  Filled 2023-12-13 (×2): qty 3

## 2023-12-13 MED ORDER — APIXABAN 2.5 MG PO TABS
2.5000 mg | ORAL_TABLET | Freq: Two times a day (BID) | ORAL | Status: DC
  Administered 2023-12-13: 2.5 mg via ORAL
  Filled 2023-12-13: qty 1

## 2023-12-13 MED ORDER — ENSURE ENLIVE PO LIQD
237.0000 mL | Freq: Two times a day (BID) | ORAL | Status: DC
  Administered 2023-12-14 (×2): 237 mL via ORAL

## 2023-12-13 MED ORDER — EZETIMIBE 10 MG PO TABS
10.0000 mg | ORAL_TABLET | Freq: Every evening | ORAL | Status: DC
  Administered 2023-12-13 – 2023-12-14 (×2): 10 mg via ORAL
  Filled 2023-12-13 (×2): qty 1

## 2023-12-13 MED ORDER — FUROSEMIDE 10 MG/ML IJ SOLN
60.0000 mg | Freq: Once | INTRAMUSCULAR | Status: AC
Start: 2023-12-13 — End: 2023-12-13
  Administered 2023-12-13: 60 mg via INTRAVENOUS
  Filled 2023-12-13: qty 6

## 2023-12-13 MED ORDER — ATORVASTATIN CALCIUM 80 MG PO TABS
80.0000 mg | ORAL_TABLET | Freq: Every day | ORAL | Status: DC
  Administered 2023-12-13 – 2023-12-14 (×2): 80 mg via ORAL
  Filled 2023-12-13 (×2): qty 1

## 2023-12-13 MED ORDER — ISOSORBIDE MONONITRATE ER 30 MG PO TB24
30.0000 mg | ORAL_TABLET | Freq: Every evening | ORAL | Status: DC
Start: 2023-12-13 — End: 2023-12-15
  Administered 2023-12-13 – 2023-12-14 (×2): 30 mg via ORAL
  Filled 2023-12-13 (×2): qty 1

## 2023-12-13 MED ORDER — GUAIFENESIN ER 600 MG PO TB12
600.0000 mg | ORAL_TABLET | Freq: Two times a day (BID) | ORAL | Status: DC | PRN
  Administered 2023-12-13 – 2023-12-14 (×3): 600 mg via ORAL
  Filled 2023-12-13 (×3): qty 1

## 2023-12-13 MED ORDER — SPIRONOLACTONE 25 MG PO TABS
25.0000 mg | ORAL_TABLET | Freq: Every evening | ORAL | Status: DC
  Administered 2023-12-13 – 2023-12-14 (×2): 25 mg via ORAL
  Filled 2023-12-13 (×2): qty 1

## 2023-12-13 MED ORDER — ACETAMINOPHEN 650 MG RE SUPP: 650.0000 mg | Freq: Four times a day (QID) | RECTAL | Status: DC | PRN

## 2023-12-13 NOTE — ED Provider Notes (Signed)
 [place] EMERGENCY DEPARTMENT AT [facility] Provider Note   CSN: [id number] Arrival date & time: 12/13/23  1112     History  Chief Complaint  Patient presents with   Shortness of Breath   bronchitis   Chest Pain    Faith Gray is a 83 y.o. female.  This is a 83 year old female presenting emergency department for shortness of breath.  Reports symptoms seemingly started on Monday saw her primary doctor who stated that she had bronchitis and gave her steroids and ordered chest x-ray.  Chest x-ray outpatient reportedly showed fluid on lungs.  She reports being prescribed water pill, has not helped.  Reports slowly worsening of symptoms.  Reports history of CHF.  Reports some slight worsening lower extremity edema, can walk roughly quarter of the distance she normally can before tiring out.  Denies any chest pain.   Shortness of Breath Associated symptoms: chest pain   Chest Pain Associated symptoms: shortness of breath        Home Medications Prior to Admission medications   Medication Sig Start Date End Date Taking? Authorizing Provider  amLODipine (NORVASC) 10 MG tablet Take 1 tablet (10 mg total) by mouth daily. Patient taking differently: Take 10 mg by mouth every evening. 04/05/22  Yes Bensimhon, Bevelyn Buckles, MD  apixaban (ELIQUIS) 2.5 MG TABS tablet Take 1 tablet (2.5 mg total) by mouth 2 (two) times daily. 01/09/23   Danford, Earl Lites, MD  atorvastatin (LIPITOR) 80 MG tablet TAKE 1 TABLET BY MOUTH DAILY AT 6PM Patient taking differently: Take 80 mg by mouth daily. 05/19/19   Bensimhon, Bevelyn Buckles, MD  carvedilol (COREG) 25 MG tablet Take 25 mg by mouth 2 (two) times daily with a meal.    [provider]  ENTRESTO 97-103 MG TAKE 1 TABLET TWICE DAILY  (DOSE  CHANGE) Patient taking differently: Take 1 tablet by mouth 2 (two) times daily. 11/24/21   Bensimhon, Bevelyn Buckles, MD  ezetimibe (ZETIA) 10 MG tablet Take 1 tablet (10 mg total) by mouth daily. 01/09/23  01/09/24  Danford, Earl Lites, MD  hydrALAZINE (APRESOLINE) 100 MG tablet Take 1 tablet (100 mg total) by mouth 3 (three) times daily. NEEDS FOLLOW UP APPOINTMENT FOR MORE REFILLS 09/21/23   Bensimhon, Bevelyn Buckles, MD  isosorbide mononitrate (IMDUR) 30 MG 24 hr tablet Take 30 mg by mouth daily.    [provider]  JARDIANCE 10 MG TABS tablet TAKE 1 TABLET EVERY DAY Patient taking differently: Take 10 mg by mouth daily. 09/26/21   Bensimhon, Bevelyn Buckles, MD  pregabalin (LYRICA) 25 MG capsule Take 25 mg by mouth 2 (two) times daily.    [provider]  rivaroxaban (XARELTO) 10 MG TABS tablet Take 10 mg by mouth daily. 12/11/23   [provider]  spironolactone (ALDACTONE) 25 MG tablet TAKE 1 TABLET EVERY DAY (NEED MD APPOINTMENT) Patient taking differently: Take 25 mg by mouth daily. 07/23/23   Bensimhon, Bevelyn Buckles, MD  torsemide (DEMADEX) 5 MG tablet Take 1 tablet by mouth daily. 12/11/23 12/10/24  [provider]  TYLENOL 500 MG tablet Take 500-1,000 mg by mouth every 6 (six) hours as needed for mild pain or headache.    [provider]  furosemide (LASIX) 40 MG tablet Take 1 tablet (40 mg total) by mouth as needed. 02/05/18 04/13/20  Bensimhon, Bevelyn Buckles, MD      Allergies    Vicodin [hydrocodone-acetaminophen]    Review of Systems   Review of Systems  Respiratory:  Positive for shortness of breath.   Cardiovascular:  Positive for chest pain.    Physical Exam Updated Vital Signs BP (!) 154/101   Pulse (!) 107   Temp 98.4 F (36.9 C)   Resp (!) 25   Ht 4\' 11"  (1.499 m)   Wt 48.1 kg   SpO2 98%   BMI 21.41 kg/m  Physical Exam Vitals and nursing note reviewed.  Constitutional:      General: She is not in acute distress.    Appearance: She is not toxic-appearing.  HENT:     Head: Normocephalic.  Cardiovascular:     Rate and Rhythm: Normal rate and regular rhythm.  Pulmonary:     Effort: Pulmonary effort is normal.     Comments: Coarse breath  sounds/crackles in bases.  Speaking in full sentences.  Oxygen saturation 93% on room air. Musculoskeletal:     Cervical back: Normal range of motion.     Right lower leg: Edema present.     Left lower leg: Edema present.  Skin:    General: Skin is warm.     Capillary Refill: Capillary refill takes less than 2 seconds.  Neurological:     Mental Status: She is alert and oriented to person, place, and time.  Psychiatric:        Mood and Affect: Mood normal.        Behavior: Behavior normal.     ED Results / Procedures / Treatments   Labs (all labs ordered are listed, but only abnormal results are displayed) Labs Reviewed  BASIC METABOLIC PANEL WITH GFR - Abnormal; Notable for the following components:      Result Value   CO2 21 (*)    Glucose, Bld 121 (*)    BUN 24 (*)    Creatinine, Ser 1.55 (*)    Calcium 8.7 (*)    GFR, Estimated 33 (*)    All other components within normal limits  CBC - Abnormal; Notable for the following components:   RBC 2.63 (*)    Hemoglobin 7.3 (*)    HCT 22.3 (*)    All other components within normal limits  BRAIN NATRIURETIC PEPTIDE - Abnormal; Notable for the following components:   B Natriuretic Peptide 3,144.7 (*)    All other components within normal limits  TROPONIN I (HIGH SENSITIVITY) - Abnormal; Notable for the following components:   Troponin I (High Sensitivity) 44 (*)    All other components within normal limits  TROPONIN I (HIGH SENSITIVITY)    EKG EKG Interpretation Date/Time:  Thursday December 13 2023 11:54:11 EDT Ventricular Rate:  102 PR Interval:  132 QRS Duration:  100 QT Interval:  352 QTC Calculation: 458 R Axis:   55  Text Interpretation: Sinus tachycardia Minimal voltage criteria for LVH, may be normal variant ( Sokolow-Lyon ) Possible Inferior infarct , age undetermined Abnormal ECG When compared with ECG of 07-Jan-2023 19:42, PREVIOUS ECG IS PRESENT Confirmed by Estanislado Pandy [phone]) on 12/13/2023 1:30:56  PM  Radiology DG Chest 2 View Result Date: 12/13/2023 CLINICAL DATA:  Shortness of breath. EXAM: CHEST - 2 VIEW COMPARISON:  Chest radiograph dated 01/08/2023. FINDINGS: The heart size and mediastinal contours are within normal limits. Aortic atherosclerosis. Diffuse bilateral interstitial coarsening. Streaky opacities at the right lung base. No pleural effusion or pneumothorax. Diffusely decreased osseous mineralization. Partially visualized levoscoliosis of the lumbar spine. No acute osseous abnormality. IMPRESSION: Bilateral interstitial coarsening with streaky right basilar opacities, which could reflect asymmetric pulmonary edema versus  infiltrate. Electronically Signed   By: Hart Robinsons M.D.   On: 12/13/2023 12:44    Procedures Procedures    Medications Ordered in ED Medications  furosemide (LASIX) injection 60 mg (has no administration in time range)    ED Course/ Medical Decision Making/ A&P Clinical Course as of 12/13/23 1440  Thu Dec 13, 2023  1331 Echo 01/09/23 per my chart review: "1. There is no left ventricular thrombus (Definity contrast was used).  Left ventricular ejection fraction, by estimation, is 35 to 40%. The left  ventricle has moderately decreased function. The left ventricle  demonstrates regional wall motion  " [TY]  1438 DG Chest 2 View IMPRESSION: Bilateral interstitial coarsening with streaky right basilar opacities, which could reflect asymmetric pulmonary edema versus infiltrate.   [TY]    Clinical Course User Index [TY] Coral Spikes, DO                                 Medical Decision Making This is an 83 year old female presenting emergency department for shortness of breath in the setting of CHF.  She is afebrile, mildly elevated heart rate, is slightly hypertensive.  On exam does appear to be somewhat fluid overloaded with lower extremity edema, crackles/coarse breath sounds in lower lungs.  History concerning for decompensated heart  failure.  Per review does not appear that she takes water pills regularly, but was recently prescribed from primary doctor.  She has taken them several days with little improvement.  She notably has a EF of roughly 35 to 45% from echo in 2024.  Chest x-ray today with concern fo asymmetric pulmonary edema versus infiltrate.  History more consistent with CHF rather than pneumonia.  She has no leukocytosis, fever, not having cough.  Also, outpatient chest x-ray also with edema and pleural effusions.  Treated for CHF with IV Lasix 60 mg.  Troponin with mild elevation, EKG without ST segment changes to indicate ischemia on my independent rotation.  Her BNP is significantly elevated which would again point towards a CHF/fluid overload picture.  She has no significant metabolic derangements.  Appears to be at baseline kidney function.  Of note she is anemic with a hemoglobin of 7.3.  This is seemingly slightly downtrending, no overt sources of bleeding per her report.  This could be contributing to her shortness of breath.  Given patient's age and failure of outpatient diuresis will admit for further diuresis.  Amount and/or Complexity of Data Reviewed Independent Historian:     Details: Family notes that patient gets winded substantially more within the past week Labs: ordered. Radiology: ordered. Decision-making details documented in ED Course. Discussion of management or test interpretation with external provider(s): Discussed with admitting team.  Risk Prescription drug management. Decision regarding hospitalization.         Final Clinical Impression(s) / ED Diagnoses Final diagnoses:  Acute on chronic congestive heart failure, unspecified heart failure type [facility])    Rx / DC Orders ED Discharge Orders     None         Coral Spikes, DO 12/13/23 1440

## 2023-12-13 NOTE — ED Notes (Signed)
No one in room.

## 2023-12-13 NOTE — Telephone Encounter (Signed)
 Patient Product/process development scientist completed.    The patient is insured through [employer]. Patient has Medicare and is not eligible for a copay card, but may be able to apply for patient assistance or Medicare RX Payment Plan (Patient Must reach out to their plan, if eligible for payment plan), if available.    Ran test claim for Eliquis 5 mg and the current 30 day co-pay is $47.00.  Ran test claim for Xarelto 20 mg and the current 30 day co-pay is $47.00.  This test claim was processed through [facility]- copay amounts may vary at other pharmacies due to pharmacy/plan contracts, or as the patient moves through the different stages of their insurance plan.     Roland Earl, CPHT Pharmacy Technician III Certified Patient Advocate [facility] Patient Advocate Team Direct Number: [phone]  Fax: [phone]

## 2023-12-13 NOTE — Progress Notes (Signed)
 Heart Failure Navigator Progress Note  Assessed for Heart & Vascular TOC clinic readiness.  Patient does not meet criteria due to Advanced Heart Failure Team patient of Dr. Gala Romney. .   Navigator will sign off at this time.   Rhae Hammock, BSN, Scientist, clinical (histocompatibility and immunogenetics) Only

## 2023-12-13 NOTE — ED Provider Triage Note (Signed)
 Emergency Medicine Provider Triage Evaluation Note  Faith Gray , a 83 y.o. female  was evaluated in triage.  Pt complains of shortness of breath for the last several days, worse today.  History of CHF, x-ray with concern for possible fluid on the lungs.  She denies any chest pain but endorses chest tightness..  Review of Systems  Positive: Shortness of breath, congestion Negative: Chest pain  Physical Exam  BP (!) 149/92 (BP Location: Left Arm)   Pulse (!) 103   Temp 98 F (36.7 C)   Resp 16   Ht 4\' 11"  (1.499 m)   Wt 48.1 kg   SpO2 93%   BMI 21.41 kg/m  Gen:   Awake, no distress   Resp:  Normal effort  MSK:   Moves extremities without difficulty  Other:  4/6 systolic murmur noted congestion throughout lung fields some rales, rhonchi  Medical Decision Making  Medically screening exam initiated at 12:21 PM.  Appropriate orders placed.  Faith Gray was informed that the remainder of the evaluation will be completed by another provider, this initial triage assessment does not replace that evaluation, and the importance of remaining in the ED until their evaluation is complete.  Workup initiated in triage    Olene Floss, [place] 12/13/23 1222

## 2023-12-13 NOTE — H&P (Signed)
 History and Physical   Faith Gray ZOX:[id number] DOB: August 18, 1941 DOA: 12/13/2023  PCP: [name], PA-C   Patient coming from: Home  Chief Complaint: Shortness of breath  HPI: Faith Gray is a 83 y.o. female with medical history significant of hypertension, hyperlipidemia, CKD 3B, CAD, TIA, anemia, chronic combined systolic and diastolic CHF, LV aneurysm on anticoagulation presenting with shortness of breath.  Patient has had 3 days of shortness of breath.  She went and saw her PCP who initially suspected bronchitis and prescribed steroids.  Chest x-ray was checked which showed edema.  Patient was prescribed torsemide without improvement and presented to the ED for further evaluation.  She does note some worsened lower extremity edema and increased dyspnea on exertion.  Denies fevers, chills, chest pain, abdominal pain, constipation, diarrhea, nausea, vomiting.  ED Course: Signs in the ED notable for heart rate in the 100s, blood pressure in the 140s to 150s systolic, respiratory rate in the teens to 20s, saturating appropriately on room air at rest.  Lab workup showed BMP with bicarb 1, BUN 24, creatinine stable at 1.55.  Troponin 44, repeat pending.  BNP 3144.  Checks x-ray of bilateral changes consistent with edema versus trach.  Patient received 60 mg IV Lasix in the ED.  Review of Systems: As per HPI otherwise all other systems reviewed and are negative.  Past Medical History:  Diagnosis Date   Acute systolic heart failure (HCC) 09/09/2012   Anemia    Arthritis    CAD (coronary artery disease) 09/11/2012   Chest pain 09/07/2012   CHF (congestive heart failure) (HCC)    Dental caries    Essential hypertension 10/04/2012   Ischemic cardiomyopathy    NSTEMI (non-ST elevated myocardial infarction) (HCC) 09/11/2012   Shortness of breath     Past Surgical History:  Procedure Laterality Date   CARDIAC CATHETERIZATION     LEFT AND RIGHT HEART CATHETERIZATION WITH CORONARY  ANGIOGRAM N/A 09/10/2012   Procedure: LEFT AND RIGHT HEART CATHETERIZATION WITH CORONARY ANGIOGRAM;  Surgeon: Dolores Patty, MD;  Location: [facility] CATH LAB;  Service: Cardiovascular;  Laterality: N/A;   MULTIPLE EXTRACTIONS WITH ALVEOLOPLASTY N/A 02/16/2017   Procedure: MULTIPLE EXTRACTION WITH ALVEOLOPLASTY;  Surgeon: [name], DDS;  Location: [facility] OR;  Service: Oral Surgery;  Laterality: N/A;    Social History  reports that she has quit smoking. Her smoking use included cigarettes. She has a 10 pack-year smoking history. She has never used smokeless tobacco. She reports that she does not drink alcohol and does not use drugs.  Allergies  Allergen Reactions   Vicodin [Hydrocodone-Acetaminophen] Nausea And Vomiting    Family History  Problem Relation Age of Onset   Sickle cell anemia Brother    Prior to Admission medications   Medication Sig Start Date End Date Taking? Authorizing Provider  amLODipine (NORVASC) 10 MG tablet Take 1 tablet (10 mg total) by mouth daily. Patient taking differently: Take 10 mg by mouth every evening. 04/05/22  Yes Bensimhon, Bevelyn Buckles, MD  atorvastatin (LIPITOR) 80 MG tablet TAKE 1 TABLET BY MOUTH DAILY AT 6PM Patient taking differently: Take 80 mg by mouth daily. 05/19/19  Yes Bensimhon, Bevelyn Buckles, MD  carvedilol (COREG) 25 MG tablet Take 25 mg by mouth 2 (two) times daily with a meal.   Yes [provider]  ENTRESTO 97-103 MG TAKE 1 TABLET TWICE DAILY  (DOSE  CHANGE) Patient taking differently: Take 1 tablet by mouth 2 (two) times daily. 11/24/21  Yes Bensimhon, Reuel Boom  R, MD  ezetimibe (ZETIA) 10 MG tablet Take 1 tablet (10 mg total) by mouth daily. Patient taking differently: Take 10 mg by mouth every evening. 01/09/23 01/09/24 Yes Danford, Earl Lites, MD  ferrous sulfate 325 (65 FE) MG EC tablet Take 325 mg by mouth daily after breakfast.   Yes [provider]  hydrALAZINE (APRESOLINE) 100 MG tablet Take 1 tablet (100 mg total) by mouth 3  (three) times daily. NEEDS FOLLOW UP APPOINTMENT FOR MORE REFILLS Patient taking differently: Take 100 mg by mouth 3 (three) times daily after meals. NEEDS FOLLOW UP APPOINTMENT FOR MORE REFILLS 09/21/23  Yes Bensimhon, Bevelyn Buckles, MD  isosorbide mononitrate (IMDUR) 30 MG 24 hr tablet Take 30 mg by mouth every evening.   Yes [provider]  JARDIANCE 10 MG TABS tablet TAKE 1 TABLET EVERY DAY Patient taking differently: Take 10 mg by mouth as needed. 09/26/21  Yes Bensimhon, Bevelyn Buckles, MD  pregabalin (LYRICA) 25 MG capsule Take 25 mg by mouth as needed (nerve pain).   Yes [provider]  spironolactone (ALDACTONE) 25 MG tablet TAKE 1 TABLET EVERY DAY (NEED MD APPOINTMENT) Patient taking differently: Take 25 mg by mouth every evening. 07/23/23  Yes Bensimhon, Bevelyn Buckles, MD  torsemide [facility]) 5 MG tablet Take 1 tablet by mouth daily after breakfast. 12/11/23 12/10/24 Yes [provider]  TYLENOL 500 MG tablet Take 500-1,000 mg by mouth every 6 (six) hours as needed for mild pain or headache.   Yes [provider]  rivaroxaban (XARELTO) 10 MG TABS tablet Take 10 mg by mouth daily. 12/11/23   [provider]  furosemide (LASIX) 40 MG tablet Take 1 tablet (40 mg total) by mouth as needed. 02/05/18 04/13/20  Bensimhon, Bevelyn Buckles, MD    Physical Exam: Vitals:   12/13/23 1119 12/13/23 1128 12/13/23 1324  BP: (!) 149/92  (!) 154/101  Pulse: (!) 103  (!) 107  Resp: 16  (!) 25  Temp: 98 F (36.7 C)  98.4 F (36.9 C)  SpO2: 93%  98%  Weight:  48.1 kg   Height:  4\' 11"  (1.499 m)    Physical Exam Constitutional:      General: She is not in acute distress.    Appearance: Normal appearance.  HENT:     Head: Normocephalic and atraumatic.     Mouth/Throat:     Mouth: Mucous membranes are moist.     Pharynx: Oropharynx is clear.  Eyes:     Extraocular Movements: Extraocular movements intact.     Pupils: Pupils are equal, round, and reactive to light.   Cardiovascular:     Rate and Rhythm: Normal rate and regular rhythm.     Pulses: Normal pulses.     Heart sounds: Normal heart sounds.  Pulmonary:     Effort: Pulmonary effort is normal. No respiratory distress.     Breath sounds: Wheezing present.  Abdominal:     General: Bowel sounds are normal. There is no distension.     Palpations: Abdomen is soft.     Tenderness: There is no abdominal tenderness.  Musculoskeletal:        General: No swelling or deformity.     Right lower leg: Edema (trace) present.     Left lower leg: No edema.  Skin:    General: Skin is warm and dry.  Neurological:     General: No focal deficit present.     Mental Status: Mental status is at baseline.  Labs on Admission: I have personally reviewed following labs and imaging studies  CBC: Recent Labs  Lab 12/13/23 1129  WBC 4.9  HGB 7.3*  HCT 22.3*  MCV 84.8  PLT 275    Basic Metabolic Panel: Recent Labs  Lab 12/13/23 1129  NA 138  K 3.5  CL 107  CO2 21*  GLUCOSE 121*  BUN 24*  CREATININE 1.55*  CALCIUM 8.7*    GFR: Estimated Creatinine Clearance: 19.4 mL/min (A) (by C-G formula based on SCr of 1.55 mg/dL (H)).  Liver Function Tests: No results for input(s): "AST", "ALT", "ALKPHOS", "BILITOT", "PROT", "ALBUMIN" in the last 168 hours.  Urine analysis:    Component Value Date/Time   COLORURINE STRAW (A) 01/07/2023 2113   APPEARANCEUR CLEAR 01/07/2023 2113   LABSPEC 1.006 01/07/2023 2113   PHURINE 6.0 01/07/2023 2113   GLUCOSEU NEGATIVE 01/07/2023 2113   HGBUR SMALL (A) 01/07/2023 2113   BILIRUBINUR NEGATIVE 01/07/2023 2113   KETONESUR NEGATIVE 01/07/2023 2113   PROTEINUR NEGATIVE 01/07/2023 2113   NITRITE NEGATIVE 01/07/2023 2113   LEUKOCYTESUR NEGATIVE 01/07/2023 2113    Radiological Exams on Admission: DG Chest 2 View Result Date: 12/13/2023 CLINICAL DATA:  Shortness of breath. EXAM: CHEST - 2 VIEW COMPARISON:  Chest radiograph dated 01/08/2023. FINDINGS: The heart  size and mediastinal contours are within normal limits. Aortic atherosclerosis. Diffuse bilateral interstitial coarsening. Streaky opacities at the right lung base. No pleural effusion or pneumothorax. Diffusely decreased osseous mineralization. Partially visualized levoscoliosis of the lumbar spine. No acute osseous abnormality. IMPRESSION: Bilateral interstitial coarsening with streaky right basilar opacities, which could reflect asymmetric pulmonary edema versus infiltrate. Electronically Signed   By: Hart Robinsons M.D.   On: 12/13/2023 12:44   EKG: Independently reviewed.  Sinus tachycardia 102 bpm.  Nonspecific T wave changes.  Baseline wander in V3.  Borderline LVH.  Assessment/Plan Principal Problem:   Acute on chronic combined systolic (congestive) and diastolic (congestive) heart failure (HCC) Active Problems:   Essential hypertension   Chronic kidney disease, stage 3b (HCC)   Normochromic anemia   CAD (coronary artery disease)   Hyperlipidemia   History of TIA (transient ischemic attack)   History of non-ST elevation myocardial infarction (NSTEMI)   Acute on chronic combined systolic and diastolic CHF > Patient presenting with worsening shortness of breath last several days.  Noted to have edema on chest x-ray outpatient started on p.o. diuretic without improvement. > Currently stable on room air but has significant dyspnea on exertion and worsening lower extremity edema. > Troponin initially mildly elevated to 44 with repeat pending.  BNP significantly elevated to 3144.  Chest x-ray with bilateral changes consistent with edema but less likely infiltrate. > Last echo was a year ago with EF 35-40%, G1 DD, normal RV function.  History of LV aneurysm on anticoagulation, being transitioned from Eliquis to Xarelto. - Monitor on telemetry overnight - Continue with Lasix 60 mg IV twice daily - Strict I's and O's, daily weights - Echocardiogram - Check magnesium - Trend renal function  and electrolytes - Continue home Coreg, Entresto, Imdur, spironolactone  History of LV aneurysm > Transitioning from Eliquis to Xarelto but has not picked it up. This was reportedly due to cost, but pharmacist here discussed it with patient and ran her insurance and the cost was the same.  Patient is okay with remaining on Eliquis in this instance. - Continue home Eliquis  Anemia > Hemoglobin has been drifting down recently.  Currently 7.3 which is down from  recent baseline of 8-9.  Possibly delusional component but MCV is low normal so we will check iron studies. > No overt bleeding, no dark or bloody stools. - Trend CBC - Check iron, ferritin  Cough Wheeze > Unclear if concurrent respiratory illness or cardiac wheeze. - As needed albuterol and as needed Mucinex  Hypertension - Continue home Coreg, Entresto, Imdur, spironolactone - Lasix as above - Holding hydralazine and amlodipine with initiation of Lasix  Hyperlipidemia - Continue home Zetia  History of TIA - Continue home Zetia, Eliquis   CAD - Continue home Zetia, Eliquis - Continue home Coreg, Entresto, Imdur  CKD 3B > Creatinine currently stable in the ED. - Trend renal function and electrolytes  DVT prophylaxis: Eliquis Code Status:   Full Family Communication:  Updated at bedside (grandson)  Disposition Plan:   Patient is from:  Home  Anticipated DC to:  Home  Anticipated DC date:  1 to 5 days  Anticipated DC barriers: None  Consults called:  None Admission status:  Observation, telemetry  Severity of Illness: The appropriate patient status for this patient is OBSERVATION. Observation status is judged to be reasonable and necessary in order to provide the required intensity of service to ensure the patient's safety. The patient's presenting symptoms, physical exam findings, and initial radiographic and laboratory data in the context of their medical condition is felt to place them at decreased risk for  further clinical deterioration. Furthermore, it is anticipated that the patient will be medically stable for discharge from the hospital within 2 midnights of admission.    Synetta Fail MD [facility]  How to contact the [facility] Attending or Consulting provider 7A - 7P or covering provider during after hours 7P -7A, for this patient?   Check the care team in [facility] and look for a) attending/consulting [facility] provider listed and b) the [facility] team listed Log into [email or web address] and use [place]'s universal password to access. If you do not have the password, please contact the hospital operator. Locate the [facility] provider you are looking for under [facility] and page to a number that you can be directly reached. If you still have difficulty reaching the provider, please page the [facility] (Director on Call) for the Hospitalists listed on amion for assistance.  12/13/2023, 3:10 PM

## 2023-12-13 NOTE — ED Triage Notes (Signed)
 Pt. Stated, I went to see Dr and he said I had bronchitis , but I'm SOB and I have CHF

## 2023-12-14 ENCOUNTER — Observation Stay (HOSPITAL_COMMUNITY)

## 2023-12-14 DIAGNOSIS — I509 Heart failure, unspecified: Secondary | ICD-10-CM | POA: Diagnosis not present

## 2023-12-14 DIAGNOSIS — D649 Anemia, unspecified: Secondary | ICD-10-CM | POA: Diagnosis not present

## 2023-12-14 DIAGNOSIS — I5023 Acute on chronic systolic (congestive) heart failure: Secondary | ICD-10-CM

## 2023-12-14 DIAGNOSIS — I340 Nonrheumatic mitral (valve) insufficiency: Secondary | ICD-10-CM

## 2023-12-14 DIAGNOSIS — I253 Aneurysm of heart: Secondary | ICD-10-CM | POA: Diagnosis not present

## 2023-12-14 DIAGNOSIS — I5021 Acute systolic (congestive) heart failure: Secondary | ICD-10-CM | POA: Diagnosis not present

## 2023-12-14 DIAGNOSIS — I5043 Acute on chronic combined systolic (congestive) and diastolic (congestive) heart failure: Secondary | ICD-10-CM | POA: Diagnosis not present

## 2023-12-14 DIAGNOSIS — N1831 Chronic kidney disease, stage 3a: Secondary | ICD-10-CM

## 2023-12-14 DIAGNOSIS — I2510 Atherosclerotic heart disease of native coronary artery without angina pectoris: Secondary | ICD-10-CM | POA: Diagnosis not present

## 2023-12-14 DIAGNOSIS — I10 Essential (primary) hypertension: Secondary | ICD-10-CM | POA: Diagnosis not present

## 2023-12-14 LAB — CBC
HCT: 20 % — ABNORMAL LOW (ref 36.0–46.0)
Hemoglobin: 6.7 g/dL — CL (ref 12.0–15.0)
MCH: 27.9 pg (ref 26.0–34.0)
MCHC: 33.5 g/dL (ref 30.0–36.0)
MCV: 83.3 fL (ref 80.0–100.0)
Platelets: 264 10*3/uL (ref 150–400)
RBC: 2.4 MIL/uL — ABNORMAL LOW (ref 3.87–5.11)
RDW: 12.7 % (ref 11.5–15.5)
WBC: 4.7 10*3/uL (ref 4.0–10.5)
nRBC: 0 % (ref 0.0–0.2)

## 2023-12-14 LAB — ECHOCARDIOGRAM COMPLETE
AR max vel: 1 cm2
AV Area VTI: 1.12 cm2
AV Area mean vel: 0.91 cm2
AV Mean grad: 6 mmHg
AV Peak grad: 12 mmHg
Ao pk vel: 1.73 m/s
Area-P 1/2: 2.92 cm2
Calc EF: 42.7 %
Height: 59 in
MV VTI: 1.12 cm2
S' Lateral: 4.6 cm
Single Plane A2C EF: 44.3 %
Single Plane A4C EF: 44.7 %
Weight: 1608 [oz_av]

## 2023-12-14 LAB — COMPREHENSIVE METABOLIC PANEL WITH GFR
ALT: 28 U/L (ref 0–44)
AST: 25 U/L (ref 15–41)
Albumin: 3 g/dL — ABNORMAL LOW (ref 3.5–5.0)
Alkaline Phosphatase: 86 U/L (ref 38–126)
Anion gap: 9 (ref 5–15)
BUN: 25 mg/dL — ABNORMAL HIGH (ref 8–23)
CO2: 27 mmol/L (ref 22–32)
Calcium: 8.7 mg/dL — ABNORMAL LOW (ref 8.9–10.3)
Chloride: 104 mmol/L (ref 98–111)
Creatinine, Ser: 1.56 mg/dL — ABNORMAL HIGH (ref 0.44–1.00)
GFR, Estimated: 33 mL/min — ABNORMAL LOW (ref >60)
Glucose, Bld: 122 mg/dL — ABNORMAL HIGH (ref 70–99)
Potassium: 3.4 mmol/L — ABNORMAL LOW (ref 3.5–5.1)
Sodium: 140 mmol/L (ref 135–145)
Total Bilirubin: 0.5 mg/dL (ref 0.0–1.2)
Total Protein: 6.7 g/dL (ref 6.5–8.1)

## 2023-12-14 LAB — ABO/RH: ABO/RH(D): B NEG

## 2023-12-14 LAB — FOLATE: Folate: 16.5 ng/mL (ref >5.9)

## 2023-12-14 LAB — TROPONIN I (HIGH SENSITIVITY)
Troponin I (High Sensitivity): 22 ng/L — ABNORMAL HIGH (ref <18)
Troponin I (High Sensitivity): 20 ng/L — ABNORMAL HIGH (ref <18)

## 2023-12-14 LAB — PREPARE RBC (CROSSMATCH)

## 2023-12-14 LAB — VITAMIN B12: Vitamin B-12: 1759 pg/mL — ABNORMAL HIGH (ref 180–914)

## 2023-12-14 MED ORDER — TORSEMIDE 20 MG PO TABS
20.0000 mg | ORAL_TABLET | Freq: Every day | ORAL | Status: DC
  Administered 2023-12-15: 20 mg via ORAL
  Filled 2023-12-14: qty 1

## 2023-12-14 MED ORDER — POTASSIUM CHLORIDE CRYS ER 20 MEQ PO TBCR
40.0000 meq | EXTENDED_RELEASE_TABLET | Freq: Once | ORAL | Status: AC
  Administered 2023-12-14: 40 meq via ORAL
  Filled 2023-12-14: qty 2

## 2023-12-14 MED ORDER — SODIUM CHLORIDE 0.9% IV SOLUTION: Freq: Once | INTRAVENOUS | Status: AC

## 2023-12-14 MED ORDER — PERFLUTREN LIPID MICROSPHERE
1.0000 mL | INTRAVENOUS | Status: AC | PRN
  Administered 2023-12-14: 3 mL via INTRAVENOUS

## 2023-12-14 MED ORDER — IRON SUCROSE 300 MG IVPB - SIMPLE MED
300.0000 mg | Status: DC
  Administered 2023-12-14: 300 mg via INTRAVENOUS
  Filled 2023-12-14: qty 265
  Filled 2023-12-14: qty 300

## 2023-12-14 NOTE — Consult Note (Addendum)
 Advanced Heart Failure Team Consult Note  Primary Physician: [name], PA-C Cardiologist:  None  Reason for Consultation: Acute on chronic heart failure HPI:    Faith Gray is seen today for evaluation of acute on chronic heart failure at the request of Dr. Isidoro Donning.   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     Echo in 2013 showed EF 25-30% with diffuse hypokinesis. Mild MR.  Echo 4/24 showed ED 35-40%, with RWMA, G1DD, nl RV, mild MR.    Previously followed with Dr. Gala Romney, however lost to follow up since 03/2022. Although she reports that she has remained stable from a HF symptom standpoint.   Presented to [facility] ED with shortness of breath and BLE edema. On arrival she was hypertensive 140-150/90s with ST in the low 100s. O2sat 94% on RA. Labs notable for K3.5, CO2 21, BUN/Cr 24/1.55, BNP 3144, hs-trop 44>60>20, HGB 7.3, wbc 4.9, and Tsat 10. CXR with pulm edema. EKG with ST in 102 bpm. She was started on diuresis with Lasix 60 mg IV with good response. Echo ordered and cardiology consulted.  Echo today ~40% on Dr Taquila Leys's read with moderate to severe MR.   Sitting up in bed this morning with family at bedside. Feeling better this morning, minimal SOB. Had been doing well with volume and symptoms for a while. He son does note that she may have recently had bronchitis, however she says that isn't what it was.   Home Medications Prior to Admission medications   Medication Sig Start Date End Date Taking? Authorizing Provider  amLODipine (NORVASC) 10 MG tablet Take 1 tablet (10 mg total) by mouth daily. Patient taking differently: Take 10 mg by mouth every evening. 04/05/22  Yes Emil Klassen, Bevelyn Buckles, MD  atorvastatin (LIPITOR) 80 MG tablet TAKE 1 TABLET BY MOUTH DAILY AT 6PM Patient taking differently: Take 80 mg by mouth  daily. 05/19/19  Yes Porschia Willbanks, Bevelyn Buckles, MD  carvedilol (COREG) 25 MG tablet Take 25 mg by mouth 2 (two) times daily with a meal.   Yes [provider]  ENTRESTO 97-103 MG TAKE 1 TABLET TWICE DAILY  (DOSE  CHANGE) Patient taking differently: Take 1 tablet by mouth 2 (two) times daily. 11/24/21  Yes Manolito Jurewicz, Bevelyn Buckles, MD  ezetimibe (ZETIA) 10 MG tablet Take 1 tablet (10 mg total) by mouth daily. Patient taking differently: Take 10 mg by mouth every evening. 01/09/23 01/09/24 Yes Danford, Earl Lites, MD  ferrous sulfate 325 (65 FE) MG EC tablet Take 325 mg by mouth daily after breakfast.   Yes [provider]  hydrALAZINE (APRESOLINE) 100 MG tablet Take 1 tablet (100 mg total) by mouth 3 (three) times daily. NEEDS FOLLOW UP APPOINTMENT FOR MORE REFILLS Patient taking differently: Take 100 mg by mouth 3 (three) times daily after meals. NEEDS FOLLOW UP APPOINTMENT FOR MORE REFILLS 09/21/23  Yes Canio Winokur, Bevelyn Buckles, MD  isosorbide mononitrate (IMDUR) 30 MG 24 hr tablet Take 30 mg by mouth every evening.   Yes [provider]  JARDIANCE 10 MG TABS tablet TAKE 1 TABLET EVERY DAY Patient taking differently: Take 10 mg by mouth as needed. 09/26/21  Yes Therin Vetsch, Bevelyn Buckles, MD  pregabalin (LYRICA) 25 MG capsule Take 25 mg by mouth as needed (nerve pain).   Yes [provider]  spironolactone (ALDACTONE) 25 MG tablet  TAKE 1 TABLET EVERY DAY (NEED MD APPOINTMENT) Patient taking differently: Take 25 mg by mouth every evening. 07/23/23  Yes Reham Slabaugh, Bevelyn Buckles, MD  torsemide [facility]) 5 MG tablet Take 1 tablet by mouth daily after breakfast. 12/11/23 12/10/24 Yes [provider]  TYLENOL 500 MG tablet Take 500-1,000 mg by mouth every 6 (six) hours as needed for mild pain or headache.   Yes [provider]  rivaroxaban (XARELTO) 10 MG TABS tablet Take 10 mg by mouth daily. 12/11/23   [provider]  furosemide (LASIX) 40 MG tablet Take 1 tablet (40 mg  total) by mouth as needed. 02/05/18 04/13/20  Rimsha Trembley, Bevelyn Buckles, MD   Past Medical History: Past Medical History:  Diagnosis Date   Acute systolic heart failure (HCC) 09/09/2012   Anemia    Arthritis    CAD (coronary artery disease) 09/11/2012   Chest pain 09/07/2012   CHF (congestive heart failure) (HCC)    Dental caries    Essential hypertension 10/04/2012   Ischemic cardiomyopathy    NSTEMI (non-ST elevated myocardial infarction) (HCC) 09/11/2012   Shortness of breath    Past Surgical History: Past Surgical History:  Procedure Laterality Date   CARDIAC CATHETERIZATION     LEFT AND RIGHT HEART CATHETERIZATION WITH CORONARY ANGIOGRAM N/A 09/10/2012   Procedure: LEFT AND RIGHT HEART CATHETERIZATION WITH CORONARY ANGIOGRAM;  Surgeon: Dolores Patty, MD;  Location[facility] C[facility] LAB;  Service: Cardiovascular;  Laterality: N/A;   MULTIPLE EXTRACTIONS WITH ALVEOLOPLASTY N/A 02/16/2017   Procedure: MULTIPLE EXTRACTION WITH ALVEOLOPLASTY;  Surgeon: [name], DDS;  Location: [facility] OR;  Service: Oral Surgery;  Laterality: N/A;   Family History: Family History  Problem Relation Age of Onset   Sickle cell anemia Brother    Social History: Social History   Socioeconomic History   Marital status: Single    Spouse name: Not on file   Number of children: 2   Years of education: Not on file   Highest education level: Not on file  Occupational History   Not on file  Tobacco Use   Smoking status: Former    Current packs/day: 0.50    Average packs/day: 0.5 packs/day for 20.0 years (10.0 ttl pk-yrs)    Types: Cigarettes   Smokeless tobacco: Never  Vaping Use   Vaping status: Never Used  Substance and Sexual Activity   Alcohol use: No   Drug use: No   Sexual activity: Never  Other Topics Concern   Not on file  Social History Narrative   Right handed   Wear glasses    Drinks coffee one cup in am   Social Drivers of Health   Financial Resource Strain: Not on file  Food  Insecurity: No Food Insecurity (12/13/2023)   Hunger Vital Sign    Worried About Running Out of Food in the Last Year: Never true    Ran Out of Food in the Last Year: Never true  Transportation Needs: No Transportation Needs (12/13/2023)   PRAPARE - Administrator, Civil Service (Medical): No    Lack of Transportation (Non-Medical): No  Physical Activity: Not on file  Stress: Not on file  Social Connections: Moderately Integrated (12/13/2023)   Social Connection and Isolation Panel [NHANES]    Frequency of Communication with Friends and Family: More than three times a week    Frequency of Social Gatherings with Friends and Family: Three times a week    Attends Religious Services: 1 to 4 times per year  Active Member of Clubs or Organizations: No    Attends Banker Meetings: 1 to 4 times per year    Marital Status: Never married   Allergies:  Allergies  Allergen Reactions   Vicodin [Hydrocodone-Acetaminophen] Nausea And Vomiting   Objective:    Vital Signs:   Temp:  [98 F (36.7 C)-99 F (37.2 C)] 98.5 F (36.9 C) (03/28 0801) Pulse Rate:  [81-107] 83 (03/28 0801) Resp:  [15-29] 15 (03/28 0801) BP: (107-155)/(70-107) 124/82 (03/28 0801) SpO2:  [95 %-98 %] 95 % (03/28 0801) Weight:  [45.6 kg-46.2 kg] 45.6 kg (03/28 0537) Last BM Date : 12/12/23  Weight change: Filed Weights   12/13/23 1128 12/13/23 1813 12/14/23 0537  Weight: 48.1 kg 46.2 kg 45.6 kg   Intake/Output:  Intake/Output Summary (Last 24 hours) at 12/14/2023 1151 Last data filed at 12/14/2023 1012 Gross per 24 hour  Intake 600 ml  Output 1350 ml  Net -750 ml    Physical Exam    General: Well appearing. No distress on RA Cardiac: JVP ~9cm. S1 and S2 present. 4/6 systolic murmur at apex Resp: Lung sounds clear and equal B/L Abdomen: Soft, non-tender, non-distended.  Extremities: Warm and dry.  Trace peripheral edema.  Neuro: Alert and oriented x3. Affect pleasant. Moves all  extremities without difficulty.  Telemetry   SR in 80-90s (personally reviewed)  EKG    EKG as above  Labs   Basic Metabolic Panel: Recent Labs  Lab 12/13/23 1129 12/13/23 1459 12/14/23 0426  NA 138  --  140  K 3.5  --  3.4*  CL 107  --  104  CO2 21*  --  27  GLUCOSE 121*  --  122*  BUN 24*  --  25*  CREATININE 1.55*  --  1.56*  CALCIUM 8.7*  --  8.7*  MG  --  2.0  --    Liver Function Tests: Recent Labs  Lab 12/14/23 0426  AST 25  ALT 28  ALKPHOS 86  BILITOT 0.5  PROT 6.7  ALBUMIN 3.0*   CBC: Recent Labs  Lab 12/13/23 1129 12/14/23 0426  WBC 4.9 4.7  HGB 7.3* 6.7*  HCT 22.3* 20.0*  MCV 84.8 83.3  PLT 275 264   Cardiac Enzymes: No results for input(s): "CKTOTAL", "CKMB", "CKMBINDEX", "TROPONINI" in the last 168 hours.  BNP: BNP (last 3 results) Recent Labs    12/13/23 1129  BNP 3,144.7*   Imaging   No results found.  Medications:    Current Medications:  sodium chloride   Intravenous Once   atorvastatin  80 mg Oral Daily   carvedilol  25 mg Oral BID WC   ezetimibe  10 mg Oral QPM   feeding supplement  237 mL Oral BID BM   furosemide  60 mg Intravenous BID   isosorbide mononitrate  30 mg Oral QPM   sacubitril-valsartan  1 tablet Oral BID   sodium chloride flush  3 mL Intravenous Q12H   spironolactone  25 mg Oral QPM   Infusions:  Patient Profile   Ms. 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    Assessment/Plan   1. Acute on chronic systolic HF:  - Ischemic cardiomyopathy. 2019:  EF ~35%  She has a large inferior wall aneurysm.  - Echo 2/22: EF 30-35%  large aneurysm at the base to mid inferior  wall. Mild to moderate MR.  - Elevated BNP 3100 with shortness of breath - Echo today with EF ~40% with moderate MR. Concerned that MR cause of symptoms, diurese. - Volume up. Continue Lasix  60 mg IV bid today. - Start Torsemide 20 mg daily tomorrow - Continue Entresto 97/103 - Continue imdur 30 mg daily.  - Continue spiro 25mg  - Continue carvedilol 25 mg bid - Unable to afford SGLT2i co-pay - Symptoms out of proportion to HF, likely caused by MR. Will optimize with diuresis then re-eval as an outpatient. May eventually need Structural evaluation   2. MR - mild to mod on echo 2/22 - moderate to severe on Dr. Gala Romney read - suspect that this is the driver of her symptoms as the EF remains unchanged  3. CAD:  - Not on ASA with warfarin use.  - hstrop 44>60>20 - Continue atorvastatin + ? blocker + zetia   4. Acute on Chronic Anemia - baseline hgb 7-8, likely related to CKD - on admit hgb 6.7 - received 1u RBCs - Tsats 10, iron 25 - d/w pharmD IV Fe dosing  5. CKD 3a - baseline Cr 1.5. stable.  6. LV aneurysm:   - Felt to be high-risk for embolic phenomenon -> Continue coumadin. Dosing per coumadin clinic - No bleeding. CBC today   7. HTN - Stable. Meds as above. - Tight BP control with MR  8. MGUS Followed by Dr Welton Flakes.   Length of Stay: 0  [place] Lee, NP  12/14/2023, 11:51 AM  Advanced Heart Failure Team Pager [phone] (M-F; 7a - 5p)  Please contact [facility] for night-coverage after hours (4p -7a ) and weekends on [email or web address]  Patient seen and examined with the above-signed Advanced Practice Provider and/or Housestaff. I personally reviewed laboratory data, imaging studies and relevant notes. I independently examined the patient and formulated the important aspects of the plan. I have edited the note to reflect any of my changes or salient points. I have personally discussed the plan with the patient and/or family.  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   Previously followed in the HF Clinic but has not followed up since 3/23.  Overall has been relatively  stable, over past week progressive HF symptoms.  Labs notable for K3.5, CO2 21, BUN/Cr 24/1.55, BNP 3144, hs-trop 44>60>20  Symptoms much improved with IV lasix.   Echo reviewed personally EF ~40% moderate to severe MR   Suspect she is still smoking but she denies it.   General:  Elderly. Thin No resp difficulty HEENT: normal Neck: supple. nJVP 6-7 Carotids 2+ bilat; no bruits. No lymphadenopathy or thryomegaly appreciated. Cor: Regular rate & rhythm.3/6 MR Lungs: markedly decreased Abdomen: soft, nontender, nondistended. No hepatosplenomegaly. No bruits or masses. Good bowel sounds. Extremities: no cyanosis, clubbing, rash, edema Neuro: alert & orientedx3, cranial nerves grossly intact. moves all 4 extremities w/o difficulty. Affect pleasant  Suspect presentation due primarily to decompensated HF with her anemia likely contributing. No evidence of ACS.   EF unchanged on echo but bot echo and exam notable for significant MR.   Will need trial of medical therapy (including diuretics). If fails, can consider evaluation for mitraClip   Volume status seems back to baseline. Continue current GDMT. Add torsemide 20 daily tomorrow.   Management of anemia per primary team. It is possible she may be hemolyzing off of MV and contribute to anemia. Check LDH and  haptoglobin.  Suspect she will be suitable for d/c from HF perspective tomorrow on previous GDMT and torsemide 20 daily.   Will arrange outpatient HF f/u.   The AHF team will sign off.   Arvilla Meres, MD  4:01 PM

## 2023-12-14 NOTE — Progress Notes (Signed)
  Echocardiogram 2D Echocardiogram has been performed.  [name] RDCS 12/14/2023, 10:14 AM

## 2023-12-14 NOTE — TOC Transition Note (Signed)
 Transition of Care [facility]) - Discharge Note   Patient Details  Name: Faith Gray MRN: [id number] Date of Birth: 1941/08/29  [facility]) CM/SW Contact:  Elliot Cousin, RN Phone Number: [phone] 12/14/2023, 8:41 PM   Clinical Narrative:    TOC CM spoke to pt and she lives with dtr. States she was independent pta. She is ambulatory without DME. Family will provide transportation home. Pt states she does daily weights. And tries to adhere to a heart healthy diet.    Final next level of care: Home/Self Care Barriers to Discharge: No Barriers Identified   Patient Goals and CMS Choice Patient states their goals for this hospitalization and ongoing recovery are:: wants to recover          Discharge Placement                       Discharge Plan and Services Additional resources added to the After Visit Summary for     Discharge Planning Services: CM Consult                                 Social Drivers of Health (SDOH) Interventions SDOH Screenings   Food Insecurity: No Food Insecurity (12/13/2023)  Housing: Low Risk  (12/13/2023)  Transportation Needs: No Transportation Needs (12/13/2023)  Utilities: Not At Risk (12/13/2023)  Social Connections: Moderately Integrated (12/13/2023)  Tobacco Use: Medium Risk (12/13/2023)     Readmission Risk Interventions     No data to display

## 2023-12-14 NOTE — Plan of Care (Signed)

## 2023-12-14 NOTE — Progress Notes (Signed)
 [facility]                                                                              Faith Gray, is a 83 y.o. female, DOB - 31-May-1941, [id number] Admit date - 12/13/2023    Outpatient Primary MD for the patient is [name], PA-C  LOS - 0  days  Chief Complaint  Patient presents with   Shortness of Breath   bronchitis   Chest Pain       Brief summary   Patient is a 83 year old female with HTN, hyperlipidemia, CKD stage IIIb, CAD, TIA, chronic iron deficiency anemia, chronic combined systolic and diastolic CHF, LV aneurysm on Eliquis presenting with shortness of breath.  Patient reported 3 days of shortness of breath, saw her PCP on initially suspected bronchitis and prescribed steroids.  Chest x-ray was checked which showed pulmonary edema and was prescribed torsemide without improvement.  Patient presented to ED for further evaluation.  She noted's worsening lower extremity edema and increased DOE. In ED, vital signs on arrival temp 98 F, pulse 103, BP 149/92, O2 sats 93% on room air Chest x-ray showed bilateral interstitial coarsening with streaky right basilar opacities which could reflect asymmetric pulmonary edema versus infiltrate.   Assessment & Plan    Principal Problem:   Acute on chronic combined systolic (congestive) and diastolic (congestive) heart failure (HCC) -Presented with worsening shortness of breath for last several days, DOE, lower extremity edema, chest x-ray with asymmetric pulm edema versus infiltrate.  Patient was started on p.o. torsemide outpatient with no significant improvement. -BNP 3144.7, troponin 44-> 60-  -Previous echo 12/2022 had shown EF of 35 to 40%, G1 DD, aortic dilation, moderate dilation of suprarenal abdominal aorta, 3.1.  Will repeat 2D echo -Continue IV Lasix 60 mg every 12 hours, strict I's and O's and daily weights -Continue Coreg, Entresto, Imdur, Aldactone, cardiology consulted.   History of LV  aneurysm -Patient has been transitioning from eliquis to Xarelto but has not picked it up yet reportedly due to cost -Hold Eliquis this morning due to anemia, hemoglobin 6.7.  Received eliquis last night.  Acute on chronic normocytic anemia secondary to MGUS Chronic anemia likely due to CKD stage IIIb -Hemoglobin has been drifting down recently, 7.7 on 01/09/2023, this morning 6.7.  -Patient reported no bleeding issues, no hematochezia, melena, hematemesis or hematuria,  Follow FOBT -Patient has been following with hematology/oncology regarding anemia (Dr Adella Nissen [facility]). Patient was found to have MGUS, IgM lambda M spike in beta-2 region 0.7G/DL during workup in 95/6213.  Skeletal survey was negative for multiple myeloma. -Continue packed RBC transfusion -Anemia profile showed iron 25, percent saturation ratio 10, ferritin 75 -If FOBT negative, will resume eliquis  Bronchitis -Patient had reported cough.  -Continue albuterol as needed, Mucinex   Hypertension - Continue home Coreg, Entresto, Imdur, spironolactone -Continue IV Lasix 60 mg twice daily  -Hold hydralazine, amlodipine   Hyperlipidemia - Continue home Zetia   History of TIA - Continue home Zetia -Hold Eliquis for now until FOBT available, resume if negative   CAD -On Zetia, Eliquis (held) - Continue home Coreg, [place], Imdur,  Zetia   CKD 3B -Baseline creatinine 1.4-1.5 -Currently at baseline - Trend renal function and electrolytes   Hypokalemia Replaced   Estimated body mass index is 20.3 kg/m as calculated from the following:   Height as of this encounter: 4\' 11"  (1.499 m).   Weight as of this encounter: 45.6 kg.  Code Status: Full code DVT Prophylaxis:  Eliquis currently on hold  Level of Care: Level of care: Telemetry Cardiac Family Communication: Updated patient's grandson at the bedside Disposition Plan:      Remains inpatient appropriate:      Procedures:    Consultants:    Cardiology  Antimicrobials:   Anti-infectives (From admission, onward)    None          Medications  sodium chloride   Intravenous Once   atorvastatin  80 mg Oral Daily   carvedilol  25 mg Oral BID WC   ezetimibe  10 mg Oral QPM   feeding supplement  237 mL Oral BID BM   furosemide  60 mg Intravenous BID   isosorbide mononitrate  30 mg Oral QPM   sacubitril-valsartan  1 tablet Oral BID   sodium chloride flush  3 mL Intravenous Q12H   spironolactone  25 mg Oral QPM      Subjective:   Faith Gray was seen and examined today.  No acute complaints, feeling somewhat better today after Lasix.  No nausea vomiting, chest pain, abdominal pain.  No fevers.  Still has some cough and congestion.  Objective:   Vitals:   12/13/23 1922 12/13/23 2350 12/14/23 0537 12/14/23 0801  BP: 118/75 116/87 107/70 124/82  Pulse: 81 88 86 83  Resp: 18 20 16 15   Temp: 98.6 F (37 C) 98.2 F (36.8 C) 98.7 F (37.1 C) 98.5 F (36.9 C)  TempSrc: Oral Oral Oral Oral  SpO2: 96% 97% 98% 95%  Weight:   45.6 kg   Height:        Intake/Output Summary (Last 24 hours) at 12/14/2023 1017 Last data filed at 12/14/2023 1012 Gross per 24 hour  Intake 600 ml  Output 1350 ml  Net -750 ml     Wt Readings from Last 3 Encounters:  12/14/23 45.6 kg  01/16/23 48.4 kg  01/07/23 49.4 kg     Exam General: Alert and oriented x 3, NAD Cardiovascular: S1 S2 auscultated,  RRR Respiratory: Mild scattered rhonchi with scattered wheezing, decreased breath sound at the bases Gastrointestinal: Soft, nontender, nondistended, + bowel sounds Ext: trace pedal edema bilaterally Neuro: no new deficits Skin: No rashes Psych: Normal affect     Data Reviewed:  I have personally reviewed following labs    CBC Lab Results  Component Value Date   WBC 4.7 12/14/2023   RBC 2.40 (L) 12/14/2023   HGB 6.7 (LL) 12/14/2023   HCT 20.0 (L) 12/14/2023   MCV 83.3 12/14/2023   MCH 27.9 12/14/2023   PLT 264  12/14/2023   MCHC 33.5 12/14/2023   RDW 12.7 12/14/2023   LYMPHSABS 2.1 01/07/2023   MONOABS 0.5 01/07/2023   EOSABS 0.1 01/07/2023   BASOSABS 0.0 01/07/2023     Last metabolic panel Lab Results  Component Value Date   NA 140 12/14/2023   K 3.4 (L) 12/14/2023   CL 104 12/14/2023   CO2 27 12/14/2023   BUN 25 (H) 12/14/2023   CREATININE 1.56 (H) 12/14/2023   GLUCOSE 122 (H) 12/14/2023   GFRNONAA 33 (L) 12/14/2023   GFRAA 42 (L) 06/04/2020  CALCIUM 8.7 (L) 12/14/2023   PROT 6.7 12/14/2023   ALBUMIN 3.0 (L) 12/14/2023   BILITOT 0.5 12/14/2023   ALKPHOS 86 12/14/2023   AST 25 12/14/2023   ALT 28 12/14/2023   ANIONGAP 9 12/14/2023    CBG (last 3)  No results for input(s): "GLUCAP" in the last 72 hours.    Coagulation Profile: No results for input(s): "INR", "PROTIME" in the last 168 hours.   Radiology Studies: I have personally reviewed the imaging studies  DG Chest 2 View Result Date: 12/13/2023 CLINICAL DATA:  Shortness of breath. EXAM: CHEST - 2 VIEW COMPARISON:  Chest radiograph dated 01/08/2023. FINDINGS: The heart size and mediastinal contours are within normal limits. Aortic atherosclerosis. Diffuse bilateral interstitial coarsening. Streaky opacities at the right lung base. No pleural effusion or pneumothorax. Diffusely decreased osseous mineralization. Partially visualized levoscoliosis of the lumbar spine. No acute osseous abnormality. IMPRESSION: Bilateral interstitial coarsening with streaky right basilar opacities, which could reflect asymmetric pulmonary edema versus infiltrate. Electronically Signed   By: Hart Robinsons M.D.   On: 12/13/2023 12:44       Alyne Martinson M.D. [facility] 12/14/2023, 10:17 AM  Available via Epic secure chat 7am-7pm After 7 pm, please refer to night coverage provider listed on amion.

## 2023-12-14 NOTE — TOC Initial Note (Signed)
 Transition of Care [facility]) - Initial/Assessment Note    Patient Details  Name: Faith Gray MRN: [id number] Date of Birth: [birth year]  Transition of Care [facility]) CM/SW Contact:    Nicanor Bake Phone Number: [phone] 12/14/2023, 4:36 PM  Clinical Narrative:  HF CSW met with patient at bedside. Patient stated that she lives with her grandson. Patient stated that she has no history of HH services. Patient stated that she does not use any equipment. Patient stated that she has a scale at home. Patient stated that she does  has a PCP. CSW explained that a hospital follow up appointment is typically scheduled closer to dc. Patient agrees.  Patient stated that she uses transportation services to get to/from appointments. Patient stated that her grandson or granddaughter will provide transportation at dc.                      Patient Goals and CMS Choice            Expected Discharge Plan and Services                                              Prior Living Arrangements/Services                       Activities of Daily Living   ADL Screening (condition at time of admission) Independently performs ADLs?: Yes (appropriate for developmental age) Is the patient deaf or have difficulty hearing?: No Does the patient have difficulty seeing, even when wearing glasses/contacts?: No Does the patient have difficulty concentrating, remembering, or making decisions?: No  Permission Sought/Granted                  Emotional Assessment              Admission diagnosis:  Acute on chronic combined systolic (congestive) and diastolic (congestive) heart failure (HCC) [I50.43] Acute on chronic congestive heart failure, unspecified heart failure type (HCC) [I50.9] Patient Active Problem List   Diagnosis Date Noted   Acute on chronic combined systolic (congestive) and diastolic (congestive) heart failure (HCC) 12/13/2023   History of TIA (transient  ischemic attack) 01/08/2023   Diplopia 01/08/2023   Hypocalcemia 01/08/2023   Chronic kidney disease, stage 3b (HCC) 01/08/2023   Hyperlipidemia 01/08/2023   MGUS (monoclonal gammopathy of unknown significance) 01/08/2023   Syncope 02/24/2013   Essential hypertension 10/04/2012   Chronic combined systolic and diastolic congestive heart failure (HCC) 09/17/2012   Aneurysm of left ventricle of heart 09/16/2012   Long term (current) use of anticoagulants 09/16/2012   CAD (coronary artery disease) 09/11/2012   History of non-ST elevation myocardial infarction (NSTEMI) 09/11/2012   SOB (shortness of breath) 09/07/2012   EKG abnormalities 09/07/2012   Normochromic anemia 09/07/2012   PCP:  [name], PA-C Pharmacy:   [id number] - [place], [place] - [address] [place] [address] Phone: [phone] Fax: [phone]  [facility] [address] [place] [address] Phone: [phone] Fax: [phone]  [facility] - [place], [place] - [address] [place] [address] Phone: [phone] Fax: [phone]     Social Drivers of Health (SDOH) Social History: SDOH Screenings   Food Insecurity: No Food Insecurity (12/13/2023)  Housing: Low Risk  (  12/13/2023)  Transportation Needs: No Transportation Needs (12/13/2023)  Utilities: Not At Risk (12/13/2023)  Social Connections: Moderately Integrated (12/13/2023)  Tobacco Use: Medium Risk (12/13/2023)   SDOH Interventions:     Readmission Risk Interventions     No data to display

## 2023-12-14 NOTE — Care Management Obs Status (Signed)
 MEDICARE OBSERVATION STATUS NOTIFICATION   Patient Details  Name: Faith Gray MRN: [id number] Date of Birth: [birth year]   Medicare Observation Status Notification Given:  Yes    Elliot Cousin, RN 12/14/2023, 8:37 PM

## 2023-12-14 NOTE — Progress Notes (Signed)
 Acute on chronic anemia RN reported that CBC showing low hemoglobin 6.7.  Patient baseline hemoglobin is around 7.3-8.8.  Anemia panel showing ferritin within normal range, low iron and low iron saturation. Anemia of chronic disease in the setting of CKD stage IIIb. -Obtaining stat type and screen and transfusing 1 unit of blood. -Checking FOBT. -Continue monitor H&H and transfuse as needed to keep hemoglobin above 8.   Tereasa Coop, MD [facility] 12/14/2023, 5:18 AM

## 2023-12-15 ENCOUNTER — Other Ambulatory Visit (HOSPITAL_COMMUNITY): Payer: Self-pay

## 2023-12-15 DIAGNOSIS — I10 Essential (primary) hypertension: Secondary | ICD-10-CM | POA: Diagnosis not present

## 2023-12-15 DIAGNOSIS — I2510 Atherosclerotic heart disease of native coronary artery without angina pectoris: Secondary | ICD-10-CM

## 2023-12-15 DIAGNOSIS — N1832 Chronic kidney disease, stage 3b: Secondary | ICD-10-CM | POA: Diagnosis not present

## 2023-12-15 DIAGNOSIS — D649 Anemia, unspecified: Secondary | ICD-10-CM | POA: Diagnosis not present

## 2023-12-15 DIAGNOSIS — I5043 Acute on chronic combined systolic (congestive) and diastolic (congestive) heart failure: Secondary | ICD-10-CM | POA: Diagnosis not present

## 2023-12-15 LAB — RENAL FUNCTION PANEL
Albumin: 3.3 g/dL — ABNORMAL LOW (ref 3.5–5.0)
Anion gap: 13 (ref 5–15)
BUN: 28 mg/dL — ABNORMAL HIGH (ref 8–23)
CO2: 23 mmol/L (ref 22–32)
Calcium: 8.9 mg/dL (ref 8.9–10.3)
Chloride: 100 mmol/L (ref 98–111)
Creatinine, Ser: 1.53 mg/dL — ABNORMAL HIGH (ref 0.44–1.00)
GFR, Estimated: 34 mL/min — ABNORMAL LOW (ref >60)
Glucose, Bld: 112 mg/dL — ABNORMAL HIGH (ref 70–99)
Phosphorus: 3.5 mg/dL (ref 2.5–4.6)
Potassium: 4.1 mmol/L (ref 3.5–5.1)
Sodium: 136 mmol/L (ref 135–145)

## 2023-12-15 LAB — BPAM RBC
Blood Product Expiration Date: 202503312359
ISSUE DATE / TIME: 202503281447
Unit Type and Rh: 1700

## 2023-12-15 LAB — TYPE AND SCREEN
ABO/RH(D): B NEG
Antibody Screen: NEGATIVE
Unit division: 0

## 2023-12-15 LAB — CBC
HCT: 26 % — ABNORMAL LOW (ref 36.0–46.0)
Hemoglobin: 9 g/dL — ABNORMAL LOW (ref 12.0–15.0)
MCH: 28.1 pg (ref 26.0–34.0)
MCHC: 34.6 g/dL (ref 30.0–36.0)
MCV: 81.3 fL (ref 80.0–100.0)
Platelets: 326 10*3/uL (ref 150–400)
RBC: 3.2 MIL/uL — ABNORMAL LOW (ref 3.87–5.11)
RDW: 13 % (ref 11.5–15.5)
WBC: 6.3 10*3/uL (ref 4.0–10.5)
nRBC: 0.8 % — ABNORMAL HIGH (ref 0.0–0.2)

## 2023-12-15 LAB — LACTATE DEHYDROGENASE: LDH: 195 U/L — ABNORMAL HIGH (ref 98–192)

## 2023-12-15 MED ORDER — APIXABAN 2.5 MG PO TABS
2.5000 mg | ORAL_TABLET | Freq: Two times a day (BID) | ORAL | Status: DC
  Administered 2023-12-15: 2.5 mg via ORAL
  Filled 2023-12-15: qty 1

## 2023-12-15 MED ORDER — GUAIFENESIN ER 600 MG PO TB12
600.0000 mg | ORAL_TABLET | Freq: Two times a day (BID) | ORAL | 0 refills | Status: DC
  Filled 2023-12-15: qty 30, 15d supply, fill #0

## 2023-12-15 MED ORDER — APIXABAN 2.5 MG PO TABS
2.5000 mg | ORAL_TABLET | Freq: Two times a day (BID) | ORAL | 3 refills | Status: DC
  Filled 2023-12-15: qty 60, 30d supply, fill #0

## 2023-12-15 MED ORDER — GUAIFENESIN ER 600 MG PO TB12
600.0000 mg | ORAL_TABLET | Freq: Two times a day (BID) | ORAL | Status: DC
  Administered 2023-12-15: 600 mg via ORAL
  Filled 2023-12-15: qty 1

## 2023-12-15 MED ORDER — BENZONATATE 100 MG PO CAPS
100.0000 mg | ORAL_CAPSULE | Freq: Three times a day (TID) | ORAL | 0 refills | Status: DC | PRN
  Filled 2023-12-15: qty 20, 7d supply, fill #0

## 2023-12-15 MED ORDER — TORSEMIDE 20 MG PO TABS
20.0000 mg | ORAL_TABLET | Freq: Every day | ORAL | 3 refills | Status: DC
  Filled 2023-12-15: qty 30, 30d supply, fill #0

## 2023-12-15 MED ORDER — BENZONATATE 100 MG PO CAPS
100.0000 mg | ORAL_CAPSULE | Freq: Three times a day (TID) | ORAL | Status: DC
  Administered 2023-12-15 (×2): 100 mg via ORAL
  Filled 2023-12-15 (×2): qty 1

## 2023-12-15 NOTE — Discharge Summary (Signed)
 Physician Discharge Summary   Patient: Faith Gray MRN: [id number] DOB: 08/04/1941  Admit date:     12/13/2023  Discharge date: 12/15/23  Discharge Physician: Thad Ranger, MD    PCP: [name], PA-C   Recommendations at discharge:   Amlodipine, hydralazine held Continue Entresto, Imdur, spironolactone, Coreg Placed on torsemide 20 mg daily Recommended outpatient follow-up with cardiology in 2 weeks Recommended out patient follow up with PCP, hematology, Dr Welton Flakes in 7 to 10 days, repeat CBC.  Discharge Diagnoses:    Acute on chronic combined systolic (congestive) and diastolic (congestive) heart failure (HCC)   Essential hypertension   Chronic kidney disease, stage 3b (HCC)   Acute on chronic normochromic anemia   MGUS   CAD (coronary artery disease)   Hyperlipidemia   History of TIA (transient ischemic attack)   History of non-ST elevation myocardial infarction (NSTEMI)    Hospital Course:  Patient is a 83 year old female with HTN, hyperlipidemia, CKD stage IIIb, CAD, TIA, chronic iron deficiency anemia, chronic combined systolic and diastolic CHF, LV aneurysm on Eliquis presenting with shortness of breath.  Patient reported 3 days of shortness of breath, saw her PCP on initially suspected bronchitis and prescribed steroids.  Chest x-ray was checked which showed pulmonary edema and was prescribed torsemide without improvement.  Patient presented to ED for further evaluation.  She noted's worsening lower extremity edema and increased DOE. In ED, vital signs on arrival temp 98 F, pulse 103, BP 149/92, O2 sats 93% on room air Chest x-ray showed bilateral interstitial coarsening with streaky right basilar opacities which could reflect asymmetric pulmonary edema versus infiltrate.   Assessment and Plan: Acute on chronic combined systolic (congestive) and diastolic (congestive) heart failure (HCC) -Presented with worsening shortness of breath for last several days, DOE,  lower extremity edema, chest x-ray with asymmetric pulm edema versus infiltrate.  Patient was started on p.o. torsemide outpatient with no significant improvement. -BNP 3144.7, troponin 44-> 60  -2D echo showed EF of 40 to 45%, G2 DD degenerative mitral valve.  Moderate mitral valve regurgitation. -Received Lasix 60 mg every 12 hours, cardiology was consulted -Negative balance of 2.475 L, weight down from 106 lbs -> 100 on discharge -Continue Coreg, Entresto, Imdur, Aldactone -Seen by Dr. Gala Romney, heart failure team, recommended torsemide 20 mg daily -Seen by PT, cleared, at baseline.  Did not require any home O2.   History of LV aneurysm -Patient has been transitioning from eliquis to Xarelto but has not picked it up yet reportedly due to cost -Resume Eliquis 2.5 milligram twice daily, hemoglobin stable   Acute on chronic normocytic anemia secondary to MGUS Chronic anemia likely due to CKD stage IIIb -Hemoglobin has been drifting down recently, 7.7 on 01/09/2023, was 6.7 on 3/28 -Patient reported no bleeding issues, no hematochezia, melena, hematemesis or hematuria,  Follow FOBT -Patient has been following with hematology/oncology regarding anemia (Dr Adella Nissen [facility]). Patient was found to have MGUS, IgM lambda M spike in beta-2 region 0.7G/DL during workup in 14/7829.  Skeletal survey was negative for multiple myeloma. -Received 1 unit packed RBCs, IV iron -Anemia profile showed iron 25, percent saturation ratio 10, ferritin 75 -Eliquis resumed.  Patient recommended to have follow-up appointment with PCP or hematology, Dr. Welton Flakes, repeat CBC in next 7 to 10 days.  Patient may need as needed transfusions.   Bronchitis -Patient had reported cough.  -Continue albuterol as needed, Mucinex   Hypertension - Continue home Coreg, Entresto, Imdur, spironolactone -Started torsemide -Hold hydralazine, amlodipine  Hyperlipidemia - Continue home Zetia   History of TIA - Continue home  Zetia, eliquis CAD -On Zetia, Eliquis (held) - Continue home Coreg, Entresto, Imdur, Zetia   CKD 3B -Baseline creatinine 1.4-1.5 -Currently at baseline, 1.5     Hypokalemia Replaced    Estimated body mass index is 20.3 kg/m as calculated from the following:   Height as of this encounter: 4\' 11"  (1.499 m).   Weight as of this encounter: 45.6 kg.       Pain control - [employer] Controlled Substance Reporting System database was reviewed. and patient was instructed, not to drive, operate heavy machinery, perform activities at heights, swimming or participation in water activities or provide baby-sitting services while on Pain, Sleep and Anxiety Medications; until their outpatient Physician has advised to do so again. Also recommended to not to take more than prescribed Pain, Sleep and Anxiety Medications.  Consultants: Cardiology, Dr. Gala Romney Procedures performed: 2D echo Disposition: Home Diet recommendation:  Discharge Diet Orders (From admission, onward)     Start     Ordered   12/15/23 0000  Diet - low sodium heart healthy        12/15/23 0918            DISCHARGE MEDICATION: Allergies as of 12/15/2023       Reactions   Vicodin [hydrocodone-acetaminophen] Nausea And Vomiting        Medication List     STOP taking these medications    amLODipine 10 MG tablet Commonly known as: NORVASC   hydrALAZINE 100 MG tablet Commonly known as: APRESOLINE   rivaroxaban 10 MG Tabs tablet Commonly known as: XARELTO       TAKE these medications    apixaban 2.5 MG Tabs tablet Commonly known as: ELIQUIS Take 1 tablet (2.5 mg total) by mouth 2 (two) times daily.   atorvastatin 80 MG tablet Commonly known as: LIPITOR TAKE 1 TABLET BY MOUTH DAILY AT 6PM What changed: See the new instructions.   benzonatate 100 MG capsule Commonly known as: TESSALON Take 1 capsule (100 mg total) by mouth 3 (three) times daily as needed for cough.   carvedilol 25 MG  tablet Commonly known as: COREG Take 25 mg by mouth 2 (two) times daily with a meal.   Entresto 97-103 MG Generic drug: sacubitril-valsartan TAKE 1 TABLET TWICE DAILY  (DOSE  CHANGE) What changed: See the new instructions.   ezetimibe 10 MG tablet Commonly known as: Zetia Take 1 tablet (10 mg total) by mouth daily. What changed: when to take this   ferrous sulfate 325 (65 FE) MG EC tablet Take 325 mg by mouth daily after breakfast.   guaiFENesin 600 MG 12 hr tablet Commonly known as: MUCINEX Take 1 tablet (600 mg total) by mouth 2 (two) times daily.   isosorbide mononitrate 30 MG 24 hr tablet Commonly known as: IMDUR Take 30 mg by mouth every evening.   Jardiance 10 MG Tabs tablet Generic drug: empagliflozin TAKE 1 TABLET EVERY DAY What changed:  how much to take when to take this reasons to take this   pregabalin 25 MG capsule Commonly known as: LYRICA Take 25 mg by mouth as needed (nerve pain).   spironolactone 25 MG tablet Commonly known as: ALDACTONE TAKE 1 TABLET EVERY DAY (NEED MD APPOINTMENT) What changed: See the new instructions.   torsemide 20 MG tablet Commonly known as: DEMADEX Take 1 tablet (20 mg total) by mouth daily. Start taking on: December 16, 2023 What changed:  medication strength how much to take when to take this   TYLENOL 500 MG tablet Generic drug: acetaminophen Take 500-1,000 mg by mouth every 6 (six) hours as needed for mild pain or headache.        Follow-up Information     [name], PA-C Follow up.   Specialty: Physician Assistant Why: please contact your PCP for hospital follow up in 1-2 weeks Contact information: [address] [place] [address] [phone]         Drue Second, MD. Schedule an appointment as soon as possible for a visit in 10 day(s).   Specialty: Oncology Why: for hospital follow-up, obtain labs CBC Contact information: [address] [place]  [address] [phone]         Bensimhon, Bevelyn Buckles, MD. Schedule an appointment as soon as possible for a visit in 2 week(s).   Specialty: Cardiology Why: for hospital follow-up Contact information: [address] [place] [address] [phone]                Discharge Exam: Ceasar Mons Weights   12/13/23 1813 12/14/23 0537 12/15/23 0553  Weight: 46.2 kg 45.6 kg 45.5 kg   S: Feeling much better today, no acute complaints, no chest pain or shortness of breath.  Cleared by PT to discharge home.  Did not require any DME O2.  Son at the bedside.  BP 123/88 (BP Location: Left Arm)   Pulse 60   Temp 97.7 F (36.5 C) (Oral)   Resp 18   Ht 4\' 11"  (1.499 m)   Wt 45.5 kg   SpO2 93%   BMI 20.24 kg/m   Physical Exam General: Alert and oriented x 3, NAD Cardiovascular: S1 S2 clear, RRR.  Respiratory: CTAB, no wheezing, rales or rhonchi Gastrointestinal: Soft, nontender, nondistended, NBS Ext: no pedal edema bilaterally Neuro: no new deficits Psych: Normal affect    Condition at discharge: fair  The results of significant diagnostics from this hospitalization (including imaging, microbiology, ancillary and laboratory) are listed below for reference.   Imaging Studies: ECHOCARDIOGRAM COMPLETE Result Date: 12/14/2023    ECHOCARDIOGRAM REPORT   Patient Name:   Ranyah Groeneveld Date of Exam: 12/14/2023 Medical Rec #:  [id number]     Height:       59.0 in Accession #:    [id number]    Weight:       100.5 lb Date of Birth:  December 24, 1940     BSA:          1.377 m Patient Age:    82 years      BP:           124/82 mmHg Patient Gender: F             HR:           67 bpm. Exam Location:  Inpatient Procedure: 2D Echo, 3D Echo, Cardiac Doppler, Color Doppler, Strain Analysis and            Intracardiac Opacification Agent (Both Spectral and Color Flow            Doppler were utilized during procedure). Indications:    CHF-acute systolic  History:        Patient has prior history  of Echocardiogram examinations, most                 recent 01/09/2023. CHF, CAD and Previous Myocardial Infarction,  Stroke and TIA, Signs/Symptoms:Syncope, Chest Pain and Shortness                 of Breath; Risk Factors:Hypertension and Dyslipidemia. NSTEMI,                 CKD stage 3.  Sonographer:    Vern Claude Referring Phys: Cecille Po MELVIN IMPRESSIONS  1. Left ventricular ejection fraction, by estimation, is 40 to 45%. Left ventricular ejection fraction by 2D MOD biplane is 42.7 %. The left ventricle has mildly decreased function. The left ventricle demonstrates regional wall motion abnormalities (see  scoring diagram/findings for description). Left ventricular diastolic parameters are consistent with Grade II diastolic dysfunction (pseudonormalization). Elevated left atrial pressure. The E/e' is 15.5.  2. Right ventricular systolic function is normal. The right ventricular size is normal. There is normal pulmonary artery systolic pressure. The estimated right ventricular systolic pressure is 26.0 mmHg.  3. Left atrial size was mildly dilated.  4. The mitral valve is degenerative. Moderate mitral valve regurgitation. No evidence of mitral stenosis. Moderate mitral annular calcification.  5. The aortic valve is tricuspid. Aortic valve regurgitation is not visualized. Aortic valve sclerosis is present, with no evidence of aortic valve stenosis.  6. The inferior vena cava is normal in size with greater than 50% respiratory variability, suggesting right atrial pressure of 3 mmHg. Conclusion(s)/Recommendation(s): No left ventricular mural or apical thrombus/thrombi. FINDINGS  Left Ventricle: Left ventricular ejection fraction, by estimation, is 40 to 45%. Left ventricular ejection fraction by 2D MOD biplane is 42.7 %. The left ventricle has mildly decreased function. The left ventricle demonstrates regional wall motion abnormalities. The left ventricular internal cavity size was normal in  size. There is no left ventricular hypertrophy. Left ventricular diastolic parameters are consistent with Grade II diastolic dysfunction (pseudonormalization). Elevated left atrial pressure. The E/e' is 15.5.  LV Wall Scoring: The basal inferior segment is akinetic. The mid and distal inferior wall, mid inferoseptal segment, and basal inferoseptal segment are hypokinetic. Right Ventricle: The right ventricular size is normal. No increase in right ventricular wall thickness. Right ventricular systolic function is normal. There is normal pulmonary artery systolic pressure. The tricuspid regurgitant velocity is 2.40 m/s, and  with an assumed right atrial pressure of 3 mmHg, the estimated right ventricular systolic pressure is 26.0 mmHg. Left Atrium: Left atrial size was mildly dilated. Right Atrium: Right atrial size was normal in size. Pericardium: There is no evidence of pericardial effusion. Mitral Valve: The mitral valve is degenerative in appearance. There is mild thickening of the mitral valve leaflet(s). Moderate mitral annular calcification. Moderate mitral valve regurgitation. No evidence of mitral valve stenosis. MV peak gradient, 6.4  mmHg. The mean mitral valve gradient is 2.0 mmHg. Tricuspid Valve: The tricuspid valve is normal in structure. Tricuspid valve regurgitation is mild . No evidence of tricuspid stenosis. Aortic Valve: The aortic valve is tricuspid. Aortic valve regurgitation is not visualized. Aortic valve sclerosis is present, with no evidence of aortic valve stenosis. Aortic valve mean gradient measures 6.0 mmHg. Aortic valve peak gradient measures 12.0 mmHg. Aortic valve area, by VTI measures 1.12 cm. Pulmonic Valve: The pulmonic valve was not well visualized. Pulmonic valve regurgitation is not visualized. No evidence of pulmonic stenosis. Aorta: The aortic root and ascending aorta are structurally normal, with no evidence of dilitation. Venous: The inferior vena cava is normal in size with  greater than 50% respiratory variability, suggesting right atrial pressure of 3 mmHg. IAS/Shunts: The interatrial septum was not  well visualized.  LEFT VENTRICLE PLAX 2D                        Biplane EF (MOD) LVIDd:         5.70 cm         LV Biplane EF:   Left LVIDs:         4.60 cm                          ventricular LV PW:         0.90 cm                          ejection LV IVS:        0.80 cm                          fraction by LVOT diam:     1.70 cm                          2D MOD LV SV:         33                               biplane is LV SV Index:   24                               42.7 %. LVOT Area:     2.27 cm                                Diastology                                LV e' medial:    5.22 cm/s LV Volumes (MOD)               LV E/e' medial:  19.2 LV vol d, MOD    235.0 ml      LV e' lateral:   8.16 cm/s A2C:                           LV E/e' lateral: 12.3 LV vol d, MOD    176.0 ml A4C: LV vol s, MOD    131.0 ml A2C: LV vol s, MOD    97.4 ml A4C: LV SV MOD A2C:   104.0 ml LV SV MOD A4C:   176.0 ml LV SV MOD BP:    86.5 ml RIGHT VENTRICLE             IVC RV Basal diam:  4.00 cm     IVC diam: 1.90 cm RV Mid diam:    2.60 cm RV S prime:     12.00 cm/s TAPSE (M-mode): 2.0 cm LEFT ATRIUM             Index        RIGHT ATRIUM           Index LA diam:        4.10 cm 2.98 cm/m   RA Area:  15.60 cm LA Vol (A2C):   44.3 ml 32.18 ml/m  RA Volume:   44.20 ml  32.11 ml/m LA Vol (A4C):   58.4 ml 42.42 ml/m LA Biplane Vol: 53.8 ml 39.08 ml/m  AORTIC VALVE                     PULMONIC VALVE AV Area (Vmax):    1.00 cm      PV Vmax:       0.99 m/s AV Area (Vmean):   0.91 cm      PV Peak grad:  3.9 mmHg AV Area (VTI):     1.12 cm AV Vmax:           173.00 cm/s AV Vmean:          114.000 cm/s AV VTI:            0.294 m AV Peak Grad:      12.0 mmHg AV Mean Grad:      6.0 mmHg LVOT Vmax:         76.50 cm/s LVOT Vmean:        45.500 cm/s LVOT VTI:          0.145 m LVOT/AV VTI ratio: 0.49  AORTA Ao  Root diam: 3.10 cm Ao Asc diam:  2.90 cm MITRAL VALVE                TRICUSPID VALVE MV Area (PHT): 2.92 cm     TV Peak grad:   22.8 mmHg MV Area VTI:   1.12 cm     TV Vmax:        2.39 m/s MV Peak grad:  6.4 mmHg     TR Peak grad:   23.0 mmHg MV Mean grad:  2.0 mmHg     TR Vmax:        240.00 cm/s MV Vmax:       1.26 m/s MV Vmean:      69.5 cm/s    SHUNTS MV Decel Time: 260 msec     Systemic VTI:  0.14 m MV E velocity: 100.00 cm/s  Systemic Diam: 1.70 cm MV A velocity: 110.00 cm/s MV E/A ratio:  0.91 Sunit Tolia Electronically signed by Tessa Lerner Signature Date/Time: 12/14/2023/8:06:42 PM    Final    DG Chest 2 View Result Date: 12/13/2023 CLINICAL DATA:  Shortness of breath. EXAM: CHEST - 2 VIEW COMPARISON:  Chest radiograph dated 01/08/2023. FINDINGS: The heart size and mediastinal contours are within normal limits. Aortic atherosclerosis. Diffuse bilateral interstitial coarsening. Streaky opacities at the right lung base. No pleural effusion or pneumothorax. Diffusely decreased osseous mineralization. Partially visualized levoscoliosis of the lumbar spine. No acute osseous abnormality. IMPRESSION: Bilateral interstitial coarsening with streaky right basilar opacities, which could reflect asymmetric pulmonary edema versus infiltrate. Electronically Signed   By: Hart Robinsons M.D.   On: 12/13/2023 12:44    Microbiology: No results found for this or any previous visit.  Labs: CBC: Recent Labs  Lab 12/13/23 1129 12/14/23 0426 12/15/23 0245  WBC 4.9 4.7 6.3  HGB 7.3* 6.7* 9.0*  HCT 22.3* 20.0* 26.0*  MCV 84.8 83.3 81.3  PLT 275 264 326   Basic Metabolic Panel: Recent Labs  Lab 12/13/23 1129 12/13/23 1459 12/14/23 0426 12/15/23 0245  NA 138  --  140 136  K 3.5  --  3.4* 4.1  CL 107  --  104 100  CO2 21*  --  27 23  GLUCOSE 121*  --  122* 112*  BUN 24*  --  25* 28*  CREATININE 1.55*  --  1.56* 1.53*  CALCIUM 8.7*  --  8.7* 8.9  MG  --  2.0  --   --   PHOS  --   --   --  3.5    Liver Function Tests: Recent Labs  Lab 12/14/23 0426 12/15/23 0245  AST 25  --   ALT 28  --   ALKPHOS 86  --   BILITOT 0.5  --   PROT 6.7  --   ALBUMIN 3.0* 3.3*   CBG: No results for input(s): "GLUCAP" in the last 168 hours.  Discharge time spent: greater than 30 minutes.  Signed: Thad Ranger, MD [facility] 12/15/2023

## 2023-12-15 NOTE — Plan of Care (Signed)
  Problem: Education: Goal: Ability to demonstrate management of disease process will improve Outcome: Progressing Goal: Ability to verbalize understanding of medication therapies will improve Outcome: Progressing   Problem: Cardiac: Goal: Ability to achieve and maintain adequate cardiopulmonary perfusion will improve Outcome: Progressing   Problem: Activity: Goal: Capacity to carry out activities will improve Outcome: Progressing

## 2023-12-15 NOTE — Evaluation (Signed)
 Physical Therapy Evaluation Patient Details Name: Faith Gray MRN: [id number] DOB: 08-07-1941 Today's Date: 12/15/2023  History of Present Illness  83 y.o. female presents to [facility] hospital on 12/13/23 with a 3 day hx of SOB, worsening LE edema, and DOE. Admitted with systolic and diastolic CHF. PMH includes CAD, HTN, LV aneurysm, chronic systolic & diastolic HF, HLD, hx of TIA and NSTEMI, CKD 3b  Clinical Impression  Pt is a 83 y.o. female presenting on 12/13/23 with above conditions and deficits below, see PT Problem List. PTA pt independently lived in an apartment with her grandson. Currently she is mod I for transfers s/t slightly increased time, and supervision for safety during ambulation. Pt is capable of independent bed mobility, transfers, and ambulation and is close/at her functional mobility baseline. Given pt's proximity to her functional baseline, available family support, PLOF, and anticipated progression no follow-up PT or acute PT is recommended. Pt would benefit from continued mobility with nurses and mobility specialists until d/c. Will sign off pt care.           If plan is discharge home, recommend the following: Assistance with cooking/housework;Assist for transportation   Can travel by private vehicle        Equipment Recommendations Other (comment) (shower stool, per pt request)  Recommendations for Other Services       Functional Status Assessment Patient has not had a recent decline in their functional status     Precautions / Restrictions Precautions Precautions: Fall Recall of Precautions/Restrictions: Intact Precaution/Restrictions Comments: low risk for falls Restrictions Weight Bearing Restrictions Per Provider Order: No      Mobility  Bed Mobility Overal bed mobility: Modified Independent Bed Mobility: Supine to Sit     Supine to sit: Modified independent (Device/Increase time), HOB elevated     General bed mobility comments: pt is mod I for  bed mobility s/t elevated HOB.  comes to EOB without issue. Capable of independent transfers    Transfers Overall transfer level: Modified independent Equipment used: None Transfers: Sit to/from Stand Sit to Stand: Modified independent (Device/Increase time)           General transfer comment: pt stands without difficulty from lowest bed height. Pt capable of independent transfers    Ambulation/Gait Ambulation/Gait assistance: Supervision Gait Distance (Feet): 120 Feet Assistive device: None Gait Pattern/deviations: Step-through pattern, Decreased stride length Gait velocity: normal Gait velocity interpretation: >2.62 ft/sec, indicative of community ambulatory   General Gait Details: pt walks with slightly reduced stride length. With braiding and head turns pt slightly reduces speed but no LOB noted. Pt is capable of independent ambulation  Stairs            Wheelchair Mobility     Tilt Bed    Modified Rankin (Stroke Patients Only)       Balance Overall balance assessment: Independent                                           Pertinent Vitals/Pain Pain Assessment Pain Assessment: Faces Faces Pain Scale: No hurt Pain Intervention(s): Monitored during session    Home Living Family/patient expects to be discharged to:: Private residence Living Arrangements: Other relatives Available Help at Discharge: Family;Available PRN/intermittently Type of Home: Apartment Home Access: Level entry       Home Layout: One level Home Equipment: None Additional Comments: lives with grandson and has other family  within close proximity for additional assistance    Prior Function Prior Level of Function : Independent/Modified Independent             Mobility Comments: independent ADLs Comments: independent. Doesn't drive s/t CHF     Extremity/Trunk Assessment   Upper Extremity Assessment Upper Extremity Assessment: Overall WFL for tasks  assessed    Lower Extremity Assessment Lower Extremity Assessment: Overall WFL for tasks assessed    Cervical / Trunk Assessment Cervical / Trunk Assessment: Normal  Communication   Communication Communication: No apparent difficulties    Cognition Arousal: Alert Behavior During Therapy: WFL for tasks assessed/performed   PT - Cognitive impairments: No apparent impairments                         Following commands: Intact       Cueing Cueing Techniques: Verbal cues     General Comments General comments (skin integrity, edema, etc.): pt's SpO2 remained > 96% on RA at rest and activity    Exercises     Assessment/Plan    PT Assessment Patient does not need any further PT services  PT Problem List         PT Treatment Interventions      PT Goals (Current goals can be found in the Care Plan section)  Acute Rehab PT Goals Patient Stated Goal: return home PT Goal Formulation: With patient/family Time For Goal Achievement: 12/29/23 Potential to Achieve Goals: Good    Frequency       Co-evaluation               AM-PAC PT "6 Clicks" Mobility  Outcome Measure Help needed turning from your back to your side while in a flat bed without using bedrails?: None Help needed moving from lying on your back to sitting on the side of a flat bed without using bedrails?: None Help needed moving to and from a bed to a chair (including a wheelchair)?: None Help needed standing up from a chair using your arms (e.g., wheelchair or bedside chair)?: None Help needed to walk in hospital room?: None Help needed climbing 3-5 steps with a railing? : None 6 Click Score: 24    End of Session Equipment Utilized During Treatment: Gait belt Activity Tolerance: Patient tolerated treatment well Patient left: in bed;with call bell/phone within reach;with family/visitor present (pt sitting EOB; nurse reported pt doesn't require active bed alarm) Nurse Communication:  Mobility status;Other (comment) (pt's ending position and d/c recommendations) PT Visit Diagnosis: Other (comment) (decrease cardiovascular endurance)    Time: 1308-6578 PT Time Calculation (min) (ACUTE ONLY): 20 min   Charges:   PT Evaluation $PT Eval Low Complexity: 1 Low   PT General Charges $$ ACUTE PT VISIT: 1 Visit        [address], SPT   [place] 12/15/2023, 9:06 AM

## 2023-12-15 NOTE — Progress Notes (Signed)
 OT Cancellation Note  Patient Details Name: Faith Gray MRN: [id number] DOB: 1941/09/16   Cancelled Treatment:    Reason Eval/Treat Not Completed: OT screened, no needs identified, will sign off Per PT, pt at baseline for ADLs/mobility. No formal OT eval needed  Lorre Munroe 12/15/2023, 8:42 AM

## 2023-12-15 NOTE — Progress Notes (Addendum)
 PHARMACY - ANTICOAGULATION CONSULT NOTE  Pharmacy Consult for Eliquis Indication:  Hx of LV aneurysm  Allergies  Allergen Reactions   Vicodin [Hydrocodone-Acetaminophen] Nausea And Vomiting    Patient Measurements: Height: 4\' 11"  (149.9 cm) Weight: 45.5 kg (100 lb 3.2 oz) IBW/kg (Calculated) : 43.2 HEPARIN DW (KG): 46.2  Vital Signs: Temp: 97.7 F (36.5 C) (03/29 0553) Temp Source: Oral (03/29 0553) BP: 123/88 (03/29 0553) Pulse Rate: 60 (03/29 0553)  Labs: Recent Labs    12/13/23 1129 12/13/23 1459 12/14/23 0426 12/14/23 0929 12/14/23 1034 12/15/23 0245  HGB 7.3*  --  6.7*  --   --  9.0*  HCT 22.3*  --  20.0*  --   --  26.0*  PLT 275  --  264  --   --  326  CREATININE 1.55*  --  1.56*  --   --  1.53*  TROPONINIHS 44* 60*  --  22* 20*  --     Estimated Creatinine Clearance: 19.7 mL/min (A) (by C-G formula based on SCr of 1.53 mg/dL (H)).   Medical History: Past Medical History:  Diagnosis Date   Acute systolic heart failure (HCC) 09/09/2012   Anemia    Arthritis    CAD (coronary artery disease) 09/11/2012   Chest pain 09/07/2012   CHF (congestive heart failure) (HCC)    Dental caries    Essential hypertension 10/04/2012   Ischemic cardiomyopathy    NSTEMI (non-ST elevated myocardial infarction) (HCC) 09/11/2012   Shortness of breath     Assessment: 82YOF with PMH of HTN, HLD, CKD3B, CAD, TIA, anemia, CHF, LV aneurysm on Eliquis PTA presented with shortness of breath. Patient was transitioning to Xarelto due to cost but had not picked up yet. Has been confirmed Xarelto and Eliquis are the same price ($47) and patient is willing to continue on Eliquis. Pharmacy consulted to dose/initiate Eliquis while inpatient.  PTA Eliquis dose: 2.5mg  PO BID  Hgb 9, improved Plt 326, WNL No signs/symptoms of bleeding    Plan:  Start Eliquis 2.5mg  PO BID  Monitor CBC and for signs and symptoms of bleeding  Stephenie Acres, PharmD PGY1 Pharmacy Resident 12/15/2023  7:34 AM

## 2023-12-16 LAB — HAPTOGLOBIN: Haptoglobin: 192 mg/dL (ref 41–333)

## 2024-01-08 ENCOUNTER — Telehealth (HOSPITAL_COMMUNITY): Payer: Self-pay | Admitting: Family Medicine

## 2024-01-08 NOTE — Progress Notes (Signed)
 ADVANCED HEART FAILURE CLINIC NOTE  Patient ID: Faith Gray, female   DOB: 24-Mar-1941, 83 y.o.   MRN: [id number]  PCP: Catherin Closs HF Cardiologist: Dr. Julane Ny  HPI: 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    Admitted to [facility] 12/13 for new onset acute systolic HF. Echo showed EF 25-30% with diffuse hypokinesis. Mild MR.   Admitted to [facility] 6/14 for near syncope after taking hydralazine  early and jumping up quickly. Work up negative. Echo showed EF improved to 40-45%, Severe hypokinesis base inferolateral. Akinesis base/mid inferior wall. 30 day Event monitor placed 03/11/13, showing no significant arrhythmias.  Last seen 03/2022.  Admitted 3/25 with a/c HF. Echo showed EF 40% with moderate to severe MR. Diuresed with IV lasix . Discharged home, weight 100 lbs.  Today she returns for post hospital HF follow up with granddaughter. Overall feeling fine. No SOB with activity. Denies palpitations, abnormal bleeding, CP, dizziness, edema, or PND/Orthopnea. Appetite ok. No fever or chills. Weight at home 104 pounds. Taking all medications. No ETOH, drugs or tobacco use. She does not drive.   Cardiac Studies  - Echo 3/25: EF 40%, moderate to severe MR - Echo 11/01/20: EF 30-35%  large aneurysm at the base to mid inferior wall. Mild to moderate MR.  - Echo 3/18: EF 30-35% large aneurysm at the base to mid inferior wall. - Echo 02/01/18: EF 35% large aneurysm at the base to mid inferior wall.  - Cath 08/2012:  Left main: Normal  LAD: Gives off 3 diagonals. Mild diffuse plaquing with 30-40% lesion in midsection. Otherwise normal.  LCX: Tiny ramus. Large OM-1 & OM-2. 30% lesion in ostial LCX. Otherwise mild plaque.  RCA: Dominant vessel with diffuse disease. Diffuse 30-40% through midsection. In distal RCA there is a ruptured plaque with  about 60% stenosis. PDA moderate diffuse disease. In distal RCA just prior to take-off of PLs there is a very focal area of myocardial bridging with severe systolic compression.  LV-gram done in the RAO projection: Ejection fraction = 25-30%. There is a large aneurysm at the base to mid inferior wall. Anterior wall mild HK. No significant MR noted.  Review of systems complete and found to be negative unless listed in HPI.    Past Medical History:  Diagnosis Date   Acute systolic heart failure (HCC) 09/09/2012   Anemia    Arthritis    CAD (coronary artery disease) 09/11/2012   Chest pain 09/07/2012   CHF (congestive heart failure) (HCC)    Dental caries    Essential hypertension 10/04/2012   Ischemic cardiomyopathy    NSTEMI (non-ST elevated myocardial infarction) (HCC) 09/11/2012   Shortness of breath    Current Outpatient Medications  Medication Sig Dispense Refill   apixaban  (ELIQUIS ) 2.5 MG TABS tablet Take 1 tablet (2.5 mg total) by mouth 2 (two) times daily. 60 tablet 3   atorvastatin  (LIPITOR ) 80 MG tablet TAKE 1 TABLET BY MOUTH DAILY AT 6PM 90 tablet 1   benzonatate  (TESSALON ) 100 MG capsule Take 1 capsule (100 mg total) by mouth 3 (three) times daily as needed for cough. 20 capsule 0   carvedilol  (COREG ) 25 MG tablet Take 25 mg by mouth 2 (two) times daily with a meal.     ENTRESTO  97-103 MG TAKE 1 TABLET TWICE DAILY  (DOSE  CHANGE) (Patient taking differently: Take  1 tablet by mouth 2 (two) times daily.) 180 tablet 3   ezetimibe  (ZETIA ) 10 MG tablet Take 1 tablet (10 mg total) by mouth daily. (Patient taking differently: Take 10 mg by mouth every evening.) 30 tablet 11   ferrous sulfate 325 (65 FE) MG EC tablet Take 325 mg by mouth daily after breakfast.     isosorbide  mononitrate (IMDUR ) 30 MG 24 hr tablet Take 30 mg by mouth every evening.     JARDIANCE  10 MG TABS tablet TAKE 1 TABLET EVERY DAY (Patient taking differently: Take 10 mg by mouth as needed.) 90 tablet 3    pregabalin  (LYRICA ) 25 MG capsule Take 25 mg by mouth as needed (nerve pain).     spironolactone  (ALDACTONE ) 25 MG tablet TAKE 1 TABLET EVERY DAY (NEED MD APPOINTMENT) (Patient taking differently: Take 25 mg by mouth every evening.) 90 tablet 3   torsemide  (DEMADEX ) 20 MG tablet Take 1 tablet (20 mg total) by mouth daily. 30 tablet 3   TYLENOL  500 MG tablet Take 500-1,000 mg by mouth every 6 (six) hours as needed for mild pain or headache.     No current facility-administered medications for this encounter.   Allergies  Allergen Reactions   Vicodin [Hydrocodone-Acetaminophen ] Nausea And Vomiting    Wt Readings from Last 3 Encounters:  01/09/24 45.5 kg (100 lb 6.4 oz)  12/15/23 45.5 kg (100 lb 3.2 oz)  01/16/23 48.4 kg (106 lb 12.8 oz)   BP 116/82   Pulse 74   Wt 45.5 kg (100 lb 6.4 oz)   SpO2 94%   BMI 20.28 kg/m   PHYSICAL EXAM: General:  NAD. No resp difficulty, walked into clinic, thin HEENT: Normal Neck: Supple. No JVD. Cor: Regular rate & rhythm. No rubs, gallops, 3/6 SEM Lungs: Clear Abdomen: Soft, nontender, nondistended.  Extremities: No cyanosis, clubbing, rash, edema Neuro: Alert & oriented x 3, moves all 4 extremities w/o difficulty. Affect pleasant.  ECG (personally reviewed): NSR 68, artifact  Assessment/Plan 1. Chronic systolic HF:  - Ischemic cardiomyopathy. 2019:  EF ~35%  She has a large inferior wall aneurysm.  - Echo 2/22: EF 30-35%  large aneurysm at the base to mid inferior wall. Mild to moderate MR.  - Echo 3/25 EF ~40% with moderate MR. - NYHA I-II, volume OK today. - Continue torsemide  20 mg daily. - Continue Entresto  97/103 - Continue Imdur  30 mg daily.  - Continue spiro 25 mg daily - Continue carvedilol  25 mg bid - Continue Jardiance  10 mg daily. No GU symptoms. - Labs reviewed from PCP visit 12/27/23 and are stable, K 4.8, SCr 1.67 - Repeat echo next visit.   2. MR - mild to mod on echo 2/22 - moderate to severe on echo 3/25 - suspect  that this is the driver of her symptoms, as the EF remains unchanged - Consider MitraClip if fails medical therapy. - Repeat echo next visit   3. CAD  - No chest pain - No ASA with AC - Continue atorvastatin  + ? blocker + zetia    4. Anemia - baseline hgb 7-8, likely related to CKD - CBC reviewed from PCP visit, Hgb stable at 9.4   5. CKD 3a - baseline SCr 1.5.  - Continue SGLT2i - Last SCr 1.67  6. LV aneurysm:   - Felt to be high-risk for embolic phenomenon  - Previously on coumadin , now on Eliquis  2.5 mg bid (reduced dose for age and weight) - No bleeding.    7. HTN -  Stable.  - Meds as above.  8. MGUS - Followed by Dr Meredeth Stallion.    Follow up in 2-3 months with Dr. Julane Ny + echo.  Vernia Good, FNP-BC 01/09/24

## 2024-01-08 NOTE — Telephone Encounter (Signed)
 Called to confirm/remind patient of their appointment at the [facility] on 01/08/24 .   Appointment:   [x] Confirmed  [] Left mess   [] No answer/No voice mail  [] VM Full/unable to leave message  [] Phone not in service  Patient reminded to bring all medications and/or complete list.  Confirmed patient has transportation. Gave directions, instructed to utilize valet parking.

## 2024-01-09 ENCOUNTER — Telehealth (HOSPITAL_COMMUNITY): Payer: Self-pay | Admitting: Pharmacy Technician

## 2024-01-09 ENCOUNTER — Encounter (HOSPITAL_COMMUNITY): Payer: Self-pay

## 2024-01-09 ENCOUNTER — Other Ambulatory Visit (HOSPITAL_COMMUNITY): Payer: Self-pay

## 2024-01-09 ENCOUNTER — Ambulatory Visit (HOSPITAL_COMMUNITY)
Admission: RE | Admit: 2024-01-09 | Discharge: 2024-01-09 | Disposition: A | Discharge status: Home / Self Care | Source: Ambulatory Visit | Attending: Family Medicine | Admitting: Family Medicine

## 2024-01-09 VITALS — BP 116/82 | HR 74 | Wt 100.4 lb

## 2024-01-09 DIAGNOSIS — Z87891 Personal history of nicotine dependence: Secondary | ICD-10-CM | POA: Insufficient documentation

## 2024-01-09 DIAGNOSIS — I255 Ischemic cardiomyopathy: Secondary | ICD-10-CM | POA: Diagnosis present

## 2024-01-09 DIAGNOSIS — I130 Hypertensive heart and chronic kidney disease with heart failure and stage 1 through stage 4 chronic kidney disease, or unspecified chronic kidney disease: Secondary | ICD-10-CM | POA: Insufficient documentation

## 2024-01-09 DIAGNOSIS — D631 Anemia in chronic kidney disease: Secondary | ICD-10-CM | POA: Insufficient documentation

## 2024-01-09 DIAGNOSIS — I2510 Atherosclerotic heart disease of native coronary artery without angina pectoris: Secondary | ICD-10-CM | POA: Insufficient documentation

## 2024-01-09 DIAGNOSIS — I340 Nonrheumatic mitral (valve) insufficiency: Secondary | ICD-10-CM | POA: Insufficient documentation

## 2024-01-09 DIAGNOSIS — D472 Monoclonal gammopathy: Secondary | ICD-10-CM | POA: Insufficient documentation

## 2024-01-09 DIAGNOSIS — I10 Essential (primary) hypertension: Secondary | ICD-10-CM

## 2024-01-09 DIAGNOSIS — I253 Aneurysm of heart: Secondary | ICD-10-CM | POA: Diagnosis not present

## 2024-01-09 DIAGNOSIS — N1831 Chronic kidney disease, stage 3a: Secondary | ICD-10-CM | POA: Diagnosis not present

## 2024-01-09 DIAGNOSIS — I5022 Chronic systolic (congestive) heart failure: Secondary | ICD-10-CM | POA: Diagnosis present

## 2024-01-09 DIAGNOSIS — D509 Iron deficiency anemia, unspecified: Secondary | ICD-10-CM

## 2024-01-09 DIAGNOSIS — N183 Chronic kidney disease, stage 3 unspecified: Secondary | ICD-10-CM

## 2024-01-09 DIAGNOSIS — Z7901 Long term (current) use of anticoagulants: Secondary | ICD-10-CM | POA: Insufficient documentation

## 2024-01-09 MED ORDER — ENTRESTO 97-103 MG PO TABS: 1.0000 | ORAL_TABLET | Freq: Two times a day (BID) | ORAL | 3 refills | Status: DC

## 2024-01-09 MED ORDER — EMPAGLIFLOZIN 10 MG PO TABS: 10.0000 mg | ORAL_TABLET | Freq: Every day | ORAL | 3 refills | Status: AC

## 2024-01-09 NOTE — Addendum Note (Signed)
 Encounter addended by: Nida Barrow, RN on: 01/09/2024 3:20 PM  Actions taken: Diagnosis association updated, Order list changed, Clinical Note Signed, Flowsheet accepted

## 2024-01-09 NOTE — Telephone Encounter (Signed)
 Advanced Heart Failure Patient Advocate Encounter  The patient was approved for a [employer] grant that will help cover the cost of Entresto , Jardiance , Coreg  and Spironolactone . Total amount awarded, $10,000. Eligibility, 12/10/23 - 12/08/24.  ID [id number]  BIN [id number]  PCN [id number]  Group [id number]  Patient was given copy while in office. Insurance verification completed.   The patient is insured through [employer]   Ran test claim for Eliquis . Currently a quantity of 60 is a 30 day supply and the co-pay is $297. Suggested that the patient use the [employer] grant to fill 90 days of the above mediations, then fill Eliquis . The deductible will be met, bringing the co-pay down to $141 for 90 days or $47 for 30.  This test claim was processed through [facility]- copay amounts may vary at other pharmacies due to pharmacy/plan contracts, or as the patient moves through the different stages of their insurance plan.   Correne Dillon, CPhT

## 2024-01-09 NOTE — Progress Notes (Signed)
 Medication Samples have been provided to the patient.  Drug name: Eliquis        Strength: 2.5 mg        Qty: 56  LOT: [id number]  Exp.Date: 09/2024  Dosing instructions: Patient takes 1 tablet by mouth twice a day.  The patient has been instructed regarding the correct time, dose, and frequency of taking this medication, including desired effects and most common side effects.   Annabell Baron Nevah Dalal 3:11 PM 01/09/2024

## 2024-01-09 NOTE — Patient Instructions (Addendum)
 Thank you for coming in today  If you had labs drawn today, any labs that are abnormal the clinic will call you No news is good news  Medications: You were given samples for Eliquis  and received a grant for medication assistance   Follow up appointments:  Your physician recommends that you schedule a follow-up appointment in:  3 months With Dr. Mclean with echocardiogram  Please call our office to schedule the follow-up appointment in May for July 2025 appointment .   Your physician has requested that you have an echocardiogram. Echocardiography is a painless test that uses sound waves to create images of your heart. It provides your doctor with information about the size and shape of your heart and how well your heart's chambers and valves are working. This procedure takes approximately one hour. There are no restrictions for this procedure.      Do the following things EVERYDAY: Weigh yourself in the morning before breakfast. Write it down and keep it in a log. Take your medicines as prescribed Eat low salt foods--Limit salt (sodium) to 2000 mg per day.  Stay as active as you can everyday Limit all fluids for the day to less than 2 liters   At the [facility], you and your health needs are our priority. As part of our continuing mission to provide you with exceptional heart care, we have created designated Provider Care Teams. These Care Teams include your primary Cardiologist (physician) and Advanced Practice Providers (APPs- Physician Assistants and Nurse Practitioners) who all work together to provide you with the care you need, when you need it.   You may see any of the following providers on your designated Care Team at your next follow up: Dr Jules Oar Dr Peder Bourdon Dr. Mimi Alt, NP Ruddy Corral, [facility], [place] Dennise Fitz, NP Luster Salters, PharmD   Please be sure to bring in all your  medications bottles to every appointment.    Thank you for choosing [facility]  If you have any questions or concerns before your next appointment please send us  a message through [place] or call our office at [phone].    TO LEAVE A MESSAGE FOR THE NURSE SELECT OPTION 2, PLEASE LEAVE A MESSAGE INCLUDING: YOUR NAME DATE OF BIRTH CALL BACK NUMBER REASON FOR CALL**this is important as we prioritize the call backs  YOU WILL RECEIVE A CALL BACK THE SAME DAY AS LONG AS YOU CALL BEFORE 4:00 PM

## 2024-01-22 NOTE — Progress Notes (Signed)
 Hematology/Oncology Evaluation  Patient Name:  Faith Gray Date of Birth:  1941/01/30 Date of Encounter:  01/22/2024  Referring Provider:  No ref. provider found,    PCP:  Elsie Fairy Favorite, PA-C  Diagnosis/Chief complaint/Reason for visit 83 year old female with normochromic normocytic anemia. Here for further evaluation   Problem list: Patient Active Problem List  Diagnosis  . Aneurysm of left ventricle of heart  . CAD (coronary artery disease)  . Hypertensive heart disease with heart failure (HCC)  . Normochromic anemia  . History of non-ST elevation myocardial infarction (NSTEMI)  . Chronic kidney disease, stage 3b (CMD)  . Hyperlipidemia  . Chronic combined systolic and diastolic congestive heart failure    (CMD)  . History of TIA (transient ischemic attack)    Past Oncologic Therapy Oncology History   No history exists.     History of Present Illness:    Faith Gray is a  83 y.o. female who is seen in consultation at the request of  Favorite Elsie Fairy, * for an evaluation of.  Patient is a pleasant 83 year old female with multiple medical problems including history of hypertension, chronic systolic heart failure artery disease.   Patient was seen by her PCP in January 2023.  She had a CBC performed as part of her follow-up.  CBC at that time showed white count 4.4 but hemoglobin was 8.8 hematocrit 27.3, platelets were normal.  Because of the worsening anemia patient was referred  to hematology.  She has her baseline shortness of breath.  Otherwise no fevers chills night sweats headache.  She denies any bleeding bruising.  No hematuria.  No or chest pains.   Current therapy: Active surveillance   Interim Note: Faith Gray returns today for follow up of anemia. She is feeling well today and without complaints.  No fevers, chills, headaches, vision changes, rash, chest pain, edema, dyspnea, cough, abdominal pain, or new bony  pain.   Allergies: Hydrocodone-acetaminophen   Medications: Current Outpatient Medications  Medication Sig Dispense Refill  . apixaban  (ELIQUIS ) 2.5 mg tab Take 2.5 mg by mouth 2 (two) times a day.    . atorvastatin  (LIPITOR ) 80 mg tablet Take 80 mg by mouth Once Daily.    . carvediloL  (COREG ) 25 mg tablet Take 0.5 tablets (12.5 mg total) by mouth in the morning and 0.5 tablets (12.5 mg total) in the evening. Take with meals.    . Entresto  97-103 mg per tablet 2 tablets Once Daily.    . ezetimibe  (ZETIA ) 10 mg tablet TAKE 1 TABLET EVERY DAY 90 tablet 3  . isosorbide  mononitrate (IMDUR ) 30 mg 24 hr tablet TAKE 1 TABLET EVERY DAY 90 tablet 3  . Jardiance  10 mg tab TAKE 1 TABLET EVERY DAY 90 tablet 3  . pregabalin  (LYRICA ) 25 mg capsule Take 1 capsule (25 mg total) by mouth 2 (two) times a day. 60 capsule 1  . spironolactone  (ALDACTONE ) 25 mg tablet Take 25 mg by mouth Once Daily. 90 tablet 0  . torsemide  (DEMADEX ) 5 mg tablet Take 1 tablet (5 mg total) by mouth daily. 90 tablet 3   No current facility-administered medications for this visit.    Scheduled Meds:    Past Medical History: Past Medical History:  Diagnosis Date  . Diplopia 01/08/2023  . Heart disease   . Hypertension   . TIA (transient ischemic attack) 01/08/2023    Past Surgical History: History reviewed. No pertinent surgical history.   Personal and Social History: Social History  Socioeconomic History  . Marital status: Single    Spouse name: None  . Number of children: 2  . Years of education: None  . Highest education level: None  Occupational History  . Occupation: retired  Tobacco Use  . Smoking status: Former  . Smokeless tobacco: Never  Vaping Use  . Vaping status: Never Used  Substance and Sexual Activity  . Alcohol use: Not Currently  . Drug use: Never  . Sexual activity: Not Currently  Other Topics Concern  . None  Social History Narrative  . None   Social Drivers of Health   Food  Insecurity: No Food Insecurity (12/13/2023)   Received from [facility]   Food vital sign   . Within the past 12 months, you worried that your food would run out before you got money to buy more: Never true   . Within the past 12 months, the food you bought just didn't last and you didn't have money to get more: Never true  Transportation Needs: No Transportation Needs (12/13/2023)   Received from [facility] - Transportation   . Lack of Transportation (Medical): No   . Lack of Transportation (Non-Medical): No  Safety: Low Risk  (01/22/2024)   Safety   . How often does anyone, including family and friends, physically hurt you?: Never   . How often does anyone, including family and friends, insult or talk down to you?: Never   . How often does anyone, including family and friends, threaten you with harm?: Never   . How often does anyone, including family and friends, scream or curse at you?: Never  Living Situation: Low Risk  (12/13/2023)   Received from [facility] Situation   . In the last 12 months, was there a time when you were not able to pay the mortgage or rent on time?: No   . In the past 12 months, how many times have you moved where you were living?: 0   . At any time in the past 12 months, were you homeless or living in a shelter (including now)?: No    Family History: Cancer-related family history is negative for Breast cancer. She indicated that the status of her mother is unknown. She indicated that the status of her neg hx is unknown.    Physical Examination: Vital Signs: BP 134/81   Pulse 76   Temp 97.8 F (36.6 C)   Resp 16   Wt 46.4 kg (102 lb 4.8 oz)   SpO2 96%   BMI 20.66 kg/m  General:  Healthy-appearing female in no acute distress. HEENT: Normocephalic, atraumatic. PERRLA, EOMI, sclera anicteric.  Nose without discharge.  Mouth and lips showed no lesions; oral mucosa is moist.  Neck supple without masses. Lymphatic/Immunologic:  No  cervical, axillary, or femoral adenopathy.  Cardiovascular:  RRR without significant murmurs, gallops, or rubs.  Heart not clinically enlarged.  No lower extremity edema.   Respiratory:  Chest clear to percussion and auscultation bilaterally; no respiratory distress. Patient speaks in complete sentences. Gastrointestinal:  Abdomen soft and without tenderness; no hepatosplenomegaly or other masses. No clinical ascites. Musculoskeletal:  No bony pain or tenderness. No tenosynovitis or joint effusions noted. Extremities:  No edema or suspicious rashes.  No cyanosis or clubbing. Skin:  No pathologic appearing petechiae or bruising noted. Neurologic: Alert and oriented to person, place, time and circumstance.  Strength and sensation are grossly intact.  Cranial nerves III through XII grossly intact.  Psychiatric:    Mood and affect are normal. Speech is fluent.  Assessment and plan:83 y.o. female with     1.  MGUS, IgG M lambda M spike in the beta-2 region estimated at 0.7G/DL.  She had a skeletal survey performed that showed no definite loosening lesions of multiple myeloma identified  within the axial or appendicular skeleton or bowel.  Patient was recommended bone marrow biopsy and asked for it as well as a 24-hour urine protein electrophoresis with immunofixation unfortunately these were not performed.  We will plan on having them  rescheduled.  CBC shows wbc 4.1, improved from prior visit.Hemoglobin 8.3, iron  saturation 10%, other iron  studies are normal. Patient asymptomatic    2.  Follow-up: 6 months with lab and provider visit    A total of 30 minutes was spent in patient care today.  Greater than 50% of my time was spent face-to-face and nonface-to-face with the patient, reviewing her medical records, completing a physical examination, going over the results of the radiology  reports, laboratory results, treatment of anemia, possible MDS, completing treatment plan and answering the patient and  family's questions. Patient is encouraged to call with any questions or concerns if they should arise prior to next scheduled office  visit.      This record has been created using Conservation officer, historic buildings.

## 2024-02-19 NOTE — Progress Notes (Signed)
 [address] - AMBULATORY [facility]  - FAMILY MEDICINE [facility] [address] [place] [address]    Date of Service: 02/19/2024 Patient Name: Faith Gray Patient DOB: May 07, 1941    Assessment and Plan:  Faith Gray was seen today for hypertension.  Diagnoses and all orders for this visit:  Hypertensive heart disease with heart failure (HCC) Blood pressure is good.  No issues with medications.  No issues with low blood pressures recently.  We will continue to monitor and call if has any problems.  Chronic combined systolic and diastolic congestive heart failure    (CMD) Compensated today doing very well.  Follow-up with cardiology as scheduled.  Other hyperlipidemia No change in regimen today will continue to monitor and adjust medications as appropriate.   She verbalizes understanding and agreement of her current diagnoses, medications and therapies. All questions answered.   Medication side effects discussed with patient. Advised patient to call clinic or return for visit if these symptoms occur. The patient does not  have any barriers to taking medications as prescribed. Goals of care discussed with patient including medication adherence and adequate follow up Relevant barriers identified and addressed: none.   Results for orders placed or performed in visit on 01/22/24  Ferritin   Collection Time: 01/22/24  2:18 PM  Result Value Ref Range   Ferritin 122 11 - 307 ng/mL  Iron  And Total Iron  Binding Capacity   Collection Time: 01/22/24  2:18 PM  Result Value Ref Range   Iron  84 50 - 212 ug/dL   Transferrin 801 (L) 796 - 362 mg/dL   Total Iron  Binding Capacity (TIBC) 283 (L) 290 - 518 ug/dL   Transferrin Saturation 30 15 - 45 %  Beta-2 Microglobulin   Collection Time: 01/22/24  2:18 PM  Result Value Ref Range   Beta-2 Microglobulin 6.0 (H) 0.8 - 2.3 mg/L  Free Kappa And Lambda Light Chains with Ratio, Quantitative    Collection Time: 01/22/24  2:18 PM  Result Value Ref Range   Free Kappa Light Chains, Serum 54.47 (H) 3.30 - 19.40 mg/L   Free Lambda Light Chains, Serum 51.91 (H) 5.71 - 26.30 mg/L   Kappa/Lambda Ratio, Serum 1.05 0.26 - 1.65  Immunoelectrophoresis (IFE), Serum   Collection Time: 01/22/24  2:18 PM  Result Value Ref Range   Immunoelectrophoresis (IFE), Serum MONOCLONAL IGM LAMBDA.    Pathology Review Serum Immunoelectrophoresis Electronically signed by Suzen Sheldon.    Immunoglobulins, Quantitative, IgA, IgG, IgM   Collection Time: 01/22/24  2:18 PM  Result Value Ref Range   Immunoglobulin A, Quantitative (IgA) 244 66 - 433 mg/dL   Immunoglobulin G, Quantitative (IgG) 1,370 635 - 1,741 mg/dL   Immunoglobulin M, Quantitative (IgM) 768 (H) 45 - 281 mg/dL  Protein Electrophoresis, Serum   Collection Time: 01/22/24  2:18 PM  Result Value Ref Range   Total Protein 7.3 6.4 - 8.9 g/dL   Electrophoresis, Albumin 3.8 3.5 - 5.0 g/dL   Joeyj-8-Honalopw 0.4 0.1 - 0.4 g/dL   Joeyj-7-Honalopw 0.7 0.4 - 1.2 g/dL   Azuj-8-Honalopw 0.4 0.4 - 0.7 g/dL   Azuj-7-Honalopw 1.0 (H) 0.1 - 0.4 g/dL   Gamma Globulin 1.1 0.5 - 1.6 g/dL   Protein Electrophoresis Interpretation      M SPIKE IN THE BETA TWO REGION, ESTIMATED AT 0.56 G/DL.   Pathology Review Serum Electrophoresis Electronically signed by Ozell Darryle Sarah.    CBC with Differential   Collection Time:  01/22/24  2:18 PM  Result Value Ref Range   WBC 4.16 (L) 4.40 - 11.00 10*3/uL   RBC 3.38 (L) 4.10 - 5.10 10*6/uL   Hemoglobin 9.4 (L) 12.3 - 15.3 g/dL   Hematocrit 71.1 (L) 64.0 - 44.6 %   Mean Corpuscular Volume (MCV) 85.3 80.0 - 96.0 fL   Mean Corpuscular Hemoglobin (MCH) 27.8 27.5 - 33.2 pg   Mean Corpuscular Hemoglobin Conc (MCHC) 32.5 (L) 33.0 - 37.0 g/dL   Red Cell Distribution Width (RDW) 14.6 12.3 - 17.0 %   Platelet Count (PLT) 306 150 - 450 10*3/uL   Mean Platelet Volume (MPV) 7.4 6.8 - 10.2 fL   Neutrophils % 51 %    Lymphocytes % 37 %   Monocytes % 8 %   Eosinophils % 3 %   Basophils % 1 %   Neutrophils Absolute 2.10 1.80 - 7.80 10*3/uL   Lymphocytes # 1.50 1.00 - 4.80 10*3/uL   Monocytes # 0.30 0.00 - 0.80 10*3/uL   Eosinophils # 0.10 0.00 - 0.50 10*3/uL   Basophils # 0.00 0.00 - 0.20 10*3/uL  Vitamin B12   Collection Time: 01/22/24  2:18 PM  Result Value Ref Range   Vitamin B-12 767 180 - 914 pg/mL  Folate   Collection Time: 01/22/24  2:18 PM  Result Value Ref Range   Folate 17.2 >5.8 ng/mL  Erythropoietin (EPO)   Collection Time: 01/22/24  2:18 PM  Result Value Ref Range   Erythropoietin 20.8 4.0 - 27.0 mUnits/mL   Return for Keep current appt. Electronically signed by: Elsie Fairy Favorite, PA-C 02/21/2024 2:30 PM   HPI:   Faith Gray is a 83 y.o. female presents with Hypertension (Taking med as prescribed, checking BP at home with good readings.  Pt advises she is supposed to get a UA today although is not having any sx) Patient presents to the office today for routine visit of their multiple medical problems.  Sitting in the office today in no acute distress talking in complete sentences.  Feels great today overall.  No new concerns.  Heart failure is compensated and doing well on current regimen.  Follows up with cardiology as scheduled.  BP is good in the office.  Denies chest pain, SOB, HA, palpitations, or vision changes.    ROS:  Constitutional symptoms: negative Eyes:  negative Ear, nose, throat:  negative Cardiovascular:  negative Respiratory:  negative Gastrointestinal:  negative Genitourinary:  negative Skin:  negative Neurological:  negative Musculoskeletal:  negative Psychiatric:  negative Endocrine:  negative Hematological:  negative Allergic:  negative  Objective:   Vitals:   02/19/24 1437  BP: 126/84  Pulse: 59  Resp: 16  Temp: 98.4 F (36.9 C)  TempSrc: Temporal  Weight: 45.2 kg (99 lb 9.6 oz)  Height: 1.499 m (4' 11)    Constitutional:  Well-developed and well-nourished. Sitting comfortably conversing normally. Pleasant.  Cardiovascular: +S1S2, regular rate and rhythm, 4/6 murmur no gallops or rubs appreciated.  Respiratory: Normal effort, clear to auscultation bilaterally. No wheezes, rales or rhonchi noted.  Psychiatric: Behavior is Cooperative and Polite. Mood euthymyic. Affect is appropriate.  Allergies Hydrocodone-acetaminophen   Current Outpatient Medications  Medication Sig Dispense Refill  . apixaban  (ELIQUIS ) 2.5 mg tab Take 2.5 mg by mouth 2 (two) times a day.    . atorvastatin  (LIPITOR ) 80 mg tablet Take 80 mg by mouth Once Daily.    . carvediloL  (COREG ) 25 mg tablet Take 0.5 tablets (12.5 mg total) by mouth in the morning and  0.5 tablets (12.5 mg total) in the evening. Take with meals.    . Entresto  97-103 mg per tablet 2 tablets Once Daily.    . ezetimibe  (ZETIA ) 10 mg tablet TAKE 1 TABLET EVERY DAY 90 tablet 3  . isosorbide  mononitrate (IMDUR ) 30 mg 24 hr tablet TAKE 1 TABLET EVERY DAY 90 tablet 3  . Jardiance  10 mg tab TAKE 1 TABLET EVERY DAY 90 tablet 3  . pregabalin  (LYRICA ) 25 mg capsule Take 1 capsule (25 mg total) by mouth 2 (two) times a day. 60 capsule 1  . spironolactone  (ALDACTONE ) 25 mg tablet Take 25 mg by mouth Once Daily. 90 tablet 0  . torsemide  (DEMADEX ) 5 mg tablet Take 1 tablet (5 mg total) by mouth daily. 90 tablet 3   No current facility-administered medications for this visit.   Patient Active Problem List  Diagnosis  . Aneurysm of left ventricle of heart  . CAD (coronary artery disease)  . Hypertensive heart disease with heart failure (HCC)  . Normochromic anemia  . History of non-ST elevation myocardial infarction (NSTEMI)  . Chronic kidney disease, stage 3b (CMD)  . Hyperlipidemia  . Chronic combined systolic and diastolic congestive heart failure    (CMD)  . History of TIA (transient ischemic attack)   History reviewed. No pertinent surgical history. Family History  Problem  Relation Name Age of Onset  . Stroke Mother    . Heart disease Mother    . Breast cancer Neg Hx     Social History   Socioeconomic History  . Marital status: Single    Spouse name: None  . Number of children: 2  . Years of education: None  . Highest education level: None  Occupational History  . Occupation: retired  Tobacco Use  . Smoking status: Former  . Smokeless tobacco: Never  Vaping Use  . Vaping status: Never Used  Substance and Sexual Activity  . Alcohol use: Not Currently  . Drug use: Never  . Sexual activity: Not Currently  Other Topics Concern  . None  Social History Narrative  . None   Social Drivers of Health   Food Insecurity: No Food Insecurity (12/13/2023)   Received from [facility]   Food vital sign   . Within the past 12 months, you worried that your food would run out before you got money to buy more: Never true   . Within the past 12 months, the food you bought just didn't last and you didn't have money to get more: Never true  Transportation Needs: No Transportation Needs (12/13/2023)   Received from [facility] - Transportation   . Lack of Transportation (Medical): No   . Lack of Transportation (Non-Medical): No  Safety: Low Risk  (01/22/2024)   Safety   . How often does anyone, including family and friends, physically hurt you?: Never   . How often does anyone, including family and friends, insult or talk down to you?: Never   . How often does anyone, including family and friends, threaten you with harm?: Never   . How often does anyone, including family and friends, scream or curse at you?: Never  Living Situation: Low Risk  (12/13/2023)   Received from [facility] Situation   . In the last 12 months, was there a time when you were not able to pay the mortgage or rent on time?: No   . In the past 12 months, how many times have you moved  where you were living?: 0   . At any time in the past 12 months, were you homeless or  living in a shelter (including now)?: No   Social History   Tobacco Use  Smoking Status Former  Smokeless Tobacco Never     I have reviewed and (if needed) updated the patient's problem list, medications, allergies, past medical and surgical history, social and family history. Health Maintenance Status       Date Due Completion Dates   ZOSTER VACCINE (1 of 2) Never done ---   Influenza Vaccine (Season Ended) 04/18/2024 ---   Adult RSV (60+ Years or Pregnancy) (1 - 1-dose 75+ series) 03/15/2024 (Originally 12/31/2016) ---   Pneumococcal Vaccine for Ages 50+ (1 of 2 - PCV) 03/15/2024 (Originally 12/31/1960) ---   DTaP/Tdap/Td Vaccines (1 - Tdap) 03/15/2024 (Originally 12/31/1960) ---   Bone Density Scan 03/15/2024 (Originally Feb 13, 1941) ---   COVID-19 Vaccine (1 - 2024-25 season) 07/23/2024 (Originally 05/20/2023) ---   Comprehensive Annual Visit 07/23/2024 07/24/2023, 07/24/2023   Medicare Annual Wellness (AWV) Subsequent Visits 07/23/2024 07/24/2023, 07/24/2023   Diabetes:  eGFR for Kidney Evaluation 12/26/2024 12/27/2023, 07/24/2023   Depression Screening 01/21/2025 01/22/2024       This document serves as a record of services personally performed by Elsie Favorite PA-C,  It was created on their behalf by Katheryn Gentry, RMA, a trained medical scribe, and Certified Medical Assistant (CMA). During the course of documenting the history, physical exam and medical decision making, I was functioning as a Stage manager. The creation of this record is the provider's dictation and/or activities during the visit.  Electronically signed by Katheryn Gentry, RMA 02/19/2024 2:37 PM     I, Elsie Favorite, PA-C, have reviewed the scribe's documentation, personally examined the patient, added my documentation and attest it is accurate and complete.

## 2024-06-05 ENCOUNTER — Observation Stay (HOSPITAL_COMMUNITY)
Admission: EM | Admit: 2024-06-05 | Discharge: 2024-06-09 | Disposition: A | Discharge status: Home / Self Care | Attending: Internal Medicine | Admitting: Internal Medicine

## 2024-06-05 ENCOUNTER — Ambulatory Visit (HOSPITAL_COMMUNITY): Payer: Self-pay

## 2024-06-05 ENCOUNTER — Ambulatory Visit
Admission: EM | Admit: 2024-06-05 | Discharge: 2024-06-05 | Disposition: A | Discharge status: Home / Self Care | Source: Ambulatory Visit | Attending: Family Medicine | Admitting: Family Medicine

## 2024-06-05 ENCOUNTER — Other Ambulatory Visit: Payer: Self-pay

## 2024-06-05 ENCOUNTER — Encounter: Payer: Self-pay | Admitting: Emergency Medicine

## 2024-06-05 ENCOUNTER — Emergency Department (HOSPITAL_COMMUNITY)

## 2024-06-05 ENCOUNTER — Ambulatory Visit (INDEPENDENT_AMBULATORY_CARE_PROVIDER_SITE_OTHER)

## 2024-06-05 DIAGNOSIS — Z7901 Long term (current) use of anticoagulants: Secondary | ICD-10-CM

## 2024-06-05 DIAGNOSIS — D472 Monoclonal gammopathy: Secondary | ICD-10-CM | POA: Diagnosis not present

## 2024-06-05 DIAGNOSIS — N1832 Chronic kidney disease, stage 3b: Secondary | ICD-10-CM | POA: Diagnosis present

## 2024-06-05 DIAGNOSIS — M7989 Other specified soft tissue disorders: Secondary | ICD-10-CM | POA: Diagnosis not present

## 2024-06-05 DIAGNOSIS — I253 Aneurysm of heart: Secondary | ICD-10-CM | POA: Diagnosis not present

## 2024-06-05 DIAGNOSIS — I130 Hypertensive heart and chronic kidney disease with heart failure and stage 1 through stage 4 chronic kidney disease, or unspecified chronic kidney disease: Principal | ICD-10-CM | POA: Insufficient documentation

## 2024-06-05 DIAGNOSIS — I509 Heart failure, unspecified: Secondary | ICD-10-CM

## 2024-06-05 DIAGNOSIS — Z79899 Other long term (current) drug therapy: Secondary | ICD-10-CM | POA: Diagnosis not present

## 2024-06-05 DIAGNOSIS — E875 Hyperkalemia: Secondary | ICD-10-CM | POA: Insufficient documentation

## 2024-06-05 DIAGNOSIS — D649 Anemia, unspecified: Secondary | ICD-10-CM | POA: Diagnosis not present

## 2024-06-05 DIAGNOSIS — J069 Acute upper respiratory infection, unspecified: Secondary | ICD-10-CM | POA: Diagnosis not present

## 2024-06-05 DIAGNOSIS — Z1152 Encounter for screening for COVID-19: Secondary | ICD-10-CM | POA: Diagnosis not present

## 2024-06-05 DIAGNOSIS — I5042 Chronic combined systolic (congestive) and diastolic (congestive) heart failure: Principal | ICD-10-CM | POA: Diagnosis present

## 2024-06-05 DIAGNOSIS — R531 Weakness: Secondary | ICD-10-CM | POA: Diagnosis not present

## 2024-06-05 DIAGNOSIS — I340 Nonrheumatic mitral (valve) insufficiency: Secondary | ICD-10-CM | POA: Diagnosis not present

## 2024-06-05 DIAGNOSIS — R0602 Shortness of breath: Secondary | ICD-10-CM | POA: Diagnosis present

## 2024-06-05 DIAGNOSIS — I5043 Acute on chronic combined systolic (congestive) and diastolic (congestive) heart failure: Secondary | ICD-10-CM | POA: Insufficient documentation

## 2024-06-05 DIAGNOSIS — Z87891 Personal history of nicotine dependence: Secondary | ICD-10-CM | POA: Diagnosis not present

## 2024-06-05 DIAGNOSIS — I10 Essential (primary) hypertension: Secondary | ICD-10-CM | POA: Diagnosis not present

## 2024-06-05 DIAGNOSIS — R059 Cough, unspecified: Secondary | ICD-10-CM | POA: Diagnosis present

## 2024-06-05 DIAGNOSIS — I5023 Acute on chronic systolic (congestive) heart failure: Principal | ICD-10-CM

## 2024-06-05 LAB — I-STAT CHEM 8, ED
BUN: 28 mg/dL — ABNORMAL HIGH (ref 8–23)
Calcium, Ion: 1.09 mmol/L — ABNORMAL LOW (ref 1.15–1.40)
Chloride: 112 mmol/L — ABNORMAL HIGH (ref 98–111)
Creatinine, Ser: 1.6 mg/dL — ABNORMAL HIGH (ref 0.44–1.00)
Glucose, Bld: 77 mg/dL (ref 70–99)
HCT: 25 % — ABNORMAL LOW (ref 36.0–46.0)
Hemoglobin: 8.5 g/dL — ABNORMAL LOW (ref 12.0–15.0)
Potassium: 5.3 mmol/L — ABNORMAL HIGH (ref 3.5–5.1)
Sodium: 140 mmol/L (ref 135–145)
TCO2: 20 mmol/L — ABNORMAL LOW (ref 22–32)

## 2024-06-05 LAB — CBC WITH DIFFERENTIAL/PLATELET
Abs Immature Granulocytes: 0.01 K/uL (ref 0.00–0.07)
Basophils Absolute: 0 K/uL (ref 0.0–0.1)
Basophils Relative: 0 %
Eosinophils Absolute: 0.1 K/uL (ref 0.0–0.5)
Eosinophils Relative: 1 %
HCT: 25.3 % — ABNORMAL LOW (ref 36.0–46.0)
Hemoglobin: 8 g/dL — ABNORMAL LOW (ref 12.0–15.0)
Immature Granulocytes: 0 %
Lymphocytes Relative: 26 %
Lymphs Abs: 1.4 K/uL (ref 0.7–4.0)
MCH: 27.4 pg (ref 26.0–34.0)
MCHC: 31.6 g/dL (ref 30.0–36.0)
MCV: 86.6 fL (ref 80.0–100.0)
Monocytes Absolute: 0.3 K/uL (ref 0.1–1.0)
Monocytes Relative: 5 %
Neutro Abs: 3.8 K/uL (ref 1.7–7.7)
Neutrophils Relative %: 68 %
Platelets: 312 K/uL (ref 150–400)
RBC: 2.92 MIL/uL — ABNORMAL LOW (ref 3.87–5.11)
RDW: 13.9 % (ref 11.5–15.5)
WBC: 5.6 K/uL (ref 4.0–10.5)
nRBC: 0 % (ref 0.0–0.2)

## 2024-06-05 LAB — POC COVID19/FLU A&B COMBO
Covid Antigen, POC: NEGATIVE
Influenza A Antigen, POC: NEGATIVE
Influenza B Antigen, POC: NEGATIVE

## 2024-06-05 LAB — BASIC METABOLIC PANEL WITH GFR
Anion gap: 16 — ABNORMAL HIGH (ref 5–15)
BUN: 22 mg/dL (ref 8–23)
CO2: 18 mmol/L — ABNORMAL LOW (ref 22–32)
Calcium: 9.3 mg/dL (ref 8.9–10.3)
Chloride: 106 mmol/L (ref 98–111)
Creatinine, Ser: 1.41 mg/dL — ABNORMAL HIGH (ref 0.44–1.00)
GFR, Estimated: 37 mL/min — ABNORMAL LOW (ref >60)
Glucose, Bld: 78 mg/dL (ref 70–99)
Potassium: 4.7 mmol/L (ref 3.5–5.1)
Sodium: 140 mmol/L (ref 135–145)

## 2024-06-05 LAB — RESP PANEL BY RT-PCR (RSV, FLU A&B, COVID)  RVPGX2
Influenza A by PCR: NEGATIVE
Influenza B by PCR: NEGATIVE
Resp Syncytial Virus by PCR: NEGATIVE
SARS Coronavirus 2 by RT PCR: NEGATIVE

## 2024-06-05 LAB — I-STAT CG4 LACTIC ACID, ED
Lactic Acid, Venous: 1.1 mmol/L (ref 0.5–1.9)
Lactic Acid, Venous: 1 mmol/L (ref 0.5–1.9)

## 2024-06-05 LAB — PRO BRAIN NATRIURETIC PEPTIDE: Pro Brain Natriuretic Peptide: 8968 pg/mL — ABNORMAL HIGH (ref <300.0)

## 2024-06-05 LAB — TROPONIN T, HIGH SENSITIVITY: Troponin T High Sensitivity: 35 ng/L — ABNORMAL HIGH (ref 0–19)

## 2024-06-05 MED ORDER — HYDRALAZINE HCL 20 MG/ML IJ SOLN: 2.0000 mg | Freq: Four times a day (QID) | INTRAMUSCULAR | Status: DC | PRN

## 2024-06-05 MED ORDER — ACETAMINOPHEN 325 MG PO TABS: 650.0000 mg | ORAL_TABLET | Freq: Four times a day (QID) | ORAL | Status: DC | PRN

## 2024-06-05 MED ORDER — CARVEDILOL 12.5 MG PO TABS
25.0000 mg | ORAL_TABLET | Freq: Two times a day (BID) | ORAL | Status: DC
Start: 2024-06-06 — End: 2024-06-07
  Administered 2024-06-06 – 2024-06-07 (×3): 25 mg via ORAL
  Filled 2024-06-05 (×3): qty 2

## 2024-06-05 MED ORDER — SODIUM CHLORIDE 0.9% FLUSH
3.0000 mL | Freq: Two times a day (BID) | INTRAVENOUS | Status: DC
  Administered 2024-06-06 – 2024-06-09 (×7): 3 mL via INTRAVENOUS

## 2024-06-05 MED ORDER — FUROSEMIDE 10 MG/ML IJ SOLN: 40.0000 mg | Freq: Two times a day (BID) | INTRAMUSCULAR | Status: DC

## 2024-06-05 MED ORDER — SENNOSIDES-DOCUSATE SODIUM 8.6-50 MG PO TABS: 1.0000 | ORAL_TABLET | Freq: Every evening | ORAL | Status: DC | PRN

## 2024-06-05 MED ORDER — SPIRONOLACTONE 25 MG PO TABS
25.0000 mg | ORAL_TABLET | Freq: Every evening | ORAL | Status: DC
Start: 2024-06-06 — End: 2024-06-07
  Administered 2024-06-06: 25 mg via ORAL
  Filled 2024-06-05: qty 1

## 2024-06-05 MED ORDER — FUROSEMIDE 10 MG/ML IJ SOLN
40.0000 mg | Freq: Once | INTRAMUSCULAR | Status: AC
  Administered 2024-06-05: 40 mg via INTRAVENOUS
  Filled 2024-06-05: qty 4

## 2024-06-05 MED ORDER — ACETAMINOPHEN 650 MG RE SUPP: 650.0000 mg | Freq: Four times a day (QID) | RECTAL | Status: DC | PRN

## 2024-06-05 MED ORDER — BENZONATATE 100 MG PO CAPS
100.0000 mg | ORAL_CAPSULE | Freq: Once | ORAL | Status: AC
  Administered 2024-06-05: 100 mg via ORAL
  Filled 2024-06-05: qty 1

## 2024-06-05 MED ORDER — ONDANSETRON HCL 4 MG/2ML IJ SOLN: 4.0000 mg | Freq: Four times a day (QID) | INTRAMUSCULAR | Status: DC | PRN

## 2024-06-05 MED ORDER — ONDANSETRON HCL 4 MG PO TABS: 4.0000 mg | ORAL_TABLET | Freq: Four times a day (QID) | ORAL | Status: DC | PRN

## 2024-06-05 MED ORDER — MELATONIN 3 MG PO TABS: 3.0000 mg | ORAL_TABLET | Freq: Every evening | ORAL | Status: DC | PRN

## 2024-06-05 MED ORDER — SACUBITRIL-VALSARTAN 97-103 MG PO TABS
1.0000 | ORAL_TABLET | Freq: Two times a day (BID) | ORAL | Status: DC
  Administered 2024-06-06 – 2024-06-07 (×2): 1 via ORAL
  Filled 2024-06-05 (×3): qty 1

## 2024-06-05 MED ORDER — PREGABALIN 25 MG PO CAPS
25.0000 mg | ORAL_CAPSULE | ORAL | Status: DC | PRN
Start: 2024-06-05 — End: 2024-06-06

## 2024-06-05 MED ORDER — APIXABAN 2.5 MG PO TABS
2.5000 mg | ORAL_TABLET | Freq: Two times a day (BID) | ORAL | Status: DC
  Administered 2024-06-06 – 2024-06-09 (×8): 2.5 mg via ORAL
  Filled 2024-06-05 (×8): qty 1

## 2024-06-05 NOTE — ED Notes (Signed)
 Redraw for troponin sent as well a dark green tube

## 2024-06-05 NOTE — H&P (Signed)
 History and Physical    Patient: Faith Gray FMW:[id number] DOB: 1941/09/05 DOA: 06/05/2024 DOS: the patient was seen and examined on 06/05/2024 PCP: Loris Elsie PARAS, PA-C  Patient coming from: Home  Chief Complaint: No chief complaint on file.  HPI: Phung Gryder is a 83 y.o. female with medical history significant for ischemic cardiomyopathy and EF of 45%, on Coumadin  for left ventricular aneurysm, CAD, and severe hypertension presents with cough and increased shortness of breath for the last 48 hours.  He denies any chest pain.  She denies fevers or significant sore throat or runny nose or headache.  Does feel like last time she was in the hospital with congestive heart failure.  He has been taking all of her medications as prescribed particularly her fluid pills.  She has not had any unusual dietary changes or events happen recently.  She is not sure why this would have flared up at this time. In the emergency department the patient was evaluated and felt to have a CHF exacerbation.  Her BNP was 8968.  Her chest x-ray revealed chronic interstitial markings.  He sbp was high.  She will be admitted to the hospital service for further diuresis.      Review of Systems: As mentioned in the history of present illness. All other systems reviewed and are negative. Past Medical History:  Diagnosis Date   Acute systolic heart failure (HCC) 09/09/2012   Anemia    Arthritis    CAD (coronary artery disease) 09/11/2012   Chest pain 09/07/2012   CHF (congestive heart failure) (HCC)    Dental caries    Essential hypertension 10/04/2012   Ischemic cardiomyopathy    NSTEMI (non-ST elevated myocardial infarction) (HCC) 09/11/2012   Shortness of breath    Past Surgical History:  Procedure Laterality Date   CARDIAC CATHETERIZATION     LEFT AND RIGHT HEART CATHETERIZATION WITH CORONARY ANGIOGRAM N/A 09/10/2012   Procedure: LEFT AND RIGHT HEART CATHETERIZATION WITH CORONARY ANGIOGRAM;  Surgeon:  Toribio JONELLE Fuel, MD;  Location: [facility] CATH LAB;  Service: Cardiovascular;  Laterality: N/A;   MULTIPLE EXTRACTIONS WITH ALVEOLOPLASTY N/A 02/16/2017   Procedure: MULTIPLE EXTRACTION WITH ALVEOLOPLASTY;  Surgeon: Sheryle Hamilton, DDS;  Location: [facility] OR;  Service: Oral Surgery;  Laterality: N/A;   Social History:  reports that she has quit smoking. Her smoking use included cigarettes. She has a 10 pack-year smoking history. She has never used smokeless tobacco. She reports that she does not drink alcohol and does not use drugs.  Allergies  Allergen Reactions   Vicodin [Hydrocodone-Acetaminophen ] Nausea And Vomiting    Family History  Problem Relation Age of Onset   Sickle cell anemia Brother     Prior to Admission medications   Medication Sig Start Date End Date Taking? Authorizing Provider  amLODipine  (NORVASC ) 10 MG tablet Take 10 mg by mouth daily. Patient not taking: Reported on 06/05/2024 12/25/23   [provider]  apixaban  (ELIQUIS ) 2.5 MG TABS tablet Take 1 tablet (2.5 mg total) by mouth 2 (two) times daily. 12/15/23   Rai, Ripudeep MARLA, MD  atorvastatin  (LIPITOR ) 80 MG tablet TAKE 1 TABLET BY MOUTH DAILY AT 6PM 05/19/19   Bensimhon, Toribio JONELLE, MD  carvedilol  (COREG ) 25 MG tablet Take 25 mg by mouth 2 (two) times daily with a meal.    [provider]  empagliflozin  (JARDIANCE ) 10 MG TABS tablet Take 1 tablet (10 mg total) by mouth daily. 01/09/24   Glena Harlene HERO, FNP  ezetimibe  (ZETIA ) 10 MG  tablet Take 1 tablet (10 mg total) by mouth daily. Patient taking differently: Take 10 mg by mouth every evening. 01/09/23 06/05/24  Danford, Lonni SQUIBB, MD  ferrous sulfate 325 (65 FE) MG EC tablet Take 325 mg by mouth daily after breakfast. Patient not taking: Reported on 06/05/2024    [provider]  isosorbide  mononitrate (IMDUR ) 30 MG 24 hr tablet Take 30 mg by mouth every evening.    [provider]  pregabalin  (LYRICA ) 25 MG capsule Take 25 mg by mouth as  needed (nerve pain).    [provider]  sacubitril -valsartan  (ENTRESTO ) 97-103 MG Take 1 tablet by mouth 2 (two) times daily. 01/09/24   Milford, Harlene HERO, FNP  spironolactone  (ALDACTONE ) 25 MG tablet TAKE 1 TABLET EVERY DAY (NEED MD APPOINTMENT) Patient taking differently: Take 25 mg by mouth every evening. 07/23/23   Bensimhon, Toribio SAUNDERS, MD  torsemide  (DEMADEX ) 20 MG tablet Take 1 tablet (20 mg total) by mouth daily. 12/16/23   Rai, Ripudeep MARLA, MD  TYLENOL  500 MG tablet Take 500-1,000 mg by mouth every 6 (six) hours as needed for mild pain or headache.    [provider]  furosemide  (LASIX ) 40 MG tablet Take 1 tablet (40 mg total) by mouth as needed. 02/05/18 04/13/20  Bensimhon, Toribio SAUNDERS, MD    Physical Exam: Vitals:   06/05/24 1831 06/05/24 2130  BP: (!) 148/97 (!) 145/93  Pulse: 94 99  Resp: 16 20  Temp: 98.3 F (36.8 C) 98.2 F (36.8 C)  TempSrc: Oral   SpO2: 99% 100%   Physical Exam:  General: No acute distress, well developed, well nourished HEENT: Normocephalic, atraumatic, PERRL Cardiovascular: Normal rate and rhythm. Distal pulses intact. Pulmonary: Normal pulmonary effort, normal breath sounds Gastrointestinal: Nondistended abdomen, soft, non-tender, normoactive bowel sounds, no organomegaly Musculoskeletal:Normal ROM, no lower ext edema Lymphadenopathy: No cervical LAD. Skin: Skin is warm and dry. Neuro: No focal deficits noted, AAOx3. PSYCH: Attentive and cooperative  Data Reviewed:  Results for orders placed or performed during the hospital encounter of 06/05/24 (from the past 24 hours)  Blood culture (routine x 2)     Status: None (Preliminary result)   Collection Time: 06/05/24  6:53 PM   Specimen: BLOOD LEFT FOREARM  Result Value Ref Range   Specimen Description      BLOOD LEFT FOREARM Performed at [facility], [address] [place], [place] [address]    Special Requests      Blood Culture adequate volume BOTTLES DRAWN AEROBIC  AND ANAEROBIC Performed at [facility], [address], [place], [place] [address]    Culture PENDING    Report Status PENDING   CBC with Differential     Status: Abnormal   Collection Time: 06/05/24  8:38 PM  Result Value Ref Range   WBC 5.6 4.0 - 10.5 K/uL   RBC 2.92 (L) 3.87 - 5.11 MIL/uL   Hemoglobin 8.0 (L) 12.0 - 15.0 g/dL   HCT 74.6 (L) 63.9 - 53.9 %   MCV 86.6 80.0 - 100.0 fL   MCH 27.4 26.0 - 34.0 pg   MCHC 31.6 30.0 - 36.0 g/dL   RDW 86.0 88.4 - 84.4 %   Platelets 312 150 - 400 K/uL   nRBC 0.0 0.0 - 0.2 %   Neutrophils Relative % 68 %   Neutro Abs 3.8 1.7 - 7.7 K/uL   Lymphocytes Relative 26 %   Lymphs Abs 1.4 0.7 - 4.0 K/uL   Monocytes  Relative 5 %   Monocytes Absolute 0.3 0.1 - 1.0 K/uL   Eosinophils Relative 1 %   Eosinophils Absolute 0.1 0.0 - 0.5 K/uL   Basophils Relative 0 %   Basophils Absolute 0.0 0.0 - 0.1 K/uL   Immature Granulocytes 0 %   Abs Immature Granulocytes 0.01 0.00 - 0.07 K/uL  Pro Brain natriuretic peptide     Status: Abnormal   Collection Time: 06/05/24  8:38 PM  Result Value Ref Range   Pro Brain Natriuretic Peptide 8,968.0 (H) <300.0 pg/mL  Resp panel by RT-PCR (RSV, Flu A&B, Covid) Anterior Nasal Swab     Status: None   Collection Time: 06/05/24  8:39 PM   Specimen: Anterior Nasal Swab  Result Value Ref Range   SARS Coronavirus 2 by RT PCR NEGATIVE NEGATIVE   Influenza A by PCR NEGATIVE NEGATIVE   Influenza B by PCR NEGATIVE NEGATIVE   Resp Syncytial Virus by PCR NEGATIVE NEGATIVE  I-Stat CG4 Lactic Acid     Status: None   Collection Time: 06/05/24  8:39 PM  Result Value Ref Range   Lactic Acid, Venous 1.1 0.5 - 1.9 mmol/L  Basic metabolic panel with GFR     Status: Abnormal   Collection Time: 06/05/24 10:00 PM  Result Value Ref Range   Sodium 140 135 - 145 mmol/L   Potassium 4.7 3.5 - 5.1 mmol/L   Chloride 106 98 - 111 mmol/L   CO2 18 (L) 22 - 32 mmol/L   Glucose, Bld 78 70 - 99 mg/dL   BUN 22 8 - 23 mg/dL    Creatinine, Ser 8.58 (H) 0.44 - 1.00 mg/dL   Calcium  9.3 8.9 - 10.3 mg/dL   GFR, Estimated 37 (L) >60 mL/min   Anion gap 16 (H) 5 - 15  I-Stat CG4 Lactic Acid     Status: None   Collection Time: 06/05/24 10:06 PM  Result Value Ref Range   Lactic Acid, Venous 1.0 0.5 - 1.9 mmol/L  I-stat chem 8, ED (not at [facility], [facility] or [facility])     Status: Abnormal   Collection Time: 06/05/24 10:06 PM  Result Value Ref Range   Sodium 140 135 - 145 mmol/L   Potassium 5.3 (H) 3.5 - 5.1 mmol/L   Chloride 112 (H) 98 - 111 mmol/L   BUN 28 (H) 8 - 23 mg/dL   Creatinine, Ser 8.39 (H) 0.44 - 1.00 mg/dL   Glucose, Bld 77 70 - 99 mg/dL   Calcium , Ion 1.09 (L) 1.15 - 1.40 mmol/L   TCO2 20 (L) 22 - 32 mmol/L   Hemoglobin 8.5 (L) 12.0 - 15.0 g/dL   HCT 74.9 (L) 63.9 - 53.9 %  Troponin T, High Sensitivity     Status: Abnormal   Collection Time: 06/05/24 10:22 PM  Result Value Ref Range   Troponin T High Sensitivity 35 (H) 0 - 19 ng/L    Echo 11/2023  1. Left ventricular ejection fraction, by estimation, is 40 to 45%. Left  ventricular ejection fraction by 2D MOD biplane is 42.7 %. The left  ventricle has mildly decreased function. The left ventricle demonstrates  regional wall motion abnormalities (see   scoring diagram/findings for description). Left ventricular diastolic  parameters are consistent with Grade II diastolic dysfunction  (pseudonormalization). Elevated left atrial pressure. The E/e' is 15.5.   2. Right ventricular systolic function is normal. The right ventricular  size is normal. There is normal pulmonary artery systolic pressure. The  estimated right ventricular systolic  pressure is 26.0 mmHg.   3. Left atrial size was mildly dilated.   4. The mitral valve is degenerative. Moderate mitral valve regurgitation.  No evidence of mitral stenosis. Moderate mitral annular calcification.   5. The aortic valve is tricuspid. Aortic valve regurgitation is not  visualized. Aortic valve sclerosis is  present, with no evidence of aortic  valve stenosis.   6. The inferior vena cava is normal in size with greater than 50%  respiratory variability, suggesting right atrial pressure of 3 mmHg.   Assessment and Plan: Acute CHF exacerbation/ Known mitral regug -  - IV Lasix , Hold oral Torsemide  for now -The patient follows with Dr. Bensimhon and would like to see cardiology in hospital if possible. - Continue carvedilol , Entresto , spironolactone , Jardiance , and Imdur   2. Htn, uncontrolled  - Follow as she is diuresed - Continue routine outpatient meds  3. CKD - Creatinine near baseline at 1.38  4.  MGUS - She follows with hematology at [facility]  5. LV aneurysm - On Eliquis  as she is high risk for embolic disease    Advance Care Planning:   Code Status: Full Code the patient names her daughter as her surrogate decision maker and she wants to be full code.  Consults: none  Family Communication: Patient's grandson was at bedside.  I also spoke to the patient's daughter who is her primary caregiver on the phone.  Severity of Illness: The appropriate patient status for this patient is INPATIENT. Inpatient status is judged to be reasonable and necessary in order to provide the required intensity of service to ensure the patient's safety. The patient's presenting symptoms, physical exam findings, and initial radiographic and laboratory data in the context of their chronic comorbidities is felt to place them at high risk for further clinical deterioration. Furthermore, it is not anticipated that the patient will be medically stable for discharge from the hospital within 2 midnights of admission.   * I certify that at the point of admission it is my clinical judgment that the patient will require inpatient hospital care spanning beyond 2 midnights from the point of admission due to high intensity of service, high risk for further deterioration and high frequency of surveillance  required.*  Author: ARTHEA CHILD, MD 06/05/2024 11:12 PM  For on call review [email or web address].

## 2024-06-05 NOTE — ED Notes (Addendum)
 Lab informed of 3rd light green sent down along with a dark green

## 2024-06-05 NOTE — ED Triage Notes (Signed)
 Patient states she is short of breath and having a productive cough. Has hx of CHF. Coughing up a lot of clear mucus. Seen at UC earlier and chest xray showed fluid on her lungs. Was advised to come to ED to be checked further. Denies pain. Denies N/V/D and no fever. Symptoms started 3 days ago and OTC meds not helping much per patient.

## 2024-06-05 NOTE — ED Notes (Signed)
 Patient is being discharged from the Urgent Care and sent to the Emergency Department via ED [facility]) . Per Provider, patient is in need of higher level of care due to SOB. Patient is aware and verbalizes understanding of plan of care.  Vitals:   06/05/24 1127 06/05/24 1206  BP: (!) 143/89 (!) 145/97  Pulse:  76  Resp:  (!) 22  Temp:    SpO2:  94%

## 2024-06-05 NOTE — ED Provider Notes (Signed)
 [place] EMERGENCY DEPARTMENT AT [facility] Provider Note   CSN: [id number] Arrival date & time: 06/05/24  1824     Patient presents with: No chief complaint on file.   Faith Gray is a 83 y.o. female.   83 yo F with a cc of not feeling well.  Patient cough congestion going on for a few days.  Feels like she cannot sleep because the cough keeps her up at night.  Feels like her legs are a bit more swollen than normal as well.  She does think it feels like the last time she was admitted to the hospital.          Prior to Admission medications   Medication Sig Start Date End Date Taking? Authorizing Provider  amLODipine  (NORVASC ) 10 MG tablet Take 10 mg by mouth daily. Patient not taking: Reported on 06/05/2024 12/25/23   [provider]  apixaban  (ELIQUIS ) 2.5 MG TABS tablet Take 1 tablet (2.5 mg total) by mouth 2 (two) times daily. 12/15/23   Rai, Ripudeep MARLA, MD  atorvastatin  (LIPITOR ) 80 MG tablet TAKE 1 TABLET BY MOUTH DAILY AT 6PM 05/19/19   Bensimhon, Toribio SAUNDERS, MD  carvedilol  (COREG ) 25 MG tablet Take 25 mg by mouth 2 (two) times daily with a meal.    [provider]  empagliflozin  (JARDIANCE ) 10 MG TABS tablet Take 1 tablet (10 mg total) by mouth daily. 01/09/24   Milford, Harlene HERO, FNP  ezetimibe  (ZETIA ) 10 MG tablet Take 1 tablet (10 mg total) by mouth daily. Patient taking differently: Take 10 mg by mouth every evening. 01/09/23 06/05/24  Danford, Lonni SQUIBB, MD  ferrous sulfate 325 (65 FE) MG EC tablet Take 325 mg by mouth daily after breakfast. Patient not taking: Reported on 06/05/2024    [provider]  isosorbide  mononitrate (IMDUR ) 30 MG 24 hr tablet Take 30 mg by mouth every evening.    [provider]  pregabalin  (LYRICA ) 25 MG capsule Take 25 mg by mouth as needed (nerve pain).    [provider]  sacubitril -valsartan  (ENTRESTO ) 97-103 MG Take 1 tablet by mouth 2 (two) times daily. 01/09/24   Milford, Harlene HERO, FNP  spironolactone  (ALDACTONE ) 25 MG tablet TAKE 1 TABLET EVERY DAY (NEED MD APPOINTMENT) Patient taking differently: Take 25 mg by mouth every evening. 07/23/23   Bensimhon, Toribio SAUNDERS, MD  torsemide  (DEMADEX ) 20 MG tablet Take 1 tablet (20 mg total) by mouth daily. 12/16/23   Rai, Ripudeep K, MD  TYLENOL  500 MG tablet Take 500-1,000 mg by mouth every 6 (six) hours as needed for mild pain or headache.    [provider]  furosemide  (LASIX ) 40 MG tablet Take 1 tablet (40 mg total) by mouth as needed. 02/05/18 04/13/20  Bensimhon, Toribio SAUNDERS, MD    Allergies: Vicodin [hydrocodone-acetaminophen ]    Review of Systems  Updated Vital Signs BP (!) 145/93   Pulse 99   Temp 98.2 F (36.8 C)   Resp 20   SpO2 100%   Physical Exam Vitals and nursing note reviewed.  Constitutional:      General: She is not in acute distress.    Appearance: She is well-developed. She is not diaphoretic.  HENT:     Head: Normocephalic and atraumatic.  Eyes:     Pupils: Pupils are equal, round, and reactive to light.  Cardiovascular:     Rate and Rhythm: Normal rate and regular rhythm.     Heart sounds: No murmur heard.  No friction rub. No gallop.  Pulmonary:     Effort: Pulmonary effort is normal.     Breath sounds: No wheezing or rales.  Abdominal:     General: There is no distension.     Palpations: Abdomen is soft.     Tenderness: There is no abdominal tenderness.  Musculoskeletal:        General: No tenderness.     Cervical back: Normal range of motion and neck supple.     Right lower leg: Edema present.     Left lower leg: Edema present.     Comments: Perhaps 1+ edema slightly worse on the right than the left.  Skin:    General: Skin is warm and dry.  Neurological:     Mental Status: She is alert and oriented to person, place, and time.  Psychiatric:        Behavior: Behavior normal.     (all labs ordered are listed, but only abnormal results are displayed) Labs Reviewed  CBC  WITH DIFFERENTIAL/PLATELET - Abnormal; Notable for the following components:      Result Value   RBC 2.92 (*)    Hemoglobin 8.0 (*)    HCT 25.3 (*)    All other components within normal limits  PRO BRAIN NATRIURETIC PEPTIDE - Abnormal; Notable for the following components:   Pro Brain Natriuretic Peptide 8,968.0 (*)    All other components within normal limits  BASIC METABOLIC PANEL WITH GFR - Abnormal; Notable for the following components:   CO2 18 (*)    Creatinine, Ser 1.41 (*)    GFR, Estimated 37 (*)    Anion gap 16 (*)    All other components within normal limits  I-STAT CHEM 8, ED - Abnormal; Notable for the following components:   Potassium 5.3 (*)    Chloride 112 (*)    BUN 28 (*)    Creatinine, Ser 1.60 (*)    Calcium , Ion 1.09 (*)    TCO2 20 (*)    Hemoglobin 8.5 (*)    HCT 25.0 (*)    All other components within normal limits  RESP PANEL BY RT-PCR (RSV, FLU A&B, COVID)  [id number]  CULTURE, BLOOD (ROUTINE X 2)  CULTURE, BLOOD (ROUTINE X 2)  I-STAT CG4 LACTIC ACID, ED  I-STAT CG4 LACTIC ACID, ED  TROPONIN T, HIGH SENSITIVITY    EKG: None  Radiology: DG Chest 2 View Result Date: 06/05/2024 EXAM: 2 VIEW(S) XRAY OF THE CHEST 06/05/2024 11:50:16 AM COMPARISON: 12/13/2023 CLINICAL HISTORY: Shortness of breath, cough, h/o CHF. Pt reports productive cough, nasal congestion, and constant shortness of breath x2 days. Cold symptoms started yesterday and SOB became more noticeable today. SOB even at rest. Pulse oximetry 90-95% in triage. Tachypneic. Pt has heart failure - last exacerbation March 2025. Coricidin taken at home with little relief. Denies fevers and chills. Pt also notes mild sore throat today. FINDINGS: LUNGS AND PLEURA: Chronic interstitial markings without acute opacity. No pleural effusion. No pneumothorax. HEART AND MEDIASTINUM: Atherosclerotic aortic calcifications. No acute abnormality of the cardiac and mediastinal silhouettes. BONES AND SOFT TISSUES: No acute  osseous abnormality. IMPRESSION: 1. No acute findings. 2. Chronic interstitial markings without acute opacity. Electronically signed by: Donnice Mania MD 06/05/2024 01:28 PM EDT RP Workstation: [id number]     Procedures   Medications Ordered in the ED  benzonatate  (TESSALON ) capsule 100 mg (100 mg Oral Given 06/05/24 2119)  furosemide  (LASIX ) injection 40 mg (40 mg Intravenous Given 06/05/24 2215)  Medical Decision Making Amount and/or Complexity of Data Reviewed Labs: ordered.  Risk Prescription drug management.   83 yo F with a chief complaint of difficulty breathing and leg swelling.  Going on for a couple days now.  Associated with cough and congestion.  Went to urgent care earlier today.  On record review had an x-ray of the chest and then they were concerned about the findings and sent her here for evaluation.    My independent interpretation of the x-ray without obvious focal infiltrate or pneumothorax.  Radiology read unchanged from prior.  BNP is elevated.  Unfortunately the patient's metabolic panel and troponin hemolyzed.  Will resend.  I-STAT Chem-8 with mild hyperkalemia.  Will give a bolus of Lasix .  Will discuss with medicine for admission.  The patients results and plan were reviewed and discussed.   Any x-rays performed were independently reviewed by myself.   Differential diagnosis were considered with the presenting HPI.  Medications  benzonatate  (TESSALON ) capsule 100 mg (100 mg Oral Given 06/05/24 2119)  furosemide  (LASIX ) injection 40 mg (40 mg Intravenous Given 06/05/24 2215)    Vitals:   06/05/24 1831 06/05/24 2130  BP: (!) 148/97 (!) 145/93  Pulse: 94 99  Resp: 16 20  Temp: 98.3 F (36.8 C) 98.2 F (36.8 C)  TempSrc: Oral   SpO2: 99% 100%    Final diagnoses:  Acute on chronic systolic congestive heart failure (HCC)    Admission/ observation were discussed with the admitting physician, patient and/or family  and they are comfortable with the plan.       Final diagnoses:  Acute on chronic systolic congestive heart failure [facility])    ED Discharge Orders     None          Emil Share, DO 06/05/24 2236

## 2024-06-05 NOTE — Discharge Instructions (Signed)
 Your chest x-ray shows fluid in the tissues of your lungs.  The COVID and flu test were both negative.  I think you need to go to the emergency room for further evaluation that we cannot provide in a clinic setting.

## 2024-06-05 NOTE — ED Provider Notes (Addendum)
 [facility]    CSN: [id number] Arrival date & time: 06/05/24  1058      History   Chief Complaint Chief Complaint  Patient presents with   Cough   Nasal Congestion   Shortness of Breath    HPI Faith Gray is a 83 y.o. female.    Cough Associated symptoms: shortness of breath   Shortness of Breath Associated symptoms: cough   For cough and nasal congestion that began about 2 days ago.  Then yesterday she began noting some shortness of breath.  No fever or chills, but she has had some sore throat today.  No nausea vomiting or diarrhea.  Past medical history is significant for heart failure and she last was admitted for an exacerbation in March of this year.  No chest pain   She is allergic to Vicodin  Past Medical History:  Diagnosis Date   Acute systolic heart failure (HCC) 09/09/2012   Anemia    Arthritis    CAD (coronary artery disease) 09/11/2012   Chest pain 09/07/2012   CHF (congestive heart failure) (HCC)    Dental caries    Essential hypertension 10/04/2012   Ischemic cardiomyopathy    NSTEMI (non-ST elevated myocardial infarction) (HCC) 09/11/2012   Shortness of breath     Patient Active Problem List   Diagnosis Date Noted   Acute on chronic combined systolic (congestive) and diastolic (congestive) heart failure (HCC) 12/13/2023   History of TIA (transient ischemic attack) 01/08/2023   Diplopia 01/08/2023   Hypocalcemia 01/08/2023   Chronic kidney disease, stage 3b (HCC) 01/08/2023   Hyperlipidemia 01/08/2023   MGUS (monoclonal gammopathy of unknown significance) 01/08/2023   Syncope 02/24/2013   Essential hypertension 10/04/2012   Hypertensive heart disease with heart failure (HCC) 10/04/2012   Chronic combined systolic and diastolic congestive heart failure (HCC) 09/17/2012   Aneurysm of left ventricle of heart 09/16/2012   Long term (current) use of anticoagulants 09/16/2012   CAD (coronary artery disease) 09/11/2012    History of non-ST elevation myocardial infarction (NSTEMI) 09/11/2012   SOB (shortness of breath) 09/07/2012   EKG abnormalities 09/07/2012   Normochromic anemia 09/07/2012    Past Surgical History:  Procedure Laterality Date   CARDIAC CATHETERIZATION     LEFT AND RIGHT HEART CATHETERIZATION WITH CORONARY ANGIOGRAM N/A 09/10/2012   Procedure: LEFT AND RIGHT HEART CATHETERIZATION WITH CORONARY ANGIOGRAM;  Surgeon: Toribio JONELLE Fuel, MD;  Location: [facility] CATH LAB;  Service: Cardiovascular;  Laterality: N/A;   MULTIPLE EXTRACTIONS WITH ALVEOLOPLASTY N/A 02/16/2017   Procedure: MULTIPLE EXTRACTION WITH ALVEOLOPLASTY;  Surgeon: Sheryle Hamilton, DDS;  Location: [facility] OR;  Service: Oral Surgery;  Laterality: N/A;    OB History   No obstetric history on file.      Home Medications    Prior to Admission medications   Medication Sig Start Date End Date Taking? Authorizing Provider  apixaban  (ELIQUIS ) 2.5 MG TABS tablet Take 1 tablet (2.5 mg total) by mouth 2 (two) times daily. 12/15/23  Yes Rai, Ripudeep K, MD  atorvastatin  (LIPITOR ) 80 MG tablet TAKE 1 TABLET BY MOUTH DAILY AT 6PM 05/19/19  Yes Bensimhon, Toribio JONELLE, MD  carvedilol  (COREG ) 25 MG tablet Take 25 mg by mouth 2 (two) times daily with a meal.   Yes [provider]  empagliflozin  (JARDIANCE ) 10 MG TABS tablet Take 1 tablet (10 mg total) by mouth daily. 01/09/24  Yes Milford, Harlene HERO, FNP  ezetimibe  (ZETIA ) 10 MG tablet Take 1 tablet (10 mg total) by  mouth daily. Patient taking differently: Take 10 mg by mouth every evening. 01/09/23 06/05/24 Yes Danford, Lonni SQUIBB, MD  isosorbide  mononitrate (IMDUR ) 30 MG 24 hr tablet Take 30 mg by mouth every evening.   Yes [provider]  pregabalin  (LYRICA ) 25 MG capsule Take 25 mg by mouth as needed (nerve pain).   Yes [provider]  sacubitril -valsartan  (ENTRESTO ) 97-103 MG Take 1 tablet by mouth 2 (two) times daily. 01/09/24  Yes Milford, Harlene HERO, FNP  spironolactone   (ALDACTONE ) 25 MG tablet TAKE 1 TABLET EVERY DAY (NEED MD APPOINTMENT) Patient taking differently: Take 25 mg by mouth every evening. 07/23/23  Yes Bensimhon, Toribio SAUNDERS, MD  torsemide  (DEMADEX ) 20 MG tablet Take 1 tablet (20 mg total) by mouth daily. 12/16/23  Yes Rai, Ripudeep K, MD  amLODipine  (NORVASC ) 10 MG tablet Take 10 mg by mouth daily. Patient not taking: Reported on 06/05/2024 12/25/23   [provider]  ferrous sulfate 325 (65 FE) MG EC tablet Take 325 mg by mouth daily after breakfast. Patient not taking: Reported on 06/05/2024    [provider]  TYLENOL  500 MG tablet Take 500-1,000 mg by mouth every 6 (six) hours as needed for mild pain or headache.    [provider]  furosemide  (LASIX ) 40 MG tablet Take 1 tablet (40 mg total) by mouth as needed. 02/05/18 04/13/20  Bensimhon, Toribio SAUNDERS, MD    Family History Family History  Problem Relation Age of Onset   Sickle cell anemia Brother     Social History Social History   Tobacco Use   Smoking status: Former    Current packs/day: 0.50    Average packs/day: 0.5 packs/day for 20.0 years (10.0 ttl pk-yrs)    Types: Cigarettes   Smokeless tobacco: Never  Vaping Use   Vaping status: Never Used  Substance Use Topics   Alcohol use: No   Drug use: No     Allergies   Vicodin [hydrocodone-acetaminophen ]   Review of Systems Review of Systems  Respiratory:  Positive for cough and shortness of breath.      Physical Exam Triage Vital Signs ED Triage Vitals  Encounter Vitals Group     BP 06/05/24 1120 (!) 146/92     Girls Systolic BP Percentile --      Girls Diastolic BP Percentile --      Boys Systolic BP Percentile --      Boys Diastolic BP Percentile --      Pulse Rate 06/05/24 1120 85     Resp 06/05/24 1120 (!) 30     Temp 06/05/24 1120 97.8 F (36.6 C)     Temp Source 06/05/24 1120 Oral     SpO2 06/05/24 1120 94 %     Weight --      Height --      Head Circumference --      Peak Flow --       Pain Score 06/05/24 1122 6     Pain Loc --      Pain Education --      Exclude from Growth Chart --    No data found.  Updated Vital Signs BP (!) 145/97 (BP Location: Left Arm)   Pulse 76   Temp 97.8 F (36.6 C) (Oral)   Resp (!) 22   SpO2 94%   Visual Acuity Right Eye Distance:   Left Eye Distance:   Bilateral Distance:    Right Eye Near:   Left Eye Near:  Bilateral Near:     Physical Exam Vitals (Initial respiratory rate in triage is 30, but is down to 26 by my count during the physical exam.) reviewed.  Constitutional:      General: She is not in acute distress.    Appearance: She is not ill-appearing, toxic-appearing or diaphoretic.  HENT:     Nose: Congestion present.     Mouth/Throat:     Mouth: Mucous membranes are moist.     Comments: There is mild erythema of the oropharynx.  No tonsillar hypertrophy Eyes:     Extraocular Movements: Extraocular movements intact.     Conjunctiva/sclera: Conjunctivae normal.     Pupils: Pupils are equal, round, and reactive to light.  Cardiovascular:     Rate and Rhythm: Normal rate and regular rhythm.     Heart sounds: No murmur heard. Pulmonary:     Effort: No respiratory distress.     Breath sounds: No stridor. No wheezing, rhonchi or rales.     Comments: No rales rhonchi or wheeze Musculoskeletal:     Cervical back: Neck supple.  Lymphadenopathy:     Cervical: No cervical adenopathy.  Skin:    Capillary Refill: Capillary refill takes less than 2 seconds.     Coloration: Skin is not jaundiced or pale.  Neurological:     General: No focal deficit present.     Mental Status: She is alert and oriented to person, place, and time.  Psychiatric:        Behavior: Behavior normal.      UC Treatments / Results  Labs (all labs ordered are listed, but only abnormal results are displayed) Labs Reviewed  POC COVID19/FLU A&B COMBO - Normal    EKG   Radiology No results found.  Procedures Procedures  (including critical care time)  Medications Ordered in UC Medications - No data to display  Initial Impression / Assessment and Plan / UC Course  I have reviewed the triage vital signs and the nursing notes.  Pertinent labs & imaging results that were available during my care of the patient were reviewed by me and considered in my medical decision making (see chart for details).     COVID test and flu test are negative.  Chest x-ray by my review shows some interstitial fluid.  Her respiratory rate did improve after resting, but I am still concerned that she is having a heart failure exacerbation  I have asked her to proceed to the emergency room as I think she needs testing that we cannot provide on an urgent basis here in the clinic setting.  She will go by private vehicle. Final Clinical Impressions(s) / UC Diagnoses   Final diagnoses:  SOB (shortness of breath)  Acute upper respiratory infection     Discharge Instructions      Your chest x-ray shows fluid in the tissues of your lungs.  The COVID and flu test were both negative.  I think you need to go to the emergency room for further evaluation that we cannot provide in a clinic setting.     ED Prescriptions   None    PDMP not reviewed this encounter.   Vonna Sharlet POUR, MD 06/05/24 1214    Vonna Sharlet POUR, MD 06/05/24 [phone]

## 2024-06-05 NOTE — ED Triage Notes (Addendum)
 Pt reports productive cough, nasal congestion, and constant shortness of breath x2 days. Cold symptoms started yesterday and SOB became more noticeable today. SOB even at rest. Pulse oximetry 90-95% in triage. Tachypneic. Pt has heart failure - last exacerbation March 2025. Coricidin taken at home with little relief. Denies fevers and chills. Pt also notes mild sore throat today.

## 2024-06-06 ENCOUNTER — Encounter (HOSPITAL_COMMUNITY): Payer: Self-pay | Admitting: Internal Medicine

## 2024-06-06 ENCOUNTER — Other Ambulatory Visit: Payer: Self-pay

## 2024-06-06 DIAGNOSIS — I340 Nonrheumatic mitral (valve) insufficiency: Secondary | ICD-10-CM | POA: Diagnosis not present

## 2024-06-06 DIAGNOSIS — I2510 Atherosclerotic heart disease of native coronary artery without angina pectoris: Secondary | ICD-10-CM | POA: Diagnosis not present

## 2024-06-06 DIAGNOSIS — I5043 Acute on chronic combined systolic (congestive) and diastolic (congestive) heart failure: Secondary | ICD-10-CM

## 2024-06-06 DIAGNOSIS — I10 Essential (primary) hypertension: Secondary | ICD-10-CM | POA: Diagnosis not present

## 2024-06-06 DIAGNOSIS — N1832 Chronic kidney disease, stage 3b: Secondary | ICD-10-CM

## 2024-06-06 DIAGNOSIS — I5042 Chronic combined systolic (congestive) and diastolic (congestive) heart failure: Secondary | ICD-10-CM | POA: Diagnosis not present

## 2024-06-06 LAB — CBC
HCT: 23.9 % — ABNORMAL LOW (ref 36.0–46.0)
Hemoglobin: 7.5 g/dL — ABNORMAL LOW (ref 12.0–15.0)
MCH: 26.9 pg (ref 26.0–34.0)
MCHC: 31.4 g/dL (ref 30.0–36.0)
MCV: 85.7 fL (ref 80.0–100.0)
Platelets: 296 K/uL (ref 150–400)
RBC: 2.79 MIL/uL — ABNORMAL LOW (ref 3.87–5.11)
RDW: 13.7 % (ref 11.5–15.5)
WBC: 4.5 K/uL (ref 4.0–10.5)
nRBC: 0 % (ref 0.0–0.2)

## 2024-06-06 LAB — BASIC METABOLIC PANEL WITH GFR
Anion gap: 15 (ref 5–15)
BUN: 20 mg/dL (ref 8–23)
CO2: 21 mmol/L — ABNORMAL LOW (ref 22–32)
Calcium: 9.4 mg/dL (ref 8.9–10.3)
Chloride: 104 mmol/L (ref 98–111)
Creatinine, Ser: 1.62 mg/dL — ABNORMAL HIGH (ref 0.44–1.00)
GFR, Estimated: 31 mL/min — ABNORMAL LOW (ref >60)
Glucose, Bld: 94 mg/dL (ref 70–99)
Potassium: 4.1 mmol/L (ref 3.5–5.1)
Sodium: 139 mmol/L (ref 135–145)

## 2024-06-06 LAB — MAGNESIUM: Magnesium: 2.4 mg/dL (ref 1.7–2.4)

## 2024-06-06 LAB — PRO BRAIN NATRIURETIC PEPTIDE: Pro Brain Natriuretic Peptide: 12953 pg/mL — ABNORMAL HIGH (ref <300.0)

## 2024-06-06 MED ORDER — ATORVASTATIN CALCIUM 40 MG PO TABS: 80.0000 mg | ORAL_TABLET | Freq: Every day | ORAL | Status: DC

## 2024-06-06 MED ORDER — ISOSORBIDE MONONITRATE ER 30 MG PO TB24
30.0000 mg | ORAL_TABLET | Freq: Every evening | ORAL | Status: DC
  Administered 2024-06-06: 30 mg via ORAL
  Filled 2024-06-06: qty 1

## 2024-06-06 MED ORDER — ATORVASTATIN CALCIUM 40 MG PO TABS
80.0000 mg | ORAL_TABLET | Freq: Every day | ORAL | Status: DC
  Administered 2024-06-06 – 2024-06-08 (×3): 80 mg via ORAL
  Filled 2024-06-06 (×3): qty 2

## 2024-06-06 MED ORDER — GUAIFENESIN-DM 100-10 MG/5ML PO SYRP
15.0000 mL | ORAL_SOLUTION | ORAL | Status: AC | PRN
  Administered 2024-06-06 – 2024-06-07 (×2): 15 mL via ORAL
  Filled 2024-06-06 (×2): qty 20

## 2024-06-06 MED ORDER — EMPAGLIFLOZIN 10 MG PO TABS
10.0000 mg | ORAL_TABLET | Freq: Every day | ORAL | Status: DC
  Administered 2024-06-07 – 2024-06-09 (×3): 10 mg via ORAL
  Filled 2024-06-06 (×3): qty 1

## 2024-06-06 MED ORDER — EZETIMIBE 10 MG PO TABS
10.0000 mg | ORAL_TABLET | Freq: Every evening | ORAL | Status: DC
  Administered 2024-06-06 – 2024-06-08 (×3): 10 mg via ORAL
  Filled 2024-06-06 (×3): qty 1

## 2024-06-06 NOTE — TOC Initial Note (Signed)
 Transition of Care [facility]) - Initial/Assessment Note   Patient Details  Name: Faith Gray MRN: [id number] Date of Birth: 10-16-1940  [facility] CM/SW Contact:    [name], LCSW Phone Number: 06/06/2024, 11:32 AM  Clinical Narrative: Patient is from home. Care management following for discharge needs.  Expected Discharge Plan: Home/Self Care Barriers to Discharge: Continued Medical Work up  Expected Discharge Plan and Services In-house Referral: Clinical Social Work Living arrangements for the past 2 months: Single Family Home  Prior Living Arrangements/Services Living arrangements for the past 2 months: Single Family Home Lives with:: Relatives Patient language and need for interpreter reviewed:: Yes Do you feel safe going back to the place where you live?: Yes      Need for Family Participation in Patient Care: No (Comment) Care giver support system in place?: Yes (comment) Criminal Activity/Legal Involvement Pertinent to Current Situation/Hospitalization: No - Comment as needed  Activities of Daily Living ADL Screening (condition at time of admission) Independently performs ADLs?: Yes (appropriate for developmental age) Is the patient deaf or have difficulty hearing?: No Does the patient have difficulty seeing, even when wearing glasses/contacts?: No Does the patient have difficulty concentrating, remembering, or making decisions?: No  Emotional Assessment Orientation: : Oriented to Self, Oriented to Place, Oriented to  Time, Oriented to Situation Alcohol / Substance Use: Not Applicable Psych Involvement: No (comment)  Admission diagnosis:  Acute exacerbation of CHF (congestive heart failure) (HCC) [I50.9] Acute on chronic systolic congestive heart failure (HCC) [I50.23] Patient Active Problem List   Diagnosis Date Noted   Acute exacerbation of CHF (congestive heart failure) (HCC) 06/05/2024   Acute on chronic combined systolic (congestive) and  diastolic (congestive) heart failure (HCC) 12/13/2023   History of TIA (transient ischemic attack) 01/08/2023   Diplopia 01/08/2023   Hypocalcemia 01/08/2023   Chronic kidney disease, stage 3b (HCC) 01/08/2023   Hyperlipidemia 01/08/2023   MGUS (monoclonal gammopathy of unknown significance) 01/08/2023   Syncope 02/24/2013   Essential hypertension 10/04/2012   Hypertensive heart disease with heart failure (HCC) 10/04/2012   Chronic combined systolic and diastolic congestive heart failure (HCC) 09/17/2012   Aneurysm of left ventricle of heart 09/16/2012   Long term (current) use of anticoagulants 09/16/2012   CAD (coronary artery disease) 09/11/2012   History of non-ST elevation myocardial infarction (NSTEMI) 09/11/2012   SOB (shortness of breath) 09/07/2012   EKG abnormalities 09/07/2012   Normochromic anemia 09/07/2012   PCP:  Loris Elsie PARAS, PA-C Pharmacy:   [id number] - [place], [place] - [address] [place] [address] Phone: [phone] Fax: [phone]  [facility] [address] [place] [address] Phone: [phone] Fax: [phone]  [facility] - [place], [place] - [address] [place] [address] Phone: [phone] Fax: [phone]  Social Drivers of Health (SDOH) Social History: SDOH Screenings   Food Insecurity: No Food Insecurity (06/06/2024)  Housing: Low Risk  (06/06/2024)  Transportation Needs: No Transportation Needs (06/06/2024)  Utilities: Not At Risk (06/06/2024)  Social Connections: Moderately Integrated (06/06/2024)  Tobacco Use: Medium Risk (06/05/2024)   SDOH Interventions:    Readmission Risk Interventions     No data to display

## 2024-06-06 NOTE — Consult Note (Addendum)
 Cardiology Consultation   Patient ID: Faith Gray MRN: [id number]; DOB: 1941/06/16  Admit date: 06/05/2024 Date of Consult: 06/06/2024  PCP:  Loris Elsie PARAS, PA-C   [facility] Providers Cardiologist:  None  Advanced Heart Failure:  Toribio Fuel, MD       Patient Profile: Faith Gray is a 83 y.o. female with a history of CAD noted on cardiac catheterization in 08/2012 (treated medically), ischemic cardiomyopathy/ chronic HFrEF with EF of 40-45% on last Echo in 11/2023, LV aneurysm on Eliquis , moderate to severe mitral regurgitation, hypertension, anemia, and MGUS who is being seen 06/06/2024 for the evaluation of acute on chronic CHF at the request of Dr. Jillian.  History of Present Illness: Faith Gray is a 83 year old female with the above history who is followed by Dr. Fuel in the Advanced CHF clinic.  Patient was initially diagnosed with CHF during an admission in 08/2012.  Echo at that time showed LVEF of 25-30% with diffuse hypokinesis mild MR.  Cardiac catheterization during that admission  30-40% stenosis of mid LAD, 30% stenosis of ostial LCx, diffuse 30 to 40% stenosis of mid RCA with ruptured plaque and 60% stenosis of distal RCA, and moderate diffuse disease of PDA.  There was also focal area of myocardial bridging with severe systolic compression of the distal RCA.  EF and LV gram was 25 to 30% and showed a large aneurysm at the base to mid inferior wall.  Medical therapy recommended. She was placed on Coumadin  she was felt to be high risk for embolic phenomenon with LV aneurysm but this was subsequently switched to Eliquis .  EF has improved some over the years.  Last Echo in 11/2023 during that admission for acute on chronic CHF showed LVEF of 40-45% with akinesis of the basal inferior segment and hypokinesis of the mid and distal inferior wall, mid inferior septal segment, and basal inferior septal segment but no evidence of LV thrombus, grade 2 diastolic  dysfunction, normal RV size and function, and moderate MR.  Patient presented to the [facility] ED on 06/05/2024 for further evaluation of shortness of breath, nasal congestion, and cough.  EKG showed sinus rhythm with a lot of underlying artifact but no acute ischemic changes compared to prior tracings.  High sensitive troponin 35.  proBNP 8,968.  Chest x-ray showed chronic interstitial markings but no acute findings. WBC 5.6, Hgb 8.0, Plts 312. Na 140, K 4.7, Glucose 78, BUN 22, Cr 1.41. Anion gap 16. Lactic acid 1.1. Respiratory panel negative for COVID-19, influenza A/B, and RSV. Blood cultures negative to date. She was admitted for acute CHF and started on IV Lasix . Cardiology consulted.   Patient states she was in her usual state of health until 3 days ago when she started having a nonproductive cough with associated shortness of breath.  She reports shortness of breath is mainly just when she has coughing fits and spells.  She denies much shortness of breath outside these episodes.  No orthopnea, PND, edema.  Weight has been stable at home.  No chest pain, palpitations, lightheadedness, dizziness, syncope.  She denies any other infectious symptoms to me.  No fevers or nasal congestion.  She does states she had mild irritation of her throat earlier this week but nothing significant and this is not resolved.  No nausea, vomiting, diarrhea.  She is currently on Eliquis  for anticoagulation.  She states there is been discussion of switching back to Coumadin  due to cost.  She states she tried a couple  of days of Coumadin  about a month ago but had rectal bleeding with this.  Therefore, she stopped the Coumadin  and restarted Eliquis  and has not had any recurrent abnormal bleeding.  She was given a dose of Robitussin last night in the ED as well as 1 dose of IV Lasix .  There is no documented urinary output but she states she urinated a lot.  She is feeling much better this morning.  Cough is significantly better  as is the shortness of breath.  She states she feels close to her baseline.  Past Medical History:  Diagnosis Date   Acute systolic heart failure (HCC) 09/09/2012   Anemia    Arthritis    CAD (coronary artery disease) 09/11/2012   Chest pain 09/07/2012   CHF (congestive heart failure) (HCC)    Dental caries    Essential hypertension 10/04/2012   Ischemic cardiomyopathy    NSTEMI (non-ST elevated myocardial infarction) (HCC) 09/11/2012   Shortness of breath     Past Surgical History:  Procedure Laterality Date   CARDIAC CATHETERIZATION     LEFT AND RIGHT HEART CATHETERIZATION WITH CORONARY ANGIOGRAM N/A 09/10/2012   Procedure: LEFT AND RIGHT HEART CATHETERIZATION WITH CORONARY ANGIOGRAM;  Surgeon: Toribio JONELLE Fuel, MD;  Location: [facility] CATH LAB;  Service: Cardiovascular;  Laterality: N/A;   MULTIPLE EXTRACTIONS WITH ALVEOLOPLASTY N/A 02/16/2017   Procedure: MULTIPLE EXTRACTION WITH ALVEOLOPLASTY;  Surgeon: Sheryle Hamilton, DDS;  Location: [facility] OR;  Service: Oral Surgery;  Laterality: N/A;     Home Medications:  Prior to Admission medications   Medication Sig Start Date End Date Taking? Authorizing Provider  amLODipine  (NORVASC ) 10 MG tablet Take 10 mg by mouth daily. Patient not taking: Reported on 06/05/2024 12/25/23   [provider]  apixaban  (ELIQUIS ) 2.5 MG TABS tablet Take 1 tablet (2.5 mg total) by mouth 2 (two) times daily. 12/15/23   Rai, Ripudeep MARLA, MD  atorvastatin  (LIPITOR ) 80 MG tablet TAKE 1 TABLET BY MOUTH DAILY AT 6PM 05/19/19   Bensimhon, Toribio JONELLE, MD  carvedilol  (COREG ) 25 MG tablet Take 25 mg by mouth 2 (two) times daily with a meal.    [provider]  empagliflozin  (JARDIANCE ) 10 MG TABS tablet Take 1 tablet (10 mg total) by mouth daily. 01/09/24   Milford, Harlene HERO, FNP  ezetimibe  (ZETIA ) 10 MG tablet Take 1 tablet (10 mg total) by mouth daily. Patient taking differently: Take 10 mg by mouth every evening. 01/09/23 06/05/24  Danford, Lonni SQUIBB, MD   ferrous sulfate 325 (65 FE) MG EC tablet Take 325 mg by mouth daily after breakfast. Patient not taking: Reported on 06/05/2024    [provider]  isosorbide  mononitrate (IMDUR ) 30 MG 24 hr tablet Take 30 mg by mouth every evening.    [provider]  pregabalin  (LYRICA ) 25 MG capsule Take 25 mg by mouth as needed (nerve pain).    [provider]  sacubitril -valsartan  (ENTRESTO ) 97-103 MG Take 1 tablet by mouth 2 (two) times daily. 01/09/24   Milford, Harlene HERO, FNP  spironolactone  (ALDACTONE ) 25 MG tablet TAKE 1 TABLET EVERY DAY (NEED MD APPOINTMENT) Patient taking differently: Take 25 mg by mouth every evening. 07/23/23   Bensimhon, Toribio JONELLE, MD  torsemide  (DEMADEX ) 20 MG tablet Take 1 tablet (20 mg total) by mouth daily. 12/16/23   Rai, Ripudeep MARLA, MD  TYLENOL  500 MG tablet Take 500-1,000 mg by mouth every 6 (six) hours as needed for mild pain or headache.    [provider]  furosemide  (LASIX ) 40 MG tablet Take 1 tablet (40 mg total) by mouth as needed. 02/05/18 04/13/20  Bensimhon, Toribio SAUNDERS, MD    Scheduled Meds:  apixaban   2.5 mg Oral BID   carvedilol   25 mg Oral BID WC   empagliflozin   10 mg Oral Daily   ezetimibe   10 mg Oral QPM   sacubitril -valsartan   1 tablet Oral BID   sodium chloride  flush  3 mL Intravenous Q12H   spironolactone   25 mg Oral QPM   Continuous Infusions:  PRN Meds: acetaminophen  **OR** acetaminophen , guaiFENesin -dextromethorphan , hydrALAZINE , melatonin, ondansetron  **OR** ondansetron  (ZOFRAN ) IV, pregabalin , senna-docusate  Allergies:    Allergies  Allergen Reactions   Vicodin [Hydrocodone-Acetaminophen ] Nausea And Vomiting    Social History:   Social History   Socioeconomic History   Marital status: Single    Spouse name: Not on file   Number of children: 2   Years of education: Not on file   Highest education level: Not on file  Occupational History   Not on file  Tobacco Use   Smoking status: Former    Current  packs/day: 0.50    Average packs/day: 0.5 packs/day for 20.0 years (10.0 ttl pk-yrs)    Types: Cigarettes   Smokeless tobacco: Never  Vaping Use   Vaping status: Never Used  Substance and Sexual Activity   Alcohol use: No   Drug use: No   Sexual activity: Never  Other Topics Concern   Not on file  Social History Narrative   Right handed   Wear glasses    Drinks coffee one cup in am   Social Drivers of Health   Financial Resource Strain: Not on file  Food Insecurity: No Food Insecurity (06/06/2024)   Hunger Vital Sign    Worried About Running Out of Food in the Last Year: Never true    Ran Out of Food in the Last Year: Never true  Transportation Needs: No Transportation Needs (06/06/2024)   PRAPARE - Administrator, Civil Service (Medical): No    Lack of Transportation (Non-Medical): No  Physical Activity: Not on file  Stress: Not on file  Social Connections: Moderately Integrated (06/06/2024)   Social Connection and Isolation Panel    Frequency of Communication with Friends and Family: More than three times a week    Frequency of Social Gatherings with Friends and Family: Three times a week    Attends Religious Services: 1 to 4 times per year    Active Member of Clubs or Organizations: No    Attends Banker Meetings: 1 to 4 times per year    Marital Status: Never married  Intimate Partner Violence: Not At Risk (06/06/2024)   Humiliation, Afraid, Rape, and Kick questionnaire    Fear of Current or Ex-Partner: No    Emotionally Abused: No    Physically Abused: No    Sexually Abused: No    Family History:   Family History  Problem Relation Age of Onset   Sickle cell anemia Brother      ROS:  Please see the history of present illness.   Physical Exam/Data: Vitals:   06/05/24 2130 06/06/24 0115 06/06/24 0244 06/06/24 0700  BP: (!) 145/93 (!) 129/95 (!) 143/98 124/87  Pulse: 99 88 (!) 46 90  Resp: 20 20 19 20   Temp: 98.2 F (36.8 C) 98.3 F  (36.8 C) 98.1 F (36.7 C) 98.3 F (36.8 C)  TempSrc:   Oral Oral  SpO2: 100% 99%  91% 100%  Weight:   46.7 kg   Height:   4' 11 (1.499 m)    No intake or output data in the 24 hours ending 06/06/24 0856    06/06/2024    2:44 AM 01/09/2024    2:38 PM 12/15/2023    5:53 AM  Last 3 Weights  Weight (lbs) 103 lb 100 lb 6.4 oz 100 lb 3.2 oz  Weight (kg) 46.72 kg 45.541 kg 45.45 kg     Body mass index is 20.8 kg/m.  General: 83 y.o. thin African-American female resting comfortably in no acute distress. HEENT: Normocephalic and atraumatic. Sclera clear.  Neck: Supple. No JVD. Heart: RRR. III/ VI systolic murmur.  Lungs: No increased work of breathing. Clear to ausculation bilaterally. No wheezes, rhonchi, or rales.  Abdomen: Soft, non-distended, and non-tender to palpation.  Extremities: No lower extremity edema.    Skin: Warm and dry. Neuro: Alert and oriented x3. No focal deficits. Psych: Normal affect. Responds appropriately.   EKG:  The EKG was personally reviewed and demonstrates:  Normal sinus rhythm with no acute ischemic changes. Telemetry:  Telemetry was personally reviewed and demonstrates:  Sinus rhythm with occasional PVCs. Rates in the 60s to 80s.  Relevant CV Studies:  Echocardiogram 12/14/2023: Impressions: 1. Left ventricular ejection fraction, by estimation, is 40 to 45%. Left  ventricular ejection fraction by 2D MOD biplane is 42.7 %. The left  ventricle has mildly decreased function. The left ventricle demonstrates  regional wall motion abnormalities (see   scoring diagram/findings for description). Left ventricular diastolic  parameters are consistent with Grade II diastolic dysfunction  (pseudonormalization). Elevated left atrial pressure. The E/e' is 15.5.   2. Right ventricular systolic function is normal. The right ventricular  size is normal. There is normal pulmonary artery systolic pressure. The  estimated right ventricular systolic pressure is 26.0  mmHg.   3. Left atrial size was mildly dilated.   4. The mitral valve is degenerative. Moderate mitral valve regurgitation.  No evidence of mitral stenosis. Moderate mitral annular calcification.   5. The aortic valve is tricuspid. Aortic valve regurgitation is not  visualized. Aortic valve sclerosis is present, with no evidence of aortic  valve stenosis.   6. The inferior vena cava is normal in size with greater than 50%  respiratory variability, suggesting right atrial pressure of 3 mmHg.   Conclusion(s)/Recommendation(s): No left ventricular mural or apical  thrombus/thrombi.    Laboratory Data: High Sensitivity Troponin:  No results for input(s): TROPONINIHS in the last 720 hours.   Chemistry Recent Labs  Lab 06/05/24 2200 06/05/24 2206  NA 140 140  K 4.7 5.3*  CL 106 112*  CO2 18*  --   GLUCOSE 78 77  BUN 22 28*  CREATININE 1.41* 1.60*  CALCIUM  9.3  --   GFRNONAA 37*  --   ANIONGAP 16*  --     No results for input(s): PROT, ALBUMIN, AST, ALT, ALKPHOS, BILITOT in the last 168 hours. Lipids No results for input(s): CHOL, TRIG, HDL, LABVLDL, LDLCALC, CHOLHDL in the last 168 hours.  Hematology Recent Labs  Lab 06/05/24 2038 06/05/24 2206 06/06/24 0837  WBC 5.6  --  4.5  RBC 2.92*  --  2.79*  HGB 8.0* 8.5* 7.5*  HCT 25.3* 25.0* 23.9*  MCV 86.6  --  85.7  MCH 27.4  --  26.9  MCHC 31.6  --  31.4  RDW 13.9  --  13.7  PLT 312  --  296  Thyroid No results for input(s): TSH, FREET4 in the last 168 hours.  BNP Recent Labs  Lab 06/05/24 2038  PROBNP 8,968.0*    DDimer No results for input(s): DDIMER in the last 168 hours.  Radiology/Studies:  DG Chest 2 View Result Date: 06/05/2024 EXAM: 2 VIEW(S) XRAY OF THE CHEST 06/05/2024 11:50:16 AM COMPARISON: 12/13/2023 CLINICAL HISTORY: Shortness of breath, cough, h/o CHF. Pt reports productive cough, nasal congestion, and constant shortness of breath x2 days. Cold symptoms started  yesterday and SOB became more noticeable today. SOB even at rest. Pulse oximetry 90-95% in triage. Tachypneic. Pt has heart failure - last exacerbation March 2025. Coricidin taken at home with little relief. Denies fevers and chills. Pt also notes mild sore throat today. FINDINGS: LUNGS AND PLEURA: Chronic interstitial markings without acute opacity. No pleural effusion. No pneumothorax. HEART AND MEDIASTINUM: Atherosclerotic aortic calcifications. No acute abnormality of the cardiac and mediastinal silhouettes. BONES AND SOFT TISSUES: No acute osseous abnormality. IMPRESSION: 1. No acute findings. 2. Chronic interstitial markings without acute opacity. Electronically signed by: Donnice Mania MD 06/05/2024 01:28 PM EDT RP Workstation: [id number]     Assessment and Plan:  Acute on Chronic HFrEF Patient has a long history of ischemic cardiomyopathy and chronic HFrE dating back to 2013.SABRA  Last Echo in 11/2023 showed VEF of 40-45% with akinesis of the basal inferior segment and hypokinesis of the mid and distal inferior wall, mid inferior septal segment, and basal inferior septal segment but no evidence of LV thrombus, grade 2 diastolic dysfunction, normal RV size and function, and moderate MR. She now presents with cough and shortness of breath . proBNP 8, 968 >> 12,953.  Chest x-ray showed chronic interstitial markings but no acute findings.  She received one dose of IV Lasix  in the ED. No documented output but patient states she urinated a lot in the ED.  Renal function stable. - proBNP is markedly elevated and high today than yesterday. However, she is feeling much better and does not appear volume overloaded. - Will hold additional IV Lasix  for now. I think we can transition back to home Torsemide . Will review with MD. - Continue Entresto  97-103mg  twice daily. - Continue Coreg  25mg  twice daily. - Continue Jardiance  10mg  daily.  - Continue Spironolactone  25mg  daily. - Continue to monitor daily weights,  strict I/Os, and renal function.  CAD LHC in 08/2012 showed  30-40% stenosis of mid LAD, 30% stenosis of ostial LCx, diffuse 30 to 40% stenosis of mid RCA with ruptured plaque and 60% stenosis of distal RCA, and moderate diffuse disease of PDA.  There was also focal area of myocardial bridging with severe systolic compression of the distal RCA. Medical therapy was recommended. High-sensitivity troponin minimally elevated in the 30s consistent with demand ischemia.  - No chest pain.  - Continue Coreg  25mg  twice daily.  - Can resume home Imdur  30mg  daily. - No Aspirin  given need for DOAC.  - Continue Zetia  10mg  daily. Will resume home Lipitor  80mg  daily as well.  LV Aneurysm Noted on cardiac catheterization in 08/2012 at time of CHF diagnosis. Felt to be high risk for embolic phenomenon and on chronic anticoagulation. NO evidence of thrombus on last Echo in 11/2023.  - Continue Eliquis  2.5mg  twice daily. Reduced dose given age and weight.  Moderate Mitral Regurgitation Noted on Echo in 11/2023.  - Will need to continue to monitor as an outpatient.  Hypertension BP mostly well controlled.  - Contin Entresto , Coreg , and Spironolactone  as above.  -  Will resume home Imdur  as well.  CKD Stage IIIb Creatinine 1.41 on admission. Baseline creatinine around 1.5. - Overall stable at 1.62 today.  Otherwise, per primary team: - MGUS - Anemia: hemoglobin 7.5 today   Risk Assessment/Risk Scores:   [place]  Heart Association (NYHA) Functional Class NYHA Class II   For questions or updates, please contact [facility] Please consult [email or web address] for contact info under   Signed, Callie E Goodrich, PA-C  06/06/2024 8:56 AM  History and all data above reviewed.  I personally took the history today, performed the physical exam and formulated the assessment and plan.  I reviewed all relevant tests and studies. Patient examined.  I agree with the findings as above.    The patient  states she has been feeling okay until Tuesday.  She started having coughing.  She felt like she might have some bronchitis.  She gets short of breath with this.  She put up with that for about a day and came to the emergency room with findings as above.  She has received IV diuretics and is much improved and back to baseline.  Creatinine has bumped slightly.  She denies any chest discomfort, neck or arm discomfort.  She has had no new palpitations, presyncope or syncope.  She lives at home with her grandson.  She ambulates in the house and goes out a little bit to go grocery shopping.  She has not had any recent PND or orthopnea.  She has had no weight gain or edema.  The patient exam reveals COR: Regular rate and rhythm, 3 out of 6 holosystolic murmur heard throughout the precordium and radiating to the axilla,  Lungs: Clear to auscultation bilaterally,  Abd: Positive bowel sounds normal previously pitch, bruits, rebound, guarding, Ext 2+ pulses, no edema.    All available labs, radiology testing, previous records reviewed. Agree with documented assessment and plan.   Acute on chronic combined systolic and diastolic HF: The patient seems to be euvolemic now and her creatinine is bumped slightly.  I would agree with reassessing the creatinine in the morning and resuming her previous medications.  She done well otherwise.  We will make sure she has follow-up.  HTN: Her blood pressure is controlled in the context of managing her heart failure.  CKDIIIB:   Creat bumped slightly.  Plan to stop IV diuresis and resume PO in the AM.     MR:  This can be followed in the Advanced HF Clinic.      Elevated trop:  No suggestion of ACS.  Demand ischemia.  No ischemia work up this admission.    Faith Gray  11:22 AM  06/06/2024

## 2024-06-06 NOTE — Progress Notes (Signed)
 PROGRESS NOTE  Faith Gray  FMW:[id number] DOB: Feb 06, 1941 DOA: 06/05/2024 PCP: Loris Elsie PARAS, PA-C   Brief Narrative: Patient is agitated female with history of ischemic cardiomyopathy with EF of 45%, left ventricular aneurysm on Coumadin , coronary artery disease, severe hypertension who presented with cough, increased shortness of breath.  On presentation she looked volume overloaded.  Chest x-ray showed chronic interstitial markings.  She was hypertensive, her proBNP was elevated.  Started on IV Lasix ,now on hold because creatinine trended up. Cardiology also consulted  Assessment & Plan:  Principal Problem:   Chronic combined systolic and diastolic congestive heart failure (HCC) Active Problems:   Chronic kidney disease, stage 3b (HCC)   Long term (current) use of anticoagulants   Acute exacerbation of CHF (congestive heart failure) (HCC)   Acute on chronic HFrEF: Presented with shortness of breath, cough.  Chest x-ray showed features of volume overload, elevated BNP.  Started on IV Lasix .  Takes torsemide  at home. We consulted cardiology.  She follows with Dr. Bensimhon On Coreg , Entresto , spironolactone , Jardiance , Imdur  at home, continued. Echo as per 11/2023 showed EF of 40 to 45%, regional wall motion abnormalities, grade 2 diastolic dysfunction, normal right ventricular systolic function. She had good diuresis last night but it has not been charted.  She received euvolemic  this morning.  Since creatinine trended up, we are holding Lasix  for now.  No shortness of breath, cough or lower extremity edema today.  Lungs are clear to auscultation.  Hypetension: Monitor blood pressure.  Continue current medications.  Continue as needed medications for severe hypertension.  Blood pressure stable this morning  CKD stage 3A: Baseline creatinine 1.3.  Creatinine trended up today.  Lasix  on hold  Hyperkalemia: Treated with lokelma.  Resolved  MGUS: Follows with hematology at  [facility]  Left ventricular aneurysm: On Eliquis   Normocytic anemia: Likely associated with chronic disease, history of MGUS.  Iron  panel done on May 2025 showed optimal iron  level, normal ferritin.  Denies any hematochezia or melena.          DVT prophylaxis:apixaban  (ELIQUIS ) tablet 2.5 mg Start: 06/05/24 2345 apixaban  (ELIQUIS ) tablet 2.5 mg     Code Status: Full Code  Family Communication: Grandson at bedside on 9/19  Patient status: Inpatient  Patient is from : Home  Anticipated discharge to: Home  Estimated DC date: Tomorrow   Consultants: Cardiology  Procedures: None  Antimicrobials:  Anti-infectives (From admission, onward)    None       Subjective: Patient seen and examined at bedside today.  Hemodynamically stable.  Very comfortable this morning.  Denies shortness of breath or cough.  On room air.  No lower extremity edema.  Appears euvolemic.  Objective: Vitals:   06/05/24 1831 06/05/24 2130 06/06/24 0115 06/06/24 0244  BP: (!) 148/97 (!) 145/93 (!) 129/95 (!) 143/98  Pulse: 94 99 88 (!) 46  Resp: 16 20 20 19   Temp: 98.3 F (36.8 C) 98.2 F (36.8 C) 98.3 F (36.8 C) 98.1 F (36.7 C)  TempSrc: Oral   Oral  SpO2: 99% 100% 99% 91%  Weight:    46.7 kg  Height:    4' 11 (1.499 m)   No intake or output data in the 24 hours ending 06/06/24 0808 Filed Weights   06/06/24 0244  Weight: 46.7 kg    Examination:  General exam: Overall comfortable, not in distress,pleasant  elderly female HEENT: PERRL Respiratory system:  no wheezes or crackles  Cardiovascular system: S1 & S2 heard, RRR.  Gastrointestinal system: Abdomen is nondistended, soft and nontender. Central nervous system: Alert and oriented Extremities: No edema, no clubbing ,no cyanosis Skin: No rashes, no ulcers,no icterus     Data Reviewed: I have personally reviewed following labs and imaging studies  CBC: Recent Labs  Lab 06/05/24 2038 06/05/24 2206  WBC 5.6  --   NEUTROABS  3.8  --   HGB 8.0* 8.5*  HCT 25.3* 25.0*  MCV 86.6  --   PLT 312  --    Basic Metabolic Panel: Recent Labs  Lab 06/05/24 2200 06/05/24 2206  NA 140 140  K 4.7 5.3*  CL 106 112*  CO2 18*  --   GLUCOSE 78 77  BUN 22 28*  CREATININE 1.41* 1.60*  CALCIUM  9.3  --      Recent Results (from the past 240 hours)  Blood culture (routine x 2)     Status: None (Preliminary result)   Collection Time: 06/05/24  6:53 PM   Specimen: BLOOD LEFT FOREARM  Result Value Ref Range Status   Specimen Description   Final    BLOOD LEFT FOREARM Performed at [facility], [address] [place], [place] [address]    Special Requests   Final    Blood Culture adequate volume BOTTLES DRAWN AEROBIC AND ANAEROBIC Performed at [facility], [address], [place], [place] [address]    Culture PENDING  Incomplete   Report Status PENDING  Incomplete  Resp panel by RT-PCR (RSV, Flu A&B, Covid) Anterior Nasal Swab     Status: None   Collection Time: 06/05/24  8:39 PM   Specimen: Anterior Nasal Swab  Result Value Ref Range Status   SARS Coronavirus 2 by RT PCR NEGATIVE NEGATIVE Final    Comment: (NOTE) SARS-CoV-2 target nucleic acids are NOT DETECTED.  The SARS-CoV-2 RNA is generally detectable in upper respiratory specimens during the acute phase of infection. The lowest concentration of SARS-CoV-2 viral copies this assay can detect is 138 copies/mL. A negative result does not preclude SARS-Cov-2 infection and should not be used as the sole basis for treatment or other patient management decisions. A negative result may occur with  improper specimen collection/handling, submission of specimen other than nasopharyngeal swab, presence of viral mutation(s) within the areas targeted by this assay, and inadequate number of viral copies(<138 copies/mL). A negative result must be combined with clinical observations, patient history, and epidemiological information. The expected  result is Negative.  Fact Sheet for Patients:  BloggerCourse.com  Fact Sheet for Healthcare Providers:  SeriousBroker.it  This test is no t yet approved or cleared by the [place]  FDA and  has been authorized for detection and/or diagnosis of SARS-CoV-2 by FDA under an Emergency Use Authorization (EUA). This EUA will remain  in effect (meaning this test can be used) for the duration of the COVID-19 declaration under Section 564(b)(1) of the Act, 21 U.S.C.section 360bbb-3(b)(1), unless the authorization is terminated  or revoked sooner.       Influenza A by PCR NEGATIVE NEGATIVE Final   Influenza B by PCR NEGATIVE NEGATIVE Final    Comment: (NOTE) The Xpert Xpress SARS-CoV-2/FLU/RSV plus assay is intended as an aid in the diagnosis of influenza from Nasopharyngeal swab specimens and should not be used as a sole basis for treatment. Nasal washings and aspirates are unacceptable for Xpert Xpress SARS-CoV-2/FLU/RSV testing.  Fact Sheet for Patients: BloggerCourse.com  Fact Sheet for Healthcare Providers: SeriousBroker.it  This test is not yet approved or  cleared by the [place]  FDA and has been authorized for detection and/or diagnosis of SARS-CoV-2 by FDA under an Emergency Use Authorization (EUA). This EUA will remain in effect (meaning this test can be used) for the duration of the COVID-19 declaration under Section 564(b)(1) of the Act, 21 U.S.C. section 360bbb-3(b)(1), unless the authorization is terminated or revoked.     Resp Syncytial Virus by PCR NEGATIVE NEGATIVE Final    Comment: (NOTE) Fact Sheet for Patients: BloggerCourse.com  Fact Sheet for Healthcare Providers: SeriousBroker.it  This test is not yet approved or cleared by the [place]  FDA and has been authorized for detection and/or diagnosis of  SARS-CoV-2 by FDA under an Emergency Use Authorization (EUA). This EUA will remain in effect (meaning this test can be used) for the duration of the COVID-19 declaration under Section 564(b)(1) of the Act, 21 U.S.C. section 360bbb-3(b)(1), unless the authorization is terminated or revoked.  Performed at [facility], [address] [place], [place] [address]      Radiology Studies: DG Chest 2 View Result Date: 06/05/2024 EXAM: 2 VIEW(S) XRAY OF THE CHEST 06/05/2024 11:50:16 AM COMPARISON: 12/13/2023 CLINICAL HISTORY: Shortness of breath, cough, h/o CHF. Pt reports productive cough, nasal congestion, and constant shortness of breath x2 days. Cold symptoms started yesterday and SOB became more noticeable today. SOB even at rest. Pulse oximetry 90-95% in triage. Tachypneic. Pt has heart failure - last exacerbation March 2025. Coricidin taken at home with little relief. Denies fevers and chills. Pt also notes mild sore throat today. FINDINGS: LUNGS AND PLEURA: Chronic interstitial markings without acute opacity. No pleural effusion. No pneumothorax. HEART AND MEDIASTINUM: Atherosclerotic aortic calcifications. No acute abnormality of the cardiac and mediastinal silhouettes. BONES AND SOFT TISSUES: No acute osseous abnormality. IMPRESSION: 1. No acute findings. 2. Chronic interstitial markings without acute opacity. Electronically signed by: Donnice Mania MD 06/05/2024 01:28 PM EDT RP Workstation: [id number]    Scheduled Meds:  apixaban   2.5 mg Oral BID   carvedilol   25 mg Oral BID WC   empagliflozin   10 mg Oral Daily   ezetimibe   10 mg Oral QPM   furosemide   40 mg Intravenous BID   sacubitril -valsartan   1 tablet Oral BID   sodium chloride  flush  3 mL Intravenous Q12H   spironolactone   25 mg Oral QPM   Continuous Infusions:   LOS: 1 day   Ivonne Mustache, MD [facility] P9/19/2025, 8:08 AM

## 2024-06-06 NOTE — Care Management CC44 (Signed)
 Condition Code 44 Documentation Completed  Patient Details  Name: Faith Gray MRN: [id number] Date of Birth: 07/22/1941   Condition Code 44 given:  Yes Patient signature on Condition Code 44 notice:  Yes Documentation of 2 MD's agreement:  Yes Code 44 added to claim:  Yes    [name], LCSW 06/06/2024, 3:53 PM

## 2024-06-06 NOTE — Care Management Obs Status (Signed)
 MEDICARE OBSERVATION STATUS NOTIFICATION   Patient Details  Name: Faith Gray MRN: [id number] Date of Birth: June 05, 1941   Medicare Observation Status Notification Given:  Yes    [name], LCSW 06/06/2024, 3:53 PM

## 2024-06-07 DIAGNOSIS — I5042 Chronic combined systolic (congestive) and diastolic (congestive) heart failure: Secondary | ICD-10-CM

## 2024-06-07 DIAGNOSIS — N1832 Chronic kidney disease, stage 3b: Secondary | ICD-10-CM | POA: Diagnosis not present

## 2024-06-07 LAB — BASIC METABOLIC PANEL WITH GFR
Anion gap: 13 (ref 5–15)
BUN: 29 mg/dL — ABNORMAL HIGH (ref 8–23)
CO2: 22 mmol/L (ref 22–32)
Calcium: 9 mg/dL (ref 8.9–10.3)
Chloride: 102 mmol/L (ref 98–111)
Creatinine, Ser: 1.83 mg/dL — ABNORMAL HIGH (ref 0.44–1.00)
GFR, Estimated: 27 mL/min — ABNORMAL LOW (ref >60)
Glucose, Bld: 124 mg/dL — ABNORMAL HIGH (ref 70–99)
Potassium: 4.8 mmol/L (ref 3.5–5.1)
Sodium: 137 mmol/L (ref 135–145)

## 2024-06-07 NOTE — Progress Notes (Signed)
 DAILY PROGRESS NOTE   Patient Name: Faith Gray Date of Encounter: 06/07/2024 Cardiologist: None  Chief Complaint   No shortness of breath  Patient Profile   Faith Gray is a 83 y.o. female with a history of CAD noted on cardiac catheterization in 08/2012 (treated medically), ischemic cardiomyopathy/ chronic HFrEF with EF of 40-45% on last Echo in 11/2023, LV aneurysm on Eliquis , moderate to severe mitral regurgitation, hypertension, anemia, and MGUS who is being seen 06/06/2024 for the evaluation of acute on chronic CHF at the request of Dr. Jillian.   Subjective   No issues overnight- noted to be in sinus rhythm with short 4 beat run of NSVT. No I's/O's recorded. Felt to be euvolemic yesterday. Creatinine up to 1.83 today. ProBNP 12953 (up from 8968 the day prior). BP noted to be soft today- not on home HF meds except jardiance . Diuretics held.  Objective   Vitals:   06/06/24 1517 06/06/24 2140 06/07/24 0610 06/07/24 1007  BP: (!) 107/96 94/62 (!) 99/59 102/71  Pulse: 74 66 (!) 52 65  Resp: 15 20 20    Temp: 98.7 F (37.1 C) 98.6 F (37 C) 97.6 F (36.4 C)   TempSrc: Oral Oral Oral   SpO2: 100% 100% 100% 100%  Weight:      Height:       No intake or output data in the 24 hours ending 06/07/24 1030 Filed Weights   06/06/24 0244  Weight: 46.7 kg    Physical Exam   General appearance: alert and no distress Neck: no carotid bruit, no JVD, and thyroid not enlarged, symmetric, no tenderness/mass/nodules Lungs: clear to auscultation bilaterally Heart: regular rate and rhythm, S1, S2 normal, and systolic murmur: holosystolic 3/6, blowing at apex Extremities: extremities normal, atraumatic, no cyanosis or edema Neurologic: Grossly normal  Inpatient Medications    Scheduled Meds:  apixaban   2.5 mg Oral BID   atorvastatin   80 mg Oral q1800   empagliflozin   10 mg Oral Daily   ezetimibe   10 mg Oral QPM   sodium chloride  flush  3 mL Intravenous Q12H    Continuous  Infusions:   PRN Meds: acetaminophen  **OR** acetaminophen , guaiFENesin -dextromethorphan , hydrALAZINE , melatonin, ondansetron  **OR** ondansetron  (ZOFRAN ) IV, senna-docusate   Labs   Results for orders placed or performed during the hospital encounter of 06/05/24 (from the past 48 hours)  Blood culture (routine x 2)     Status: None (Preliminary result)   Collection Time: 06/05/24  6:53 PM   Specimen: BLOOD LEFT FOREARM  Result Value Ref Range   Specimen Description      BLOOD LEFT FOREARM Performed at [facility], [address], [place], [place] [address]    Special Requests      Blood Culture adequate volume BOTTLES DRAWN AEROBIC AND ANAEROBIC Performed at [facility], [address] [place], [place] [address]    Culture      NO GROWTH 2 DAYS Performed at [facility], [address], [place], [place] [address]    Report Status PENDING   CBC with Differential     Status: Abnormal   Collection Time: 06/05/24  8:38 PM  Result Value Ref Range   WBC 5.6 4.0 - 10.5 K/uL   RBC 2.92 (L) 3.87 - 5.11 MIL/uL   Hemoglobin 8.0 (L) 12.0 - 15.0 g/dL   HCT 74.6 (L) 63.9 - 53.9 %   MCV 86.6 80.0 - 100.0 fL   MCH 27.4 26.0 - 34.0 pg  MCHC 31.6 30.0 - 36.0 g/dL   RDW 86.0 88.4 - 84.4 %   Platelets 312 150 - 400 K/uL   nRBC 0.0 0.0 - 0.2 %   Neutrophils Relative % 68 %   Neutro Abs 3.8 1.7 - 7.7 K/uL   Lymphocytes Relative 26 %   Lymphs Abs 1.4 0.7 - 4.0 K/uL   Monocytes Relative 5 %   Monocytes Absolute 0.3 0.1 - 1.0 K/uL   Eosinophils Relative 1 %   Eosinophils Absolute 0.1 0.0 - 0.5 K/uL   Basophils Relative 0 %   Basophils Absolute 0.0 0.0 - 0.1 K/uL   Immature Granulocytes 0 %   Abs Immature Granulocytes 0.01 0.00 - 0.07 K/uL    Comment: Performed at [facility], [address] [place], [place] [address]  Pro Brain natriuretic peptide     Status: Abnormal   Collection Time: 06/05/24  8:38 PM  Result Value Ref Range    Pro Brain Natriuretic Peptide 8,968.0 (H) <300.0 pg/mL    Comment: (NOTE) Age Group        Cut-Points    Interpretation  < 50 years     450 pg/mL       NT-proBNP > 450 pg/mL indicates                                ADHF is likely              50 to 75 years  900 pg/mL      NT-proBNP > 900 pg/mL indicates          ADHF is likely  > 75 years      1800 pg/mL     NT-proBNP > 1800 pg/mL indicates          ADHF is likely                           All ages    Results between       Indeterminate. Further clinical             300 and the cut-   information is needed to determine            point for age group   if ADHF is present.                                                             Elecsys proBNP II/ Elecsys proBNP II STAT           Cut-Point                       Interpretation  300 pg/mL                    NT-proBNP <300pg/mL indicates                             ADHF is not likely  Performed at [facility], [address] [place], [place] [address]   Resp panel by RT-PCR (RSV, Flu A&B, Covid) Anterior Nasal Swab     Status: None  Collection Time: 06/05/24  8:39 PM   Specimen: Anterior Nasal Swab  Result Value Ref Range   SARS Coronavirus 2 by RT PCR NEGATIVE NEGATIVE    Comment: (NOTE) SARS-CoV-2 target nucleic acids are NOT DETECTED.  The SARS-CoV-2 RNA is generally detectable in upper respiratory specimens during the acute phase of infection. The lowest concentration of SARS-CoV-2 viral copies this assay can detect is 138 copies/mL. A negative result does not preclude SARS-Cov-2 infection and should not be used as the sole basis for treatment or other patient management decisions. A negative result may occur with  improper specimen collection/handling, submission of specimen other than nasopharyngeal swab, presence of viral mutation(s) within the areas targeted by this assay, and inadequate number of viral copies(<138 copies/mL). A negative  result must be combined with clinical observations, patient history, and epidemiological information. The expected result is Negative.  Fact Sheet for Patients:  BloggerCourse.com  Fact Sheet for Healthcare Providers:  SeriousBroker.it  This test is no t yet approved or cleared by the [place]  FDA and  has been authorized for detection and/or diagnosis of SARS-CoV-2 by FDA under an Emergency Use Authorization (EUA). This EUA will remain  in effect (meaning this test can be used) for the duration of the COVID-19 declaration under Section 564(b)(1) of the Act, 21 U.S.C.section 360bbb-3(b)(1), unless the authorization is terminated  or revoked sooner.       Influenza A by PCR NEGATIVE NEGATIVE   Influenza B by PCR NEGATIVE NEGATIVE    Comment: (NOTE) The Xpert Xpress SARS-CoV-2/FLU/RSV plus assay is intended as an aid in the diagnosis of influenza from Nasopharyngeal swab specimens and should not be used as a sole basis for treatment. Nasal washings and aspirates are unacceptable for Xpert Xpress SARS-CoV-2/FLU/RSV testing.  Fact Sheet for Patients: BloggerCourse.com  Fact Sheet for Healthcare Providers: SeriousBroker.it  This test is not yet approved or cleared by the [place]  FDA and has been authorized for detection and/or diagnosis of SARS-CoV-2 by FDA under an Emergency Use Authorization (EUA). This EUA will remain in effect (meaning this test can be used) for the duration of the COVID-19 declaration under Section 564(b)(1) of the Act, 21 U.S.C. section 360bbb-3(b)(1), unless the authorization is terminated or revoked.     Resp Syncytial Virus by PCR NEGATIVE NEGATIVE    Comment: (NOTE) Fact Sheet for Patients: BloggerCourse.com  Fact Sheet for Healthcare Providers: SeriousBroker.it  This test is not yet  approved or cleared by the [place]  FDA and has been authorized for detection and/or diagnosis of SARS-CoV-2 by FDA under an Emergency Use Authorization (EUA). This EUA will remain in effect (meaning this test can be used) for the duration of the COVID-19 declaration under Section 564(b)(1) of the Act, 21 U.S.C. section 360bbb-3(b)(1), unless the authorization is terminated or revoked.  Performed at [facility], [address] [place], [place] [address]   I-Stat CG4 Lactic Acid     Status: None   Collection Time: 06/05/24  8:39 PM  Result Value Ref Range   Lactic Acid, Venous 1.1 0.5 - 1.9 mmol/L  Blood culture (routine x 2)     Status: None (Preliminary result)   Collection Time: 06/05/24  8:51 PM   Specimen: BLOOD  Result Value Ref Range   Specimen Description      BLOOD LEFT ANTECUBITAL Performed at [facility], [address] [place], [place] [address]    Special Requests      Blood Culture adequate volume  BOTTLES DRAWN AEROBIC AND ANAEROBIC Performed at [facility], [address], [place], [place] [address]    Culture      NO GROWTH 2 DAYS Performed at [facility], [address], [place], [place] [address]    Report Status PENDING   Basic metabolic panel with GFR     Status: Abnormal   Collection Time: 06/05/24 10:00 PM  Result Value Ref Range   Sodium 140 135 - 145 mmol/L   Potassium 4.7 3.5 - 5.1 mmol/L    Comment: Delta check noted    Chloride 106 98 - 111 mmol/L   CO2 18 (L) 22 - 32 mmol/L   Glucose, Bld 78 70 - 99 mg/dL    Comment: Glucose reference range applies only to samples taken after fasting for at least 8 hours.   BUN 22 8 - 23 mg/dL   Creatinine, Ser 8.58 (H) 0.44 - 1.00 mg/dL   Calcium  9.3 8.9 - 10.3 mg/dL   GFR, Estimated 37 (L) >60 mL/min    Comment: (NOTE) Calculated using the CKD-EPI Creatinine Equation (2021)    Anion gap 16 (H) 5 - 15    Comment: Performed at [facility], [address] [place], [place] [address]  I-Stat CG4 Lactic Acid     Status: None   Collection Time: 06/05/24 10:06 PM  Result Value Ref Range   Lactic Acid, Venous 1.0 0.5 - 1.9 mmol/L  I-stat chem 8, ED (not at [facility], [facility] or [facility])     Status: Abnormal   Collection Time: 06/05/24 10:06 PM  Result Value Ref Range   Sodium 140 135 - 145 mmol/L   Potassium 5.3 (H) 3.5 - 5.1 mmol/L   Chloride 112 (H) 98 - 111 mmol/L   BUN 28 (H) 8 - 23 mg/dL   Creatinine, Ser 8.39 (H) 0.44 - 1.00 mg/dL   Glucose, Bld 77 70 - 99 mg/dL    Comment: Glucose reference range applies only to samples taken after fasting for at least 8 hours.   Calcium , Ion 1.09 (L) 1.15 - 1.40 mmol/L   TCO2 20 (L) 22 - 32 mmol/L   Hemoglobin 8.5 (L) 12.0 - 15.0 g/dL   HCT 74.9 (L) 63.9 - 53.9 %  Troponin T, High Sensitivity     Status: Abnormal   Collection Time: 06/05/24 10:22 PM  Result Value Ref Range   Troponin T High Sensitivity 35 (H) 0 - 19 ng/L    Comment: (NOTE) Biotin concentrations > 1000 ng/mL falsely decrease TnT results.  Serial cardiac troponin measurements are suggested.  Refer to the Links section for chest pain algorithms and additional  guidance. Performed at [facility], [address], [place], [place] [address]   Basic metabolic panel     Status: Abnormal   Collection Time: 06/06/24  8:37 AM  Result Value Ref Range   Sodium 139 135 - 145 mmol/L   Potassium 4.1 3.5 - 5.1 mmol/L   Chloride 104 98 - 111 mmol/L   CO2 21 (L) 22 - 32 mmol/L   Glucose, Bld 94 70 - 99 mg/dL    Comment: Glucose reference range applies only to samples taken after fasting for at least 8 hours.   BUN 20 8 - 23 mg/dL   Creatinine, Ser 8.37 (H) 0.44 - 1.00 mg/dL   Calcium  9.4 8.9 - 10.3 mg/dL   GFR, Estimated 31 (L) >60 mL/min    Comment: (NOTE) Calculated using the  CKD-EPI Creatinine Equation (2021)    Anion gap 15 5 - 15    Comment: Performed at [facility], [address], [place], [place] [address]  CBC     Status: Abnormal   Collection Time: 06/06/24  8:37 AM  Result Value Ref Range   WBC 4.5 4.0 - 10.5 K/uL   RBC 2.79 (L) 3.87 - 5.11 MIL/uL   Hemoglobin 7.5 (L) 12.0 - 15.0 g/dL   HCT 76.0 (L) 63.9 - 53.9 %   MCV 85.7 80.0 - 100.0 fL   MCH 26.9 26.0 - 34.0 pg   MCHC 31.4 30.0 - 36.0 g/dL   RDW 86.2 88.4 - 84.4 %   Platelets 296 150 - 400 K/uL   nRBC 0.0 0.0 - 0.2 %    Comment: Performed at [facility], [address], [place], [place] [address]  Magnesium     Status: None   Collection Time: 06/06/24  8:37 AM  Result Value Ref Range   Magnesium 2.4 1.7 - 2.4 mg/dL    Comment: Performed at [facility], [address], [place], [place] [address]  Pro Brain natriuretic peptide     Status: Abnormal   Collection Time: 06/06/24  8:37 AM  Result Value Ref Range   Pro Brain Natriuretic Peptide 12,953.0 (H) <300.0 pg/mL    Comment: (NOTE) Age Group        Cut-Points    Interpretation  < 50 years     450 pg/mL       NT-proBNP > 450 pg/mL indicates                                ADHF is likely              50 to 75 years  900 pg/mL      NT-proBNP > 900 pg/mL indicates          ADHF is likely  > 75 years      1800 pg/mL     NT-proBNP > 1800 pg/mL indicates          ADHF is likely                           All ages    Results between       Indeterminate. Further clinical             300 and the cut-   information is needed to determine            point for age group   if ADHF is present.                                                             Elecsys proBNP II/ Elecsys proBNP II STAT           Cut-Point                       Interpretation  300 pg/mL                    NT-proBNP <300pg/mL indicates  ADHF is not likely  Performed at [facility], [address], [place], [place] [address]   Basic metabolic panel with GFR      Status: Abnormal   Collection Time: 06/07/24  5:39 AM  Result Value Ref Range   Sodium 137 135 - 145 mmol/L   Potassium 4.8 3.5 - 5.1 mmol/L   Chloride 102 98 - 111 mmol/L   CO2 22 22 - 32 mmol/L   Glucose, Bld 124 (H) 70 - 99 mg/dL    Comment: Glucose reference range applies only to samples taken after fasting for at least 8 hours.   BUN 29 (H) 8 - 23 mg/dL   Creatinine, Ser 8.16 (H) 0.44 - 1.00 mg/dL   Calcium  9.0 8.9 - 10.3 mg/dL   GFR, Estimated 27 (L) >60 mL/min    Comment: (NOTE) Calculated using the CKD-EPI Creatinine Equation (2021)    Anion gap 13 5 - 15    Comment: Performed at [facility], [address], [place], [place] [address]    ECG   N/A  Telemetry   Sinus brady with 4 beat run of NSVT - Personally Reviewed  Radiology    DG Chest 2 View Result Date: 06/05/2024 EXAM: 2 VIEW(S) XRAY OF THE CHEST 06/05/2024 11:50:16 AM COMPARISON: 12/13/2023 CLINICAL HISTORY: Shortness of breath, cough, h/o CHF. Pt reports productive cough, nasal congestion, and constant shortness of breath x2 days. Cold symptoms started yesterday and SOB became more noticeable today. SOB even at rest. Pulse oximetry 90-95% in triage. Tachypneic. Pt has heart failure - last exacerbation March 2025. Coricidin taken at home with little relief. Denies fevers and chills. Pt also notes mild sore throat today. FINDINGS: LUNGS AND PLEURA: Chronic interstitial markings without acute opacity. No pleural effusion. No pneumothorax. HEART AND MEDIASTINUM: Atherosclerotic aortic calcifications. No acute abnormality of the cardiac and mediastinal silhouettes. BONES AND SOFT TISSUES: No acute osseous abnormality. IMPRESSION: 1. No acute findings. 2. Chronic interstitial markings without acute opacity. Electronically signed by: Donnice Mania MD 06/05/2024 01:28 PM EDT RP Workstation: [id number]    Cardiac Studies   N/A  Assessment   Principal Problem:   Chronic combined systolic and  diastolic congestive heart failure (HCC) Active Problems:   Long term (current) use of anticoagulants   Chronic kidney disease, stage 3b (HCC)   Acute exacerbation of CHF (congestive heart failure) (HCC)   Plan   Breathing is better today, but some coughing spells. Despite increase in pro-BNP, she appears euvolemic, if not dry today. This may explain jump in creatinine and hypotension. Hold off on diuretics and BP/CHF meds. Encouraged po hydration today. If renal function does not improve, may need to consider RHC next week.   Time Spent Directly with Patient:  I have spent a total of 25 minutes with the patient reviewing hospital notes, telemetry, EKGs, labs and examining the patient as well as establishing an assessment and plan that was discussed personally with the patient.  > 50% of time was spent in direct patient care.  Length of Stay:  LOS: 1 day   Vinie KYM Maxcy, MD, [facility], FNLA, FACP  [place]  [facility]  Medical Director of the Advanced Lipid Disorders &  Cardiovascular Risk Reduction Clinic Diplomate of the American Board of Clinical Lipidology Attending Cardiologist  Direct Dial: [phone]  Fax: [phone]  Website:  [email or web address]  Vinie BROCKS Russie Gulledge 06/07/2024, 10:30 AM

## 2024-06-07 NOTE — Progress Notes (Signed)
 PROGRESS NOTE  Faith Gray  FMW:[id number] DOB: 1941-05-11 DOA: 06/05/2024 PCP: Loris Elsie PARAS, PA-C   Brief Narrative: Patient is agitated female with history of ischemic cardiomyopathy with EF of 45%, left ventricular aneurysm on Coumadin , coronary artery disease, severe hypertension who presented with cough, increased shortness of breath.  On presentation she looked volume overloaded.  Chest x-ray showed chronic interstitial markings.  She was hypertensive, her proBNP was elevated.  Started on IV Lasix ,now on hold because creatinine trended up. Cardiology also consulted and following.  Blood pressure soft so cardiac medications on hold  Assessment & Plan:  Principal Problem:   Chronic combined systolic and diastolic congestive heart failure (HCC) Active Problems:   Chronic kidney disease, stage 3b (HCC)   Long term (current) use of anticoagulants   Acute exacerbation of CHF (congestive heart failure) (HCC)   Acute on chronic HFrEF: Presented with shortness of breath, cough.  Chest x-ray showed features of volume overload, elevated BNP.  Given IV Lasix .  Takes torsemide  at home. We consulted cardiology.  She follows with Dr. Bensimhon On Coreg , Entresto , spironolactone , Jardiance , Imdur  at home. Echo as per 11/2023 showed EF of 40 to 45%, regional wall motion abnormalities, grade 2 diastolic dysfunction, normal right ventricular systolic function.  Since creatinine trended up, we are holding Lasix  for now.  No shortness of breath, cough or lower extremity edema today.  Lungs are clear to auscultation. Cardiac medications on hold due to low blood pressure  Hypetension: Has been hypotensive.  Antihypertensives on hold  CKD stage 3A: Baseline creatinine 1.3.  Creatinine trended up today to the range of 1.8.  Lasix  on hold, all antihypertensive/cardiac meds on hold.  We encouraged her to increase her oral intake  Hyperkalemia: Treated with lokelma.  Resolved  MGUS: Follows with  hematology at [facility]  Left ventricular aneurysm: On Eliquis   Normocytic anemia: Likely associated with chronic disease, history of MGUS.  Iron  panel done on May 2025 showed optimal iron  level, normal ferritin.  Denies any hematochezia or melena.  Hemoglobin at baseline          DVT prophylaxis:apixaban  (ELIQUIS ) tablet 2.5 mg Start: 06/05/24 2345 apixaban  (ELIQUIS ) tablet 2.5 mg     Code Status: Full Code  Family Communication: Grandson at bedside on 9/19  Patient status: Inpatient  Patient is from : Home  Anticipated discharge to: Home  Estimated DC date: 1 to 2 days   Consultants: Cardiology  Procedures: None  Antimicrobials:  Anti-infectives (From admission, onward)    None       Subjective: Patient seen and examined at bedside today.  Comfortable, lying in bed.  Any shortness of breath or cough.  No lower extremity edema.  Lungs are clear to auscultation.  Complains of some weakness today.  Most likely from soft blood pressure  Objective: Vitals:   06/06/24 1517 06/06/24 2140 06/07/24 0610 06/07/24 1007  BP: (!) 107/96 94/62 (!) 99/59 102/71  Pulse: 74 66 (!) 52 65  Resp: 15 20 20    Temp: 98.7 F (37.1 C) 98.6 F (37 C) 97.6 F (36.4 C)   TempSrc: Oral Oral Oral   SpO2: 100% 100% 100% 100%  Weight:      Height:       No intake or output data in the 24 hours ending 06/07/24 1108 Filed Weights   06/06/24 0244  Weight: 46.7 kg    Examination:  General exam: Overall comfortable, not in distress, pleasant elderly  female HEENT: PERRL Respiratory system:  no  wheezes or crackles  Cardiovascular system: S1 & S2 heard, RRR.  Gastrointestinal system: Abdomen is nondistended, soft and nontender. Central nervous system: Alert and oriented Extremities: No edema, no clubbing ,no cyanosis Skin: No rashes, no ulcers,no icterus     Data Reviewed: I have personally reviewed following labs and imaging studies  CBC: Recent Labs  Lab 06/05/24 2038  06/05/24 2206 06/06/24 0837  WBC 5.6  --  4.5  NEUTROABS 3.8  --   --   HGB 8.0* 8.5* 7.5*  HCT 25.3* 25.0* 23.9*  MCV 86.6  --  85.7  PLT 312  --  296   Basic Metabolic Panel: Recent Labs  Lab 06/05/24 2200 06/05/24 2206 06/06/24 0837 06/07/24 0539  NA 140 140 139 137  K 4.7 5.3* 4.1 4.8  CL 106 112* 104 102  CO2 18*  --  21* 22  GLUCOSE 78 77 94 124*  BUN 22 28* 20 29*  CREATININE 1.41* 1.60* 1.62* 1.83*  CALCIUM  9.3  --  9.4 9.0  MG  --   --  2.4  --      Recent Results (from the past 240 hours)  Blood culture (routine x 2)     Status: None (Preliminary result)   Collection Time: 06/05/24  6:53 PM   Specimen: BLOOD LEFT FOREARM  Result Value Ref Range Status   Specimen Description   Final    BLOOD LEFT FOREARM Performed at [facility], [address], [place], [place] [address]    Special Requests   Final    Blood Culture adequate volume BOTTLES DRAWN AEROBIC AND ANAEROBIC Performed at [facility], [address], [place], [place] [address]    Culture   Final    NO GROWTH 2 DAYS Performed at [facility], [address] [place], [place] [address]    Report Status PENDING  Incomplete  Resp panel by RT-PCR (RSV, Flu A&B, Covid) Anterior Nasal Swab     Status: None   Collection Time: 06/05/24  8:39 PM   Specimen: Anterior Nasal Swab  Result Value Ref Range Status   SARS Coronavirus 2 by RT PCR NEGATIVE NEGATIVE Final    Comment: (NOTE) SARS-CoV-2 target nucleic acids are NOT DETECTED.  The SARS-CoV-2 RNA is generally detectable in upper respiratory specimens during the acute phase of infection. The lowest concentration of SARS-CoV-2 viral copies this assay can detect is 138 copies/mL. A negative result does not preclude SARS-Cov-2 infection and should not be used as the sole basis for treatment or other patient management decisions. A negative result may occur with  improper specimen collection/handling, submission of  specimen other than nasopharyngeal swab, presence of viral mutation(s) within the areas targeted by this assay, and inadequate number of viral copies(<138 copies/mL). A negative result must be combined with clinical observations, patient history, and epidemiological information. The expected result is Negative.  Fact Sheet for Patients:  BloggerCourse.com  Fact Sheet for Healthcare Providers:  SeriousBroker.it  This test is no t yet approved or cleared by the [place]  FDA and  has been authorized for detection and/or diagnosis of SARS-CoV-2 by FDA under an Emergency Use Authorization (EUA). This EUA will remain  in effect (meaning this test can be used) for the duration of the COVID-19 declaration under Section 564(b)(1) of the Act, 21 U.S.C.section 360bbb-3(b)(1), unless the authorization is terminated  or revoked sooner.       Influenza A by PCR NEGATIVE NEGATIVE Final  Influenza B by PCR NEGATIVE NEGATIVE Final    Comment: (NOTE) The Xpert Xpress SARS-CoV-2/FLU/RSV plus assay is intended as an aid in the diagnosis of influenza from Nasopharyngeal swab specimens and should not be used as a sole basis for treatment. Nasal washings and aspirates are unacceptable for Xpert Xpress SARS-CoV-2/FLU/RSV testing.  Fact Sheet for Patients: BloggerCourse.com  Fact Sheet for Healthcare Providers: SeriousBroker.it  This test is not yet approved or cleared by the [place]  FDA and has been authorized for detection and/or diagnosis of SARS-CoV-2 by FDA under an Emergency Use Authorization (EUA). This EUA will remain in effect (meaning this test can be used) for the duration of the COVID-19 declaration under Section 564(b)(1) of the Act, 21 U.S.C. section 360bbb-3(b)(1), unless the authorization is terminated or revoked.     Resp Syncytial Virus by PCR NEGATIVE NEGATIVE Final     Comment: (NOTE) Fact Sheet for Patients: BloggerCourse.com  Fact Sheet for Healthcare Providers: SeriousBroker.it  This test is not yet approved or cleared by the [place]  FDA and has been authorized for detection and/or diagnosis of SARS-CoV-2 by FDA under an Emergency Use Authorization (EUA). This EUA will remain in effect (meaning this test can be used) for the duration of the COVID-19 declaration under Section 564(b)(1) of the Act, 21 U.S.C. section 360bbb-3(b)(1), unless the authorization is terminated or revoked.  Performed at [facility], [address] [place], [place] [address]   Blood culture (routine x 2)     Status: None (Preliminary result)   Collection Time: 06/05/24  8:51 PM   Specimen: BLOOD  Result Value Ref Range Status   Specimen Description   Final    BLOOD LEFT ANTECUBITAL Performed at [facility], [address], [place], [place] [address]    Special Requests   Final    Blood Culture adequate volume BOTTLES DRAWN AEROBIC AND ANAEROBIC Performed at [facility], [address] [place], [place] [address]    Culture   Final    NO GROWTH 2 DAYS Performed at [facility], [address], [place], [place] [address]    Report Status PENDING  Incomplete     Radiology Studies: DG Chest 2 View Result Date: 06/05/2024 EXAM: 2 VIEW(S) XRAY OF THE CHEST 06/05/2024 11:50:16 AM COMPARISON: 12/13/2023 CLINICAL HISTORY: Shortness of breath, cough, h/o CHF. Pt reports productive cough, nasal congestion, and constant shortness of breath x2 days. Cold symptoms started yesterday and SOB became more noticeable today. SOB even at rest. Pulse oximetry 90-95% in triage. Tachypneic. Pt has heart failure - last exacerbation March 2025. Coricidin taken at home with little relief. Denies fevers and chills. Pt also notes mild sore throat today. FINDINGS: LUNGS  AND PLEURA: Chronic interstitial markings without acute opacity. No pleural effusion. No pneumothorax. HEART AND MEDIASTINUM: Atherosclerotic aortic calcifications. No acute abnormality of the cardiac and mediastinal silhouettes. BONES AND SOFT TISSUES: No acute osseous abnormality. IMPRESSION: 1. No acute findings. 2. Chronic interstitial markings without acute opacity. Electronically signed by: Donnice Mania MD 06/05/2024 01:28 PM EDT RP Workstation: [id number]    Scheduled Meds:  apixaban   2.5 mg Oral BID   atorvastatin   80 mg Oral q1800   empagliflozin   10 mg Oral Daily   ezetimibe   10 mg Oral QPM   sodium chloride  flush  3 mL Intravenous Q12H   Continuous Infusions:   LOS: 1 day   Ivonne Mustache, MD [facility] P9/20/2025, 11:08 AM

## 2024-06-08 ENCOUNTER — Observation Stay (HOSPITAL_BASED_OUTPATIENT_CLINIC_OR_DEPARTMENT_OTHER)

## 2024-06-08 DIAGNOSIS — I5023 Acute on chronic systolic (congestive) heart failure: Secondary | ICD-10-CM

## 2024-06-08 DIAGNOSIS — I5042 Chronic combined systolic (congestive) and diastolic (congestive) heart failure: Secondary | ICD-10-CM | POA: Diagnosis not present

## 2024-06-08 DIAGNOSIS — N1832 Chronic kidney disease, stage 3b: Secondary | ICD-10-CM | POA: Diagnosis not present

## 2024-06-08 LAB — ECHOCARDIOGRAM COMPLETE
AR max vel: 0.63 cm2
AV Area VTI: 0.52 cm2
AV Area mean vel: 0.65 cm2
AV Mean grad: 5 mmHg
AV Peak grad: 10.1 mmHg
Ao pk vel: 1.59 m/s
Area-P 1/2: 1.79 cm2
Calc EF: 41.1 %
Height: 59 in
MV M vel: 4.5 m/s
MV Peak grad: 81 mmHg
MV VTI: 0.37 cm2
Radius: 0.55 cm
S' Lateral: 4.1 cm
Single Plane A2C EF: 37.1 %
Single Plane A4C EF: 45.6 %
Weight: 1648 [oz_av]

## 2024-06-08 LAB — BASIC METABOLIC PANEL WITH GFR
Anion gap: 14 (ref 5–15)
BUN: 40 mg/dL — ABNORMAL HIGH (ref 8–23)
CO2: 20 mmol/L — ABNORMAL LOW (ref 22–32)
Calcium: 8.8 mg/dL — ABNORMAL LOW (ref 8.9–10.3)
Chloride: 102 mmol/L (ref 98–111)
Creatinine, Ser: 1.82 mg/dL — ABNORMAL HIGH (ref 0.44–1.00)
GFR, Estimated: 27 mL/min — ABNORMAL LOW (ref >60)
Glucose, Bld: 101 mg/dL — ABNORMAL HIGH (ref 70–99)
Potassium: 4.8 mmol/L (ref 3.5–5.1)
Sodium: 135 mmol/L (ref 135–145)

## 2024-06-08 NOTE — Progress Notes (Signed)
  Echocardiogram 2D Echocardiogram has been performed.  Faith Gray 06/08/2024, 11:08 AM

## 2024-06-08 NOTE — Progress Notes (Signed)
 PROGRESS NOTE  Faith Gray  FMW:[id number] DOB: 1940-11-01 DOA: 06/05/2024 PCP: Loris Elsie PARAS, PA-C   Brief Narrative: Patient is agitated female with history of ischemic cardiomyopathy with EF of 45%, left ventricular aneurysm on Coumadin , coronary artery disease, severe hypertension who presented with cough, increased shortness of breath.  On presentation she looked volume overloaded.  Chest x-ray showed chronic interstitial markings.  She was hypertensive, her proBNP was elevated.  Started on IV Lasix ,now on hold because creatinine trended up. Cardiology also consulted and following.  Blood pressure soft so cardiac medications on hold.  Plan for doing echo today.  Blood pressure remains soft  Assessment & Plan:  Principal Problem:   Chronic combined systolic and diastolic congestive heart failure (HCC) Active Problems:   Chronic kidney disease, stage 3b (HCC)   Long term (current) use of anticoagulants   Acute exacerbation of CHF (congestive heart failure) (HCC)   Acute on chronic HFrEF: Presented with shortness of breath, cough.  Chest x-ray showed features of volume overload, elevated BNP.  Given IV Lasix .  Takes torsemide  at home. We consulted cardiology.  She follows with Dr. Bensimhon On Coreg , Entresto , spironolactone , Jardiance , Imdur  at home. Echo as per 11/2023 showed EF of 40 to 45%, regional wall motion abnormalities, grade 2 diastolic dysfunction, normal right ventricular systolic function.  Since creatinine trended up, we are holding Lasix  for now.  No shortness of breath, cough or lower extremity edema today.  Lungs are clear to auscultation. Cardiac medications on hold due to low blood pressure. Plan to do echocardiogram.  As per cardiology, she may need right heart cath  Hypetension: Blood pressure have been soft.  Antihypertensives on hold  CKD stage 3A: Baseline creatinine 1.3.  Creatinine trended up today to the range of 1.8.  Lasix  on hold, all  antihypertensive/cardiac meds on hold.    Hyperkalemia: Treated with lokelma.  Resolved  MGUS: Follows with hematology at [facility]  Left ventricular aneurysm: On Eliquis   Normocytic anemia: Likely associated with chronic disease, history of MGUS.  Iron  panel done on May 2025 showed optimal iron  level, normal ferritin.  Denies any hematochezia or melena.  Hemoglobin at baseline  Weakness: PT consulted.  Also might be associated with soft blood pressure          DVT prophylaxis:apixaban  (ELIQUIS ) tablet 2.5 mg Start: 06/05/24 2345 apixaban  (ELIQUIS ) tablet 2.5 mg     Code Status: Full Code  Family Communication: Grandson at bedside on 9/19  Patient status: Inpatient  Patient is from : Home  Anticipated discharge to: Home  Estimated DC date: After full cardiac workup   Consultants: Cardiology  Procedures: None  Antimicrobials:  Anti-infectives (From admission, onward)    None       Subjective: Patient seen and examined at bedside today.  Overall comfortable.  Lying in bed.  Blood pressure soft but stable.  Complains of some weakness but denies any shortness of breath or cough.  Appears euvolemic.  Objective: Vitals:   06/07/24 1323 06/07/24 1506 06/07/24 2130 06/08/24 0524  BP: 97/64 90/66 103/60 (!) 96/57  Pulse: 61 (!) 53 61 63  Resp: 16  18 18   Temp: 98.3 F (36.8 C)  98 F (36.7 C) 97.7 F (36.5 C)  TempSrc: Oral  Oral Oral  SpO2: 100% 100% 100% 99%  Weight:      Height:       No intake or output data in the 24 hours ending 06/08/24 1105 Filed Weights   06/06/24 0244  Weight: 46.7 kg    Examination:   General exam: Overall comfortable, not in distress, pleasant elderly female HEENT: PERRL Respiratory system:  no wheezes or crackles  Cardiovascular system: S1 & S2 heard, RRR.  Gastrointestinal system: Abdomen is nondistended, soft and nontender. Central nervous system: Alert and oriented Extremities: No edema, no clubbing ,no  cyanosis Skin: No rashes, no ulcers,no icterus     Data Reviewed: I have personally reviewed following labs and imaging studies  CBC: Recent Labs  Lab 06/05/24 2038 06/05/24 2206 06/06/24 0837  WBC 5.6  --  4.5  NEUTROABS 3.8  --   --   HGB 8.0* 8.5* 7.5*  HCT 25.3* 25.0* 23.9*  MCV 86.6  --  85.7  PLT 312  --  296   Basic Metabolic Panel: Recent Labs  Lab 06/05/24 2200 06/05/24 2206 06/06/24 0837 06/07/24 0539 06/08/24 0548  NA 140 140 139 137 135  K 4.7 5.3* 4.1 4.8 4.8  CL 106 112* 104 102 102  CO2 18*  --  21* 22 20*  GLUCOSE 78 77 94 124* 101*  BUN 22 28* 20 29* 40*  CREATININE 1.41* 1.60* 1.62* 1.83* 1.82*  CALCIUM  9.3  --  9.4 9.0 8.8*  MG  --   --  2.4  --   --      Recent Results (from the past 240 hours)  Blood culture (routine x 2)     Status: None (Preliminary result)   Collection Time: 06/05/24  6:53 PM   Specimen: BLOOD LEFT FOREARM  Result Value Ref Range Status   Specimen Description   Final    BLOOD LEFT FOREARM Performed at [facility], [address], [place], [place] [address]    Special Requests   Final    Blood Culture adequate volume BOTTLES DRAWN AEROBIC AND ANAEROBIC Performed at [facility], [address] [place], [place] [address]    Culture   Final    NO GROWTH 3 DAYS Performed at [facility], [address], [place], [place] [address]    Report Status PENDING  Incomplete  Resp panel by RT-PCR (RSV, Flu A&B, Covid) Anterior Nasal Swab     Status: None   Collection Time: 06/05/24  8:39 PM   Specimen: Anterior Nasal Swab  Result Value Ref Range Status   SARS Coronavirus 2 by RT PCR NEGATIVE NEGATIVE Final    Comment: (NOTE) SARS-CoV-2 target nucleic acids are NOT DETECTED.  The SARS-CoV-2 RNA is generally detectable in upper respiratory specimens during the acute phase of infection. The lowest concentration of SARS-CoV-2 viral copies this assay can detect is 138 copies/mL. A negative  result does not preclude SARS-Cov-2 infection and should not be used as the sole basis for treatment or other patient management decisions. A negative result may occur with  improper specimen collection/handling, submission of specimen other than nasopharyngeal swab, presence of viral mutation(s) within the areas targeted by this assay, and inadequate number of viral copies(<138 copies/mL). A negative result must be combined with clinical observations, patient history, and epidemiological information. The expected result is Negative.  Fact Sheet for Patients:  BloggerCourse.com  Fact Sheet for Healthcare Providers:  SeriousBroker.it  This test is no t yet approved or cleared by the [place]  FDA and  has been authorized for detection and/or diagnosis of SARS-CoV-2 by FDA under an Emergency Use Authorization (EUA). This EUA will remain  in effect (meaning this test can be used) for the  duration of the COVID-19 declaration under Section 564(b)(1) of the Act, 21 U.S.C.section 360bbb-3(b)(1), unless the authorization is terminated  or revoked sooner.       Influenza A by PCR NEGATIVE NEGATIVE Final   Influenza B by PCR NEGATIVE NEGATIVE Final    Comment: (NOTE) The Xpert Xpress SARS-CoV-2/FLU/RSV plus assay is intended as an aid in the diagnosis of influenza from Nasopharyngeal swab specimens and should not be used as a sole basis for treatment. Nasal washings and aspirates are unacceptable for Xpert Xpress SARS-CoV-2/FLU/RSV testing.  Fact Sheet for Patients: BloggerCourse.com  Fact Sheet for Healthcare Providers: SeriousBroker.it  This test is not yet approved or cleared by the [place]  FDA and has been authorized for detection and/or diagnosis of SARS-CoV-2 by FDA under an Emergency Use Authorization (EUA). This EUA will remain in effect (meaning this test can be used)  for the duration of the COVID-19 declaration under Section 564(b)(1) of the Act, 21 U.S.C. section 360bbb-3(b)(1), unless the authorization is terminated or revoked.     Resp Syncytial Virus by PCR NEGATIVE NEGATIVE Final    Comment: (NOTE) Fact Sheet for Patients: BloggerCourse.com  Fact Sheet for Healthcare Providers: SeriousBroker.it  This test is not yet approved or cleared by the [place]  FDA and has been authorized for detection and/or diagnosis of SARS-CoV-2 by FDA under an Emergency Use Authorization (EUA). This EUA will remain in effect (meaning this test can be used) for the duration of the COVID-19 declaration under Section 564(b)(1) of the Act, 21 U.S.C. section 360bbb-3(b)(1), unless the authorization is terminated or revoked.  Performed at [facility], [address], [place], [place] [address]   Blood culture (routine x 2)     Status: None (Preliminary result)   Collection Time: 06/05/24  8:51 PM   Specimen: BLOOD  Result Value Ref Range Status   Specimen Description   Final    BLOOD LEFT ANTECUBITAL Performed at [facility], [address], [place], [place] [address]    Special Requests   Final    Blood Culture adequate volume BOTTLES DRAWN AEROBIC AND ANAEROBIC Performed at [facility], [address] [place], [place] [address]    Culture   Final    NO GROWTH 3 DAYS Performed at [facility], [address] [place], [place] [address]    Report Status PENDING  Incomplete     Radiology Studies: No results found.   Scheduled Meds:  apixaban   2.5 mg Oral BID   atorvastatin   80 mg Oral q1800   empagliflozin   10 mg Oral Daily   ezetimibe   10 mg Oral QPM   sodium chloride  flush  3 mL Intravenous Q12H   Continuous Infusions:   LOS: 1 day   Ivonne Mustache, MD [facility] P9/21/2025, 11:05 AM

## 2024-06-08 NOTE — Progress Notes (Signed)
 DAILY PROGRESS NOTE   Patient Name: Faith Gray Date of Encounter: 06/08/2024 Cardiologist: None  Chief Complaint   No shortness of breath  Patient Profile   Faith Gray is a 83 y.o. female with a history of CAD noted on cardiac catheterization in 08/2012 (treated medically), ischemic cardiomyopathy/ chronic HFrEF with EF of 40-45% on last Echo in 11/2023, LV aneurysm on Eliquis , moderate to severe mitral regurgitation, hypertension, anemia, and MGUS who is being seen 06/06/2024 for the evaluation of acute on chronic CHF at the request of Dr. Jillian.   Subjective   Creatinine stable at 1.82. Hypotension continues to be an issue - holding cardiac meds and diuretics. Says she is breathing well and feels 100% better.  Objective   Vitals:   06/07/24 1323 06/07/24 1506 06/07/24 2130 06/08/24 0524  BP: 97/64 90/66 103/60 (!) 96/57  Pulse: 61 (!) 53 61 63  Resp: 16  18 18   Temp: 98.3 F (36.8 C)  98 F (36.7 C) 97.7 F (36.5 C)  TempSrc: Oral  Oral Oral  SpO2: 100% 100% 100% 99%  Weight:      Height:       No intake or output data in the 24 hours ending 06/08/24 0941 Filed Weights   06/06/24 0244  Weight: 46.7 kg    Physical Exam   General appearance: alert and no distress Neck: no carotid bruit, no JVD, and thyroid not enlarged, symmetric, no tenderness/mass/nodules Lungs: clear to auscultation bilaterally Heart: regular rate and rhythm, S1, S2 normal, and systolic murmur: holosystolic 3/6, blowing at apex Extremities: extremities normal, atraumatic, no cyanosis or edema Neurologic: Grossly normal  Inpatient Medications    Scheduled Meds:  apixaban   2.5 mg Oral BID   atorvastatin   80 mg Oral q1800   empagliflozin   10 mg Oral Daily   ezetimibe   10 mg Oral QPM   sodium chloride  flush  3 mL Intravenous Q12H    Continuous Infusions:   PRN Meds: acetaminophen  **OR** acetaminophen , guaiFENesin -dextromethorphan , hydrALAZINE , melatonin, ondansetron  **OR**  ondansetron  (ZOFRAN ) IV, senna-docusate   Labs   Results for orders placed or performed during the hospital encounter of 06/05/24 (from the past 48 hours)  Basic metabolic panel with GFR     Status: Abnormal   Collection Time: 06/07/24  5:39 AM  Result Value Ref Range   Sodium 137 135 - 145 mmol/L   Potassium 4.8 3.5 - 5.1 mmol/L   Chloride 102 98 - 111 mmol/L   CO2 22 22 - 32 mmol/L   Glucose, Bld 124 (H) 70 - 99 mg/dL    Comment: Glucose reference range applies only to samples taken after fasting for at least 8 hours.   BUN 29 (H) 8 - 23 mg/dL   Creatinine, Ser 8.16 (H) 0.44 - 1.00 mg/dL   Calcium  9.0 8.9 - 10.3 mg/dL   GFR, Estimated 27 (L) >60 mL/min    Comment: (NOTE) Calculated using the CKD-EPI Creatinine Equation (2021)    Anion gap 13 5 - 15    Comment: Performed at [facility], [address] [place], [place] [address]  Basic metabolic panel with GFR     Status: Abnormal   Collection Time: 06/08/24  5:48 AM  Result Value Ref Range   Sodium 135 135 - 145 mmol/L   Potassium 4.8 3.5 - 5.1 mmol/L   Chloride 102 98 - 111 mmol/L   CO2 20 (L) 22 - 32 mmol/L   Glucose, Bld 101 (H) 70 -  99 mg/dL    Comment: Glucose reference range applies only to samples taken after fasting for at least 8 hours.   BUN 40 (H) 8 - 23 mg/dL   Creatinine, Ser 8.17 (H) 0.44 - 1.00 mg/dL   Calcium  8.8 (L) 8.9 - 10.3 mg/dL   GFR, Estimated 27 (L) >60 mL/min    Comment: (NOTE) Calculated using the CKD-EPI Creatinine Equation (2021)    Anion gap 14 5 - 15    Comment: Performed at [facility], [address] [place], [place] [address]    ECG   N/A  Telemetry   Sinus brady - Personally Reviewed  Radiology    No results found.   Cardiac Studies   N/A  Assessment   Principal Problem:   Chronic combined systolic and diastolic congestive heart failure (HCC) Active Problems:   Long term (current) use of anticoagulants   Chronic kidney  disease, stage 3b (HCC)   Acute exacerbation of CHF (congestive heart failure) (HCC)   Plan   No breathing issues- feels 100% better- does not appear volume overloaded, however, I'm confused as to why we have had to hold her BP meds and she remains hypotensive with worsening renal function. Also, pro-BNP was higher - suggesting possible worsening HF. I will order a limited echo today to re-assess her LV function and MR, which may be severe based on exam. Again, based on this, may need to consider further evaluation or possible RHC this coming week. D/w patient and daughter by phone at the bedside.  Time Spent Directly with Patient:  I have spent a total of 25 minutes with the patient reviewing hospital notes, telemetry, EKGs, labs and examining the patient as well as establishing an assessment and plan that was discussed personally with the patient.  > 50% of time was spent in direct patient care.  Length of Stay:  LOS: 1 day   Vinie KYM Maxcy, MD, [facility], FNLA, FACP  [place]  [facility]  Medical Director of the [facility]  Cardiovascular Risk Reduction Clinic Diplomate of the American Board of Clinical Lipidology Attending Cardiologist  Direct Dial: [phone]  Fax: [phone]  Website:  [email or web address] Vinie JAYSON Maxcy 06/08/2024, 9:41 AM

## 2024-06-08 NOTE — Evaluation (Signed)
 Physical Therapy Brief Evaluation and Discharge Note Patient Details Name: Jenessa Gillingham MRN: [id number] DOB: 04-Mar-1941 Today's Date: 06/08/2024   History of Present Illness  83 yo female admitted withCHF. Hx of HF, CAD, TIA, NSTEMI, severe hypertension, anemia, syncope  Clinical Impression  Ind with mobility. Pt denied dyspnea, lightheadedness. Tolerated activity well. No acute PT needs. 1x.        PT Assessment    Assistance Needed at Discharge       Equipment Recommendations None recommended by PT  Recommendations for Other Services       Precautions/Restrictions Restrictions Weight Bearing Restrictions Per Provider Order: No        Mobility  Bed Mobility          Transfers Overall transfer level: Independent                      Ambulation/Gait Ambulation/Gait assistance: Independent Gait Distance (Feet): 250 Feet Assistive device: None Gait Pattern/deviations: WFL(Within Functional Limits)   General Gait Details: pt denied dizziness, dyspnea. tolerated distance well.  Home Activity Instructions    Stairs            Modified Rankin (Stroke Patients Only)        Balance                          Pertinent Vitals/Pain   Pain Assessment Pain Assessment: No/denies pain     Home Living   Living Arrangements: Other relatives       Home Equipment: None   Additional Comments: lives with grandson and has other family within close proximity for additional assistance    Prior Function        UE/LE Assessment               Communication   Communication Communication: No apparent difficulties     Cognition         General Comments      Exercises     Assessment/Plan    PT Problem List         PT Visit Diagnosis      No Skilled PT Patient is independent with all acitivity/mobility   Co-evaluation                AMPAC 6 Clicks Help needed turning from your back to your side while in  a flat bed without using bedrails?: None Help needed moving from lying on your back to sitting on the side of a flat bed without using bedrails?: None Help needed moving to and from a bed to a chair (including a wheelchair)?: None Help needed standing up from a chair using your arms (e.g., wheelchair or bedside chair)?: None Help needed to walk in hospital room?: None Help needed climbing 3-5 steps with a railing? : A Little 6 Click Score: 23      End of Session Equipment Utilized During Treatment: Gait belt Activity Tolerance: Patient tolerated treatment well Patient left: in bed;with call bell/phone within reach         Time: 1500-1508 PT Time Calculation (min) (ACUTE ONLY): 8 min  Charges:   PT Evaluation $PT Eval Low Complexity: 1 Low          Dannial SQUIBB, PT Acute Rehabilitation  Office: [phone]

## 2024-06-09 ENCOUNTER — Other Ambulatory Visit (HOSPITAL_COMMUNITY): Payer: Self-pay

## 2024-06-09 ENCOUNTER — Other Ambulatory Visit: Payer: Self-pay

## 2024-06-09 DIAGNOSIS — I5042 Chronic combined systolic (congestive) and diastolic (congestive) heart failure: Secondary | ICD-10-CM | POA: Diagnosis not present

## 2024-06-09 DIAGNOSIS — I951 Orthostatic hypotension: Secondary | ICD-10-CM | POA: Diagnosis not present

## 2024-06-09 DIAGNOSIS — I255 Ischemic cardiomyopathy: Secondary | ICD-10-CM | POA: Diagnosis not present

## 2024-06-09 DIAGNOSIS — I340 Nonrheumatic mitral (valve) insufficiency: Secondary | ICD-10-CM | POA: Diagnosis not present

## 2024-06-09 LAB — CBC
HCT: 25.6 % — ABNORMAL LOW (ref 36.0–46.0)
Hemoglobin: 8.1 g/dL — ABNORMAL LOW (ref 12.0–15.0)
MCH: 27.7 pg (ref 26.0–34.0)
MCHC: 31.6 g/dL (ref 30.0–36.0)
MCV: 87.7 fL (ref 80.0–100.0)
Platelets: 345 K/uL (ref 150–400)
RBC: 2.92 MIL/uL — ABNORMAL LOW (ref 3.87–5.11)
RDW: 13.7 % (ref 11.5–15.5)
WBC: 5.2 K/uL (ref 4.0–10.5)
nRBC: 0.4 % — ABNORMAL HIGH (ref 0.0–0.2)

## 2024-06-09 LAB — BASIC METABOLIC PANEL WITH GFR
Anion gap: 15 (ref 5–15)
BUN: 45 mg/dL — ABNORMAL HIGH (ref 8–23)
CO2: 21 mmol/L — ABNORMAL LOW (ref 22–32)
Calcium: 9.2 mg/dL (ref 8.9–10.3)
Chloride: 100 mmol/L (ref 98–111)
Creatinine, Ser: 1.9 mg/dL — ABNORMAL HIGH (ref 0.44–1.00)
GFR, Estimated: 26 mL/min — ABNORMAL LOW (ref >60)
Glucose, Bld: 101 mg/dL — ABNORMAL HIGH (ref 70–99)
Potassium: 4.4 mmol/L (ref 3.5–5.1)
Sodium: 136 mmol/L (ref 135–145)

## 2024-06-09 MED ORDER — APIXABAN 2.5 MG PO TABS
2.5000 mg | ORAL_TABLET | Freq: Two times a day (BID) | ORAL | 3 refills | Status: AC
  Filled 2024-06-09: qty 60, 30d supply, fill #0

## 2024-06-09 NOTE — TOC Progression Note (Signed)
 Transition of Care [facility]) - Progression Note   Patient Details  Name: Layal Riederer MRN: [id number] Date of Birth: [birth year]  [facility] of [facility]) CM/SW Contact  [name], LCSW Phone Number: 06/09/2024, 8:35 AM  Clinical Narrative: PT evaluation did not recommend any follow up or DME.  Expected Discharge Plan: Home/Self Care Barriers to Discharge: Continued Medical Work up  Expected Discharge Plan and Services In-house Referral: Clinical Social Work Living arrangements for the past 2 months: Single Family Home  Social Drivers of Health (SDOH) Interventions SDOH Screenings   Food Insecurity: No Food Insecurity (06/06/2024)  Housing: Low Risk  (06/06/2024)  Transportation Needs: No Transportation Needs (06/06/2024)  Utilities: Not At Risk (06/06/2024)  Social Connections: Moderately Integrated (06/06/2024)  Tobacco Use: Medium Risk (06/06/2024)   Readmission Risk Interventions     No data to display

## 2024-06-09 NOTE — Progress Notes (Signed)
 [facility] Liaison Note:  Notified by [facility] manager of patient/family request for [facility] Palliative services at home after discharge. Please call with any hospice or outpatient palliative care related questions. Thank you for the opportunity to participate in this patient's care.  Eleanor Nail, LPN [facility] Liaison [phone]

## 2024-06-09 NOTE — Discharge Summary (Addendum)
 Physician Discharge Summary  Faith Gray FMW:[id number] DOB: 1941-08-12 DOA: 06/05/2024  PCP: Faith Elsie PARAS, PA-C  Admit date: 06/05/2024 Discharge date: 06/09/2024  Admitted From: Home Disposition:  Home  Discharge Condition:Stable CODE STATUS:FULL Diet recommendation: Heart Healthy   Brief/Interim Summary: Patient is 83 year old  female with history of ischemic cardiomyopathy with EF of 45%, left ventricular aneurysm on eliquis , coronary artery disease, severe hypertension who presented with cough, increased shortness of breath.  On presentation she looked volume overloaded.  Chest x-ray showed chronic interstitial markings.  She was hypertensive, her proBNP was elevated.  Started on IV Lasix ,now on hold because creatinine trended up. Cardiology also consulted and following.  Blood pressure soft so cardiac medications on hold.  Blood pressure remains soft today but she is asymptomatic and she feels much better today.  Severely wants to go home.  She does not want to stay anymore in the hospital.  Cardiology recommended for at least but she does not want to do any kind of procedure.  Cardiac meds discontinued due to low blood pressure.  Palliative care consulted and she has been an outpatient follow-up.  She has an appointment  with cardiology on 9/30  Following problems were addressed during the hospitalization:  Acute on chronic HFrEF: Presented with shortness of breath, cough.  Chest x-ray showed features of volume overload, elevated BNP.  Given IV Lasix .  Takes torsemide  at home. We consulted cardiology.  She follows with Dr. Bensimhon On Coreg , Entresto , spironolactone , Jardiance , Imdur  at home. Echo as per 11/2023 showed EF of 40 to 45%, regional wall motion abnormalities, grade 2 diastolic dysfunction, normal right ventricular systolic function.  Echo done here showed same EF, grade 2 diastolic dysfunction, severe mitral valve regurgitation.  No shortness of breath, cough or lower  extremity edema today.  Lungs are clear to auscultation. Cardiology recommended for at least but she does not want to do any kind of procedure.  Cardiac meds on hold due to low blood pressure.  Palliative care consulted and she has been an outpatient follow-up.  She has an appointment  with cardiology on 9/30   Hypetension: Blood pressure have been soft.  Antihypertensives on hold   CKD stage 3A: Baseline creatinine 1.3.  Creatinine trended up and remains at 1.9 .  Lasix  on hold, all antihypertensive/cardiac meds on hold.  Do BMP testing a week to check kidney function   Hyperkalemia: Treated with lokelma.  Resolved   MGUS: Follows with hematology at [facility]   Left ventricular aneurysm: On Eliquis    Normocytic anemia: Likely associated with chronic disease, history of MGUS.  Iron  panel done on May 2025 showed optimal iron  level, normal ferritin.  Denies any hematochezia or melena.  Hemoglobin at baseline   Weakness: PT consulted.  No follow-up recommended   Discharge Diagnoses:  Principal Problem:   Chronic combined systolic and diastolic congestive heart failure (HCC) Active Problems:   Chronic kidney disease, stage 3b (HCC)   Long term (current) use of anticoagulants   Acute exacerbation of CHF (congestive heart failure) (HCC)    Discharge Instructions  Discharge Instructions     Diet - low sodium heart healthy   Complete by: As directed    Discharge instructions   Complete by: As directed    1)Please take your medications as instructed 2)Follow up with your PCP next week and do a BMP test to check your kidney function during the follow-up 3)You have an appointment with cardiology on 9/30.  Please keep that  appointment.  We have discontinued most of your heart medications due to low blood pressure.  Monitor blood pressure at home   Increase activity slowly   Complete by: As directed       Allergies as of 06/09/2024       Reactions   Vicodin [hydrocodone-acetaminophen ]  Nausea And Vomiting        Medication List     STOP taking these medications    amLODipine  10 MG tablet Commonly known as: NORVASC    carvedilol  25 MG tablet Commonly known as: COREG    Entresto  97-103 MG Generic drug: sacubitril -valsartan    isosorbide  mononitrate 30 MG 24 hr tablet Commonly known as: IMDUR    spironolactone  25 MG tablet Commonly known as: ALDACTONE    torsemide  20 MG tablet Commonly known as: DEMADEX        TAKE these medications    atorvastatin  80 MG tablet Commonly known as: LIPITOR  TAKE 1 TABLET BY MOUTH DAILY AT 6PM What changed: See the new instructions.   Eliquis  2.5 MG Tabs tablet Generic drug: apixaban  Take 1 tablet (2.5 mg total) by mouth 2 (two) times daily.   empagliflozin  10 MG Tabs tablet Commonly known as: Jardiance  Take 1 tablet (10 mg total) by mouth daily.   ezetimibe  10 MG tablet Commonly known as: Zetia  Take 1 tablet (10 mg total) by mouth daily. What changed: when to take this   ferrous sulfate 325 (65 FE) MG EC tablet Take 325 mg by mouth daily after breakfast.   pregabalin  25 MG capsule Commonly known as: LYRICA  Take 25 mg by mouth as needed (nerve pain).   TYLENOL  500 MG tablet Generic drug: acetaminophen  Take 500-1,000 mg by mouth every 6 (six) hours as needed for mild pain or headache.        Follow-up Information     [facility] Follow up.   Specialty: Cardiology Why: Hospital follow-up with the Advanced [facility] team schduled for 06/17/2024 at 11:00am. If this date/ time does not work for you, please call our office to reschedule. Contact information: [address] [place]  [address] [phone]               Allergies  Allergen Reactions   Vicodin [Hydrocodone-Acetaminophen ] Nausea And Vomiting    Consultations: Cardiology   Procedures/Studies: ECHOCARDIOGRAM COMPLETE Result Date: 06/08/2024    ECHOCARDIOGRAM REPORT   Patient  Name:   Faith Gray Date of Exam: 06/08/2024 Medical Rec #:  [id number]     Height:       59.0 in Accession #:    [id number]    Weight:       103.0 lb Date of Birth:  07-08-1941     BSA:          1.391 m Patient Age:    83 years      BP:           96/57 mmHg Patient Gender: F             HR:           48 bpm. Exam Location:  Inpatient Procedure: 2D Echo (Both Spectral and Color Flow Doppler were utilized during            procedure). Indications:    CHF  History:        Patient has prior history of Echocardiogram examinations. CHF,                 CAD; Signs/Symptoms:Shortness of  Breath.  Sonographer:    Norleen Amour Referring Phys: [id number] KENNETH C HILTY IMPRESSIONS  1. The basal to mid inferior wall is aneurysmal. Left ventricular ejection fraction, by estimation, is 40 to 45%. Left ventricular ejection fraction by 2D MOD biplane is 41.1 %. The left ventricle has mildly decreased function. The left ventricle demonstrates regional wall motion abnormalities (see scoring diagram/findings for description). The left ventricular internal cavity size was mildly dilated. Left ventricular diastolic parameters are consistent with Grade II diastolic dysfunction (pseudonormalization). Elevated left ventricular end-diastolic pressure. The E/e' is 22. There is severe akinesis of the left ventricular, entire inferior wall.  2. Right ventricular systolic function is normal. The right ventricular size is normal. Tricuspid regurgitation signal is inadequate for assessing PA pressure.  3. Left atrial size was severely dilated.  4. 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 The mitral valve is degenerative. Severe mitral valve regurgitation.  5. The aortic valve is tricuspid. Aortic valve regurgitation is not visualized. Aortic valve sclerosis/calcification is present, without any evidence of aortic stenosis.  6. The inferior vena cava is normal in size with greater than  50% respiratory variability, suggesting right atrial pressure of 3 mmHg. Comparison(s): Changes from prior study are noted. 12/14/2023: LVEF 40-45%, grade 2 DD, moderate MR. FINDINGS  Left Ventricle: The basal to mid inferior wall is aneurysmal. Left ventricular ejection fraction, by estimation, is 40 to 45%. Left ventricular ejection fraction by 2D MOD biplane is 41.1 %. The left ventricle has mildly decreased function. The left ventricle demonstrates regional wall motion abnormalities. Severe akinesis of the left ventricular, entire inferior wall. The left ventricular internal cavity size was mildly dilated. There is no left ventricular hypertrophy. Left ventricular diastolic parameters are consistent with Grade II diastolic dysfunction (pseudonormalization). Elevated left ventricular end-diastolic pressure. The E/e' is 22.  LV Wall Scoring: The posterior wall is aneurysmal. The apical lateral segment and apical inferior segment are hypokinetic. Right Ventricle: The right ventricular size is normal. No increase in right ventricular wall thickness. Right ventricular systolic function is normal. Tricuspid regurgitation signal is inadequate for assessing PA pressure. Left [facility]: Left atrial size was severely dilated. Right [facility]: Right atrial size was normal in size. Pericardium: There is no evidence of pericardial effusion. 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 The mitral valve is degenerative in appearance. There is moderate calcification of the anterior and posterior mitral valve leaflet(s). Severe mitral valve regurgitation, with posteriorly-directed jet. MV peak gradient, 8.5 mmHg. The mean mitral valve gradient is 2.0 mmHg. Tricuspid Valve: The tricuspid valve is grossly normal. Tricuspid valve regurgitation is trivial. Aortic Valve: The aortic valve is tricuspid. Aortic valve regurgitation is not visualized. Aortic valve  sclerosis/calcification is present, without any evidence of aortic stenosis. Aortic valve mean gradient measures 5.0 mmHg. Aortic valve peak gradient measures 10.1 mmHg. Aortic valve area, by VTI measures 0.52 cm. Pulmonic Valve: The pulmonic valve was grossly normal. Pulmonic valve regurgitation is trivial. Aorta: The aortic root and ascending aorta are structurally normal, with no evidence of dilitation. Venous: The inferior vena cava is normal in size with greater than 50% respiratory variability, suggesting right atrial pressure of 3 mmHg. IAS/Shunts: No atrial level shunt detected by color flow Doppler.  LEFT VENTRICLE PLAX 2D                        Biplane EF (MOD) LVIDd:  5.80 cm         LV Biplane EF:   Left LVIDs:         4.10 cm                          ventricular LV PW:         0.80 cm                          ejection LV IVS:        0.70 cm                          fraction by LVOT diam:     1.50 cm                          2D MOD LV SV:         18                               biplane is LV SV Index:   13                               41.1 %. LVOT Area:     1.77 cm                                Diastology                                LV e' medial:    6.42 cm/s LV Volumes (MOD)               LV E/e' medial:  21.2 LV vol d, MOD    224.0 ml      LV e' lateral:   5.98 cm/s A2C:                           LV E/e' lateral: 22.7 LV vol d, MOD    148.0 ml A4C: LV vol s, MOD    141.0 ml A2C: LV vol s, MOD    80.5 ml A4C: LV SV MOD A2C:   83.0 ml LV SV MOD A4C:   148.0 ml LV SV MOD BP:    79.2 ml RIGHT VENTRICLE             IVC RV Basal diam:  2.60 cm     IVC diam: 1.20 cm RV S prime:     12.80 cm/s TAPSE (M-mode): 2.3 cm LEFT ATRIUM            Index        RIGHT ATRIUM           Index LA diam:      4.70 cm  3.38 cm/m   RA Area:     11.00 cm LA Vol (A2C): 71.8 ml  51.61 ml/m  RA Volume:   25.10 ml  18.04 ml/m LA Vol (A4C): 101.0 ml 72.60 ml/m  AORTIC VALVE                     PULMONIC  VALVE AV  Area (Vmax):    0.63 cm      PV Vmax:       0.85 m/s AV Area (Vmean):   0.65 cm      PV Peak grad:  2.9 mmHg AV Area (VTI):     0.52 cm AV Vmax:           159.00 cm/s AV Vmean:          104.000 cm/s AV VTI:            0.353 m AV Peak Grad:      10.1 mmHg AV Mean Grad:      5.0 mmHg LVOT Vmax:         56.40 cm/s LVOT Vmean:        38.500 cm/s LVOT VTI:          0.104 m LVOT/AV VTI ratio: 0.29  AORTA Ao Root diam: 2.70 cm Ao Asc diam:  2.20 cm MITRAL VALVE MV Area (PHT): 1.79 cm       SHUNTS MV Area VTI:   0.37 cm       Systemic VTI:  0.10 m MV Peak grad:  8.5 mmHg       Systemic Diam: 1.50 cm MV Mean grad:  2.0 mmHg MV Vmax:       1.46 m/s MV Vmean:      68.9 cm/s MV Decel Time: 424 msec MR Peak grad:    81.0 mmHg MR Mean grad:    50.0 mmHg MR Vmax:         450.00 cm/s MR Vmean:        337.0 cm/s MR PISA:         1.90 cm MR PISA Eff ROA: 13 mm MR PISA Radius:  0.55 cm MV E velocity: 136.00 cm/s MV A velocity: 87.50 cm/s MV E/A ratio:  1.55 Vinie Maxcy MD Electronically signed by Vinie Maxcy MD Signature Date/Time: 06/08/2024/12:25:50 PM    Final    DG Chest 2 View Result Date: 06/05/2024 EXAM: 2 VIEW(S) XRAY OF THE CHEST 06/05/2024 11:50:16 AM COMPARISON: 12/13/2023 CLINICAL HISTORY: Shortness of breath, cough, h/o CHF. Pt reports productive cough, nasal congestion, and constant shortness of breath x2 days. Cold symptoms started yesterday and SOB became more noticeable today. SOB even at rest. Pulse oximetry 90-95% in triage. Tachypneic. Pt has heart failure - last exacerbation March 2025. Coricidin taken at home with little relief. Denies fevers and chills. Pt also notes mild sore throat today. FINDINGS: LUNGS AND PLEURA: Chronic interstitial markings without acute opacity. No pleural effusion. No pneumothorax. HEART AND MEDIASTINUM: Atherosclerotic aortic calcifications. No acute abnormality of the cardiac and mediastinal silhouettes. BONES AND SOFT TISSUES: No acute osseous abnormality. IMPRESSION: 1.  No acute findings. 2. Chronic interstitial markings without acute opacity. Electronically signed by: Donnice Mania MD 06/05/2024 01:28 PM EDT RP Workstation: [id number]      Subjective: Patient seen and examined at the bedside today.  Comfortable.  Lying in bed.  Blood pressure soft but she is asymptomatic.  No lower extremity edema.  Lungs are clear to auscultation.  She does not want to stay anymore in the hospital.  She really wants to go home.  Grandson at the bedside and he understands the situation.  We consulted palliaitve  care and outpatient follow-up set up.  She has an appointment with cardiology on 9/30. I also called and discussed with her daughter Christal on phone today.  Discharge Exam: Vitals:   06/08/24  2014 06/09/24 0502  BP: 96/71 (!) 91/53  Pulse: 71 64  Resp: 17 17  Temp: 98.4 F (36.9 C) 97.9 F (36.6 C)  SpO2: 100% 100%   Vitals:   06/08/24 0524 06/08/24 1443 06/08/24 2014 06/09/24 0502  BP: (!) 96/57 103/73 96/71 (!) 91/53  Pulse: 63 (!) 101 71 64  Resp: 18 16 17 17   Temp: 97.7 F (36.5 C) 98.8 F (37.1 C) 98.4 F (36.9 C) 97.9 F (36.6 C)  TempSrc: Oral Oral Oral Oral  SpO2: 99% 90% 100% 100%  Weight:      Height:        General: Pt is alert, awake, not in acute distress, pleasant elderly female Cardiovascular: RRR, S1/S2 +, no rubs, no gallops Respiratory: CTA bilaterally, no wheezing, no rhonchi Abdominal: Soft, NT, ND, bowel sounds + Extremities: no edema, no cyanosis    The results of significant diagnostics from this hospitalization (including imaging, microbiology, ancillary and laboratory) are listed below for reference.     Microbiology: Recent Results (from the past 240 hours)  Blood culture (routine x 2)     Status: None (Preliminary result)   Collection Time: 06/05/24  6:53 PM   Specimen: BLOOD LEFT FOREARM  Result Value Ref Range Status   Specimen Description   Final    BLOOD LEFT FOREARM Performed at [facility],  [address] [place], [place] [address]    Special Requests   Final    Blood Culture adequate volume BOTTLES DRAWN AEROBIC AND ANAEROBIC Performed at [facility], [address] [place], [place] [address]    Culture   Final    NO GROWTH 4 DAYS Performed at [facility], [address], [place], [place] [address]    Report Status PENDING  Incomplete  Resp panel by RT-PCR (RSV, Flu A&B, Covid) Anterior Nasal Swab     Status: None   Collection Time: 06/05/24  8:39 PM   Specimen: Anterior Nasal Swab  Result Value Ref Range Status   SARS Coronavirus 2 by RT PCR NEGATIVE NEGATIVE Final    Comment: (NOTE) SARS-CoV-2 target nucleic acids are NOT DETECTED.  The SARS-CoV-2 RNA is generally detectable in upper respiratory specimens during the acute phase of infection. The lowest concentration of SARS-CoV-2 viral copies this assay can detect is 138 copies/mL. A negative result does not preclude SARS-Cov-2 infection and should not be used as the sole basis for treatment or other patient management decisions. A negative result may occur with  improper specimen collection/handling, submission of specimen other than nasopharyngeal swab, presence of viral mutation(s) within the areas targeted by this assay, and inadequate number of viral copies(<138 copies/mL). A negative result must be combined with clinical observations, patient history, and epidemiological information. The expected result is Negative.  Fact Sheet for Patients:  BloggerCourse.com  Fact Sheet for Healthcare Providers:  SeriousBroker.it  This test is no t yet approved or cleared by the [place]  FDA and  has been authorized for detection and/or diagnosis of SARS-CoV-2 by FDA under an Emergency Use Authorization (EUA). This EUA will remain  in effect (meaning this test can be used) for the duration of the COVID-19 declaration under Section 564(b)(1)  of the Act, 21 U.S.C.section 360bbb-3(b)(1), unless the authorization is terminated  or revoked sooner.       Influenza A by PCR NEGATIVE NEGATIVE Final   Influenza B by PCR NEGATIVE NEGATIVE Final    Comment: (NOTE) The Xpert Xpress SARS-CoV-2/FLU/RSV  plus assay is intended as an aid in the diagnosis of influenza from Nasopharyngeal swab specimens and should not be used as a sole basis for treatment. Nasal washings and aspirates are unacceptable for Xpert Xpress SARS-CoV-2/FLU/RSV testing.  Fact Sheet for Patients: BloggerCourse.com  Fact Sheet for Healthcare Providers: SeriousBroker.it  This test is not yet approved or cleared by the [place]  FDA and has been authorized for detection and/or diagnosis of SARS-CoV-2 by FDA under an Emergency Use Authorization (EUA). This EUA will remain in effect (meaning this test can be used) for the duration of the COVID-19 declaration under Section 564(b)(1) of the Act, 21 U.S.C. section 360bbb-3(b)(1), unless the authorization is terminated or revoked.     Resp Syncytial Virus by PCR NEGATIVE NEGATIVE Final    Comment: (NOTE) Fact Sheet for Patients: BloggerCourse.com  Fact Sheet for Healthcare Providers: SeriousBroker.it  This test is not yet approved or cleared by the [place]  FDA and has been authorized for detection and/or diagnosis of SARS-CoV-2 by FDA under an Emergency Use Authorization (EUA). This EUA will remain in effect (meaning this test can be used) for the duration of the COVID-19 declaration under Section 564(b)(1) of the Act, 21 U.S.C. section 360bbb-3(b)(1), unless the authorization is terminated or revoked.  Performed at [facility], [address] [place], [place] [address]   Blood culture (routine x 2)     Status: None (Preliminary result)   Collection Time: 06/05/24  8:51 PM    Specimen: BLOOD  Result Value Ref Range Status   Specimen Description   Final    BLOOD LEFT ANTECUBITAL Performed at [facility], [address] [place], [place] [address]    Special Requests   Final    Blood Culture adequate volume BOTTLES DRAWN AEROBIC AND ANAEROBIC Performed at [facility], [address] [place], [place] [address]    Culture   Final    NO GROWTH 4 DAYS Performed at [facility], [address] [place], [place] [address]    Report Status PENDING  Incomplete     Labs: BNP (last 3 results) Recent Labs    12/13/23 1129  BNP 3,144.7*   Basic Metabolic Panel: Recent Labs  Lab 06/05/24 2200 06/05/24 2206 06/06/24 0837 06/07/24 0539 06/08/24 0548 06/09/24 0513  NA 140 140 139 137 135 136  K 4.7 5.3* 4.1 4.8 4.8 4.4  CL 106 112* 104 102 102 100  CO2 18*  --  21* 22 20* 21*  GLUCOSE 78 77 94 124* 101* 101*  BUN 22 28* 20 29* 40* 45*  CREATININE 1.41* 1.60* 1.62* 1.83* 1.82* 1.90*  CALCIUM  9.3  --  9.4 9.0 8.8* 9.2  MG  --   --  2.4  --   --   --    Liver Function Tests: No results for input(s): AST, ALT, ALKPHOS, BILITOT, PROT, ALBUMIN in the last 168 hours. No results for input(s): LIPASE, AMYLASE in the last 168 hours. No results for input(s): AMMONIA in the last 168 hours. CBC: Recent Labs  Lab 06/05/24 2038 06/05/24 2206 06/06/24 0837 06/09/24 0513  WBC 5.6  --  4.5 5.2  NEUTROABS 3.8  --   --   --   HGB 8.0* 8.5* 7.5* 8.1*  HCT 25.3* 25.0* 23.9* 25.6*  MCV 86.6  --  85.7 87.7  PLT 312  --  296 345   Cardiac Enzymes: No results for input(s): CKTOTAL, CKMB, CKMBINDEX, TROPONINI in the  last 168 hours. BNP: Invalid input(s): POCBNP CBG: No results for input(s): GLUCAP in the last 168 hours. D-Dimer No results for input(s): DDIMER in the last 72 hours. Hgb A1c No results for input(s): HGBA1C in the last 72 hours. Lipid Profile No results for input(s):  CHOL, HDL, LDLCALC, TRIG, CHOLHDL, LDLDIRECT in the last 72 hours. Thyroid function studies No results for input(s): TSH, T4TOTAL, T3FREE, THYROIDAB in the last 72 hours.  Invalid input(s): FREET3 Anemia work up No results for input(s): VITAMINB12, FOLATE, FERRITIN, TIBC, IRON , RETICCTPCT in the last 72 hours. Urinalysis    Component Value Date/Time   COLORURINE STRAW (A) 01/07/2023 2113   APPEARANCEUR CLEAR 01/07/2023 2113   LABSPEC 1.006 01/07/2023 2113   PHURINE 6.0 01/07/2023 2113   GLUCOSEU NEGATIVE 01/07/2023 2113   HGBUR SMALL (A) 01/07/2023 2113   BILIRUBINUR NEGATIVE 01/07/2023 2113   KETONESUR NEGATIVE 01/07/2023 2113   PROTEINUR NEGATIVE 01/07/2023 2113   NITRITE NEGATIVE 01/07/2023 2113   LEUKOCYTESUR NEGATIVE 01/07/2023 2113   Sepsis Labs Recent Labs  Lab 06/05/24 2038 06/06/24 0837 06/09/24 0513  WBC 5.6 4.5 5.2   Microbiology Recent Results (from the past 240 hours)  Blood culture (routine x 2)     Status: None (Preliminary result)   Collection Time: 06/05/24  6:53 PM   Specimen: BLOOD LEFT FOREARM  Result Value Ref Range Status   Specimen Description   Final    BLOOD LEFT FOREARM Performed at [facility], [address] [place], [place] [address]    Special Requests   Final    Blood Culture adequate volume BOTTLES DRAWN AEROBIC AND ANAEROBIC Performed at [facility], [address] [place], [place] [address]    Culture   Final    NO GROWTH 4 DAYS Performed at [facility], [address] [place], [place] [address]    Report Status PENDING  Incomplete  Resp panel by RT-PCR (RSV, Flu A&B, Covid) Anterior Nasal Swab     Status: None   Collection Time: 06/05/24  8:39 PM   Specimen: Anterior Nasal Swab  Result Value Ref Range Status   SARS Coronavirus 2 by RT PCR NEGATIVE NEGATIVE Final    Comment: (NOTE) SARS-CoV-2 target nucleic acids are NOT DETECTED.  The SARS-CoV-2 RNA is  generally detectable in upper respiratory specimens during the acute phase of infection. The lowest concentration of SARS-CoV-2 viral copies this assay can detect is 138 copies/mL. A negative result does not preclude SARS-Cov-2 infection and should not be used as the sole basis for treatment or other patient management decisions. A negative result may occur with  improper specimen collection/handling, submission of specimen other than nasopharyngeal swab, presence of viral mutation(s) within the areas targeted by this assay, and inadequate number of viral copies(<138 copies/mL). A negative result must be combined with clinical observations, patient history, and epidemiological information. The expected result is Negative.  Fact Sheet for Patients:  BloggerCourse.com  Fact Sheet for Healthcare Providers:  SeriousBroker.it  This test is no t yet approved or cleared by the [place]  FDA and  has been authorized for detection and/or diagnosis of SARS-CoV-2 by FDA under an Emergency Use Authorization (EUA). This EUA will remain  in effect (meaning this test can be used) for the duration of the COVID-19 declaration under Section 564(b)(1) of the Act, 21 U.S.C.section 360bbb-3(b)(1), unless the authorization is terminated  or revoked sooner.       Influenza A by PCR NEGATIVE NEGATIVE Final  Influenza B by PCR NEGATIVE NEGATIVE Final    Comment: (NOTE) The Xpert Xpress SARS-CoV-2/FLU/RSV plus assay is intended as an aid in the diagnosis of influenza from Nasopharyngeal swab specimens and should not be used as a sole basis for treatment. Nasal washings and aspirates are unacceptable for Xpert Xpress SARS-CoV-2/FLU/RSV testing.  Fact Sheet for Patients: BloggerCourse.com  Fact Sheet for Healthcare Providers: SeriousBroker.it  This test is not yet approved or cleared by the [place] FDA and has been authorized for detection and/or diagnosis of SARS-CoV-2 by FDA under an Emergency Use Authorization (EUA). This EUA will remain in effect (meaning this test can be used) for the duration of the COVID-19 declaration under Section 564(b)(1) of the Act, 21 U.S.C. section 360bbb-3(b)(1), unless the authorization is terminated or revoked.     Resp Syncytial Virus by PCR NEGATIVE NEGATIVE Final    Comment: (NOTE) Fact Sheet for Patients: BloggerCourse.com  Fact Sheet for Healthcare Providers: SeriousBroker.it  This test is not yet approved or cleared by the [place]  FDA and has been authorized for detection and/or diagnosis of SARS-CoV-2 by FDA under an Emergency Use Authorization (EUA). This EUA will remain in effect (meaning this test can be used) for the duration of the COVID-19 declaration under Section 564(b)(1) of the Act, 21 U.S.C. section 360bbb-3(b)(1), unless the authorization is terminated or revoked.  Performed at [facility], [address], [place], [place] [address]   Blood culture (routine x 2)     Status: None (Preliminary result)   Collection Time: 06/05/24  8:51 PM   Specimen: BLOOD  Result Value Ref Range Status   Specimen Description   Final    BLOOD LEFT ANTECUBITAL Performed at [facility], [address] [place], [place] [address]    Special Requests   Final    Blood Culture adequate volume BOTTLES DRAWN AEROBIC AND ANAEROBIC Performed at [facility], [address] [place], [place] [address]    Culture   Final    NO GROWTH 4 DAYS Performed at [facility], [address] [place], [place] [address]    Report Status PENDING  Incomplete    Please note: You were cared for by a hospitalist during your hospital stay. Once you are discharged, your primary care physician will handle any further medical issues.  Please note that NO REFILLS for any discharge medications will be authorized once you are discharged, as it is imperative that you return to your primary care physician (or establish a relationship with a primary care physician if you do not have one) for your post hospital discharge needs so that they can reassess your need for medications and monitor your lab values.    Time coordinating discharge: 40 minutes  SIGNED:   Ivonne Mustache, MD  [facility] 06/09/2024, 11:13 AM Pager [phone]  If 7PM-7AM, please contact night-coverage [email or web address] Password [email or web address]

## 2024-06-09 NOTE — Progress Notes (Addendum)
 Rounding Note   Patient Name: Faith Gray Date of Encounter: 06/09/2024   [facility]Care Cardiologist: Toribio Fuel, MD   Subjective  Denies any chest pain or shortness of breath.  She is anxious to go home.  She says she does not want any further procedures done.  Scheduled Meds:  apixaban   2.5 mg Oral BID   atorvastatin   80 mg Oral q1800   empagliflozin   10 mg Oral Daily   ezetimibe   10 mg Oral QPM   sodium chloride  flush  3 mL Intravenous Q12H   Continuous Infusions:  PRN Meds: acetaminophen  **OR** acetaminophen , hydrALAZINE , melatonin, ondansetron  **OR** ondansetron  (ZOFRAN ) IV, senna-docusate   Vital Signs  Vitals:   06/08/24 0524 06/08/24 1443 06/08/24 2014 06/09/24 0502  BP: (!) 96/57 103/73 96/71 (!) 91/53  Pulse: 63 (!) 101 71 64  Resp: 18 16 17 17   Temp: 97.7 F (36.5 C) 98.8 F (37.1 C) 98.4 F (36.9 C) 97.9 F (36.6 C)  TempSrc: Oral Oral Oral Oral  SpO2: 99% 90% 100% 100%  Weight:      Height:        Intake/Output Summary (Last 24 hours) at 06/09/2024 1011 Last data filed at 06/08/2024 1900 Gross per 24 hour  Intake 840 ml  Output --  Net 840 ml      06/06/2024    2:44 AM 01/09/2024    2:38 PM 12/15/2023    5:53 AM  Last 3 Weights  Weight (lbs) 103 lb 100 lb 6.4 oz 100 lb 3.2 oz  Weight (kg) 46.72 kg 45.541 kg 45.45 kg      Telemetry Normal sinus rhythm with ventricular bigeminy overnight but maintaining sinus rhythm today on telemetry- Personally Reviewed  ECG  No new tracings - Personally Reviewed  Physical Exam  GEN: Well nourished, well developed in no acute distress HEENT: Normal NECK: No JVD; No carotid bruits LYMPHATICS: No lymphadenopathy CARDIAC:RRR, no rubs, gallops loud harsh 3/6 holosystolic murmur at the left lower sternal border to the apex RESPIRATORY:  Clear to auscultation without rales, wheezing or rhonchi  ABDOMEN: Soft, non-tender, non-distended MUSCULOSKELETAL:  No edema; No deformity  SKIN: Warm and  dry NEUROLOGIC:  Alert and oriented x 3 PSYCHIATRIC:  Normal affect   Labs High Sensitivity Troponin:  No results for input(s): TROPONINIHS in the last 720 hours.   Chemistry Recent Labs  Lab 06/06/24 0837 06/07/24 0539 06/08/24 0548 06/09/24 0513  NA 139 137 135 136  K 4.1 4.8 4.8 4.4  CL 104 102 102 100  CO2 21* 22 20* 21*  GLUCOSE 94 124* 101* 101*  BUN 20 29* 40* 45*  CREATININE 1.62* 1.83* 1.82* 1.90*  CALCIUM  9.4 9.0 8.8* 9.2  MG 2.4  --   --   --   GFRNONAA 31* 27* 27* 26*  ANIONGAP 15 13 14 15     Lipids No results for input(s): CHOL, TRIG, HDL, LABVLDL, LDLCALC, CHOLHDL in the last 168 hours.  Hematology Recent Labs  Lab 06/05/24 2038 06/05/24 2206 06/06/24 0837 06/09/24 0513  WBC 5.6  --  4.5 5.2  RBC 2.92*  --  2.79* 2.92*  HGB 8.0* 8.5* 7.5* 8.1*  HCT 25.3* 25.0* 23.9* 25.6*  MCV 86.6  --  85.7 87.7  MCH 27.4  --  26.9 27.7  MCHC 31.6  --  31.4 31.6  RDW 13.9  --  13.7 13.7  PLT 312  --  296 345   Thyroid No results for input(s): TSH, FREET4 in the last  168 hours.  BNP Recent Labs  Lab 06/05/24 2038 06/06/24 0837  PROBNP 8,968.0* 12,953.0*    DDimer No results for input(s): DDIMER in the last 168 hours.   Radiology  ECHOCARDIOGRAM COMPLETE Result Date: 06/08/2024    ECHOCARDIOGRAM REPORT   Patient Name:   Faith Gray Date of Exam: 06/08/2024 Medical Rec #:  [id number]     Height:       59.0 in Accession #:    [id number]    Weight:       103.0 lb Date of Birth:  1941-09-02     BSA:          1.391 m Patient Age:    83 years      BP:           96/57 mmHg Patient Gender: F             HR:           48 bpm. Exam Location:  Inpatient Procedure: 2D Echo (Both Spectral and Color Flow Doppler were utilized during            procedure). Indications:    CHF  History:        Patient has prior history of Echocardiogram examinations. CHF,                 CAD; Signs/Symptoms:Shortness of Breath.  Sonographer:    Norleen Amour Referring Phys: [phone]  KENNETH C HILTY IMPRESSIONS  1. The basal to mid inferior wall is aneurysmal. Left ventricular ejection fraction, by estimation, is 40 to 45%. Left ventricular ejection fraction by 2D MOD biplane is 41.1 %. The left ventricle has mildly decreased function. The left ventricle demonstrates regional wall motion abnormalities (see scoring diagram/findings for description). The left ventricular internal cavity size was mildly dilated. Left ventricular diastolic parameters are consistent with Grade II diastolic dysfunction (pseudonormalization). Elevated left ventricular end-diastolic pressure. The E/e' is 22. There is severe akinesis of the left ventricular, entire inferior wall.  2. Right ventricular systolic function is normal. The right ventricular size is normal. Tricuspid regurgitation signal is inadequate for assessing PA pressure.  3. Left atrial size was severely dilated.  4. 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 The mitral valve is degenerative. Severe mitral valve regurgitation.  5. The aortic valve is tricuspid. Aortic valve regurgitation is not visualized. Aortic valve sclerosis/calcification is present, without any evidence of aortic stenosis.  6. The inferior vena cava is normal in size with greater than 50% respiratory variability, suggesting right atrial pressure of 3 mmHg. Comparison(s): Changes from prior study are noted. 12/14/2023: LVEF 40-45%, grade 2 DD, moderate MR. FINDINGS  Left Ventricle: The basal to mid inferior wall is aneurysmal. Left ventricular ejection fraction, by estimation, is 40 to 45%. Left ventricular ejection fraction by 2D MOD biplane is 41.1 %. The left ventricle has mildly decreased function. The left ventricle demonstrates regional wall motion abnormalities. Severe akinesis of the left ventricular, entire inferior wall. The left ventricular internal cavity size was mildly dilated. There is no left ventricular  hypertrophy. Left ventricular diastolic parameters are consistent with Grade II diastolic dysfunction (pseudonormalization). Elevated left ventricular end-diastolic pressure. The E/e' is 22.  LV Wall Scoring: The posterior wall is aneurysmal. The apical lateral segment and apical inferior segment are hypokinetic. Right Ventricle: The right ventricular size is normal. No increase in right ventricular wall thickness. Right ventricular systolic function is normal. Tricuspid regurgitation signal is inadequate  for assessing PA pressure. Left Atrium: Left atrial size was severely dilated. Right Atrium: Right atrial size was normal in size. Pericardium: There is no evidence of pericardial effusion. 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 The mitral valve is degenerative in appearance. There is moderate calcification of the anterior and posterior mitral valve leaflet(s). Severe mitral valve regurgitation, with posteriorly-directed jet. MV peak gradient, 8.5 mmHg. The mean mitral valve gradient is 2.0 mmHg. Tricuspid Valve: The tricuspid valve is grossly normal. Tricuspid valve regurgitation is trivial. Aortic Valve: The aortic valve is tricuspid. Aortic valve regurgitation is not visualized. Aortic valve sclerosis/calcification is present, without any evidence of aortic stenosis. Aortic valve mean gradient measures 5.0 mmHg. Aortic valve peak gradient measures 10.1 mmHg. Aortic valve area, by VTI measures 0.52 cm. Pulmonic Valve: The pulmonic valve was grossly normal. Pulmonic valve regurgitation is trivial. Aorta: The aortic root and ascending aorta are structurally normal, with no evidence of dilitation. Venous: The inferior vena cava is normal in size with greater than 50% respiratory variability, suggesting right atrial pressure of 3 mmHg. IAS/Shunts: No atrial level shunt detected by color flow Doppler.  LEFT VENTRICLE PLAX 2D                         Biplane EF (MOD) LVIDd:         5.80 cm         LV Biplane EF:   Left LVIDs:         4.10 cm                          ventricular LV PW:         0.80 cm                          ejection LV IVS:        0.70 cm                          fraction by LVOT diam:     1.50 cm                          2D MOD LV SV:         18                               biplane is LV SV Index:   13                               41.1 %. LVOT Area:     1.77 cm                                Diastology                                LV e' medial:    6.42 cm/s LV Volumes (MOD)               LV E/e' medial:  21.2 LV vol d, MOD    224.0 ml  LV e' lateral:   5.98 cm/s A2C:                           LV E/e' lateral: 22.7 LV vol d, MOD    148.0 ml A4C: LV vol s, MOD    141.0 ml A2C: LV vol s, MOD    80.5 ml A4C: LV SV MOD A2C:   83.0 ml LV SV MOD A4C:   148.0 ml LV SV MOD BP:    79.2 ml RIGHT VENTRICLE             IVC RV Basal diam:  2.60 cm     IVC diam: 1.20 cm RV S prime:     12.80 cm/s TAPSE (M-mode): 2.3 cm LEFT ATRIUM            Index        RIGHT ATRIUM           Index LA diam:      4.70 cm  3.38 cm/m   RA Area:     11.00 cm LA Vol (A2C): 71.8 ml  51.61 ml/m  RA Volume:   25.10 ml  18.04 ml/m LA Vol (A4C): 101.0 ml 72.60 ml/m  AORTIC VALVE                     PULMONIC VALVE AV Area (Vmax):    0.63 cm      PV Vmax:       0.85 m/s AV Area (Vmean):   0.65 cm      PV Peak grad:  2.9 mmHg AV Area (VTI):     0.52 cm AV Vmax:           159.00 cm/s AV Vmean:          104.000 cm/s AV VTI:            0.353 m AV Peak Grad:      10.1 mmHg AV Mean Grad:      5.0 mmHg LVOT Vmax:         56.40 cm/s LVOT Vmean:        38.500 cm/s LVOT VTI:          0.104 m LVOT/AV VTI ratio: 0.29  AORTA Ao Root diam: 2.70 cm Ao Asc diam:  2.20 cm MITRAL VALVE MV Area (PHT): 1.79 cm       SHUNTS MV Area VTI:   0.37 cm       Systemic VTI:  0.10 m MV Peak grad:  8.5 mmHg       Systemic Diam: 1.50 cm MV Mean grad:  2.0 mmHg MV Vmax:       1.46 m/s MV  Vmean:      68.9 cm/s MV Decel Time: 424 msec MR Peak grad:    81.0 mmHg MR Mean grad:    50.0 mmHg MR Vmax:         450.00 cm/s MR Vmean:        337.0 cm/s MR PISA:         1.90 cm MR PISA Eff ROA: 13 mm MR PISA Radius:  0.55 cm MV E velocity: 136.00 cm/s MV A velocity: 87.50 cm/s MV E/A ratio:  1.55 Vinie Maxcy MD Electronically signed by Vinie Maxcy MD Signature Date/Time: 06/08/2024/12:25:50 PM    Final     Cardiac Studies  Echo 06/08/24:  1. The basal to mid inferior wall is aneurysmal. Left  ventricular  ejection fraction, by estimation, is 40 to 45%. Left ventricular ejection  fraction by 2D MOD biplane is 41.1 %. The left ventricle has mildly  decreased function. The left ventricle  demonstrates regional wall motion abnormalities (see scoring  diagram/findings for description). The left ventricular internal cavity  size was mildly dilated. Left ventricular diastolic parameters are  consistent with Grade II diastolic dysfunction  (pseudonormalization). Elevated left ventricular end-diastolic pressure.  The E/e' is 22. There is severe akinesis of the left ventricular, entire  inferior wall.   2. Right ventricular systolic function is normal. The right ventricular  size is normal. Tricuspid regurgitation signal is inadequate for assessing  PA pressure.   3. Left atrial size was severely dilated.   4. Significant splay is noted posteriorly -likely from tethered posterior  leaflet given inferior wall infarct. The mitral valve is degenerative.  Severe mitral valve regurgitation.   5. The aortic valve is tricuspid. Aortic valve regurgitation is not  visualized. Aortic valve sclerosis/calcification is present, without any  evidence of aortic stenosis.   6. The inferior vena cava is normal in size with greater than 50%  respiratory variability, suggesting right atrial pressure of 3 mmHg.   Patient Profile   83 y.o. 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  Assessment & Plan   Acute on chronic systolic heart failure ICM Hx of LV aneurysm AKI Anemia - Has history of ischemic cardiomyopathy/chronic HFrEF with EF 40 to 45% by echo March 2025.  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 - I had a long talk with her today about possibly pursuing right heart cath given her worsening renal function and hypotension despite holding diuretics - We discussed the right heart cath and she is adamant she does not want to pursue any further procedures or surgery on her mitral valve and wants to go home.  She has stated that she is 81 and does not even weigh 100 pounds and just wants to be medically managed - Recommend palliative care consult - Continue apixaban  2.5 mg twice daily (dosed for age greater than 80 and weight less than 60 kg) - Given her worsening renal function and hypotension would not restart Lasix  at this time.  Recommend repeating BMET outpatient and if renal function has improved then restart low-dose diuretics at that time - Given hypotension will hold Jardiance   Severe MR - appears her MR has progressed and likely  contributing to current volume issues and symptoms - she states she is not interested in surgery including mitra-clip and is not interested in any further invasive procedures - she is amenable to talking with palliative care>> recommend consult  Cardiology has no further recommendations this time and will sign off.  We will get her in with Dr. Bensimhon in the next week   [facility] will sign off.  Medication Recommendations:  no diuretics until seen back in clinic Other recommendations (labs, testing, etc):  BMET in 1 week Follow up as an outpatient:  Dr. Bensimhon or  extender in 1 week  I spent 30 minutes caring for this patient today face to face, ordering and reviewing labs, reviewing records from 2D echo , seeing the patient, discussing right heart cath and TEE and documenting in the record  Signed  Wilbert Bihari, MD [facility] 06/09/2024   For questions or updates, please contact  [facility] Please consult [email or web address] for contact info under

## 2024-06-09 NOTE — TOC Transition Note (Signed)
 Transition of Care [facility]) - Discharge Note  Patient Details  Name: Faith Gray MRN: [id number] Date of Birth: 1941/04/14  [facility] CM/SW Contact:  [name], LCSW Phone Number: 06/09/2024, 11:18 AM  Clinical Narrative: Care management consulted for outpatient palliative. CSW offered choice and patient chose [facility]. CSW made OP palliative referral to Amy and Melissa with [facility]. Care management signing off.  Final next level of care: Home/Self Care Barriers to Discharge: Barriers Resolved  Patient Goals and CMS Choice Patient states their goals for this hospitalization and ongoing recovery are:: Get OP palliative services CMS Medicare.gov Compare Post Acute Care list provided to:: Patient Choice offered to / list presented to : Patient  Discharge Plan and Services Additional resources added to the After Visit Summary for   In-house Referral: Clinical Social Work     DME Arranged: N/A DME Agency: NA  Social Drivers of Health (SDOH) Interventions SDOH Screenings   Food Insecurity: No Food Insecurity (06/06/2024)  Housing: Low Risk  (06/06/2024)  Transportation Needs: No Transportation Needs (06/06/2024)  Utilities: Not At Risk (06/06/2024)  Social Connections: Moderately Integrated (06/06/2024)  Tobacco Use: Medium Risk (06/06/2024)   Readmission Risk Interventions     No data to display

## 2024-06-09 NOTE — Plan of Care (Signed)

## 2024-06-09 NOTE — Progress Notes (Signed)
 Mobility Specialist - Progress Note   06/09/24 1105  Mobility  Activity Ambulated independently  Level of Assistance Independent  Assistive Device None  Distance Ambulated (ft) 400 ft  Range of Motion/Exercises Active  Activity Response Tolerated well  Mobility Referral Yes  Mobility visit 1 Mobility  Mobility Specialist Start Time (ACUTE ONLY) 1055  Mobility Specialist Stop Time (ACUTE ONLY) 1105  Mobility Specialist Time Calculation (min) (ACUTE ONLY) 10 min   Pt was found in bed and agreeable to mobilize. No complaints. At EOS returned to room with all needs met.   Erminio Leos,  Mobility Specialist Can be reached via Secure Chat

## 2024-06-09 NOTE — Progress Notes (Signed)
 Discharge instructions given to patient questions asked and answered. Daughter Crystal asked for MD to call her. DR Barbar notified.

## 2024-06-09 NOTE — Progress Notes (Signed)
 Discharge mediation delivered to patient at bedside in a secure bag.

## 2024-06-10 LAB — CULTURE, BLOOD (ROUTINE X 2)
Culture: NO GROWTH
Culture: NO GROWTH
Special Requests: ADEQUATE
Special Requests: ADEQUATE

## 2024-06-16 ENCOUNTER — Telehealth (HOSPITAL_COMMUNITY): Payer: Self-pay

## 2024-06-16 NOTE — Telephone Encounter (Signed)
 Called to confirm/remind patient of [facility] appointment [facility] on 06/17/24 11:00.   Appointment:   [x] Confirmed  [] Left mess   [] No answer/No voice mail  [] VM Full/unable to leave message  [] Phone not in service  Patient reminded to bring all medications and/or complete list.  Confirmed patient has transportation. Gave directions, instructed to utilize valet parking.

## 2024-06-16 NOTE — Progress Notes (Incomplete)
 ADVANCED HEART FAILURE CLINIC NOTE  Patient ID: Faith Gray, female   DOB: 04-Dec-1940, 83 y.o.   MRN: [id number]  PCP: Loris Elsie JINNY DEVONNA HF Cardiologist: Dr. 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    Admitted to [facility] 12/13 for new onset acute systolic HF. Echo showed EF 25-30% with diffuse hypokinesis. Mild MR.   Admitted to [facility] 6/14 for near syncope after taking hydralazine  early and jumping up quickly. Work up negative. Echo showed EF improved to 40-45%, Severe hypokinesis base inferolateral. Akinesis base/mid inferior wall. 30 day Event monitor placed 03/11/13, showing no significant arrhythmias.  Last seen 03/2022.  Admitted 3/25 with a/c HF. Echo showed EF 40% with moderate to severe MR. Diuresed with IV lasix . Discharged home, weight 100 lbs.  Admitted 9/25 with A/C HFrEF. Diuresed with IV lasix  but eventually SCr began to rise and she was hypotensive. Echo showed EF 40-45%, LV with RWMA, GIIDD, RV normal, LA severely dilated, significant splay noted posteriorly, likely from tethered posterior leaflet given inferior wall infarct. Severe MR. Elected to not have any more invasive procedures and declined RHC and she was not interested in mitra-clip. Palliative care consult recommended at discharge, plan for OP f/u. HF meds discontinued 2/2 hypotension and elevated renal function.   Today she returns for post hospital follow up. Overall feeling ***. Denies palpitations, CP, dizziness, edema, or PND/Orthopnea. *** SOB. Appetite ok. No fever or chills. Weight at home *** pounds. Taking all medications. Denies ETOH, tobacco or drug use.    Cardiac Studies  - Echo 9/25 EF 40-45%, LV with RWMA, GIIDD, RV normal, LA severely dilated, significant splay noted posteriorly, likely from tethered posterior leaflet given inferior  wall infarct. Severe MR. - Echo 3/25: EF 40%, moderate to severe MR - Echo 11/01/20: EF 30-35%  large aneurysm at the base to mid inferior wall. Mild to moderate MR.  - Echo 3/18: EF 30-35% large aneurysm at the base to mid inferior wall. - Echo 02/01/18: EF 35% large aneurysm at the base to mid inferior wall.  - Cath 08/2012:  Left main: Normal  LAD: Gives off 3 diagonals. Mild diffuse plaquing with 30-40% lesion in midsection. Otherwise normal.  LCX: Tiny ramus. Large OM-1 & OM-2. 30% lesion in ostial LCX. Otherwise mild plaque.  RCA: Dominant vessel with diffuse disease. Diffuse 30-40% through midsection. In distal RCA there is a ruptured plaque with about 60% stenosis. PDA moderate diffuse disease. In distal RCA just prior to take-off of PLs there is a very focal area of myocardial bridging with severe systolic compression.  LV-gram done in the RAO projection: Ejection fraction = 25-30%. There is a large aneurysm at the base to mid inferior wall. Anterior wall mild HK. No significant MR noted.  Review of systems complete and found to be negative unless listed in HPI.    Past Medical History:  Diagnosis Date   Acute systolic heart failure (HCC) 09/09/2012   Anemia    Arthritis    CAD (coronary artery disease) 09/11/2012   Essential hypertension 10/04/2012   Ischemic cardiomyopathy    NSTEMI (non-ST elevated myocardial infarction) (HCC) 09/11/2012   Current Outpatient Medications  Medication Sig Dispense Refill   apixaban  (ELIQUIS ) 2.5 MG TABS tablet Take 1 tablet (2.5 mg total) by mouth 2 (two) times daily. 60 tablet 3  atorvastatin  (LIPITOR ) 80 MG tablet TAKE 1 TABLET BY MOUTH DAILY AT 6PM (Patient taking differently: Take 80 mg by mouth daily. TAKE 1 TABLET BY MOUTH DAILY AT 6PM) 90 tablet 1   empagliflozin  (JARDIANCE ) 10 MG TABS tablet Take 1 tablet (10 mg total) by mouth daily. 90 tablet 3   ezetimibe  (ZETIA ) 10 MG tablet Take 1 tablet (10 mg total) by mouth daily. (Patient taking  differently: Take 10 mg by mouth every evening.) 30 tablet 11   ferrous sulfate 325 (65 FE) MG EC tablet Take 325 mg by mouth daily after breakfast. (Patient not taking: Reported on 06/05/2024)     pregabalin  (LYRICA ) 25 MG capsule Take 25 mg by mouth as needed (nerve pain). (Patient not taking: Reported on 06/06/2024)     TYLENOL  500 MG tablet Take 500-1,000 mg by mouth every 6 (six) hours as needed for mild pain or headache.     No current facility-administered medications for this visit.   Allergies  Allergen Reactions   Vicodin [Hydrocodone-Acetaminophen ] Nausea And Vomiting    Wt Readings from Last 3 Encounters:  06/06/24 46.7 kg (103 lb)  01/09/24 45.5 kg (100 lb 6.4 oz)  12/15/23 45.5 kg (100 lb 3.2 oz)   There were no vitals taken for this visit.  PHYSICAL EXAM: General:  *** appearing.  No respiratory difficulty Neck: JVD *** cm.  Cor: Regular rate & rhythm. No murmurs. Lungs: clear Extremities: no edema  Neuro: alert & oriented x 3. Affect pleasant.   ECG (personally reviewed): NSR 68, artifact  Assessment/Plan 1. Chronic systolic HF:  - Ischemic cardiomyopathy. 2019:  EF ~35%  She has a large inferior wall aneurysm.  - Echo 2/22: EF 30-35%  large aneurysm at the base to mid inferior wall. Mild to moderate MR.  - Echo 3/25 EF ~40% with moderate MR. - Echo 9/25 EF 40-45%, LV with RWMA, GIIDD, RV normal, LA severely dilated, significant splay noted posteriorly, likely from tethered posterior leaflet given inferior wall infarct. Severe MR. - NYHA I-II, volume OK today. *** - Continue torsemide  20 mg daily. Off diuretics.  - Continue Entresto  97/103 (off) - Continue Imdur  30 mg daily. *(off) - Continue spiro 25 mg daily (off) - Continue carvedilol  25 mg bid (off) - Continue Jardiance  10 mg daily. No GU symptoms.   2. MR - mild to mod on echo 2/22 - moderate to severe on echo 3/25 - severe on echo 9/25 - suspect that this is the driver of her symptoms, as the EF  remains unchanged - MitraClip discussed during last admission, she was not interested.   3. CAD  - No chest pain - No ASA with AC - Continue atorvastatin  + ? blocker + zetia    4. Anemia - baseline hgb 7-8, likely related to CKD - CBC reviewed from PCP visit, Hgb stable at 9.4   5. CKD 3a - baseline SCr 1.5.  - Continue SGLT2i - Last SCr 1.9 - BMET today  6. LV aneurysm:   - Felt to be high-risk for embolic phenomenon  - Previously on coumadin , now on Eliquis  2.5 mg bid (reduced dose for age and weight) - No bleeding.    7. HTN - Stable.  - Meds as above.  8. MGUS - Followed by Dr Fernand.    Follow up in 2-3 months with Dr. Cherrie ***  Beckey LITTIE Coe AGACNP-BC  06/16/24

## 2024-06-17 ENCOUNTER — Encounter (HOSPITAL_COMMUNITY): Payer: Self-pay

## 2024-06-17 ENCOUNTER — Emergency Department (HOSPITAL_COMMUNITY)
Admission: EM | Admit: 2024-06-17 | Discharge: 2024-06-17 | Disposition: A | Discharge status: Home / Self Care | Attending: Emergency Medicine | Admitting: Emergency Medicine

## 2024-06-17 ENCOUNTER — Other Ambulatory Visit: Payer: Self-pay

## 2024-06-17 ENCOUNTER — Encounter (HOSPITAL_COMMUNITY): Payer: Self-pay | Admitting: *Deleted

## 2024-06-17 ENCOUNTER — Ambulatory Visit (HOSPITAL_COMMUNITY)
Admit: 2024-06-17 | Discharge: 2024-06-17 | Disposition: A | Discharge status: Home / Self Care | Attending: Cardiology | Admitting: Cardiology

## 2024-06-17 ENCOUNTER — Ambulatory Visit (HOSPITAL_COMMUNITY): Payer: Self-pay | Admitting: Cardiology

## 2024-06-17 VITALS — BP 130/82 | HR 85 | Ht 59.0 in | Wt 99.6 lb

## 2024-06-17 DIAGNOSIS — D649 Anemia, unspecified: Secondary | ICD-10-CM | POA: Diagnosis not present

## 2024-06-17 DIAGNOSIS — I253 Aneurysm of heart: Secondary | ICD-10-CM | POA: Insufficient documentation

## 2024-06-17 DIAGNOSIS — I255 Ischemic cardiomyopathy: Secondary | ICD-10-CM | POA: Insufficient documentation

## 2024-06-17 DIAGNOSIS — N1831 Chronic kidney disease, stage 3a: Secondary | ICD-10-CM | POA: Insufficient documentation

## 2024-06-17 DIAGNOSIS — I2510 Atherosclerotic heart disease of native coronary artery without angina pectoris: Secondary | ICD-10-CM | POA: Insufficient documentation

## 2024-06-17 DIAGNOSIS — Z87891 Personal history of nicotine dependence: Secondary | ICD-10-CM | POA: Insufficient documentation

## 2024-06-17 DIAGNOSIS — Z7901 Long term (current) use of anticoagulants: Secondary | ICD-10-CM | POA: Insufficient documentation

## 2024-06-17 DIAGNOSIS — I130 Hypertensive heart and chronic kidney disease with heart failure and stage 1 through stage 4 chronic kidney disease, or unspecified chronic kidney disease: Secondary | ICD-10-CM | POA: Insufficient documentation

## 2024-06-17 DIAGNOSIS — I110 Hypertensive heart disease with heart failure: Secondary | ICD-10-CM | POA: Diagnosis not present

## 2024-06-17 DIAGNOSIS — D472 Monoclonal gammopathy: Secondary | ICD-10-CM | POA: Insufficient documentation

## 2024-06-17 DIAGNOSIS — I5022 Chronic systolic (congestive) heart failure: Secondary | ICD-10-CM | POA: Diagnosis present

## 2024-06-17 DIAGNOSIS — R799 Abnormal finding of blood chemistry, unspecified: Secondary | ICD-10-CM | POA: Diagnosis present

## 2024-06-17 DIAGNOSIS — D631 Anemia in chronic kidney disease: Secondary | ICD-10-CM | POA: Insufficient documentation

## 2024-06-17 DIAGNOSIS — I340 Nonrheumatic mitral (valve) insufficiency: Secondary | ICD-10-CM | POA: Insufficient documentation

## 2024-06-17 DIAGNOSIS — I509 Heart failure, unspecified: Secondary | ICD-10-CM | POA: Insufficient documentation

## 2024-06-17 DIAGNOSIS — Z7984 Long term (current) use of oral hypoglycemic drugs: Secondary | ICD-10-CM | POA: Insufficient documentation

## 2024-06-17 DIAGNOSIS — E875 Hyperkalemia: Secondary | ICD-10-CM | POA: Insufficient documentation

## 2024-06-17 LAB — BASIC METABOLIC PANEL WITH GFR
Anion gap: 11 (ref 5–15)
Anion gap: 11 (ref 5–15)
BUN: 38 mg/dL — ABNORMAL HIGH (ref 8–23)
BUN: 41 mg/dL — ABNORMAL HIGH (ref 8–23)
CO2: 20 mmol/L — ABNORMAL LOW (ref 22–32)
CO2: 21 mmol/L — ABNORMAL LOW (ref 22–32)
Calcium: 9.2 mg/dL (ref 8.9–10.3)
Calcium: 9.2 mg/dL (ref 8.9–10.3)
Chloride: 107 mmol/L (ref 98–111)
Chloride: 105 mmol/L (ref 98–111)
Creatinine, Ser: 1.66 mg/dL — ABNORMAL HIGH (ref 0.44–1.00)
Creatinine, Ser: 1.74 mg/dL — ABNORMAL HIGH (ref 0.44–1.00)
GFR, Estimated: 31 mL/min — ABNORMAL LOW (ref >60)
GFR, Estimated: 29 mL/min — ABNORMAL LOW (ref >60)
Glucose, Bld: 96 mg/dL (ref 70–99)
Glucose, Bld: 105 mg/dL — ABNORMAL HIGH (ref 70–99)
Potassium: 5.8 mmol/L — ABNORMAL HIGH (ref 3.5–5.1)
Potassium: 5.9 mmol/L — ABNORMAL HIGH (ref 3.5–5.1)
Sodium: 138 mmol/L (ref 135–145)
Sodium: 137 mmol/L (ref 135–145)

## 2024-06-17 LAB — CBC WITH DIFFERENTIAL/PLATELET
Abs Immature Granulocytes: 0.01 K/uL (ref 0.00–0.07)
Basophils Absolute: 0 K/uL (ref 0.0–0.1)
Basophils Relative: 1 %
Eosinophils Absolute: 0.2 K/uL (ref 0.0–0.5)
Eosinophils Relative: 4 %
HCT: 22.8 % — ABNORMAL LOW (ref 36.0–46.0)
Hemoglobin: 7.1 g/dL — ABNORMAL LOW (ref 12.0–15.0)
Immature Granulocytes: 0 %
Lymphocytes Relative: 26 %
Lymphs Abs: 1.5 K/uL (ref 0.7–4.0)
MCH: 27.7 pg (ref 26.0–34.0)
MCHC: 31.1 g/dL (ref 30.0–36.0)
MCV: 89.1 fL (ref 80.0–100.0)
Monocytes Absolute: 0.6 K/uL (ref 0.1–1.0)
Monocytes Relative: 11 %
Neutro Abs: 3.3 K/uL (ref 1.7–7.7)
Neutrophils Relative %: 58 %
Platelets: 334 K/uL (ref 150–400)
RBC: 2.56 MIL/uL — ABNORMAL LOW (ref 3.87–5.11)
RDW: 14.3 % (ref 11.5–15.5)
WBC: 5.7 K/uL (ref 4.0–10.5)
nRBC: 0 % (ref 0.0–0.2)

## 2024-06-17 MED ORDER — SODIUM ZIRCONIUM CYCLOSILICATE 10 G PO PACK
10.0000 g | PACK | Freq: Once | ORAL | Status: AC
  Administered 2024-06-17: 10 g via ORAL
  Filled 2024-06-17: qty 1

## 2024-06-17 NOTE — ED Triage Notes (Signed)
 Pt was seen by Dr. Bettyjane earlier and had blood work.  Her K was 5.8 and she was advised to take 40mg  po lasix  which she took and told to then come to ER for recheck of K to see if it has come down.  No distress in triage.

## 2024-06-17 NOTE — ED Triage Notes (Signed)
 Pt states she had lab work done earlier today and was directed to come to the ED due to hyperkalemia; denies pain

## 2024-06-17 NOTE — Discharge Instructions (Signed)
 You need to get your potassium rechecked by your PCP in the next 1-2 days.

## 2024-06-17 NOTE — Progress Notes (Signed)
 ADVANCED HEART FAILURE CLINIC NOTE  Patient ID: Faith Gray, female   DOB: Apr 27, 1941, 83 y.o.   MRN: [id number]  PCP: Loris Elsie JINNY DEVONNA HF Cardiologist: Dr. 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    Admitted to [facility] 12/13 for new onset acute systolic HF. Echo showed EF 25-30% with diffuse hypokinesis. Mild MR.   Admitted to [facility] 6/14 for near syncope after taking hydralazine  early and jumping up quickly. Work up negative. Echo showed EF improved to 40-45%, Severe hypokinesis base inferolateral. Akinesis base/mid inferior wall. 30 day Event monitor placed 03/11/13, showing no significant arrhythmias.  Admitted 3/25 with a/c HF. Echo showed EF 40% with moderate to severe MR. Diuresed with IV lasix . Discharged home, weight 100 lbs.  Admitted 9/25 with A/C HFrEF. Diuresed with IV lasix  but eventually SCr began to rise and she was hypotensive. Echo showed EF 40-45%, LV with RWMA, GIIDD, RV normal, LA severely dilated, significant splay noted posteriorly, likely from tethered posterior leaflet given inferior wall infarct. Severe MR. Elected to not have any more invasive procedures and declined RHC and she was not interested in mitra-clip. Palliative care consult recommended at discharge, plan for OP f/u. Most of all HF meds discontinued 2/2 hypotension and elevated renal function. She was continued only on Jardiance  and Eliquis . D/c wt 103 lb. She was set up w/ [facility] for palliative care support.   Today she returns for post hospital follow up. Here w/ grandson. Reports feeling better. Denies resting dyspnea. Still SOB w/ ADLs but overall improved, NYHA II-early III. Checking both wt and BP at home. Wt has been stable and down 5 lb. She has not had to use PRN  torsemide . She denies any issues w/ hypotension. SBPs at home in the 130s. No orthostatic symptoms.   ReDs 31% today, normal. Clinic BP 130/82. Pulse rate 85 bpm.    Cardiac Studies  - Echo 9/25 EF 40-45%, LV with RWMA, GIIDD, RV normal, LA severely dilated, significant splay noted posteriorly, likely from tethered posterior leaflet given inferior wall infarct. Severe MR. - Echo 3/25: EF 40%, moderate to severe MR - Echo 11/01/20: EF 30-35%  large aneurysm at the base to mid inferior wall. Mild to moderate MR.  - Echo 3/18: EF 30-35% large aneurysm at the base to mid inferior wall. - Echo 02/01/18: EF 35% large aneurysm at the base to mid inferior wall.  - Cath 08/2012:  Left main: Normal  LAD: Gives off 3 diagonals. Mild diffuse plaquing with 30-40% lesion in midsection. Otherwise normal.  LCX: Tiny ramus. Large OM-1 & OM-2. 30% lesion in ostial LCX. Otherwise mild plaque.  RCA: Dominant vessel with diffuse disease. Diffuse 30-40% through midsection. In distal RCA there is a ruptured plaque with about 60% stenosis. PDA moderate diffuse disease. In distal RCA just prior to take-off of PLs there is a very focal area of myocardial bridging with severe systolic compression.  LV-gram done in the RAO projection: Ejection fraction = 25-30%. There is a large aneurysm at the base to mid inferior wall. Anterior wall mild HK. No significant MR noted.  Review of systems complete and found to be negative unless listed in HPI.    Past Medical History:  Diagnosis Date   Acute systolic heart failure (HCC)  09/09/2012   Anemia    Arthritis    CAD (coronary artery disease) 09/11/2012   Essential hypertension 10/04/2012   Ischemic cardiomyopathy    NSTEMI (non-ST elevated myocardial infarction) (HCC) 09/11/2012   Current Outpatient Medications  Medication Sig Dispense Refill   apixaban  (ELIQUIS ) 2.5 MG TABS tablet Take 1 tablet (2.5 mg total) by mouth 2 (two) times daily. 60 tablet 3   atorvastatin   (LIPITOR ) 80 MG tablet TAKE 1 TABLET BY MOUTH DAILY AT 6PM 90 tablet 1   empagliflozin  (JARDIANCE ) 10 MG TABS tablet Take 1 tablet (10 mg total) by mouth daily. 90 tablet 3   ezetimibe  (ZETIA ) 10 MG tablet Take 1 tablet (10 mg total) by mouth daily. 30 tablet 11   ferrous sulfate 325 (65 FE) MG EC tablet Take 325 mg by mouth daily after breakfast.     pregabalin  (LYRICA ) 25 MG capsule Take 25 mg by mouth as needed (nerve pain).     TYLENOL  500 MG tablet Take 500-1,000 mg by mouth every 6 (six) hours as needed for mild pain or headache.     No current facility-administered medications for this encounter.   Allergies  Allergen Reactions   Vicodin [Hydrocodone-Acetaminophen ] Nausea And Vomiting    Wt Readings from Last 3 Encounters:  06/17/24 45.2 kg (99 lb 9.6 oz)  06/06/24 46.7 kg (103 lb)  01/09/24 45.5 kg (100 lb 6.4 oz)   BP 130/82   Pulse 85   Ht 4' 11 (1.499 m)   Wt 45.2 kg (99 lb 9.6 oz)   SpO2 100%   BMI 20.12 kg/m   PHYSICAL EXAM: General:  thin, elderly appearing female.  No respiratory difficulty Neck: JVD 5 cm Cor: Regular rate & rhythm. 3/6 MR murmur  Lungs: clear Extremities: no edema  Neuro: alert & oriented x 3. Affect pleasant.   ECG: not performed   Assessment/Plan 1. Chronic systolic HF:  - Ischemic cardiomyopathy. 2019:  EF ~35%  She has a large inferior wall aneurysm.  - Echo 2/22: EF 30-35%  large aneurysm at the base to mid inferior wall. Mild to moderate MR.  - Echo 3/25 EF ~40% with moderate MR. - Echo 9/25 EF 40-45%, LV with RWMA, GIIDD, RV normal, LA severely dilated, significant splay noted posteriorly, likely from tethered posterior leaflet given inferior wall infarct. Severe MR. - NYHA II-early III, overall improved. Euvolemic on exam and by ReDS 31% - Continue torsemide  PRN  - Off Entresto , Imdur , Spiro and Coreg  due to hypotension and AKI recent admission. BP in clinic today 130/80, correlating w/ patient's home BP readings since discharge.  She denies any further hypotension. Will check BMP today and pending SCr will plan to add back low dose GDMT to help w/ HF optimization/afterload reduction. If SCr will not permit restart of ARB/MRA, can add back low dose Coreg  to start - Continue Jardiance  10 mg daily. - refer to outpatient palliative care          2. MR - mild to mod on echo 2/22 - moderate to severe on echo 3/25 - severe on echo 9/25 - suspect that this is the driver of her symptoms, as the EF remains unchanged - MitraClip discussed during last admission, she was not interested. - medical management w/ HF optimization per above    3. CAD  - denies CP  - No ASA with AC - Continue atorvastatin  + zetia    4. Anemia - baseline hgb 7-8, likely AKI d/t CKD  - CBC last  wk stable at 8.1  - she denies gross bleeding    5. CKD 3a - baseline SCr 1.5. SCr when discharged last wk was up to 1.9  - cardiorenal in setting of severe MR - Continue SGLT2i - BMET today  6. LV aneurysm:   - Felt to be high-risk for embolic phenomenon  - Previously on coumadin , now on Eliquis  2.5 mg bid (reduced dose for age and weight)    7. HTN - Stable/normotensive  - Meds as above.  8. MGUS - Followed by Dr Fernand.   F/u w/ APP in 4 wks   Verna Hamon Marcine RIGGERS 06/17/24

## 2024-06-17 NOTE — ED Provider Triage Note (Signed)
 Emergency Medicine Provider Triage Evaluation Note  Faith Gray , a 83 y.o. female  was evaluated in triage.  Pt complains of abnormal labs. Pt was seen by cardiology for a f/u appointment and had labs drawn today.  Was notified that K+ is elevated and to take 40mg  of Lasix  and come to the ER to have her labs recheck. Pt without any complaint.   Review of Systems  Positive: As above Negative: As above  Physical Exam  BP 120/84   Pulse 87   Temp 97.8 F (36.6 C) (Oral)   Resp 16   SpO2 100%  Gen:   Awake, no distress   Resp:  Normal effort  MSK:   Moves extremities without difficulty  Other:    Medical Decision Making  Medically screening exam initiated at 6:15 PM.  Appropriate orders placed.  Kynisha Lunsford was informed that the remainder of the evaluation will be completed by another provider, this initial triage assessment does not replace that evaluation, and the importance of remaining in the ED until their evaluation is complete.     Nivia Colon, PA-C 06/17/24 1816

## 2024-06-17 NOTE — Patient Instructions (Signed)
 Medication Changes:  No Changes In Medications at this time.   Lab Work:  Labs done today, your results will be available in MyChart, we will contact you for abnormal readings.  Special Instructions // Education:  PLEASE CALL YOUR PRIMARY CARE OFFICE REGARDING PALLIATIVE CARE (IT LOOKS LIKE THEIR OFFICE PLACED THE REFERRAL) PLEASE CALL US  IF YOU HAVE ANY ISSUES WITH THIS  Follow-Up in: 4 WEEKS AS SCHEDULED   At the [facility], you and your health needs are our priority. We have a designated team specialized in the treatment of Heart Failure. This Care Team includes your primary Heart Failure Specialized Cardiologist (physician), Advanced Practice Providers (APPs- Physician Assistants and Nurse Practitioners), and Pharmacist who all work together to provide you with the care you need, when you need it.   You may see any of the following providers on your designated Care Team at your next follow up:  Dr. Toribio Fuel Dr. Ezra Shuck Dr. Ria Commander Dr. Odis Brownie Greig Mosses, NP Caffie Shed, [place] [facility] [facility], [place] Beckey Coe, NP [place] Lee, NP Tinnie Redman, PharmD   Please be sure to bring in all your medications bottles to every appointment.   Need to Contact Us :  If you have any questions or concerns before your next appointment please send us  a message through [place] or call our office at [phone].    TO LEAVE A MESSAGE FOR THE NURSE SELECT OPTION 2, PLEASE LEAVE A MESSAGE INCLUDING: YOUR NAME DATE OF BIRTH CALL BACK NUMBER REASON FOR CALL**this is important as we prioritize the call backs  YOU WILL RECEIVE A CALL BACK THE SAME DAY AS LONG AS YOU CALL BEFORE 4:00 PM

## 2024-06-17 NOTE — Progress Notes (Signed)
 ReDS Vest / Clip - 06/17/24 1100       ReDS Vest / Clip   Station Marker A    Ruler Value 24    ReDS Value Range Low volume    ReDS Actual Value 31

## 2024-06-17 NOTE — ED Provider Notes (Signed)
 [place] EMERGENCY DEPARTMENT AT [facility] Provider Note   CSN: [id number] Arrival date & time: 06/17/24  1747     Patient presents with: Abnormal Lab   Faith Gray is a 83 y.o. female.   Pt is a 83 yo female with pmhx significant for hld, CAD, anemia, HTN, arthritis, and CHF.  Pt said she had blood work today with Dr. Bettyjane and K was elevated.  Pt was admitted from 9/18-22 for CHF.  While admitted, her k was elevated and she was given lokelma which improved the k.  Pt said she feels well.         Prior to Admission medications   Medication Sig Start Date End Date Taking? Authorizing Provider  apixaban  (ELIQUIS ) 2.5 MG TABS tablet Take 1 tablet (2.5 mg total) by mouth 2 (two) times daily. 06/09/24   Jillian Buttery, MD  atorvastatin  (LIPITOR ) 80 MG tablet TAKE 1 TABLET BY MOUTH DAILY AT 6PM 05/19/19   Bensimhon, Toribio SAUNDERS, MD  empagliflozin  (JARDIANCE ) 10 MG TABS tablet Take 1 tablet (10 mg total) by mouth daily. 01/09/24   Milford, Harlene HERO, FNP  ezetimibe  (ZETIA ) 10 MG tablet Take 1 tablet (10 mg total) by mouth daily. 01/09/23 06/17/24  Danford, Lonni SQUIBB, MD  ferrous sulfate 325 (65 FE) MG EC tablet Take 325 mg by mouth daily after breakfast.    [provider]  pregabalin  (LYRICA ) 25 MG capsule Take 25 mg by mouth as needed (nerve pain).    [provider]  TYLENOL  500 MG tablet Take 500-1,000 mg by mouth every 6 (six) hours as needed for mild pain or headache.    [provider]  furosemide  (LASIX ) 40 MG tablet Take 1 tablet (40 mg total) by mouth as needed. 02/05/18 04/13/20  Bensimhon, Toribio SAUNDERS, MD    Allergies: Vicodin [hydrocodone-acetaminophen ]    Review of Systems  All other systems reviewed and are negative.   Updated Vital Signs BP (!) 121/95 (BP Location: Right Arm)   Pulse 77   Temp 97.6 F (36.4 C) (Oral)   Resp 16   SpO2 100%   Physical Exam Vitals and nursing note reviewed.  Constitutional:      Appearance:  Normal appearance.  HENT:     Head: Normocephalic and atraumatic.     Right Ear: External ear normal.     Left Ear: External ear normal.     Nose: Nose normal.     Mouth/Throat:     Mouth: Mucous membranes are moist.     Pharynx: Oropharynx is clear.  Eyes:     Extraocular Movements: Extraocular movements intact.     Conjunctiva/sclera: Conjunctivae normal.     Pupils: Pupils are equal, round, and reactive to light.  Cardiovascular:     Rate and Rhythm: Normal rate and regular rhythm.     Pulses: Normal pulses.     Heart sounds: Normal heart sounds.  Pulmonary:     Effort: Pulmonary effort is normal.     Breath sounds: Normal breath sounds.  Abdominal:     General: Abdomen is flat. Bowel sounds are normal.     Palpations: Abdomen is soft.  Musculoskeletal:        General: Normal range of motion.     Cervical back: Normal range of motion and neck supple.  Skin:    General: Skin is warm.     Capillary Refill: Capillary refill takes less than 2 seconds.  Neurological:     General: No  focal deficit present.     Mental Status: She is alert and oriented to person, place, and time.  Psychiatric:        Mood and Affect: Mood normal.        Behavior: Behavior normal.     (all labs ordered are listed, but only abnormal results are displayed) Labs Reviewed  BASIC METABOLIC PANEL WITH GFR - Abnormal; Notable for the following components:      Result Value   Potassium 5.9 (*)    CO2 21 (*)    Glucose, Bld 105 (*)    BUN 41 (*)    Creatinine, Ser 1.74 (*)    GFR, Estimated 29 (*)    All other components within normal limits  CBC WITH DIFFERENTIAL/PLATELET - Abnormal; Notable for the following components:   RBC 2.56 (*)    Hemoglobin 7.1 (*)    HCT 22.8 (*)    All other components within normal limits    EKG: EKG Interpretation Date/Time:  Tuesday June 17 2024 18:31:01 EDT Ventricular Rate:  81 PR Interval:  134 QRS Duration:  90 QT Interval:  382 QTC  Calculation: 443 R Axis:   66  Text Interpretation: Normal sinus rhythm Cannot rule out Anterior infarct , age undetermined T wave abnormality, consider inferior ischemia Abnormal ECG When compared with ECG of 05-Jun-2024 18:36, PREVIOUS ECG IS PRESENT No significant change since last tracing Confirmed by Dean Clarity [phone]) on 06/17/2024 8:57:39 PM  Radiology: No results found.   Procedures   Medications Ordered in the ED  sodium zirconium cyclosilicate (LOKELMA) packet 10 g (10 g Oral Given 06/17/24 2116)                                    Medical Decision Making Risk Prescription drug management.   This patient presents to the ED for concern of hyperkalemia, this involves an extensive number of treatment options, and is a complaint that carries with it a high risk of complications and morbidity.  The differential diagnosis includes hyperkalemia   Co morbidities that complicate the patient evaluation  hld, CAD, anemia, HTN, arthritis, and CHF   Additional history obtained:  Additional history obtained from epic chart review External records from outside source obtained and reviewed including son   Lab Tests:  I Ordered, and personally interpreted labs.  The pertinent results include:  cbc with hgb low at 7.1 (stable) and bmp with k elevated at 5.9, bun 41 and cr 1.74 (cr 1.9 on 9/22)   Medicines ordered and prescription drug management:  I ordered medication including lokelma  for elevated k  Reevaluation of the patient after these medicines showed that the patient improved I have reviewed the patients home medicines and have made adjustments as needed  Critical Interventions:  lokelma   Problem List / ED Course:  Hyperkalemia:  pt given lokelma.  She has no EKG changes.  She is offered admission to monitor the K, but wants to go home.  She knows she needs to get her k checked in 1-2 days to make sure it is continuing to trend down.  Pt is stable for d/c.   Return if worse. Anemia: chronic   Reevaluation:  After the interventions noted above, I reevaluated the patient and found that they have :improved   Social Determinants of Health:  Lives at home   Dispostion:  After consideration of the diagnostic results and  the patients response to treatment, I feel that the patent would benefit from discharge with outpatient f/u.       Final diagnoses:  Hyperkalemia  Anemia, unspecified type    ED Discharge Orders     None          Dean Clarity, MD 06/17/24 2137

## 2024-06-18 ENCOUNTER — Encounter (HOSPITAL_COMMUNITY)

## 2024-06-20 ENCOUNTER — Telehealth (HOSPITAL_COMMUNITY): Payer: Self-pay | Admitting: *Deleted

## 2024-06-20 DIAGNOSIS — E875 Hyperkalemia: Secondary | ICD-10-CM

## 2024-06-20 DIAGNOSIS — I5022 Chronic systolic (congestive) heart failure: Secondary | ICD-10-CM

## 2024-06-20 NOTE — Telephone Encounter (Signed)
 Pt seen in ER 9/30 for hyperkalemia and treated with Lokelma, per DR Bensimhon needs repeat labs (bmet) early next week to check K.  Pt aware and agreeable, appt sch for 10/7

## 2024-06-22 ENCOUNTER — Inpatient Hospital Stay (HOSPITAL_COMMUNITY)
Admission: EM | Admit: 2024-06-22 | Discharge: 2024-06-26 | DRG: 377 | Disposition: A | Discharge status: Home / Self Care | Attending: Internal Medicine | Admitting: Internal Medicine

## 2024-06-22 ENCOUNTER — Emergency Department (HOSPITAL_COMMUNITY)

## 2024-06-22 ENCOUNTER — Other Ambulatory Visit: Payer: Self-pay

## 2024-06-22 ENCOUNTER — Encounter (HOSPITAL_COMMUNITY): Payer: Self-pay

## 2024-06-22 DIAGNOSIS — D62 Acute posthemorrhagic anemia: Secondary | ICD-10-CM | POA: Diagnosis present

## 2024-06-22 DIAGNOSIS — K5521 Angiodysplasia of colon with hemorrhage: Secondary | ICD-10-CM | POA: Diagnosis present

## 2024-06-22 DIAGNOSIS — R944 Abnormal results of kidney function studies: Secondary | ICD-10-CM | POA: Diagnosis present

## 2024-06-22 DIAGNOSIS — I252 Old myocardial infarction: Secondary | ICD-10-CM | POA: Diagnosis not present

## 2024-06-22 DIAGNOSIS — N1832 Chronic kidney disease, stage 3b: Secondary | ICD-10-CM | POA: Diagnosis present

## 2024-06-22 DIAGNOSIS — Z832 Family history of diseases of the blood and blood-forming organs and certain disorders involving the immune mechanism: Secondary | ICD-10-CM

## 2024-06-22 DIAGNOSIS — R5383 Other fatigue: Secondary | ICD-10-CM | POA: Diagnosis present

## 2024-06-22 DIAGNOSIS — I340 Nonrheumatic mitral (valve) insufficiency: Secondary | ICD-10-CM | POA: Diagnosis present

## 2024-06-22 DIAGNOSIS — I255 Ischemic cardiomyopathy: Secondary | ICD-10-CM | POA: Diagnosis present

## 2024-06-22 DIAGNOSIS — R531 Weakness: Secondary | ICD-10-CM | POA: Diagnosis present

## 2024-06-22 DIAGNOSIS — Z6820 Body mass index (BMI) 20.0-20.9, adult: Secondary | ICD-10-CM

## 2024-06-22 DIAGNOSIS — I2510 Atherosclerotic heart disease of native coronary artery without angina pectoris: Secondary | ICD-10-CM | POA: Diagnosis present

## 2024-06-22 DIAGNOSIS — I110 Hypertensive heart disease with heart failure: Secondary | ICD-10-CM | POA: Diagnosis not present

## 2024-06-22 DIAGNOSIS — R571 Hypovolemic shock: Secondary | ICD-10-CM | POA: Diagnosis present

## 2024-06-22 DIAGNOSIS — K5731 Diverticulosis of large intestine without perforation or abscess with bleeding: Secondary | ICD-10-CM | POA: Diagnosis present

## 2024-06-22 DIAGNOSIS — D6832 Hemorrhagic disorder due to extrinsic circulating anticoagulants: Secondary | ICD-10-CM | POA: Diagnosis present

## 2024-06-22 DIAGNOSIS — D125 Benign neoplasm of sigmoid colon: Secondary | ICD-10-CM | POA: Diagnosis present

## 2024-06-22 DIAGNOSIS — D472 Monoclonal gammopathy: Secondary | ICD-10-CM | POA: Diagnosis present

## 2024-06-22 DIAGNOSIS — R64 Cachexia: Secondary | ICD-10-CM | POA: Diagnosis present

## 2024-06-22 DIAGNOSIS — Z87891 Personal history of nicotine dependence: Secondary | ICD-10-CM

## 2024-06-22 DIAGNOSIS — D72829 Elevated white blood cell count, unspecified: Secondary | ICD-10-CM | POA: Diagnosis present

## 2024-06-22 DIAGNOSIS — I130 Hypertensive heart and chronic kidney disease with heart failure and stage 1 through stage 4 chronic kidney disease, or unspecified chronic kidney disease: Secondary | ICD-10-CM | POA: Diagnosis present

## 2024-06-22 DIAGNOSIS — K648 Other hemorrhoids: Secondary | ICD-10-CM | POA: Diagnosis present

## 2024-06-22 DIAGNOSIS — E785 Hyperlipidemia, unspecified: Secondary | ICD-10-CM | POA: Diagnosis present

## 2024-06-22 DIAGNOSIS — I5042 Chronic combined systolic (congestive) and diastolic (congestive) heart failure: Secondary | ICD-10-CM | POA: Diagnosis present

## 2024-06-22 DIAGNOSIS — K449 Diaphragmatic hernia without obstruction or gangrene: Secondary | ICD-10-CM | POA: Diagnosis not present

## 2024-06-22 DIAGNOSIS — Z7984 Long term (current) use of oral hypoglycemic drugs: Secondary | ICD-10-CM

## 2024-06-22 DIAGNOSIS — Z515 Encounter for palliative care: Secondary | ICD-10-CM | POA: Diagnosis not present

## 2024-06-22 DIAGNOSIS — K635 Polyp of colon: Secondary | ICD-10-CM | POA: Diagnosis not present

## 2024-06-22 DIAGNOSIS — I5043 Acute on chronic combined systolic (congestive) and diastolic (congestive) heart failure: Secondary | ICD-10-CM | POA: Diagnosis not present

## 2024-06-22 DIAGNOSIS — K625 Hemorrhage of anus and rectum: Secondary | ICD-10-CM | POA: Diagnosis not present

## 2024-06-22 DIAGNOSIS — D123 Benign neoplasm of transverse colon: Secondary | ICD-10-CM | POA: Diagnosis present

## 2024-06-22 DIAGNOSIS — Z7901 Long term (current) use of anticoagulants: Secondary | ICD-10-CM

## 2024-06-22 DIAGNOSIS — Z79899 Other long term (current) drug therapy: Secondary | ICD-10-CM

## 2024-06-22 DIAGNOSIS — I253 Aneurysm of heart: Secondary | ICD-10-CM | POA: Diagnosis present

## 2024-06-22 DIAGNOSIS — K31819 Angiodysplasia of stomach and duodenum without bleeding: Secondary | ICD-10-CM | POA: Diagnosis present

## 2024-06-22 DIAGNOSIS — K5520 Angiodysplasia of colon without hemorrhage: Secondary | ICD-10-CM

## 2024-06-22 DIAGNOSIS — K922 Gastrointestinal hemorrhage, unspecified: Secondary | ICD-10-CM | POA: Diagnosis present

## 2024-06-22 DIAGNOSIS — K5730 Diverticulosis of large intestine without perforation or abscess without bleeding: Secondary | ICD-10-CM | POA: Diagnosis not present

## 2024-06-22 DIAGNOSIS — I493 Ventricular premature depolarization: Secondary | ICD-10-CM | POA: Diagnosis present

## 2024-06-22 DIAGNOSIS — R55 Syncope and collapse: Secondary | ICD-10-CM | POA: Diagnosis present

## 2024-06-22 LAB — CBC WITH DIFFERENTIAL/PLATELET
Abs Immature Granulocytes: 0.08 K/uL — ABNORMAL HIGH (ref 0.00–0.07)
Basophils Absolute: 0 K/uL (ref 0.0–0.1)
Basophils Relative: 0 %
Eosinophils Absolute: 0 K/uL (ref 0.0–0.5)
Eosinophils Relative: 0 %
HCT: 13.2 % — ABNORMAL LOW (ref 36.0–46.0)
Hemoglobin: 4 g/dL — CL (ref 12.0–15.0)
Immature Granulocytes: 1 %
Lymphocytes Relative: 12 %
Lymphs Abs: 1.5 K/uL (ref 0.7–4.0)
MCH: 28.6 pg (ref 26.0–34.0)
MCHC: 30.3 g/dL (ref 30.0–36.0)
MCV: 94.3 fL (ref 80.0–100.0)
Monocytes Absolute: 0.5 K/uL (ref 0.1–1.0)
Monocytes Relative: 4 %
Neutro Abs: 9.8 K/uL — ABNORMAL HIGH (ref 1.7–7.7)
Neutrophils Relative %: 83 %
Platelets: 235 K/uL (ref 150–400)
RBC: 1.4 MIL/uL — ABNORMAL LOW (ref 3.87–5.11)
RDW: 14.9 % (ref 11.5–15.5)
WBC: 12 K/uL — ABNORMAL HIGH (ref 4.0–10.5)
nRBC: 0.3 % — ABNORMAL HIGH (ref 0.0–0.2)

## 2024-06-22 LAB — COMPREHENSIVE METABOLIC PANEL WITH GFR
ALT: 25 U/L (ref 0–44)
AST: 25 U/L (ref 15–41)
Albumin: 2.9 g/dL — ABNORMAL LOW (ref 3.5–5.0)
Alkaline Phosphatase: 70 U/L (ref 38–126)
Anion gap: 10 (ref 5–15)
BUN: 58 mg/dL — ABNORMAL HIGH (ref 8–23)
CO2: 16 mmol/L — ABNORMAL LOW (ref 22–32)
Calcium: 7.7 mg/dL — ABNORMAL LOW (ref 8.9–10.3)
Chloride: 110 mmol/L (ref 98–111)
Creatinine, Ser: 1.64 mg/dL — ABNORMAL HIGH (ref 0.44–1.00)
GFR, Estimated: 31 mL/min — ABNORMAL LOW (ref >60)
Glucose, Bld: 141 mg/dL — ABNORMAL HIGH (ref 70–99)
Potassium: 4.5 mmol/L (ref 3.5–5.1)
Sodium: 136 mmol/L (ref 135–145)
Total Bilirubin: 0.4 mg/dL (ref 0.0–1.2)
Total Protein: 6.1 g/dL — ABNORMAL LOW (ref 6.5–8.1)

## 2024-06-22 LAB — PROTIME-INR
INR: 1.3 — ABNORMAL HIGH (ref 0.8–1.2)
Prothrombin Time: 16.7 s — ABNORMAL HIGH (ref 11.4–15.2)

## 2024-06-22 LAB — PREPARE RBC (CROSSMATCH)

## 2024-06-22 LAB — HEMOGLOBIN AND HEMATOCRIT, BLOOD
HCT: 21.8 % — ABNORMAL LOW (ref 36.0–46.0)
Hemoglobin: 7 g/dL — ABNORMAL LOW (ref 12.0–15.0)

## 2024-06-22 MED ORDER — SODIUM CHLORIDE 0.9% IV SOLUTION: Freq: Once | INTRAVENOUS | Status: DC

## 2024-06-22 MED ORDER — EMPAGLIFLOZIN 10 MG PO TABS
10.0000 mg | ORAL_TABLET | Freq: Every day | ORAL | Status: DC
  Administered 2024-06-22 – 2024-06-26 (×4): 10 mg via ORAL
  Filled 2024-06-22 (×4): qty 1

## 2024-06-22 MED ORDER — ACETAMINOPHEN 500 MG PO TABS
500.0000 mg | ORAL_TABLET | Freq: Four times a day (QID) | ORAL | Status: DC | PRN
  Administered 2024-06-22 – 2024-06-26 (×6): 1000 mg via ORAL
  Filled 2024-06-22 (×6): qty 2

## 2024-06-22 MED ORDER — SODIUM CHLORIDE 0.9% IV SOLUTION
Freq: Once | INTRAVENOUS | Status: DC
Start: 2024-06-22 — End: 2024-06-22

## 2024-06-22 MED ORDER — FERROUS SULFATE 325 (65 FE) MG PO TABS
325.0000 mg | ORAL_TABLET | Freq: Every day | ORAL | Status: DC
  Administered 2024-06-23 – 2024-06-26 (×3): 325 mg via ORAL
  Filled 2024-06-22 (×3): qty 1

## 2024-06-22 MED ORDER — EZETIMIBE 10 MG PO TABS
10.0000 mg | ORAL_TABLET | Freq: Every day | ORAL | Status: DC
  Administered 2024-06-22 – 2024-06-26 (×5): 10 mg via ORAL
  Filled 2024-06-22 (×5): qty 1

## 2024-06-22 MED ORDER — ATORVASTATIN CALCIUM 40 MG PO TABS
40.0000 mg | ORAL_TABLET | Freq: Every day | ORAL | Status: DC
  Administered 2024-06-22 – 2024-06-25 (×4): 40 mg via ORAL
  Filled 2024-06-22 (×4): qty 1

## 2024-06-22 NOTE — ED Notes (Signed)
 MD notified of Hg result of 4

## 2024-06-22 NOTE — H&P (Signed)
 History and Physical    Faith Gray FMW:[id number] DOB: 10/29/1940 DOA: 06/22/2024  PCP: Loris Elsie PARAS, PA-C   Patient coming from: Home  Chief Complaint: Hematochezia  HPI: Faith Gray is a 83 y.o. female with medical history significant of ischemic cardiomyopathy with EF of 45%, left ventricular aneurysm on eliquis , coronary artery disease who presented to the emergency room with complaints of 6 episodes of hematochezia.  She was apparently doing well until yesterday afternoon.  She started having multiple episodes of hematochezia with bright red blood per rectum since 3 PM yesterday.  She had total of 6 episodes.  She felt weak , felt that she was about to faint.  Also had some dyspnea.  When EMS arrived the blood pressure was in the range of 80s systolic.  She was given 500 mL of fluid bolus. That increased her blood pressure to the range of 110s systolic.Patient takes Eliquis  for history of left ventricular aneurysm. She was recently admitted and was discharged from a [facility] after she was managed for acute on chronic HFrEF.  She lives alone.  She is ambulatory without any problem. Patient seen and examined at the bedside in the emergency department.  During my evaluation, she was alert and oriented, overall comfortable.  Blood pressure was in the range of 100s systolic. She denied any chest pain, shortness of breath, palpitation, abdominal pain, nausea, vomiting, diarrhea or dysuria.  She reports that her hematochezia is better, the last episodes were of lower volume.  ED Course: Blood pressure in the range of 100s systolic, no tachycardia.  Hemoglobin found to be in the range of 4.  Ordered 2 units of blood transfusion.  Lab work showed creatinine of 1.6.  Chest x-ray did not show any acute findings.  GI consulted for further evaluation of lower GI bleed.  Review of Systems: As per HPI otherwise 10 point review of systems negative.    Past Medical History:  Diagnosis  Date   Acute systolic heart failure (HCC) 09/09/2012   Anemia    Arthritis    CAD (coronary artery disease) 09/11/2012   Essential hypertension 10/04/2012   Ischemic cardiomyopathy    NSTEMI (non-ST elevated myocardial infarction) (HCC) 09/11/2012    Past Surgical History:  Procedure Laterality Date   CARDIAC CATHETERIZATION     LEFT AND RIGHT HEART CATHETERIZATION WITH CORONARY ANGIOGRAM N/A 09/10/2012   Procedure: LEFT AND RIGHT HEART CATHETERIZATION WITH CORONARY ANGIOGRAM;  Surgeon: Toribio JONELLE Fuel, MD;  Location: [facility] CATH LAB;  Service: Cardiovascular;  Laterality: N/A;   MULTIPLE EXTRACTIONS WITH ALVEOLOPLASTY N/A 02/16/2017   Procedure: MULTIPLE EXTRACTION WITH ALVEOLOPLASTY;  Surgeon: Sheryle Hamilton, DDS;  Location: [facility] OR;  Service: Oral Surgery;  Laterality: N/A;     reports that she has quit smoking. Her smoking use included cigarettes. She has a 10 pack-year smoking history. She has never used smokeless tobacco. She reports that she does not drink alcohol and does not use drugs.  Allergies  Allergen Reactions   Vicodin [Hydrocodone-Acetaminophen ] Nausea And Vomiting    Family History  Problem Relation Age of Onset   Sickle cell anemia Brother      Prior to Admission medications   Medication Sig Start Date End Date Taking? Authorizing Provider  apixaban  (ELIQUIS ) 2.5 MG TABS tablet Take 1 tablet (2.5 mg total) by mouth 2 (two) times daily. 06/09/24   Jillian Buttery, MD  atorvastatin  (LIPITOR ) 80 MG tablet TAKE 1 TABLET BY MOUTH DAILY AT [facility] 05/19/19   Bensimhon,  Toribio SAUNDERS, MD  empagliflozin  (JARDIANCE ) 10 MG TABS tablet Take 1 tablet (10 mg total) by mouth daily. 01/09/24   Milford, Harlene HERO, FNP  ezetimibe  (ZETIA ) 10 MG tablet Take 1 tablet (10 mg total) by mouth daily. 01/09/23 06/17/24  Danford, Lonni SQUIBB, MD  ferrous sulfate 325 (65 FE) MG EC tablet Take 325 mg by mouth daily after breakfast.    [provider]  pregabalin  (LYRICA ) 25 MG capsule Take 25 mg  by mouth as needed (nerve pain).    [provider]  TYLENOL  500 MG tablet Take 500-1,000 mg by mouth every 6 (six) hours as needed for mild pain or headache.    [provider]  furosemide  (LASIX ) 40 MG tablet Take 1 tablet (40 mg total) by mouth as needed. 02/05/18 04/13/20  Bensimhon, Toribio SAUNDERS, MD    Physical Exam: Vitals:   06/22/24 1100 06/22/24 1200 06/22/24 1215 06/22/24 1242  BP: 102/64 138/81 107/70 110/69  Pulse: 90 90 83 83  Resp: 17 (!) 22 (!) 26 (!) 23  Temp:  (!) 97.3 F (36.3 C) (!) 97.1 F (36.2 C) (!) 97 F (36.1 C)  TempSrc:  Oral Oral Oral  SpO2: 96%  100%   Weight:      Height:        Constitutional: NAD, calm, comfortable.,pleasant elderly female Vitals:   06/22/24 1100 06/22/24 1200 06/22/24 1215 06/22/24 1242  BP: 102/64 138/81 107/70 110/69  Pulse: 90 90 83 83  Resp: 17 (!) 22 (!) 26 (!) 23  Temp:  (!) 97.3 F (36.3 C) (!) 97.1 F (36.2 C) (!) 97 F (36.1 C)  TempSrc:  Oral Oral Oral  SpO2: 96%  100%   Weight:      Height:       Eyes: PERRL, lids and conjunctivae normal ENMT: Mucous membranes are moist.  Neck: normal, supple, no masses, no thyromegaly Respiratory: clear to auscultation bilaterally, no wheezing, no crackles. Normal respiratory effort. No accessory muscle use.  Cardiovascular: Regular rate and rhythm, no murmurs / rubs / gallops. No extremity edema.  Abdomen: no tenderness, no masses palpated. No hepatosplenomegaly. Bowel sounds positive.  Musculoskeletal: no clubbing / cyanosis. No joint deformity upper and lower extremities.  Skin: no rashes, lesions, ulcers. No induration Neurologic: CN 2-12 grossly intact.  Strength 5/5 in all 4.  Psychiatric: Normal judgment and insight. Alert and oriented x 3. Normal mood.   Foley Catheter:None  Labs on Admission: I have personally reviewed following labs and imaging studies  CBC: Recent Labs  Lab 06/17/24 1813 06/22/24 0905  WBC 5.7 12.0*  NEUTROABS 3.3 9.8*  HGB  7.1* 4.0*  HCT 22.8* 13.2*  MCV 89.1 94.3  PLT 334 235   Basic Metabolic Panel: Recent Labs  Lab 06/17/24 1141 06/17/24 1813 06/22/24 0905  NA 138 137 136  K 5.8* 5.9* 4.5  CL 107 105 110  CO2 20* 21* 16*  GLUCOSE 96 105* 141*  BUN 38* 41* 58*  CREATININE 1.66* 1.74* 1.64*  CALCIUM  9.2 9.2 7.7*   GFR: Estimated Creatinine Clearance: 18 mL/min (A) (by C-G formula based on SCr of 1.64 mg/dL (H)). Liver Function Tests: Recent Labs  Lab 06/22/24 0905  AST 25  ALT 25  ALKPHOS 70  BILITOT 0.4  PROT 6.1*  ALBUMIN 2.9*   No results for input(s): LIPASE, AMYLASE in the last 168 hours. No results for input(s): AMMONIA in the last 168 hours. Coagulation Profile: Recent Labs  Lab 06/22/24 0905  INR  1.3*   Cardiac Enzymes: No results for input(s): CKTOTAL, CKMB, CKMBINDEX, TROPONINI in the last 168 hours. BNP (last 3 results) Recent Labs    06/05/24 2038 06/06/24 0837  PROBNP 8,968.0* 12,953.0*   HbA1C: No results for input(s): HGBA1C in the last 72 hours. CBG: No results for input(s): GLUCAP in the last 168 hours. Lipid Profile: No results for input(s): CHOL, HDL, LDLCALC, TRIG, CHOLHDL, LDLDIRECT in the last 72 hours. Thyroid Function Tests: No results for input(s): TSH, T4TOTAL, FREET4, T3FREE, THYROIDAB in the last 72 hours. Anemia Panel: No results for input(s): VITAMINB12, FOLATE, FERRITIN, TIBC, IRON , RETICCTPCT in the last 72 hours. Urine analysis:    Component Value Date/Time   COLORURINE STRAW (A) 01/07/2023 2113   APPEARANCEUR CLEAR 01/07/2023 2113   LABSPEC 1.006 01/07/2023 2113   PHURINE 6.0 01/07/2023 2113   GLUCOSEU NEGATIVE 01/07/2023 2113   HGBUR SMALL (A) 01/07/2023 2113   BILIRUBINUR NEGATIVE 01/07/2023 2113   KETONESUR NEGATIVE 01/07/2023 2113   PROTEINUR NEGATIVE 01/07/2023 2113   NITRITE NEGATIVE 01/07/2023 2113   LEUKOCYTESUR NEGATIVE 01/07/2023 2113    Radiological Exams on  Admission: DG Chest 1 View Result Date: 06/22/2024 CLINICAL DATA:  Weakness. EXAM: CHEST  1 VIEW COMPARISON:  [id number] FINDINGS: The heart size and mediastinal contours are within normal limits. There is no evidence of pulmonary edema, consolidation, pneumothorax or pleural fluid. The visualized skeletal structures are unremarkable. IMPRESSION: No active disease. Electronically Signed   By: Marcey Moan M.D.   On: 06/22/2024 09:55     Assessment/Plan Principal Problem:   Lower GI bleed Active Problems:   Chronic kidney disease, stage 3b (HCC)   CAD (coronary artery disease)   Hyperlipidemia   Aneurysm of left ventricle of heart   Chronic combined systolic and diastolic congestive heart failure (HCC)   Acute blood loss anemia     Acute lower GI bleed/acute blood loss anemia/hypovolemic shock: Presented with 6 episodes of hematochezia.  Bright blood red per rectum.  Takes Eliquis  at home which is on hold.  Denies any abdominal pain, nausea or vomiting, no rectal pain.  On presentation, her hemoglobin was in the range of 4.  Currently blood pressure stable.  Being given 2 units of blood transfusion.  GI consulted, plan for colonoscopy /EGD.  Continue to monitor H&H.  Her baseline hemoglobin ranges from 7-8  Chronic HFrEF/severe mitral regurgitation: Currently appears euvolemic.  Takes torsemide  at home.Will hold torsemide  for now due to hypotension. She follows with Dr. Bensimhon Previously she was on Coreg , Entresto , spironolactone , Jardiance , Imdur  at home. On her last hospitalization, she remained  hypotensive show most of her cardiac medications are on hold.  Echo done on last admission showed EF of 40-45 EF, grade 2 diastolic dysfunction, severe mitral valve regurgitation.  Cardiology recommended had recommended right heart cath but she did not  not want to do any kind of procedure. Palliative care was consulted and she scheduled outpatient follow-up.  She had an appointment  with  cardiology on 9/30.  MitraClip was discussed during last admission, she was not interested.  Jardiance  to be continued  History of coronary artery disease: No anginal symptoms.  On atorvastatin , Zetia .   CKD stage 3A: Baseline creatinine 1.3.  Creatinine in the range of 1.6   MGUS: Follows with hematology at [facility]   Left ventricular aneurysm: On Eliquis ,now on hold  Leukocytosis: Most likely reactive.  Continue to monitor      Severity of Illness: The appropriate patient status for this  patient is INPATIENT. Inpatient status is judged to be reasonable and necessary in order to provide the required intensity of service to ensure the patient's safety. The patient's presenting symptoms, physical exam findings, and initial radiographic and laboratory data in the context of their chronic comorbidities is felt to place them at high risk for further clinical deterioration. Furthermore, it is not anticipated that the patient will be medically stable for discharge from the hospital within 2 midnights of admission.   * I certify that at the point of admission it is my clinical judgment that the patient will require inpatient hospital care spanning beyond 2 midnights from the point of admission due to high intensity of service, high risk for further deterioration and high frequency of surveillance required.*   DVT prophylaxis: SCD Code Status: Full Family Communication: Called and discussed with daughter Christal on phone on 10/5 Consults called: GI     Ivonne Mustache MD [facility]  06/22/2024, 2:22 PM

## 2024-06-22 NOTE — Consult Note (Signed)
 Reason for Consult: Hematochezia, hypotension, and anemia Referring Physician: [facility]  Faith Gray HPI: This is an 83 year old female with a PMH of CHF (EF 40-45% 05/2024), CAD, history of an MI, severe MR, left ventricular aneurysm on Eliquis , and HTN admitted for complaints of hematochezia.  Her symptoms of painless hematochezia started acutely at 4 PM.  With the progressive episodes of bleeding she felt as if she was going to faint, but syncope never occurred.  The patient was transported by EMS after having 6 episodes of hematochezia.  In addition to the near syncope symptoms, she also reported fatigue and some SOB.  Blood pressure obtained by EMS was at 80/50 and she was successfully resuscitated with 500 mL of fluids.  Her BP increased to 118/60 mmHg.  Blood work in the ER showed that her HGB dropped down from 7.1 g/dL (0/69/7974) to 4 g/dL.  She is feeling better now.  The patient denies having a prior endoscopic work up.  There were no reports of NSAIDs or family history of colon cancer.  Past Medical History:  Diagnosis Date   Acute systolic heart failure (HCC) 09/09/2012   Anemia    Arthritis    CAD (coronary artery disease) 09/11/2012   Essential hypertension 10/04/2012   Ischemic cardiomyopathy    NSTEMI (non-ST elevated myocardial infarction) (HCC) 09/11/2012    Past Surgical History:  Procedure Laterality Date   CARDIAC CATHETERIZATION     LEFT AND RIGHT HEART CATHETERIZATION WITH CORONARY ANGIOGRAM N/A 09/10/2012   Procedure: LEFT AND RIGHT HEART CATHETERIZATION WITH CORONARY ANGIOGRAM;  Surgeon: Toribio JONELLE Fuel, MD;  Location: [facility] CATH LAB;  Service: Cardiovascular;  Laterality: N/A;   MULTIPLE EXTRACTIONS WITH ALVEOLOPLASTY N/A 02/16/2017   Procedure: MULTIPLE EXTRACTION WITH ALVEOLOPLASTY;  Surgeon: Sheryle Hamilton, DDS;  Location: [facility] OR;  Service: Oral Surgery;  Laterality: N/A;    Family History  Problem Relation Age of Onset   Sickle cell anemia Brother      Social History:  reports that she has quit smoking. Her smoking use included cigarettes. She has a 10 pack-year smoking history. She has never used smokeless tobacco. She reports that she does not drink alcohol and does not use drugs.  Allergies:  Allergies  Allergen Reactions   Vicodin [Hydrocodone-Acetaminophen ] Nausea And Vomiting    Medications: Scheduled:  atorvastatin   40 mg Oral Daily   empagliflozin   10 mg Oral Daily   Continuous:  Results for orders placed or performed during the hospital encounter of 06/22/24 (from the past 24 hours)  Comprehensive metabolic panel     Status: Abnormal   Collection Time: 06/22/24  9:05 AM  Result Value Ref Range   Sodium 136 135 - 145 mmol/L   Potassium 4.5 3.5 - 5.1 mmol/L   Chloride 110 98 - 111 mmol/L   CO2 16 (L) 22 - 32 mmol/L   Glucose, Bld 141 (H) 70 - 99 mg/dL   BUN 58 (H) 8 - 23 mg/dL   Creatinine, Ser 8.35 (H) 0.44 - 1.00 mg/dL   Calcium  7.7 (L) 8.9 - 10.3 mg/dL   Total Protein 6.1 (L) 6.5 - 8.1 g/dL   Albumin 2.9 (L) 3.5 - 5.0 g/dL   AST 25 15 - 41 U/L   ALT 25 0 - 44 U/L   Alkaline Phosphatase 70 38 - 126 U/L   Total Bilirubin 0.4 0.0 - 1.2 mg/dL   GFR, Estimated 31 (L) >60 mL/min   Anion gap 10 5 - 15  CBC  with Differential     Status: Abnormal   Collection Time: 06/22/24  9:05 AM  Result Value Ref Range   WBC 12.0 (H) 4.0 - 10.5 K/uL   RBC 1.40 (L) 3.87 - 5.11 MIL/uL   Hemoglobin 4.0 (LL) 12.0 - 15.0 g/dL   HCT 86.7 (L) 63.9 - 53.9 %   MCV 94.3 80.0 - 100.0 fL   MCH 28.6 26.0 - 34.0 pg   MCHC 30.3 30.0 - 36.0 g/dL   RDW 85.0 88.4 - 84.4 %   Platelets 235 150 - 400 K/uL   nRBC 0.3 (H) 0.0 - 0.2 %   Neutrophils Relative % 83 %   Neutro Abs 9.8 (H) 1.7 - 7.7 K/uL   Lymphocytes Relative 12 %   Lymphs Abs 1.5 0.7 - 4.0 K/uL   Monocytes Relative 4 %   Monocytes Absolute 0.5 0.1 - 1.0 K/uL   Eosinophils Relative 0 %   Eosinophils Absolute 0.0 0.0 - 0.5 K/uL   Basophils Relative 0 %   Basophils Absolute 0.0  0.0 - 0.1 K/uL   Immature Granulocytes 1 %   Abs Immature Granulocytes 0.08 (H) 0.00 - 0.07 K/uL  Protime-INR     Status: Abnormal   Collection Time: 06/22/24  9:05 AM  Result Value Ref Range   Prothrombin Time 16.7 (H) 11.4 - 15.2 seconds   INR 1.3 (H) 0.8 - 1.2  Type and screen [facility]     Status: None (Preliminary result)   Collection Time: 06/22/24  9:11 AM  Result Value Ref Range   ABO/RH(D) B NEG    Antibody Screen NEG    Sample Expiration 06/25/2024,2359    Unit Number [id number]    Blood Component Type RBC LR PHER1    Unit division 00    Status of Unit ISSUED    Transfusion Status OK TO TRANSFUSE    Crossmatch Result      Compatible Performed at [facility], [address], [place], [place] [address]    Unit Number [id number]    Blood Component Type RED CELLS,LR    Unit division 00    Status of Unit ALLOCATED    Transfusion Status OK TO TRANSFUSE    Crossmatch Result Compatible   Prepare RBC (crossmatch)     Status: None   Collection Time: 06/22/24 10:00 AM  Result Value Ref Range   Order Confirmation      ORDER PROCESSED BY BLOOD BANK Performed at [facility], [address] [place], [place] [address]      DG Chest 1 View Result Date: 06/22/2024 CLINICAL DATA:  Weakness. EXAM: CHEST  1 VIEW COMPARISON:  [id number] FINDINGS: The heart size and mediastinal contours are within normal limits. There is no evidence of pulmonary edema, consolidation, pneumothorax or pleural fluid. The visualized skeletal structures are unremarkable. IMPRESSION: No active disease. Electronically Signed   By: Marcey Moan M.D.   On: 06/22/2024 09:55    ROS:  As stated above in the HPI otherwise negative.  Blood pressure (!) 105/58, pulse 79, temperature (!) 97.2 F (36.2 C), temperature source Oral, resp. rate 17, height 4' 11 (1.499 m), weight 45.3 kg, SpO2 100%.    PE: Gen: NAD, Alert and Oriented HEENT:  [place]/AT, EOMI Neck: Supple, no  LAD Lungs: CTA Bilaterally CV: RRR without M/G/R ABD: Soft, NTND, +BS Ext: No C/C/E  Assessment/Plan: 1) Hematochezia. 2) Severe anemia. 3) CHF. 4) LV aneurysm on Eliquis .   The patient  is currently stable.  The bleeding is likely a diverticular source, but a rapid upper GI source of bleeding needs to be considered.  The patient does not have any pain on examination.  She still needs to be resuscitated and to allow for some more time to wash out the Eliquis .  Plan: 1) Continue with the blood transfusions. 2) Monitor HGB. 3) NPO after midnight in case an emergent EGD is required. 4) Colonoscopy this admission.  Probably Tuesday. 5) [facility] will assume care in the AM.  Stephanee Barcomb D 06/22/2024, 11:10 AM

## 2024-06-22 NOTE — ED Notes (Signed)
 Pt provided chicken broth, water, and ginger ale.

## 2024-06-22 NOTE — ED Notes (Signed)
 Ran Chem 8 ISTAT twice, HH will not result, informed RN

## 2024-06-22 NOTE — ED Triage Notes (Addendum)
 Pt BIB [facility] from home. Yesterday started having rectal bleeding. Pt on eliquis . Passing palm size clots per rectum. Bright red blood clots. Hx of anemia. Has Blood transfusion a few weeks ago. Endorses lethargy, dizziness, and generalized fatigue. 20g Rt hand. Zofran  administered by EMS.  80/50 on ems arrival.  500cc fluid EMS VS en route 118/60, CBG 192, 100 2L comfort, 96 RA, HR 70 w PVC.

## 2024-06-22 NOTE — ED Provider Notes (Signed)
 [place] EMERGENCY DEPARTMENT AT [facility] Provider Note   CSN: [id number] Arrival date & time: 06/22/24  9147     Patient presents with: Rectal Bleeding, Fatigue, and Dizziness   Faith Gray is a 83 y.o. female.   HPI   83 year old female chief complaint rectal bleeding presents today via [facility] with reports of rectal bleeding, patient on Eliquis .  Patient reports approximately 6 episodes of moderate amount of bright red blood.  She has a history of anemia thought to be secondary to end-stage renal disease.  She states she has not had any previous rectal bleeding and does not remember ever having a colonoscopy.  She did receive a blood transfusion a few weeks ago.  She has had symptoms that started yesterday.  She denies any pain, nausea, vomiting.  Has had some increased lightheadedness, dyspnea and, generalized fatigue.  EMS reported blood pressure 80/50 on arrival.  She received 500 cc of fluid IV.  Blood pressure increased to 118/60.  CBG was 192.  Patient was placed on 2 L of nasal cannula for comfort with oxygen  saturations at 96%.  Heart rate was noted to be 70% with report of PVCs on monitor  Prior to Admission medications   Medication Sig Start Date End Date Taking? Authorizing Provider  apixaban  (ELIQUIS ) 2.5 MG TABS tablet Take 1 tablet (2.5 mg total) by mouth 2 (two) times daily. 06/09/24   Jillian Buttery, MD  atorvastatin  (LIPITOR ) 80 MG tablet TAKE 1 TABLET BY MOUTH DAILY AT [facility] 05/19/19   Bensimhon, Toribio SAUNDERS, MD  empagliflozin  (JARDIANCE ) 10 MG TABS tablet Take 1 tablet (10 mg total) by mouth daily. 01/09/24   Milford, Harlene HERO, FNP  ezetimibe  (ZETIA ) 10 MG tablet Take 1 tablet (10 mg total) by mouth daily. 01/09/23 06/17/24  Danford, Lonni SQUIBB, MD  ferrous sulfate 325 (65 FE) MG EC tablet Take 325 mg by mouth daily after breakfast.    [provider]  pregabalin  (LYRICA ) 25 MG capsule Take 25 mg by mouth as needed (nerve pain).     [provider]  TYLENOL  500 MG tablet Take 500-1,000 mg by mouth every 6 (six) hours as needed for mild pain or headache.    [provider]  furosemide  (LASIX ) 40 MG tablet Take 1 tablet (40 mg total) by mouth as needed. 02/05/18 04/13/20  Bensimhon, Toribio SAUNDERS, MD    Allergies: Vicodin [hydrocodone-acetaminophen ]    Review of Systems  Updated Vital Signs BP (!) 105/58 (BP Location: Left Arm)   Pulse 79   Temp (!) 97.2 F (36.2 C) (Oral)   Resp 17   Ht 1.499 m (4' 11)   Wt 45.3 kg   SpO2 100%   BMI 20.18 kg/m   Physical Exam Constitutional:      General: She is not in acute distress.    Appearance: Normal appearance. She is ill-appearing.     Comments: Cachectic female  HENT:     Head: Normocephalic and atraumatic.     Right Ear: External ear normal.     Left Ear: External ear normal.     Nose: Nose normal.     Mouth/Throat:     Mouth: Mucous membranes are moist.     Pharynx: Oropharynx is clear.  Eyes:     Comments: Conjunctival are pale  Cardiovascular:     Rate and Rhythm: Normal rate.     Pulses: Normal pulses.  Pulmonary:     Effort: Pulmonary effort is normal.  Breath sounds: Normal breath sounds.  Abdominal:     General: Abdomen is flat. Bowel sounds are normal. There is no distension.     Palpations: Abdomen is soft.     Tenderness: There is no abdominal tenderness.  Genitourinary:    Comments: Rectal performed with maroon stool noted no masses noted no tenderness Musculoskeletal:        General: Normal range of motion.     Cervical back: Normal range of motion.  Skin:    General: Skin is dry.     Capillary Refill: Capillary refill takes less than 2 seconds.     Coloration: Skin is pale.  Neurological:     General: No focal deficit present.     Mental Status: She is alert.     Cranial Nerves: No cranial nerve deficit.     Motor: No weakness.  Psychiatric:        Mood and Affect: Mood normal.        Behavior: Behavior normal.      (all labs ordered are listed, but only abnormal results are displayed) Labs Reviewed  COMPREHENSIVE METABOLIC PANEL WITH GFR - Abnormal; Notable for the following components:      Result Value   CO2 16 (*)    Glucose, Bld 141 (*)    BUN 58 (*)    Creatinine, Ser 1.64 (*)    Calcium  7.7 (*)    Total Protein 6.1 (*)    Albumin 2.9 (*)    GFR, Estimated 31 (*)    All other components within normal limits  CBC WITH DIFFERENTIAL/PLATELET - Abnormal; Notable for the following components:   WBC 12.0 (*)    RBC 1.40 (*)    Hemoglobin 4.0 (*)    HCT 13.2 (*)    nRBC 0.3 (*)    Neutro Abs 9.8 (*)    Abs Immature Granulocytes 0.08 (*)    All other components within normal limits  PROTIME-INR - Abnormal; Notable for the following components:   Prothrombin Time 16.7 (*)    INR 1.3 (*)    All other components within normal limits  I-STAT CHEM 8, ED  TYPE AND SCREEN  PREPARE RBC (CROSSMATCH)    EKG: None  Radiology: DG Chest 1 View Result Date: 06/22/2024 CLINICAL DATA:  Weakness. EXAM: CHEST  1 VIEW COMPARISON:  [id number] FINDINGS: The heart size and mediastinal contours are within normal limits. There is no evidence of pulmonary edema, consolidation, pneumothorax or pleural fluid. The visualized skeletal structures are unremarkable. IMPRESSION: No active disease. Electronically Signed   By: Marcey Moan M.D.   On: 06/22/2024 09:55     .Critical Care  Performed by: Levander Houston, MD Authorized by: Levander Houston, MD   Critical care provider statement:    Critical care time (minutes):  75   Critical care end time:  06/22/2024 10:54 AM   Critical care time was exclusive of:  Separately billable procedures and treating other patients and teaching time   Critical care was necessary to treat or prevent imminent or life-threatening deterioration of the following conditions:  Circulatory failure   Critical care was time spent personally by me on the following activities:   Development of treatment plan with patient or surrogate, discussions with consultants, evaluation of patient's response to treatment, examination of patient, ordering and review of laboratory studies, ordering and review of radiographic studies, ordering and performing treatments and interventions, pulse oximetry, re-evaluation of patient's condition and review of old charts  Medications Ordered in the ED  acetaminophen  (TYLENOL ) tablet 500-1,000 mg (has no administration in time range)  atorvastatin  (LIPITOR ) tablet 40 mg (has no administration in time range)  empagliflozin  (JARDIANCE ) tablet 10 mg (has no administration in time range)    Clinical Course as of 06/22/24 1054  Sun Jun 22, 2024  9040 Chest x-Haifa Hatton reviewed interpreted no evidence of acute abnormality is noted [DR]  1000 INR reviewed is 1.3 [DR]  1000 Hemoglobin reviewed and is 4.0.  2 units of packed red blood cells are ordered [DR]    Clinical Course User Index [DR] Levander Houston, MD                                 Medical Decision Making Amount and/or Complexity of Data Reviewed Labs: ordered. Radiology: ordered.  Risk Prescription drug management. Decision regarding hospitalization.   Reviewed discharge summary from 06/09/2024.  Patient had presented with volume overload and dyspnea.  She was diuresed in the hospital.  Noted ischemic cardiomyopathy EF 45%, left ventricular aneurysm on Eliquis , CAD, severe hypertension.  Palliative care was consulted.  Patient diuresed and was discharged to home with outpatient cardiology follow-up Patient presents today with  painless bleeding of maroon stool here. DDX  Anticoagulant associated GI bleeding, diverticulosis, polyps/carcinoma, colitis, angiodysplasia mesenteric ischemia, anal fissures, hemorrhoids, and upper GI sources such as ulcer, gastritis Doubt colitis or ischemia in setting of painless otherwise asymptomatic rectal bleeding Doubt anal fissures or hemorrhoids  based on physical exam and no pain seen no hemorrhoids and bleeding is maroon  Patient with significant anemia here.  No active ongoing bleeding noted although maroon stool noted.  Patient with last dose of Eliquis  last night.  Currently do not think we need to reverse her Eliquis  but continue to hold. 2 units of blood have been ordered here. Patient's blood pressure is 105/58.  Heart rate is 79.  Patient is awake and alert. Family at bedside.  They Discussed the patient wants to rescind DNR.  Patient endorses this.  Discussed with Dr. Jillian who will see for hospitalist admission Care discussed with Dr. Sherrine will who will see for GI     Final diagnoses:  Gastrointestinal hemorrhage, unspecified gastrointestinal hemorrhage type  Chronic anticoagulation    ED Discharge Orders     None          Levander Houston, MD 06/22/24 1054

## 2024-06-23 DIAGNOSIS — K625 Hemorrhage of anus and rectum: Secondary | ICD-10-CM

## 2024-06-23 DIAGNOSIS — Z7901 Long term (current) use of anticoagulants: Secondary | ICD-10-CM | POA: Diagnosis not present

## 2024-06-23 DIAGNOSIS — K922 Gastrointestinal hemorrhage, unspecified: Secondary | ICD-10-CM | POA: Diagnosis not present

## 2024-06-23 LAB — COMPREHENSIVE METABOLIC PANEL WITH GFR
ALT: 29 U/L (ref 0–44)
AST: 25 U/L (ref 15–41)
Albumin: 2.8 g/dL — ABNORMAL LOW (ref 3.5–5.0)
Alkaline Phosphatase: 61 U/L (ref 38–126)
Anion gap: 8 (ref 5–15)
BUN: 37 mg/dL — ABNORMAL HIGH (ref 8–23)
CO2: 18 mmol/L — ABNORMAL LOW (ref 22–32)
Calcium: 8 mg/dL — ABNORMAL LOW (ref 8.9–10.3)
Chloride: 110 mmol/L (ref 98–111)
Creatinine, Ser: 1.52 mg/dL — ABNORMAL HIGH (ref 0.44–1.00)
GFR, Estimated: 34 mL/min — ABNORMAL LOW (ref >60)
Glucose, Bld: 87 mg/dL (ref 70–99)
Potassium: 4.8 mmol/L (ref 3.5–5.1)
Sodium: 136 mmol/L (ref 135–145)
Total Bilirubin: 0.6 mg/dL (ref 0.0–1.2)
Total Protein: 5.8 g/dL — ABNORMAL LOW (ref 6.5–8.1)

## 2024-06-23 LAB — CBC
HCT: 20.3 % — ABNORMAL LOW (ref 36.0–46.0)
Hemoglobin: 6.9 g/dL — CL (ref 12.0–15.0)
MCH: 29.9 pg (ref 26.0–34.0)
MCHC: 34 g/dL (ref 30.0–36.0)
MCV: 87.9 fL (ref 80.0–100.0)
Platelets: 193 K/uL (ref 150–400)
RBC: 2.31 MIL/uL — ABNORMAL LOW (ref 3.87–5.11)
RDW: 15 % (ref 11.5–15.5)
WBC: 6.9 K/uL (ref 4.0–10.5)
nRBC: 0.6 % — ABNORMAL HIGH (ref 0.0–0.2)

## 2024-06-23 LAB — PREPARE RBC (CROSSMATCH)

## 2024-06-23 LAB — HEMOGLOBIN AND HEMATOCRIT, BLOOD
HCT: 34.3 % — ABNORMAL LOW (ref 36.0–46.0)
Hemoglobin: 11.6 g/dL — ABNORMAL LOW (ref 12.0–15.0)

## 2024-06-23 MED ORDER — PANTOPRAZOLE SODIUM 40 MG IV SOLR
40.0000 mg | Freq: Two times a day (BID) | INTRAVENOUS | Status: DC
  Administered 2024-06-23 – 2024-06-26 (×7): 40 mg via INTRAVENOUS
  Filled 2024-06-23 (×7): qty 10

## 2024-06-23 MED ORDER — LORATADINE 10 MG PO TABS
10.0000 mg | ORAL_TABLET | Freq: Every day | ORAL | Status: DC | PRN
  Administered 2024-06-23: 10 mg via ORAL
  Filled 2024-06-23: qty 1

## 2024-06-23 MED ORDER — SODIUM CHLORIDE 0.9 % IV SOLN: INTRAVENOUS | Status: AC

## 2024-06-23 MED ORDER — SIMETHICONE 80 MG PO CHEW
240.0000 mg | CHEWABLE_TABLET | Freq: Once | ORAL | Status: AC
  Administered 2024-06-23: 240 mg via ORAL
  Filled 2024-06-23: qty 3

## 2024-06-23 MED ORDER — BISACODYL 5 MG PO TBEC
10.0000 mg | DELAYED_RELEASE_TABLET | Freq: Once | ORAL | Status: AC
  Administered 2024-06-23: 10 mg via ORAL
  Filled 2024-06-23: qty 2

## 2024-06-23 MED ORDER — POLYETHYLENE GLYCOL 3350 17 GM/SCOOP PO POWD
119.0000 g | Freq: Once | ORAL | Status: AC
  Administered 2024-06-23: 119 g via ORAL
  Filled 2024-06-23: qty 119

## 2024-06-23 MED ORDER — POLYETHYLENE GLYCOL 3350 17 GM/SCOOP PO POWD
119.0000 g | Freq: Once | ORAL | Status: AC
  Administered 2024-06-23: 119 g via ORAL
  Filled 2024-06-23 (×2): qty 119

## 2024-06-23 MED ORDER — SODIUM CHLORIDE 0.9% IV SOLUTION: Freq: Once | INTRAVENOUS | Status: AC

## 2024-06-23 NOTE — H&P (View-Only) (Signed)
 Patient ID: Faith Gray, female   DOB: 04/18/1941, 83 y.o.   MRN: [id number]    Progress Note   Subjective   Day # 2 CC; acute presumed lower GI bleeding in setting of Eliquis   Labs today-WBC 6.9/hemoglobin 6.9/hematocrit 20.3 entheses hemoglobin was 4 on arrival yesterday) Sodium 136/potassium 4.8/BUN 37/creatinine 1.57 LFTs within normal limits  Echo 9/25 2025 EF 40 to 45%, grade 2 diastolic dysfunction, severe mitral valve regurgitation.  Basal to mid inferior left ventricular wall is aneurysmal  Last dose of Eliquis  Saturday a.m. 06/23/2024  Patient says she feels better after blood transfusions weakness has improved, she has no GI symptoms and denies any recent GI symptoms prior to onset of bleeding.  Specifically no complaints of abdominal pain changes in bowel habits nausea vomiting indigestion dysphagia. No bowel movements since admission-says she had multiple bowel movements Saturday into Sunday all maroonish in color with a lot of blood No prior history of GI bleeding and no prior endoscopic evaluation   Objective   Vital signs in last 24 hours: Temp:  [97 F (36.1 C)-98.5 F (36.9 C)] 98.5 F (36.9 C) (10/06 0807) Pulse Rate:  [70-108] 78 (10/06 0807) Resp:  [14-26] 17 (10/06 0807) BP: (102-144)/(54-83) 123/83 (10/06 0807) SpO2:  [75 %-100 %] 96 % (10/06 0807) Last BM Date : 06/22/24 General: Thin   elderly AA female in NAD Heart:  Regular rate and rhythm; no murmurs Lungs: Respirations even and unlabored, lungs CTA bilaterally Abdomen:  Soft, nontender and nondistended. Normal bowel sounds. Extremities:  Without edema. Neurologic:  Alert and oriented,  grossly normal neurologically. Psych:  Cooperative. Normal mood and affect.  Intake/Output from previous day: 10/05 0701 - 10/06 0700 In: 360 [P.O.:360] Out: -  Intake/Output this shift: No intake/output data recorded.  Lab Results: Recent Labs    06/22/24 0905 06/22/24 1830 06/23/24 0746  WBC 12.0*  --   6.9  HGB 4.0* 7.0* 6.9*  HCT 13.2* 21.8* 20.3*  PLT 235  --  193   BMET Recent Labs    06/22/24 0905 06/23/24 0746  NA 136 136  K 4.5 4.8  CL 110 110  CO2 16* 18*  GLUCOSE 141* 87  BUN 58* 37*  CREATININE 1.64* 1.52*  CALCIUM  7.7* 8.0*   LFT Recent Labs    06/23/24 0746  PROT 5.8*  ALBUMIN 2.8*  AST 25  ALT 29  ALKPHOS 61  BILITOT 0.6   PT/INR Recent Labs    06/22/24 0905  LABPROT 16.7*  INR 1.3*    Studies/Results: DG Chest 1 View Result Date: 06/22/2024 CLINICAL DATA:  Weakness. EXAM: CHEST  1 VIEW COMPARISON:  [id number] FINDINGS: The heart size and mediastinal contours are within normal limits. There is no evidence of pulmonary edema, consolidation, pneumothorax or pleural fluid. The visualized skeletal structures are unremarkable. IMPRESSION: No active disease. Electronically Signed   By: Marcey Moan M.D.   On: 06/22/2024 09:55       Assessment / Plan:    #32 83 year old African-American female on chronic Eliquis  who presented to the ER yesterday after onset of rectal bleeding on Saturday, 06/21/2024 with multiple episodes occurring at home of dark red blood per rectum.  She did have an episode of nausea and vomiting but no hematemesis. Initially hypotensive by EMS received fluid bolus Initial hemoglobin 4.0/crit 13.2  Patient received 2 units of packed RBCs and hemoglobin up to 6.9 this morning-receiving a third unit currently.  No bowel movements or bleeding since admission over the  past 24 hours.   Patient has not had any prior GI evaluation. Suspect this is a diverticular hemorrhage that has resolved, however cannot rule out upper GI source in the setting of anticoagulation, she did have an elevated BUN on admission.  #2 ischemic cardiomyopathy with EF 45%-he send admission with congestive heart failure #3 left ventricular aneurysm-on Eliquis  #4 coronary artery disease  Plan; clear liquid diet today, n.p.o. past midnight Continue to trend  hemoglobin every 6 hours and transfuse to keep hemoglobin above 7 Hold Eliquis  Will plan for colonoscopy plus minus EGD with Dr. Leigh for tomorrow 06/24/2024, bowel prep this evening.  Procedures were discussed in detail with the patient including indication risks and benefits and she is agreeable to proceed. GI will continue to follow with you.   Principal Problem:   Lower GI bleed Active Problems:   CAD (coronary artery disease)   Aneurysm of left ventricle of heart   Chronic combined systolic and diastolic congestive heart failure (HCC)   Chronic kidney disease, stage 3b (HCC)   Hyperlipidemia   Acute blood loss anemia     LOS: 1 day   Amy EsterwoodPA-C  06/23/2024, 9:58 AM   ATTENDING ADDENDUM 83 year old female with a history of left ventricular aneurysm on Eliquis , CHF, EF 45%, CAD, presenting with likely lower GI bleed, and anemia.  Initial hemoglobin of 4 after multiple episodes of dark red blood per rectum on 10 4.  Her hemoglobin has risen with transfusion and she has had minimal output over the past day.  BUN was elevated on admission.  She has never had an EGD or colonoscopy.  We discussed differential diagnosis for her symptoms.  She has no pain with this.  Diverticular bleeding certainly high on DDx but given she has never had an endoscopic evaluation, recommend colonoscopy to further evaluate.  I offered her EGD at the same time to ensure no upper tract source given elevated BUN.  Following discussion of risks and benefits she wanted to proceed with this.  Eliquis  will be out of her system roughly 48 hours as of tomorrow morning.  Clear liquid diet today, prep tonight.  Plan for EGD colonoscopy tomorrow AM.  If she has significant bleeding overnight please contact us . Would consider CTA in setting of severe bleeding if GFR allows (GFR 20-30s), but seems higher risk for contrast nephropathy.  Call with questions. NPO after MN.   Marcey Leigh, MD [facility]  Gastroenterology

## 2024-06-23 NOTE — Progress Notes (Signed)
 PROGRESS NOTE  Faith Gray  FMW:[id number] DOB: 05/26/1941 DOA: 06/22/2024 PCP: Loris Elsie PARAS, PA-C   Brief Narrative: Faith Gray is a 83 y.o. female with medical history significant of ischemic cardiomyopathy with EF of 45%, left ventricular aneurysm on eliquis , coronary artery disease who presented with 6 episodes of hematochezia.  Hemoglobin of 4 on presentation.  Given 4 units of blood transfusion so far.  GI planning for colonoscopy tomorrow.  Hematochezia has stopped  Assessment & Plan:  Principal Problem:   Lower GI bleed Active Problems:   Chronic kidney disease, stage 3b (HCC)   CAD (coronary artery disease)   Hyperlipidemia   Aneurysm of left ventricle of heart   Chronic combined systolic and diastolic congestive heart failure (HCC)   Acute blood loss anemia   Acute lower GI bleed/acute blood loss anemia/hypovolemic shock: Presented with 6 episodes of hematochezia.  Bright blood red per rectum.  Takes Eliquis  at home which is on hold.  Denies any abdominal pain, nausea or vomiting, no rectal pain.  On presentation, her hemoglobin was in the range of 4.  Currently blood pressure stable.  Being given 4 units of blood transfusion so far.  GI consulted, plan for colonoscopy tomorrow.  Continue to monitor H&H.  Her baseline hemoglobin ranges from 7-8. Hematochezia stopped at this time   Chronic HFrEF/severe mitral regurgitation: Currently appears euvolemic.  Takes torsemide  at home.Will hold torsemide  for now due to hypotension. She follows with Dr. Bensimhon Previously she was on Coreg , Entresto , spironolactone , Jardiance , Imdur  at home. On her last hospitalization, she remained  hypotensive show most of her cardiac medications are on hold.  Echo done on last admission showed EF of 40-45 EF, grade 2 diastolic dysfunction, severe mitral valve regurgitation.  Cardiology recommended had recommended right heart cath but she did not  not want to do any kind of procedure. Palliative  care was consulted and she scheduled outpatient follow-up.  She had an appointment  with cardiology on 9/30.  MitraClip was discussed during last admission, she was not interested.  Jardiance  to be continued   History of coronary artery disease: No anginal symptoms.  On atorvastatin , Zetia .   CKD stage 3A: Baseline creatinine 1.3.  Creatinine in the range of 1.5   MGUS: Follows with hematology at [facility]   Left ventricular aneurysm: On Eliquis ,now on hold   Leukocytosis: Most likely reactive.  resolved        DVT prophylaxis:SCDs Start: 06/22/24 1044     Code Status: Full Code  Family Communication: called and discussed with daughter Christal on 10/5  Patient status:Inpatient  Patient is from :home  Anticipated discharge un:ynfz  Estimated DC date:after full GI work up   Consultants: GI  Procedures:None yet  Antimicrobials:  Anti-infectives (From admission, onward)    None       Subjective: Patient seen and examined at bedside today.  Remains very comfortable.  No more episodes of hematochezia since yesterday.  No abdomen pain, nausea or vomiting.  Very comfortable.  No new complaints.  Being frustrated at being clear liquid diet  Objective: Vitals:   06/23/24 0317 06/23/24 0807 06/23/24 1019 06/23/24 1045  BP: (!) 144/54 123/83 112/65 109/69  Pulse: 81 78 74 77  Resp: 20 17 18 19   Temp: 98.2 F (36.8 C) 98.5 F (36.9 C) 98 F (36.7 C) 98.4 F (36.9 C)  TempSrc: Oral Oral Oral Oral  SpO2: 98% 96% 96% 98%  Weight:      Height:  Intake/Output Summary (Last 24 hours) at 06/23/2024 1138 Last data filed at 06/22/2024 2015 Gross per 24 hour  Intake 360 ml  Output --  Net 360 ml   Filed Weights   06/22/24 0858  Weight: 45.3 kg    Examination:  General exam: Overall comfortable, not in distress,pleasant elderly female HEENT: PERRL Respiratory system:  no wheezes or crackles  Cardiovascular system: S1 & S2 heard, RRR.  Gastrointestinal  system: Abdomen is nondistended, soft and nontender. Central nervous system: Alert and oriented Extremities: No edema, no clubbing ,no cyanosis Skin: No rashes, no ulcers,no icterus     Data Reviewed: I have personally reviewed following labs and imaging studies  CBC: Recent Labs  Lab 06/17/24 1813 06/22/24 0905 06/22/24 1830 06/23/24 0746  WBC 5.7 12.0*  --  6.9  NEUTROABS 3.3 9.8*  --   --   HGB 7.1* 4.0* 7.0* 6.9*  HCT 22.8* 13.2* 21.8* 20.3*  MCV 89.1 94.3  --  87.9  PLT 334 235  --  193   Basic Metabolic Panel: Recent Labs  Lab 06/17/24 1141 06/17/24 1813 06/22/24 0905 06/23/24 0746  NA 138 137 136 136  K 5.8* 5.9* 4.5 4.8  CL 107 105 110 110  CO2 20* 21* 16* 18*  GLUCOSE 96 105* 141* 87  BUN 38* 41* 58* 37*  CREATININE 1.66* 1.74* 1.64* 1.52*  CALCIUM  9.2 9.2 7.7* 8.0*     No results found for this or any previous visit (from the past 240 hours).   Radiology Studies: DG Chest 1 View Result Date: 06/22/2024 CLINICAL DATA:  Weakness. EXAM: CHEST  1 VIEW COMPARISON:  [id number] FINDINGS: The heart size and mediastinal contours are within normal limits. There is no evidence of pulmonary edema, consolidation, pneumothorax or pleural fluid. The visualized skeletal structures are unremarkable. IMPRESSION: No active disease. Electronically Signed   By: Marcey Moan M.D.   On: 06/22/2024 09:55    Scheduled Meds:  sodium chloride    Intravenous Once   atorvastatin   40 mg Oral Daily   bisacodyl  10 mg Oral Once   empagliflozin   10 mg Oral Daily   ezetimibe   10 mg Oral Daily   ferrous sulfate  325 mg Oral QPC breakfast   pantoprazole (PROTONIX) IV  40 mg Intravenous Q12H   polyethylene glycol powder  119 g Oral Once   Followed by   polyethylene glycol powder  119 g Oral Once   simethicone  240 mg Oral Once   Followed by   simethicone  240 mg Oral Once   Continuous Infusions:  sodium chloride        LOS: 1 day   Ivonne Mustache, MD [facility] P10/02/2024, 11:38 AM

## 2024-06-23 NOTE — Plan of Care (Signed)

## 2024-06-23 NOTE — Progress Notes (Addendum)
 Patient ID: Faith Gray, female   DOB: 04/18/1941, 83 y.o.   MRN: [id number]    Progress Note   Subjective   Day # 2 CC; acute presumed lower GI bleeding in setting of Eliquis   Labs today-WBC 6.9/hemoglobin 6.9/hematocrit 20.3 entheses hemoglobin was 4 on arrival yesterday) Sodium 136/potassium 4.8/BUN 37/creatinine 1.57 LFTs within normal limits  Echo 9/25 2025 EF 40 to 45%, grade 2 diastolic dysfunction, severe mitral valve regurgitation.  Basal to mid inferior left ventricular wall is aneurysmal  Last dose of Eliquis  Saturday a.m. 06/23/2024  Patient says she feels better after blood transfusions weakness has improved, she has no GI symptoms and denies any recent GI symptoms prior to onset of bleeding.  Specifically no complaints of abdominal pain changes in bowel habits nausea vomiting indigestion dysphagia. No bowel movements since admission-says she had multiple bowel movements Saturday into Sunday all maroonish in color with a lot of blood No prior history of GI bleeding and no prior endoscopic evaluation   Objective   Vital signs in last 24 hours: Temp:  [97 F (36.1 C)-98.5 F (36.9 C)] 98.5 F (36.9 C) (10/06 0807) Pulse Rate:  [70-108] 78 (10/06 0807) Resp:  [14-26] 17 (10/06 0807) BP: (102-144)/(54-83) 123/83 (10/06 0807) SpO2:  [75 %-100 %] 96 % (10/06 0807) Last BM Date : 06/22/24 General: Thin   elderly AA female in NAD Heart:  Regular rate and rhythm; no murmurs Lungs: Respirations even and unlabored, lungs CTA bilaterally Abdomen:  Soft, nontender and nondistended. Normal bowel sounds. Extremities:  Without edema. Neurologic:  Alert and oriented,  grossly normal neurologically. Psych:  Cooperative. Normal mood and affect.  Intake/Output from previous day: 10/05 0701 - 10/06 0700 In: 360 [P.O.:360] Out: -  Intake/Output this shift: No intake/output data recorded.  Lab Results: Recent Labs    06/22/24 0905 06/22/24 1830 06/23/24 0746  WBC 12.0*  --   6.9  HGB 4.0* 7.0* 6.9*  HCT 13.2* 21.8* 20.3*  PLT 235  --  193   BMET Recent Labs    06/22/24 0905 06/23/24 0746  NA 136 136  K 4.5 4.8  CL 110 110  CO2 16* 18*  GLUCOSE 141* 87  BUN 58* 37*  CREATININE 1.64* 1.52*  CALCIUM  7.7* 8.0*   LFT Recent Labs    06/23/24 0746  PROT 5.8*  ALBUMIN 2.8*  AST 25  ALT 29  ALKPHOS 61  BILITOT 0.6   PT/INR Recent Labs    06/22/24 0905  LABPROT 16.7*  INR 1.3*    Studies/Results: DG Chest 1 View Result Date: 06/22/2024 CLINICAL DATA:  Weakness. EXAM: CHEST  1 VIEW COMPARISON:  [id number] FINDINGS: The heart size and mediastinal contours are within normal limits. There is no evidence of pulmonary edema, consolidation, pneumothorax or pleural fluid. The visualized skeletal structures are unremarkable. IMPRESSION: No active disease. Electronically Signed   By: Marcey Moan M.D.   On: 06/22/2024 09:55       Assessment / Plan:    #32 83 year old African-American female on chronic Eliquis  who presented to the ER yesterday after onset of rectal bleeding on Saturday, 06/21/2024 with multiple episodes occurring at home of dark red blood per rectum.  She did have an episode of nausea and vomiting but no hematemesis. Initially hypotensive by EMS received fluid bolus Initial hemoglobin 4.0/crit 13.2  Patient received 2 units of packed RBCs and hemoglobin up to 6.9 this morning-receiving a third unit currently.  No bowel movements or bleeding since admission over the  past 24 hours.   Patient has not had any prior GI evaluation. Suspect this is a diverticular hemorrhage that has resolved, however cannot rule out upper GI source in the setting of anticoagulation, she did have an elevated BUN on admission.  #2 ischemic cardiomyopathy with EF 45%-he send admission with congestive heart failure #3 left ventricular aneurysm-on Eliquis  #4 coronary artery disease  Plan; clear liquid diet today, n.p.o. past midnight Continue to trend  hemoglobin every 6 hours and transfuse to keep hemoglobin above 7 Hold Eliquis  Will plan for colonoscopy plus minus EGD with Dr. Leigh for tomorrow 06/24/2024, bowel prep this evening.  Procedures were discussed in detail with the patient including indication risks and benefits and she is agreeable to proceed. GI will continue to follow with you.   Principal Problem:   Lower GI bleed Active Problems:   CAD (coronary artery disease)   Aneurysm of left ventricle of heart   Chronic combined systolic and diastolic congestive heart failure (HCC)   Chronic kidney disease, stage 3b (HCC)   Hyperlipidemia   Acute blood loss anemia     LOS: 1 day   Amy EsterwoodPA-C  06/23/2024, 9:58 AM   ATTENDING ADDENDUM 83 year old female with a history of left ventricular aneurysm on Eliquis , CHF, EF 45%, CAD, presenting with likely lower GI bleed, and anemia.  Initial hemoglobin of 4 after multiple episodes of dark red blood per rectum on 10 4.  Her hemoglobin has risen with transfusion and she has had minimal output over the past day.  BUN was elevated on admission.  She has never had an EGD or colonoscopy.  We discussed differential diagnosis for her symptoms.  She has no pain with this.  Diverticular bleeding certainly high on DDx but given she has never had an endoscopic evaluation, recommend colonoscopy to further evaluate.  I offered her EGD at the same time to ensure no upper tract source given elevated BUN.  Following discussion of risks and benefits she wanted to proceed with this.  Eliquis  will be out of her system roughly 48 hours as of tomorrow morning.  Clear liquid diet today, prep tonight.  Plan for EGD colonoscopy tomorrow AM.  If she has significant bleeding overnight please contact us . Would consider CTA in setting of severe bleeding if GFR allows (GFR 20-30s), but seems higher risk for contrast nephropathy.  Call with questions. NPO after MN.   Marcey Leigh, MD [facility]  Gastroenterology

## 2024-06-24 ENCOUNTER — Inpatient Hospital Stay (HOSPITAL_COMMUNITY): Payer: Self-pay

## 2024-06-24 ENCOUNTER — Encounter (HOSPITAL_COMMUNITY): Payer: Self-pay | Admitting: Gastroenterology

## 2024-06-24 ENCOUNTER — Other Ambulatory Visit (HOSPITAL_COMMUNITY)

## 2024-06-24 ENCOUNTER — Encounter (HOSPITAL_COMMUNITY)
Admission: EM | Disposition: A | Discharge status: Home / Self Care | Payer: Self-pay | Source: Home / Self Care | Attending: Internal Medicine

## 2024-06-24 DIAGNOSIS — D62 Acute posthemorrhagic anemia: Secondary | ICD-10-CM | POA: Diagnosis not present

## 2024-06-24 DIAGNOSIS — K648 Other hemorrhoids: Secondary | ICD-10-CM

## 2024-06-24 DIAGNOSIS — K31819 Angiodysplasia of stomach and duodenum without bleeding: Secondary | ICD-10-CM | POA: Diagnosis not present

## 2024-06-24 DIAGNOSIS — K5730 Diverticulosis of large intestine without perforation or abscess without bleeding: Secondary | ICD-10-CM | POA: Diagnosis not present

## 2024-06-24 DIAGNOSIS — K922 Gastrointestinal hemorrhage, unspecified: Secondary | ICD-10-CM | POA: Diagnosis not present

## 2024-06-24 DIAGNOSIS — K5521 Angiodysplasia of colon with hemorrhage: Secondary | ICD-10-CM | POA: Diagnosis not present

## 2024-06-24 DIAGNOSIS — K449 Diaphragmatic hernia without obstruction or gangrene: Secondary | ICD-10-CM

## 2024-06-24 DIAGNOSIS — I110 Hypertensive heart disease with heart failure: Secondary | ICD-10-CM

## 2024-06-24 DIAGNOSIS — I5043 Acute on chronic combined systolic (congestive) and diastolic (congestive) heart failure: Secondary | ICD-10-CM

## 2024-06-24 DIAGNOSIS — K635 Polyp of colon: Secondary | ICD-10-CM | POA: Diagnosis not present

## 2024-06-24 DIAGNOSIS — K5520 Angiodysplasia of colon without hemorrhage: Secondary | ICD-10-CM

## 2024-06-24 DIAGNOSIS — D123 Benign neoplasm of transverse colon: Secondary | ICD-10-CM | POA: Diagnosis not present

## 2024-06-24 HISTORY — PX: ESOPHAGOGASTRODUODENOSCOPY: SHX5428

## 2024-06-24 HISTORY — PX: COLONOSCOPY: SHX5424

## 2024-06-24 LAB — BASIC METABOLIC PANEL WITH GFR
Anion gap: 8 (ref 5–15)
BUN: 25 mg/dL — ABNORMAL HIGH (ref 8–23)
CO2: 19 mmol/L — ABNORMAL LOW (ref 22–32)
Calcium: 9 mg/dL (ref 8.9–10.3)
Chloride: 108 mmol/L (ref 98–111)
Creatinine, Ser: 1.47 mg/dL — ABNORMAL HIGH (ref 0.44–1.00)
GFR, Estimated: 35 mL/min — ABNORMAL LOW (ref >60)
Glucose, Bld: 89 mg/dL (ref 70–99)
Potassium: 4.6 mmol/L (ref 3.5–5.1)
Sodium: 135 mmol/L (ref 135–145)

## 2024-06-24 LAB — HEMOGLOBIN AND HEMATOCRIT, BLOOD
HCT: 33 % — ABNORMAL LOW (ref 36.0–46.0)
HCT: 30.5 % — ABNORMAL LOW (ref 36.0–46.0)
Hemoglobin: 11.1 g/dL — ABNORMAL LOW (ref 12.0–15.0)
Hemoglobin: 10.2 g/dL — ABNORMAL LOW (ref 12.0–15.0)

## 2024-06-24 SURGERY — COLONOSCOPY
Anesthesia: Monitor Anesthesia Care

## 2024-06-24 MED ORDER — LIDOCAINE 2% (20 MG/ML) 5 ML SYRINGE
INTRAMUSCULAR | Status: DC | PRN
  Administered 2024-06-24: 30 mg via INTRAVENOUS

## 2024-06-24 MED ORDER — PROPOFOL 500 MG/50ML IV EMUL
INTRAVENOUS | Status: DC | PRN
  Administered 2024-06-24: 120 ug/kg/min via INTRAVENOUS

## 2024-06-24 MED ORDER — PHENOL 1.4 % MT LIQD
1.0000 | OROMUCOSAL | Status: DC | PRN
  Filled 2024-06-24: qty 177

## 2024-06-24 MED ORDER — PROPOFOL 10 MG/ML IV BOLUS
INTRAVENOUS | Status: DC | PRN
  Administered 2024-06-24: 20 mg via INTRAVENOUS
  Administered 2024-06-24: 90 mg via INTRAVENOUS
  Administered 2024-06-24 (×2): 40 mg via INTRAVENOUS
  Administered 2024-06-24: 10 mg via INTRAVENOUS

## 2024-06-24 NOTE — Interval H&P Note (Signed)
 History and Physical Interval Note: Patient here for EGD and colonoscopy today. She states bleeding has slowed / stopped with prep. She has responded to PRBC transfusion, otherwise feeling well. I have discussed risks / benefits of EGD and colonoscopy with her, she understands and wishes to proceed, further recommendations pending results. Last dose Eliquis  has been over 48 hours. All questions answered.   06/24/2024 9:25 AM  Faith Gray  has presented today for surgery, with the diagnosis of acute GI bleed, severe anemia.  The various methods of treatment have been discussed with the patient and family. After consideration of risks, benefits and other options for treatment, the patient has consented to  Procedure(s): COLONOSCOPY (N/A) EGD (ESOPHAGOGASTRODUODENOSCOPY) (N/A) as a surgical intervention.  The patient's history has been reviewed, patient examined, no change in status, stable for surgery.  I have reviewed the patient's chart and labs.  Questions were answered to the patient's satisfaction.     Elspeth P Ellsworth Waldschmidt

## 2024-06-24 NOTE — Anesthesia Postprocedure Evaluation (Signed)
 Anesthesia Post Note  Patient: Faith Gray  Procedure(s) Performed: COLONOSCOPY EGD (ESOPHAGOGASTRODUODENOSCOPY)     Patient location during evaluation: PACU Anesthesia Type: MAC Level of consciousness: awake Pain management: pain level controlled Vital Signs Assessment: post-procedure vital signs reviewed and stable Respiratory status: spontaneous breathing, nonlabored ventilation and respiratory function stable Cardiovascular status: stable and blood pressure returned to baseline Postop Assessment: no apparent nausea or vomiting Anesthetic complications: no   No notable events documented.  Last Vitals:  Vitals:   06/24/24 1220 06/24/24 1248  BP: (!) 157/82 (!) 139/98  Pulse: 82   Resp: (!) 22 17  Temp:    SpO2: 94% 100%    Last Pain:  Vitals:   06/24/24 1200  TempSrc:   PainSc: 0-No pain                 Delon Aisha Arch

## 2024-06-24 NOTE — Plan of Care (Signed)
  Problem: Education: Goal: Knowledge of General Education information will improve Description: Including pain rating scale, medication(s)/side effects and non-pharmacologic comfort measures Outcome: Progressing   Problem: Health Behavior/Discharge Planning: Goal: Ability to manage health-related needs will improve Outcome: Progressing   Problem: Clinical Measurements: Goal: Respiratory complications will improve Outcome: Progressing Goal: Cardiovascular complication will be avoided Outcome: Progressing   Problem: Activity: Goal: Risk for activity intolerance will decrease Outcome: Progressing   Problem: Coping: Goal: Level of anxiety will decrease Outcome: Progressing   Problem: Elimination: Goal: Will not experience complications related to urinary retention Outcome: Progressing   Problem: Pain Managment: Goal: General experience of comfort will improve and/or be controlled Outcome: Progressing   Problem: Safety: Goal: Ability to remain free from injury will improve Outcome: Progressing   Problem: Skin Integrity: Goal: Risk for impaired skin integrity will decrease Outcome: Progressing   Problem: Clinical Measurements: Goal: Ability to maintain clinical measurements within normal limits will improve Outcome: Not Progressing   Problem: Nutrition: Goal: Adequate nutrition will be maintained Outcome: Not Progressing   Problem: Elimination: Goal: Will not experience complications related to bowel motility Outcome: Not Progressing

## 2024-06-24 NOTE — Op Note (Signed)
 [facility] Patient Name: Faith Gray Procedure Date : 06/24/2024 MRN: [id number] Attending MD: Elspeth SQUIBB. Leigh , MD, [id number] Date of Birth: 02-01-1941 CSN: [id number] Age: 83 Admit Type: Inpatient Procedure:                Colonoscopy Indications:              Gastrointestinal bleeding - negative EGD for cause Providers:                Elspeth P. Leigh, MD, Ozell Pouch, Corene Southgate, Technician Referring MD:              Medicines:                Monitored Anesthesia Care Complications:            No immediate complications. Estimated blood loss:                            Minimal. Estimated Blood Loss:     Estimated blood loss was minimal. Procedure:                Pre-Anesthesia Assessment:                           - Prior to the procedure, a History and Physical                            was performed, and patient medications and                            allergies were reviewed. The patient's tolerance of                            previous anesthesia was also reviewed. The risks                            and benefits of the procedure and the sedation                            options and risks were discussed with the patient.                            All questions were answered, and informed consent                            was obtained. Prior Anticoagulants: The patient has                            taken Eliquis  (apixaban ), last dose was 2 days                            prior to procedure. ASA Grade Assessment: IV - A  patient with severe systemic disease that is a                            constant threat to life. After reviewing the risks                            and benefits, the patient was deemed in                            satisfactory condition to undergo the procedure.                           After obtaining informed consent, the colonoscope                             was passed under direct vision. Throughout the                            procedure, the patient's blood pressure, pulse, and                            oxygen  saturations were monitored continuously. The                            [id number]-H190TL ([id number] Olympus colonoscope was                            introduced through the anus and advanced to the the                            cecum, identified by appendiceal orifice and                            ileocecal valve. The colonoscopy was technically                            difficult and complex due to inadequate bowel prep.                            The patient tolerated the procedure well. The                            quality of the bowel preparation was inadequate.                            The ileocecal valve, appendiceal orifice, and                            rectum were photographed. Scope In: 10:58:41 AM Scope Out: 11:43:31 AM Scope Withdrawal Time: 0 hours 28 minutes 51 seconds  Total Procedure Duration: 0 hours 44 minutes 50 seconds  Findings:      The perianal and digital rectal examinations were normal.      A large amount of semi-liquid stool and maroon colored  blood was found       in the entire colon, making visualization difficult. Cecal intubation       was initially achieved (and challenging due to tortousity), but due to       stool / blood the lense clouded and could not clear it off. The scope       had to be withdrawn, lense manually cleaned, and then cecal intubation       performed again, which prolonged the exam.      A single angiodysplastic lesion was found at the ileocecal valve. I had       initially lavaged this during cecal intubation. It was not actively       bleeding initially but following lavage had persistent oozing. Unclear       if this was related to her symptoms or not. Fulguration to stop the       bleeding by argon plasma and ablate the lesion was successful.      Multiple sessile polyps  were found in the transverse colon. These were       not removed today given prep and recent bleeding.      A large benign appearing polyp was found in the sigmoid colon, suspect       hyperplastic. The polyp was pedunculated. Biopsies were taken with a       cold forceps for histology.      Many diverticula were found in the transverse colon and left colon.       These were extensively lavaged - no active bleeding.      Internal hemorrhoids were found during retroflexion.      The exam was otherwise without abnormality. Impression:               - Preparation of the colon was inadequate -                            significant stool and blood in the colon which was                            lavaged..                           - Stool in the entire examined colon.                           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                            Treated with argon plasma coagulation (APC).                           - Multiple polyps in the transverse colon - not                            removed.                           - One large polyp in the sigmoid colon - appears to  be a benign hyperplastic polyp. Biopsied.                           - Diverticulosis in the transverse colon and in the                            left colon.                           - Internal hemorrhoids.                           - The examination was otherwise normal.                           Suspect she either has had a diverticular bleed or                            bleeding from the AVM at the IC valve. Extensive                            lavage done, no active bleeding at the end of the                            procedure. Recommendation:           - Return patient to hospital ward for ongoing care.                           - Clear liquid diet.                           - Continue present medications.                           - Trend Hgb and monitor for  rebleeding                           - If rebleeding occurs consider CTA / IR consult -                            she has CKD however, at risk for contrast                            nephropathy                           - GI service will follow Procedure Code(s):        --- Professional ---                           [id number], 59, Colonoscopy, flexible; with control of                            bleeding, any method  54619, Colonoscopy, flexible; with biopsy, single                            or multiple Diagnosis Code(s):        --- Professional ---                           K64.8, Other hemorrhoids                           K55.21, Angiodysplasia of colon with hemorrhage                           D12.3, Benign neoplasm of transverse colon (hepatic                            flexure or splenic flexure)                           D12.5, Benign neoplasm of sigmoid colon                           K92.2, Gastrointestinal hemorrhage, unspecified                           K57.30, Diverticulosis of large intestine without                            perforation or abscess without bleeding CPT copyright 2022 [employer]. All rights reserved. The codes documented in this report are preliminary and upon coder review may  be revised to meet current compliance requirements. Elspeth P. Iwao Shamblin, MD 06/24/2024 11:59:42 AM This report has been signed electronically. Number of Addenda: 0

## 2024-06-24 NOTE — Op Note (Signed)
 [facility] Patient Name: Faith Gray Procedure Date : 06/24/2024 MRN: [id number] Attending MD: Elspeth SQUIBB. Leigh , MD, [id number] Date of Birth: [birth year] CSN: [id number] Age: 83 Admit Type: Inpatient Procedure:                Upper GI endoscopy Indications:              Gastrointestinal bleeding of unknown origin - rule                            out upper tract source (elevated BUN) Providers:                Elspeth SQUIBB. Leigh, MD, Ozell Pouch, Corene Southgate, Technician Referring MD:              Medicines:                Monitored Anesthesia Care Complications:            No immediate complications. Estimated blood loss:                            None. Estimated Blood Loss:     Estimated blood loss: none. Procedure:                Pre-Anesthesia Assessment:                           - Prior to the procedure, a History and Physical                            was performed, and patient medications and                            allergies were reviewed. The patient's tolerance of                            previous anesthesia was also reviewed. The risks                            and benefits of the procedure and the sedation                            options and risks were discussed with the patient.                            All questions were answered, and informed consent                            was obtained. Prior Anticoagulants: The patient has                            taken Eliquis  (apixaban ), last dose was 2 days  prior to procedure. ASA Grade Assessment: IV - A                            patient with severe systemic disease that is a                            constant threat to life. After reviewing the risks                            and benefits, the patient was deemed in                            satisfactory condition to undergo the procedure.                           After  obtaining informed consent, the endoscope was                            passed under direct vision. Throughout the                            procedure, the patient's blood pressure, pulse, and                            oxygen  saturations were monitored continuously. The                            [id number]-[id number] ([id number]) Olympus endoscope was introduced                            through the mouth, and advanced to the second part                            of duodenum. The upper GI endoscopy was                            accomplished without difficulty. The patient                            tolerated the procedure well. Scope In: Scope Out: Findings:      Esophagogastric landmarks were identified: the Z-line was found at 37       cm, the gastroesophageal junction was found at 37 cm and the upper       extent of the gastric folds was found at 40 cm from the incisors.      A 3 cm hiatal hernia was present.      The exam of the esophagus was otherwise normal.      A few diminutive angiodysplastic lesions with no bleeding were found in       the prepyloric region of the stomach.      The exam of the stomach was otherwise normal.      The examined duodenum was normal. Impression:               - Esophagogastric landmarks identified.                           -  3 cm hiatal hernia.                           - A few non-bleeding angiodysplastic lesions in the                            stomach.                           - Normal stomach otherwise.                           - Normal examined duodenum.                           No cause for active bleeding on EGD Recommendation:           - Proceed with colonoscopy - full recommendations                            in that report Procedure Code(s):        --- Professional ---                           [phone], Esophagogastroduodenoscopy, flexible,                            transoral; diagnostic, including collection of                             specimen(s) by brushing or washing, when performed                            (separate procedure) Diagnosis Code(s):        --- Professional ---                           K44.9, Diaphragmatic hernia without obstruction or                            gangrene                           K31.819, Angiodysplasia of stomach and duodenum                            without bleeding                           K92.2, Gastrointestinal hemorrhage, unspecified CPT copyright 2022 [employer] Association. All rights reserved. The codes documented in this report are preliminary and upon coder review may  be revised to meet current compliance requirements. Elspeth P. Katrell Milhorn, MD 06/24/2024 11:47:43 AM This report has been signed electronically. Number of Addenda: 0

## 2024-06-24 NOTE — Progress Notes (Signed)
 PROGRESS NOTE  Faith Gray  FMW:[id number] DOB: 1941-05-04 DOA: 06/22/2024 PCP: Loris Elsie PARAS, PA-C   Brief Narrative: Faith Gray is a 83 y.o. female with medical history significant of ischemic cardiomyopathy with EF of 45%, left ventricular aneurysm on eliquis , coronary artery disease who presented with 6 episodes of hematochezia.  Hemoglobin of 4 on presentation.  Given 4 units of blood transfusion so far.  Hematochezia has stopped, hemoglobin stable.  Done with EGD/colonoscopy without finding of any acute bleeding.  Suspected to have AVMs or diverticular bleed.  Possible discharge tomorrow if hemoglobin is stable  Assessment & Plan:  Principal Problem:   Lower GI bleed Active Problems:   Chronic kidney disease, stage 3b (HCC)   CAD (coronary artery disease)   Hyperlipidemia   Aneurysm of left ventricle of heart   Anticoagulated   Chronic combined systolic and diastolic congestive heart failure (HCC)   Acute blood loss anemia   AVM (arteriovenous malformation) of colon   Acute lower GI bleed/acute blood loss anemia/hypovolemic shock: Presented with 6 episodes of hematochezia.  Bright blood red per rectum.  Takes Eliquis  at home which is on hold.  Denies any abdominal pain, nausea or vomiting, no rectal pain.  On presentation, her hemoglobin was in the range of 4.  Currently blood pressure stable.  Being given 4 units of blood transfusion so far.  GI consulted.Hematochezia stopped at this time.  Hemoglobin in the range of 11 today. EGD showed normal stomach, duodenum.  Colonoscopy showed significant stool and blood in the colon which was lavaged.  AVM was noted at the IC valve, treated with argon plasma coagulation.Also found to have multiple polyp in the transverse colon, diverticulosis.  Diverticular bleed versus bleeding from AVM at the IC valve suspected.She is currently on clear liquid diet.   Chronic HFrEF/severe mitral regurgitation: Currently appears euvolemic.  Takes  torsemide  at home.Will hold torsemide  for now due to hypotension. She follows with Dr. Bensimhon Previously she was on Coreg , Entresto , spironolactone , Jardiance , Imdur  at home. On her last hospitalization, she remained  hypotensive show most of her cardiac medications are on hold.  Echo done on last admission showed EF of 40-45 EF, grade 2 diastolic dysfunction, severe mitral valve regurgitation.  Cardiology recommended had recommended right heart cath but she did not  not want to do any kind of procedure. Palliative care was consulted and she scheduled outpatient follow-up.  She had an appointment  with cardiology on 9/30.  MitraClip was discussed during last admission, she was not interested.  Jardiance  to be continued   History of coronary artery disease: No anginal symptoms.  On atorvastatin , Zetia .   CKD stage 3A: Baseline creatinine 1.3.  Creatinine in the range of 1.4   MGUS: Follows with hematology at [facility]   Left ventricular aneurysm: On Eliquis ,now on hold.  Patient and her daughter are clear that they do not want to continue Eliquis  from now onward   Leukocytosis: Most likely reactive.  resolved        DVT prophylaxis:SCDs Start: 06/22/24 1044     Code Status: Full Code  Family Communication: called and discussed with daughter Christal on 10/5  Patient status:Inpatient  Patient is from :home  Anticipated discharge un:ynfz  Estimated DC date:tomorrow   Consultants: GI  Procedures:EGD/colonoscopy  Antimicrobials:  Anti-infectives (From admission, onward)    None       Subjective: Patient seen and examined at the bedside today.  She was able to go for EGD/colonoscopy.  No new  episodes of hematochezia or melena.  No abdominal pain, nausea or vomiting.  Comfortable this morning with a stable blood pressure  Objective: Vitals:   06/24/24 1200 06/24/24 1210 06/24/24 1220 06/24/24 1248  BP: 132/86 (!) 139/92 (!) 157/82 (!) 139/98  Pulse: 86 83 82   Resp:  (!) 27 18 (!) 22 17  Temp:      TempSrc:      SpO2: 95% 96% 94% 100%  Weight:      Height:        Intake/Output Summary (Last 24 hours) at 06/24/2024 1331 Last data filed at 06/24/2024 1158 Gross per 24 hour  Intake 1792.67 ml  Output 10 ml  Net 1782.67 ml   Filed Weights   06/22/24 0858  Weight: 45.3 kg    Examination:  General exam: Overall comfortable, not in distress, pleasant elderly female HEENT: PERRL Respiratory system:  no wheezes or crackles  Cardiovascular system: S1 & S2 heard, RRR.  Gastrointestinal system: Abdomen is nondistended, soft and nontender. Central nervous system: Alert and oriented Extremities: No edema, no clubbing ,no cyanosis Skin: No rashes, no ulcers,no icterus     Data Reviewed: I have personally reviewed following labs and imaging studies  CBC: Recent Labs  Lab 06/17/24 1813 06/22/24 0905 06/22/24 1830 06/23/24 0746 06/23/24 1850 06/24/24 0535  WBC 5.7 12.0*  --  6.9  --   --   NEUTROABS 3.3 9.8*  --   --   --   --   HGB 7.1* 4.0* 7.0* 6.9* 11.6* 11.1*  HCT 22.8* 13.2* 21.8* 20.3* 34.3* 33.0*  MCV 89.1 94.3  --  87.9  --   --   PLT 334 235  --  193  --   --    Basic Metabolic Panel: Recent Labs  Lab 06/17/24 1813 06/22/24 0905 06/23/24 0746 06/24/24 0535  NA 137 136 136 135  K 5.9* 4.5 4.8 4.6  CL 105 110 110 108  CO2 21* 16* 18* 19*  GLUCOSE 105* 141* 87 89  BUN 41* 58* 37* 25*  CREATININE 1.74* 1.64* 1.52* 1.47*  CALCIUM  9.2 7.7* 8.0* 9.0     No results found for this or any previous visit (from the past 240 hours).   Radiology Studies: No results found.   Scheduled Meds:  sodium chloride    Intravenous Once   atorvastatin   40 mg Oral Daily   empagliflozin   10 mg Oral Daily   ezetimibe   10 mg Oral Daily   ferrous sulfate  325 mg Oral QPC breakfast   pantoprazole (PROTONIX) IV  40 mg Intravenous Q12H   Continuous Infusions:     LOS: 2 days   Ivonne Mustache, MD [facility] P10/03/2024, 1:31 PM

## 2024-06-24 NOTE — Transfer of Care (Signed)
 Immediate Anesthesia Transfer of Care Note  Patient: Faith Gray  Procedure(s) Performed: COLONOSCOPY EGD (ESOPHAGOGASTRODUODENOSCOPY)  Patient Location: PACU  Anesthesia Type:MAC  Level of Consciousness: drowsy  Airway & Oxygen  Therapy: Patient Spontanous Breathing and Patient connected to face mask oxygen   Post-op Assessment: Report given to RN and Post -op Vital signs reviewed and stable  Post vital signs: Reviewed and stable  Last Vitals:  Vitals Value Taken Time  BP 131/87 06/24/24 11:55  Temp 36.1 C 06/24/24 11:54  Pulse 85 06/24/24 11:57  Resp 24 06/24/24 11:57  SpO2 100 % 06/24/24 11:57  Vitals shown include unfiled device data.  Last Pain:  Vitals:   06/24/24 1154  TempSrc: Temporal  PainSc: Asleep         Complications: No notable events documented.

## 2024-06-24 NOTE — Anesthesia Preprocedure Evaluation (Addendum)
 Anesthesia Evaluation  Patient identified by MRN, date of birth, ID band Patient awake    Reviewed: Allergy & Precautions, NPO status , Patient's Chart, lab work & pertinent test results  History of Anesthesia Complications Negative for: history of anesthetic complications  Airway Mallampati: II  TM Distance: >3 FB Neck ROM: Full    Dental  (+) Dental Advisory Given   Pulmonary neg shortness of breath, neg sleep apnea, neg COPD, neg recent URI, former smoker   Pulmonary exam normal breath sounds clear to auscultation       Cardiovascular hypertension, (-) angina + CAD, + Past MI and +CHF (EF 40-45%)  (-) Cardiac Stents and (-) CABG + dysrhythmias (bigeminy) + Valvular Problems/Murmurs (severe) MR  Rhythm:Regular Rate:Normal + Systolic murmurs HLD  TTE 06/08/2024: IMPRESSIONS    1. The basal to mid inferior wall is aneurysmal. Left ventricular  ejection fraction, by estimation, is 40 to 45%. Left ventricular ejection  fraction by 2D MOD biplane is 41.1 %. The left ventricle has mildly  decreased function. The left ventricle  demonstrates regional wall motion abnormalities (see scoring  diagram/findings for description). The left ventricular internal cavity  size was mildly dilated. Left ventricular diastolic parameters are  consistent with Grade II diastolic dysfunction  (pseudonormalization). Elevated left ventricular end-diastolic pressure.  The E/e' is 22. There is severe akinesis of the left ventricular, entire  inferior wall.   2. Right ventricular systolic function is normal. The right ventricular  size is normal. Tricuspid regurgitation signal is inadequate for assessing  PA pressure.   3. Left atrial size was severely dilated.   4. Significant splay is noted posteriorly -likely from tethered posterior  leaflet given inferior wall infarct. The mitral valve is degenerative.  Severe mitral valve regurgitation.   5. The  aortic valve is tricuspid. Aortic valve regurgitation is not  visualized. Aortic valve sclerosis/calcification is present, without any  evidence of aortic stenosis.   6. The inferior vena cava is normal in size with greater than 50%  respiratory variability, suggesting right atrial pressure of 3 mmHg.     Neuro/Psych neg Seizures TIA (12/2022)   GI/Hepatic Neg liver ROS, Bowel prep,,,  Endo/Other  negative endocrine ROS    Renal/GU CRFRenal disease     Musculoskeletal  (+) Arthritis ,    Abdominal   Peds  Hematology  (+) Blood dyscrasia (MGUS), anemia Lab Results      Component                Value               Date                      WBC                      6.9                 06/23/2024                HGB                      11.1 (L)            06/24/2024                HCT                      33.0 (L)  06/24/2024                MCV                      87.9                06/23/2024                PLT                      193                 06/23/2024              Anesthesia Other Findings 83 y.o. female with medical history significant of ischemic cardiomyopathy with EF of 45%, left ventricular aneurysm on eliquis , coronary artery disease who presented with 6 episodes of hematochezia.  Hemoglobin of 4 on presentation.  Last Eliquis : 06/20/2024  Last Jardiance : yesterday  Reproductive/Obstetrics                              Anesthesia Physical Anesthesia Plan  ASA: 4  Anesthesia Plan: MAC   Post-op Pain Management: Minimal or no pain anticipated   Induction: Intravenous  PONV Risk Score and Plan: 2 and Propofol  infusion, TIVA and Treatment may vary due to age or medical condition  Airway Management Planned: Natural Airway and Simple Face Mask  Additional Equipment:   Intra-op Plan:   Post-operative Plan:   Informed Consent: I have reviewed the patients History and Physical, chart, labs and discussed the procedure  including the risks, benefits and alternatives for the proposed anesthesia with the patient or authorized representative who has indicated his/her understanding and acceptance.       Plan Discussed with: CRNA and Anesthesiologist  Anesthesia Plan Comments: (Discussed with patient risks of MAC including, but not limited to, minor pain or discomfort, hearing people in the room, and possible need for backup general anesthesia. Risks for general anesthesia also discussed including, but not limited to, sore throat, hoarse voice, chipped/damaged teeth, injury to vocal cords, nausea and vomiting, allergic reactions, lung infection, heart attack, stroke, and death. All questions answered. )         Anesthesia Quick Evaluation

## 2024-06-25 ENCOUNTER — Other Ambulatory Visit (HOSPITAL_COMMUNITY): Payer: Self-pay | Admitting: Internal Medicine

## 2024-06-25 DIAGNOSIS — K5731 Diverticulosis of large intestine without perforation or abscess with bleeding: Secondary | ICD-10-CM | POA: Diagnosis not present

## 2024-06-25 DIAGNOSIS — Z7901 Long term (current) use of anticoagulants: Secondary | ICD-10-CM | POA: Diagnosis not present

## 2024-06-25 DIAGNOSIS — K922 Gastrointestinal hemorrhage, unspecified: Secondary | ICD-10-CM | POA: Diagnosis not present

## 2024-06-25 DIAGNOSIS — K5520 Angiodysplasia of colon without hemorrhage: Secondary | ICD-10-CM | POA: Diagnosis not present

## 2024-06-25 LAB — HEMOGLOBIN AND HEMATOCRIT, BLOOD
HCT: 30.1 % — ABNORMAL LOW (ref 36.0–46.0)
HCT: 30.4 % — ABNORMAL LOW (ref 36.0–46.0)
Hemoglobin: 10.2 g/dL — ABNORMAL LOW (ref 12.0–15.0)
Hemoglobin: 10 g/dL — ABNORMAL LOW (ref 12.0–15.0)

## 2024-06-25 LAB — SURGICAL PATHOLOGY

## 2024-06-25 LAB — GLUCOSE, CAPILLARY: Glucose-Capillary: 103 mg/dL — ABNORMAL HIGH (ref 70–99)

## 2024-06-25 NOTE — Plan of Care (Signed)

## 2024-06-25 NOTE — TOC CM/SW Note (Signed)
 Transition of Care [facility]) - Inpatient Brief Assessment   Patient Details  Name: Faith Gray MRN: [id number] Date of Birth: November 23, 1940  Transition of Care [facility] CM/SW Contact:    Sudie Erminio Deems, RN Phone Number: 06/25/2024, 12:22 PM   Clinical Narrative: Patient presented for lower GI bleed. PTA patient states she was independent from home with grandson. Patient states she has insurance and PCP. Patient states she utilizes [employer] transportation to MD appointments. Patient has no issues getting meds; she has her medications delivered to the home. No further needs identified at this time.    Transition of Care Asessment: Insurance and Status: Insurance coverage has been reviewed Patient has primary care physician: Yes Home environment has been reviewed: reviewed Prior level of function:: indpendent Prior/Current Home Services: No current home services Social Drivers of Health Review: SDOH reviewed no interventions necessary Readmission risk has been reviewed: Yes Transition of care needs: no transition of care needs at this time

## 2024-06-25 NOTE — Progress Notes (Addendum)
 Progress Note   Subjective  No further bleeding, no bowel movements overnight. She is hungry, wants to eat. She does have sore throat post EGD.    Objective   Vital signs in last 24 hours: Temp:  [96.9 F (36.1 C)-98 F (36.7 C)] 97.9 F (36.6 C) (10/08 0800) Pulse Rate:  [70-86] 85 (10/08 0800) Resp:  [17-27] 21 (10/08 0800) BP: (117-157)/(81-98) 138/93 (10/08 0800) SpO2:  [94 %-100 %] 96 % (10/08 0800) Last BM Date : 06/23/24 General:    AA female in NAD Neurologic:  Alert and oriented,  grossly normal neurologically. Psych:  Cooperative. Normal mood and affect.  Intake/Output from previous day: 10/07 0701 - 10/08 0700 In: 1480 [P.O.:880; I.V.:600] Out: 410 [Urine:400; Blood:10] Intake/Output this shift: No intake/output data recorded.  Lab Results: Recent Labs    06/23/24 0746 06/23/24 1850 06/24/24 0535 06/24/24 1602 06/25/24 0500  WBC 6.9  --   --   --   --   HGB 6.9*   < > 11.1* 10.2* 10.2*  HCT 20.3*   < > 33.0* 30.5* 30.1*  PLT 193  --   --   --   --    < > = values in this interval not displayed.   BMET Recent Labs    06/23/24 0746 06/24/24 0535  NA 136 135  K 4.8 4.6  CL 110 108  CO2 18* 19*  GLUCOSE 87 89  BUN 37* 25*  CREATININE 1.52* 1.47*  CALCIUM  8.0* 9.0   LFT Recent Labs    06/23/24 0746  PROT 5.8*  ALBUMIN 2.8*  AST 25  ALT 29  ALKPHOS 61  BILITOT 0.6   PT/INR No results for input(s): LABPROT, INR in the last 72 hours.  Studies/Results: No results found.     Assessment / Plan:    83 y/o female here with the following:  Lower GI bleed - secondary to diverticulosis or colonic AVM - s/p colonoscopy Post hemorrhagic anemia Anticoagulated - Eliquis  held (history of LV aneurysm, CHF)  Status post EGD and colonoscopy yesterday.  EGD showed no evidence of upper tract bleeding.  Colonoscopy remarkable for a friable AVM in the right colon that was cauterized with APC.  She had significant blood in stool in the  colon making for challenging exam.  This was extensively lavaged and no active bleeding seen at the end of the exam.  She does have impressive left-sided diverticulosis.  She either had a lower GI bleed from diverticulosis versus the AVM that was cauterized.  She did have numerous polyps in her colon, most small to medium in size.  She did have 1 semi-pedunculated lesion in the left colon that I suspect is a large hyperplastic polyp but this was biopsied to clarify.  Post colonoscopy she has has not had any further bleeding, hemoglobin is stable.  We can advance her to soft diet today and observe for rebleeding.  If she does well today perhaps she can go home tomorrow.  I would continue to hold Eliquis  today.  Perhaps resume Eliquis  in a few days if she is otherwise stable.  She will need colonoscopy as outpatient for removal of polyps when she has recovered from her hospitalization.  I can coordinate outpatient follow-up for her after her discharge.  Of note, she does have a sore throat post EGD. This is either from EGD or suctioning during the procedure. Can continue cepacol, this should resolve in the next few days.  I  spoke with the patient and her daughter today about plan, they agree.  Will reassess her tomorrow.  Marcey Naval, MD [facility]

## 2024-06-25 NOTE — Progress Notes (Signed)
 PROGRESS NOTE  Faith Gray  FMW:[id number] DOB: 1941-05-05 DOA: 06/22/2024 PCP: Loris Elsie PARAS, PA-C   Brief Narrative: Faith Gray is a 83 y.o. female with medical history significant of ischemic cardiomyopathy with EF of 45%, left ventricular aneurysm on eliquis , coronary artery disease who presented with 6 episodes of hematochezia.  Hemoglobin of 4 on presentation.  Given 4 units of blood transfusion so far.  Hematochezia has stopped, hemoglobin stable.  Done with EGD/colonoscopy without finding of any acute bleeding.  Suspected to have AVMs or diverticular bleed.  Possible discharge tomorrow if hemoglobin is stable  Assessment & Plan:  Principal Problem:   Lower GI bleed Active Problems:   Chronic kidney disease, stage 3b (HCC)   CAD (coronary artery disease)   Hyperlipidemia   Aneurysm of left ventricle of heart   Chronic anticoagulation   Chronic combined systolic and diastolic congestive heart failure (HCC)   Acute blood loss anemia   AVM (arteriovenous malformation) of colon   Diverticulosis of colon with hemorrhage   Acute lower GI bleed/acute blood loss anemia/hypovolemic shock: Presented with 6 episodes of hematochezia.  Bright blood red per rectum.  Takes Eliquis  at home which is on hold.  Denies any abdominal pain, nausea or vomiting, no rectal pain.  On presentation, her hemoglobin was in the range of 4.  Currently blood pressure stable.  Being given 4 units of blood transfusion so far.  GI consulted.Hematochezia stopped at this time.  Hemoglobin in the range of 10.2 today. EGD showed normal stomach, duodenum.  Colonoscopy showed significant stool and blood in the colon which was lavaged.  AVM was noted at the IC valve, treated with argon plasma coagulation.Also found to have multiple polyp in the transverse colon, diverticulosis.  Diverticular bleed versus bleeding from AVM at the IC valve suspected.  Diet advanced to soft today   Chronic HFrEF/severe mitral  regurgitation: Currently appears euvolemic.  Takes torsemide  at home.Will hold torsemide  for now due to hypotension. She follows with Dr. Bensimhon Previously she was on Coreg , Entresto , spironolactone , Jardiance , Imdur  at home. On her last hospitalization, she remained  hypotensive show most of her cardiac medications are on hold.  Echo done on last admission showed EF of 40-45 EF, grade 2 diastolic dysfunction, severe mitral valve regurgitation.  Cardiology recommended had recommended right heart cath but she did not  not want to do any kind of procedure. Palliative care was consulted and she scheduled outpatient follow-up.  She had an appointment  with cardiology on 9/30.  MitraClip was discussed during last admission, she was not interested.  Jardiance  to be continued   History of coronary artery disease: No anginal symptoms.  On atorvastatin , Zetia .   CKD stage 3A: Baseline creatinine 1.3.  Last creatinine in the range of 1.4   MGUS: Follows with hematology at [facility]   Left ventricular aneurysm: On Eliquis ,now on hold.  Patient and her daughter are clear that they do not want to continue Eliquis  from now onward.  We have recommended to follow-up with her cardiologist and discuss about this as an outpatient   Leukocytosis: Most likely reactive.  resolved        DVT prophylaxis:SCDs Start: 06/22/24 1044     Code Status: Full Code  Family Communication: called and discussed with daughter Christal on 10/5  Patient status:Inpatient  Patient is from :home  Anticipated discharge un:ynfz  Estimated DC date:tomorrow   Consultants: GI  Procedures:EGD/colonoscopy  Antimicrobials:  Anti-infectives (From admission, onward)    None  Subjective: Patient seen and examined at bedside today.  Hemodynamically stable.  Overall comfortable.  Lying on bed. No new complaints except for some throat pain which could be from endoscopy.  Denies any hematochezia or melena.  No  abdominal pain, nausea or vomiting.  Objective: Vitals:   06/24/24 1944 06/24/24 2333 06/25/24 0421 06/25/24 0800  BP: 132/88 133/84 126/89 (!) 138/93  Pulse: 79 70 81 85  Resp: 20 19 20  (!) 21  Temp: 98 F (36.7 C) 98 F (36.7 C) 97.7 F (36.5 C) 97.9 F (36.6 C)  TempSrc: Oral Oral Oral Oral  SpO2: 97% 96% 100% 96%  Weight:      Height:        Intake/Output Summary (Last 24 hours) at 06/25/2024 1117 Last data filed at 06/25/2024 0422 Gross per 24 hour  Intake 1480 ml  Output 410 ml  Net 1070 ml   Filed Weights   06/22/24 0858  Weight: 45.3 kg    Examination:  General exam: Overall comfortable, not in distress,pleasant elderly female HEENT: PERRL Respiratory system:  no wheezes or crackles  Cardiovascular system: S1 & S2 heard, RRR.  Gastrointestinal system: Abdomen is nondistended, soft and nontender. Central nervous system: Alert and oriented Extremities: No edema, no clubbing ,no cyanosis Skin: No rashes, no ulcers,no icterus     Data Reviewed: I have personally reviewed following labs and imaging studies  CBC: Recent Labs  Lab 06/22/24 0905 06/22/24 1830 06/23/24 0746 06/23/24 1850 06/24/24 0535 06/24/24 1602 06/25/24 0500  WBC 12.0*  --  6.9  --   --   --   --   NEUTROABS 9.8*  --   --   --   --   --   --   HGB 4.0*   < > 6.9* 11.6* 11.1* 10.2* 10.2*  HCT 13.2*   < > 20.3* 34.3* 33.0* 30.5* 30.1*  MCV 94.3  --  87.9  --   --   --   --   PLT 235  --  193  --   --   --   --    < > = values in this interval not displayed.   Basic Metabolic Panel: Recent Labs  Lab 06/22/24 0905 06/23/24 0746 06/24/24 0535  NA 136 136 135  K 4.5 4.8 4.6  CL 110 110 108  CO2 16* 18* 19*  GLUCOSE 141* 87 89  BUN 58* 37* 25*  CREATININE 1.64* 1.52* 1.47*  CALCIUM  7.7* 8.0* 9.0     No results found for this or any previous visit (from the past 240 hours).   Radiology Studies: No results found.   Scheduled Meds:  sodium chloride    Intravenous Once    atorvastatin   40 mg Oral Daily   empagliflozin   10 mg Oral Daily   ezetimibe   10 mg Oral Daily   ferrous sulfate  325 mg Oral QPC breakfast   pantoprazole (PROTONIX) IV  40 mg Intravenous Q12H   Continuous Infusions:     LOS: 3 days   Ivonne Mustache, MD [facility] P10/04/2024, 11:17 AM

## 2024-06-26 ENCOUNTER — Ambulatory Visit: Payer: Self-pay | Admitting: Gastroenterology

## 2024-06-26 ENCOUNTER — Telehealth: Payer: Self-pay

## 2024-06-26 ENCOUNTER — Other Ambulatory Visit (HOSPITAL_COMMUNITY): Payer: Self-pay

## 2024-06-26 DIAGNOSIS — Z7901 Long term (current) use of anticoagulants: Secondary | ICD-10-CM | POA: Diagnosis not present

## 2024-06-26 DIAGNOSIS — K5731 Diverticulosis of large intestine without perforation or abscess with bleeding: Secondary | ICD-10-CM | POA: Diagnosis not present

## 2024-06-26 DIAGNOSIS — K5520 Angiodysplasia of colon without hemorrhage: Secondary | ICD-10-CM | POA: Diagnosis not present

## 2024-06-26 DIAGNOSIS — K922 Gastrointestinal hemorrhage, unspecified: Secondary | ICD-10-CM

## 2024-06-26 LAB — BPAM RBC
Blood Product Expiration Date: 202510222359
Blood Product Expiration Date: 202510222359
Blood Product Expiration Date: 202510252359
Blood Product Expiration Date: 202510282359
Blood Product Expiration Date: 202511012359
ISSUE DATE / TIME: 202510051016
ISSUE DATE / TIME: 202510051216
ISSUE DATE / TIME: 202510061011
ISSUE DATE / TIME: 202510061414
Unit Type and Rh: 1700
Unit Type and Rh: 1700
Unit Type and Rh: 1700
Unit Type and Rh: 9500
Unit Type and Rh: 9500

## 2024-06-26 LAB — TYPE AND SCREEN
ABO/RH(D): B NEG
Antibody Screen: NEGATIVE
Unit division: 0
Unit division: 0
Unit division: 0
Unit division: 0
Unit division: 0

## 2024-06-26 LAB — HEMOGLOBIN AND HEMATOCRIT, BLOOD
HCT: 29.3 % — ABNORMAL LOW (ref 36.0–46.0)
Hemoglobin: 9.7 g/dL — ABNORMAL LOW (ref 12.0–15.0)

## 2024-06-26 MED ORDER — FERROUS SULFATE 325 (65 FE) MG PO TABS
325.0000 mg | ORAL_TABLET | Freq: Every day | ORAL | 0 refills | Status: AC
  Filled 2024-06-26: qty 30, 30d supply, fill #0

## 2024-06-26 NOTE — Telephone Encounter (Signed)
 Patient scheduled for an appointment with Dr. Leigh on Friday, 12-26 at 11am.  Order for Hgb entered and reminder to contact patient on Monday of next week with reminder about CBC and appointment in December placed

## 2024-06-26 NOTE — Discharge Summary (Signed)
 Physician Discharge Summary  Faith Gray FMW:[id number] DOB: [birth year] DOA: 06/22/2024  PCP: Loris Elsie PARAS, PA-C  Admit date: 06/22/2024 Discharge date: 06/26/2024  Admitted From: Home Disposition:  Home  Discharge Condition:Stable CODE STATUS:FULL Diet recommendation: Heart Healthy   Brief/Interim Summary: Faith Gray is a 83 y.o. female with medical history significant of ischemic cardiomyopathy with EF of 45%, left ventricular aneurysm on eliquis , coronary artery disease who presented with 6 episodes of hematochezia.  Hemoglobin of 4 on presentation.  Given 4 units of blood transfusion so far.  Hematochezia has stopped, hemoglobin stable.  Done with EGD/colonoscopy without finding of any acute bleeding.  Suspected to have AVMs or diverticular bleed.  Hemoglobin stable.GI cleared for dc.  Medically stable for discharge today  Following problems were addressed during the hospitalization:   Acute lower GI bleed/acute blood loss anemia/hypovolemic shock: Presented with 6 episodes of hematochezia.  Bright blood red per rectum.  Takes Eliquis  at home which is on hold.  Denies any abdominal pain, nausea or vomiting, no rectal pain.  On presentation, her hemoglobin was in the range of 4.  Currently blood pressure stable.  Being given 4 units of blood transfusion so far.  GI consulted.Hematochezia stopped at this time.  Hemoglobin in the range of 9  today EGD showed normal stomach, duodenum.  Colonoscopy showed significant stool and blood in the colon which was lavaged.  AVM was noted at the IC valve, treated with argon plasma coagulation.Also found to have multiple polyp in the transverse colon, diverticulosis.  Diverticular bleed versus bleeding from AVM at the IC valve suspected.  Diet advanced to solid ans she is tolerating   Chronic HFrEF/severe mitral regurgitation: Currently appears euvolemic.  Takes torsemide  at home.Will hold torsemide  for now due to hypotension. She follows with Dr.  Bensimhon Previously she was on Coreg , Entresto , spironolactone , Jardiance , Imdur  at home. On her last hospitalization, she remained  hypotensive show most of her cardiac medications are on hold.  Echo done on last admission showed EF of 40-45 EF, grade 2 diastolic dysfunction, severe mitral valve regurgitation.  Cardiology recommended had recommended right heart cath but she did not  not want to do any kind of procedure. Palliative care was consulted and she scheduled outpatient follow-up.  She had an appointment  with cardiology on 9/30.  MitraClip was discussed during last admission, she was not interested.  Jardiance  to be continued   History of coronary artery disease: No anginal symptoms.  On atorvastatin , Zetia .   CKD stage 3A: Baseline creatinine 1.3.  Last creatinine in the range of 1.4   MGUS: Follows with hematology at [facility]   Left ventricular aneurysm: On Eliquis ,now on hold.  Patient and her daughter are clear that they do not want to continue Eliquis  from now onward.  We have recommended to follow-up with her cardiologist and discuss about this as an outpatient   Leukocytosis: Most likely reactive.  resolved    Discharge Diagnoses:  Principal Problem:   Lower GI bleed Active Problems:   Chronic kidney disease, stage 3b (HCC)   CAD (coronary artery disease)   Hyperlipidemia   Aneurysm of left ventricle of heart   Chronic anticoagulation   Chronic combined systolic and diastolic congestive heart failure (HCC)   Acute blood loss anemia   AVM (arteriovenous malformation) of colon   Diverticulosis of colon with hemorrhage    Discharge Instructions  Discharge Instructions     Diet - low sodium heart healthy   Complete by: As directed  Discharge instructions   Complete by: As directed    1)Please take your medications as instructed 2)Follow-up with your cardiologist as an outpatient.  You will be called for appointment 3 )Follow up with palliative care as an  outpatient.   Increase activity slowly   Complete by: As directed       Allergies as of 06/26/2024       Reactions   Vicodin [hydrocodone-acetaminophen ] Nausea And Vomiting        Medication List     PAUSE taking these medications    Eliquis  2.5 MG Tabs tablet Wait to take this until: June 30, 2024 Generic drug: apixaban  Take 1 tablet (2.5 mg total) by mouth 2 (two) times daily.       STOP taking these medications    ezetimibe  10 MG tablet Commonly known as: Zetia    ferrous sulfate 325 (65 FE) MG EC tablet Replaced by: ferrous sulfate 325 (65 FE) MG tablet   isosorbide  mononitrate 30 MG 24 hr tablet Commonly known as: IMDUR    pregabalin  25 MG capsule Commonly known as: LYRICA        TAKE these medications    atorvastatin  80 MG tablet Commonly known as: LIPITOR  TAKE 1 TABLET BY MOUTH DAILY AT 6PM   empagliflozin  10 MG Tabs tablet Commonly known as: Jardiance  Take 1 tablet (10 mg total) by mouth daily.   ferrous sulfate 325 (65 FE) MG tablet Take 1 tablet (325 mg total) by mouth daily after breakfast. Start taking on: June 27, 2024 Replaces: ferrous sulfate 325 (65 FE) MG EC tablet   TYLENOL  500 MG tablet Generic drug: acetaminophen  Take 500-1,000 mg by mouth every 6 (six) hours as needed for mild pain or headache.        Follow-up Information     Loris Elsie PARAS, PA-C. Schedule an appointment as soon as possible for a visit in 1 week(s).   Specialty: Physician Assistant Contact information: [address] [place] [address] [phone]                Allergies  Allergen Reactions   Vicodin [Hydrocodone-Acetaminophen ] Nausea And Vomiting    Consultations: gastroenterology   Procedures/Studies: DG Chest 1 View Result Date: 06/22/2024 CLINICAL DATA:  Weakness. EXAM: CHEST  1 VIEW COMPARISON:  [id number] FINDINGS: The heart size and mediastinal contours are within normal limits. There is no evidence of  pulmonary edema, consolidation, pneumothorax or pleural fluid. The visualized skeletal structures are unremarkable. IMPRESSION: No active disease. Electronically Signed   By: Marcey Moan M.D.   On: 06/22/2024 09:55   ECHOCARDIOGRAM COMPLETE Result Date: 06/08/2024    ECHOCARDIOGRAM REPORT   Patient Name:   MERRILYN LEGLER Date of Exam: 06/08/2024 Medical Rec #:  [id number]     Height:       59.0 in Accession #:    [id number]    Weight:       103.0 lb Date of Birth:  [birth year]     BSA:          1.391 m Patient Age:    83 years      BP:           96/57 mmHg Patient Gender: F             HR:           48 bpm. Exam Location:  Inpatient Procedure: 2D Echo (Both Spectral and Color Flow Doppler were utilized during  procedure). Indications:    CHF  History:        Patient has prior history of Echocardiogram examinations. CHF,                 CAD; Signs/Symptoms:Shortness of Breath.  Sonographer:    Norleen Amour Referring Phys: [phone] KENNETH C HILTY IMPRESSIONS  1. The basal to mid inferior wall is aneurysmal. Left ventricular ejection fraction, by estimation, is 40 to 45%. Left ventricular ejection fraction by 2D MOD biplane is 41.1 %. The left ventricle has mildly decreased function. The left ventricle demonstrates regional wall motion abnormalities (see scoring diagram/findings for description). The left ventricular internal cavity size was mildly dilated. Left ventricular diastolic parameters are consistent with Grade II diastolic dysfunction (pseudonormalization). Elevated left ventricular end-diastolic pressure. The E/e' is 22. There is severe akinesis of the left ventricular, entire inferior wall.  2. Right ventricular systolic function is normal. The right ventricular size is normal. Tricuspid regurgitation signal is inadequate for assessing PA pressure.  3. Left atrial size was severely dilated.  4. Significant splay is noted posteriorly -likely from tethered posterior leaflet given inferior wall  infarct. The mitral valve is degenerative. Severe mitral valve regurgitation.  5. The aortic valve is tricuspid. Aortic valve regurgitation is not visualized. Aortic valve sclerosis/calcification is present, without any evidence of aortic stenosis.  6. The inferior vena cava is normal in size with greater than 50% respiratory variability, suggesting right atrial pressure of 3 mmHg. Comparison(s): Changes from prior study are noted. 12/14/2023: LVEF 40-45%, grade 2 DD, moderate MR. FINDINGS  Left Ventricle: The basal to mid inferior wall is aneurysmal. Left ventricular ejection fraction, by estimation, is 40 to 45%. Left ventricular ejection fraction by 2D MOD biplane is 41.1 %. The left ventricle has mildly decreased function. The left ventricle demonstrates regional wall motion abnormalities. Severe akinesis of the left ventricular, entire inferior wall. The left ventricular internal cavity size was mildly dilated. There is no left ventricular hypertrophy. Left ventricular diastolic parameters are consistent with Grade II diastolic dysfunction (pseudonormalization). Elevated left ventricular end-diastolic pressure. The E/e' is 22.  LV Wall Scoring: The posterior wall is aneurysmal. The apical lateral segment and apical inferior segment are hypokinetic. Right Ventricle: The right ventricular size is normal. No increase in right ventricular wall thickness. Right ventricular systolic function is normal. Tricuspid regurgitation signal is inadequate for assessing PA pressure. Left [facility]: Left atrial size was severely dilated. Right [facility]: Right atrial size was normal in size. Pericardium: There is no evidence of pericardial effusion. 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 The mitral valve is degenerative in appearance. There is moderate calcification of the anterior and posterior mitral valve leaflet(s). Severe mitral valve  regurgitation, with posteriorly-directed jet. MV peak gradient, 8.5 mmHg. The mean mitral valve gradient is 2.0 mmHg. Tricuspid Valve: The tricuspid valve is grossly normal. Tricuspid valve regurgitation is trivial. Aortic Valve: The aortic valve is tricuspid. Aortic valve regurgitation is not visualized. Aortic valve sclerosis/calcification is present, without any evidence of aortic stenosis. Aortic valve mean gradient measures 5.0 mmHg. Aortic valve peak gradient measures 10.1 mmHg. Aortic valve area, by VTI measures 0.52 cm. Pulmonic Valve: The pulmonic valve was grossly normal. Pulmonic valve regurgitation is trivial. Aorta: The aortic root and ascending aorta are structurally normal, with no evidence of dilitation. Venous: The inferior vena cava is normal in size with greater than 50% respiratory variability, suggesting right atrial pressure of 3 mmHg.  IAS/Shunts: No atrial level shunt detected by color flow Doppler.  LEFT VENTRICLE PLAX 2D                        Biplane EF (MOD) LVIDd:         5.80 cm         LV Biplane EF:   Left LVIDs:         4.10 cm                          ventricular LV PW:         0.80 cm                          ejection LV IVS:        0.70 cm                          fraction by LVOT diam:     1.50 cm                          2D MOD LV SV:         18                               biplane is LV SV Index:   13                               41.1 %. LVOT Area:     1.77 cm                                Diastology                                LV e' medial:    6.42 cm/s LV Volumes (MOD)               LV E/e' medial:  21.2 LV vol d, MOD    224.0 ml      LV e' lateral:   5.98 cm/s A2C:                           LV E/e' lateral: 22.7 LV vol d, MOD    148.0 ml A4C: LV vol s, MOD    141.0 ml A2C: LV vol s, MOD    80.5 ml A4C: LV SV MOD A2C:   83.0 ml LV SV MOD A4C:   148.0 ml LV SV MOD BP:    79.2 ml RIGHT VENTRICLE             IVC RV Basal diam:  2.60 cm     IVC diam: 1.20 cm RV S prime:      12.80 cm/s TAPSE (M-mode): 2.3 cm LEFT ATRIUM            Index        RIGHT ATRIUM           Index LA diam:      4.70 cm  3.38 cm/m   RA Area:  11.00 cm LA Vol (A2C): 71.8 ml  51.61 ml/m  RA Volume:   25.10 ml  18.04 ml/m LA Vol (A4C): 101.0 ml 72.60 ml/m  AORTIC VALVE                     PULMONIC VALVE AV Area (Vmax):    0.63 cm      PV Vmax:       0.85 m/s AV Area (Vmean):   0.65 cm      PV Peak grad:  2.9 mmHg AV Area (VTI):     0.52 cm AV Vmax:           159.00 cm/s AV Vmean:          104.000 cm/s AV VTI:            0.353 m AV Peak Grad:      10.1 mmHg AV Mean Grad:      5.0 mmHg LVOT Vmax:         56.40 cm/s LVOT Vmean:        38.500 cm/s LVOT VTI:          0.104 m LVOT/AV VTI ratio: 0.29  AORTA Ao Root diam: 2.70 cm Ao Asc diam:  2.20 cm MITRAL VALVE MV Area (PHT): 1.79 cm       SHUNTS MV Area VTI:   0.37 cm       Systemic VTI:  0.10 m MV Peak grad:  8.5 mmHg       Systemic Diam: 1.50 cm MV Mean grad:  2.0 mmHg MV Vmax:       1.46 m/s MV Vmean:      68.9 cm/s MV Decel Time: 424 msec MR Peak grad:    81.0 mmHg MR Mean grad:    50.0 mmHg MR Vmax:         450.00 cm/s MR Vmean:        337.0 cm/s MR PISA:         1.90 cm MR PISA Eff ROA: 13 mm MR PISA Radius:  0.55 cm MV E velocity: 136.00 cm/s MV A velocity: 87.50 cm/s MV E/A ratio:  1.55 Vinie Maxcy MD Electronically signed by Vinie Maxcy MD Signature Date/Time: 06/08/2024/12:25:50 PM    Final    DG Chest 2 View Result Date: 06/05/2024 EXAM: 2 VIEW(S) XRAY OF THE CHEST 06/05/2024 11:50:16 AM COMPARISON: 12/13/2023 CLINICAL HISTORY: Shortness of breath, cough, h/o CHF. Pt reports productive cough, nasal congestion, and constant shortness of breath x2 days. Cold symptoms started yesterday and SOB became more noticeable today. SOB even at rest. Pulse oximetry 90-95% in triage. Tachypneic. Pt has heart failure - last exacerbation March 2025. Coricidin taken at home with little relief. Denies fevers and chills. Pt also notes mild sore throat  today. FINDINGS: LUNGS AND PLEURA: Chronic interstitial markings without acute opacity. No pleural effusion. No pneumothorax. HEART AND MEDIASTINUM: Atherosclerotic aortic calcifications. No acute abnormality of the cardiac and mediastinal silhouettes. BONES AND SOFT TISSUES: No acute osseous abnormality. IMPRESSION: 1. No acute findings. 2. Chronic interstitial markings without acute opacity. Electronically signed by: Donnice Mania MD 06/05/2024 01:28 PM EDT RP Workstation: [id number]      Subjective: Patient seen and examined at bedside today.  Very comfortable.  No new complaints today.  Medically stable for discharge  Discharge Exam: Vitals:   06/26/24 0411 06/26/24 0753  BP: (!) 125/91 121/89  Pulse: 87 72  Resp: (!) 21 (!) 21  Temp: 98.1 F (36.7  C) 97.9 F (36.6 C)  SpO2: 100% 100%   Vitals:   06/25/24 1932 06/25/24 2336 06/26/24 0411 06/26/24 0753  BP: 126/80 123/81 (!) 125/91 121/89  Pulse: 80 80 87 72  Resp: 18 (!) 23 (!) 21 (!) 21  Temp: 98.1 F (36.7 C) 97.8 F (36.6 C) 98.1 F (36.7 C) 97.9 F (36.6 C)  TempSrc: Oral Oral Oral Oral  SpO2: 99% 99% 100% 100%  Weight:      Height:        General: Pt is alert, awake, not in acute distress Cardiovascular: RRR, S1/S2 +, no rubs, no gallops Respiratory: CTA bilaterally, no wheezing, no rhonchi Abdominal: Soft, NT, ND, bowel sounds + Extremities: no edema, no cyanosis    The results of significant diagnostics from this hospitalization (including imaging, microbiology, ancillary and laboratory) are listed below for reference.     Microbiology: No results found for this or any previous visit (from the past 240 hours).   Labs: BNP (last 3 results) Recent Labs    12/13/23 1129  BNP 3,144.7*   Basic Metabolic Panel: Recent Labs  Lab 06/22/24 0905 06/23/24 0746 06/24/24 0535  NA 136 136 135  K 4.5 4.8 4.6  CL 110 110 108  CO2 16* 18* 19*  GLUCOSE 141* 87 89  BUN 58* 37* 25*  CREATININE 1.64* 1.52*  1.47*  CALCIUM  7.7* 8.0* 9.0   Liver Function Tests: Recent Labs  Lab 06/22/24 0905 06/23/24 0746  AST 25 25  ALT 25 29  ALKPHOS 70 61  BILITOT 0.4 0.6  PROT 6.1* 5.8*  ALBUMIN 2.9* 2.8*   No results for input(s): LIPASE, AMYLASE in the last 168 hours. No results for input(s): AMMONIA in the last 168 hours. CBC: Recent Labs  Lab 06/22/24 0905 06/22/24 1830 06/23/24 0746 06/23/24 1850 06/24/24 0535 06/24/24 1602 06/25/24 0500 06/25/24 1809 06/26/24 0417  WBC 12.0*  --  6.9  --   --   --   --   --   --   NEUTROABS 9.8*  --   --   --   --   --   --   --   --   HGB 4.0*   < > 6.9*   < > 11.1* 10.2* 10.2* 10.0* 9.7*  HCT 13.2*   < > 20.3*   < > 33.0* 30.5* 30.1* 30.4* 29.3*  MCV 94.3  --  87.9  --   --   --   --   --   --   PLT 235  --  193  --   --   --   --   --   --    < > = values in this interval not displayed.   Cardiac Enzymes: No results for input(s): CKTOTAL, CKMB, CKMBINDEX, TROPONINI in the last 168 hours. BNP: Invalid input(s): POCBNP CBG: Recent Labs  Lab 06/25/24 1121  GLUCAP 103*   D-Dimer No results for input(s): DDIMER in the last 72 hours. Hgb A1c No results for input(s): HGBA1C in the last 72 hours. Lipid Profile No results for input(s): CHOL, HDL, LDLCALC, TRIG, CHOLHDL, LDLDIRECT in the last 72 hours. Thyroid function studies No results for input(s): TSH, T4TOTAL, T3FREE, THYROIDAB in the last 72 hours.  Invalid input(s): FREET3 Anemia work up No results for input(s): VITAMINB12, FOLATE, FERRITIN, TIBC, IRON , RETICCTPCT in the last 72 hours. Urinalysis    Component Value Date/Time   COLORURINE STRAW (A) 01/07/2023 2113   APPEARANCEUR CLEAR 01/07/2023 2113  LABSPEC 1.006 01/07/2023 2113   PHURINE 6.0 01/07/2023 2113   GLUCOSEU NEGATIVE 01/07/2023 2113   HGBUR SMALL (A) 01/07/2023 2113   BILIRUBINUR NEGATIVE 01/07/2023 2113   KETONESUR NEGATIVE 01/07/2023 2113   PROTEINUR  NEGATIVE 01/07/2023 2113   NITRITE NEGATIVE 01/07/2023 2113   LEUKOCYTESUR NEGATIVE 01/07/2023 2113   Sepsis Labs Recent Labs  Lab 06/22/24 0905 06/23/24 0746  WBC 12.0* 6.9   Microbiology No results found for this or any previous visit (from the past 240 hours).  Please note: You were cared for by a hospitalist during your hospital stay. Once you are discharged, your primary care physician will handle any further medical issues. Please note that NO REFILLS for any discharge medications will be authorized once you are discharged, as it is imperative that you return to your primary care physician (or establish a relationship with a primary care physician if you do not have one) for your post hospital discharge needs so that they can reassess your need for medications and monitor your lab values.    Time coordinating discharge: 40 minutes  SIGNED:   Ivonne Mustache, MD  [facility] 06/26/2024, 10:48 AM Pager [phone]  If 7PM-7AM, please contact night-coverage [email or web address] Password [email or web address]

## 2024-06-26 NOTE — Plan of Care (Signed)
  Problem: Education: Goal: Knowledge of General Education information will improve Description: Including pain rating scale, medication(s)/side effects and non-pharmacologic comfort measures 06/26/2024 1133 by Emil Roselie SAUNDERS, RN Outcome: Adequate for Discharge 06/26/2024 1133 by Emil Roselie SAUNDERS, RN Outcome: Adequate for Discharge   Problem: Health Behavior/Discharge Planning: Goal: Ability to manage health-related needs will improve 06/26/2024 1133 by Emil Roselie SAUNDERS, RN Outcome: Adequate for Discharge 06/26/2024 1133 by Emil Roselie SAUNDERS, RN Outcome: Adequate for Discharge   Problem: Clinical Measurements: Goal: Ability to maintain clinical measurements within normal limits will improve 06/26/2024 1133 by Emil Roselie SAUNDERS, RN Outcome: Adequate for Discharge 06/26/2024 1133 by Emil Roselie SAUNDERS, RN Outcome: Adequate for Discharge Goal: Will remain free from infection 06/26/2024 1133 by Emil Roselie SAUNDERS, RN Outcome: Adequate for Discharge 06/26/2024 1133 by Emil Roselie SAUNDERS, RN Outcome: Adequate for Discharge Goal: Diagnostic test results will improve 06/26/2024 1133 by Emil Roselie SAUNDERS, RN Outcome: Adequate for Discharge 06/26/2024 1133 by Emil Roselie SAUNDERS, RN Outcome: Adequate for Discharge Goal: Respiratory complications will improve 06/26/2024 1133 by Emil Roselie SAUNDERS, RN Outcome: Adequate for Discharge 06/26/2024 1133 by Emil Roselie SAUNDERS, RN Outcome: Adequate for Discharge Goal: Cardiovascular complication will be avoided 06/26/2024 1133 by Emil Roselie SAUNDERS, RN Outcome: Adequate for Discharge 06/26/2024 1133 by Emil Roselie SAUNDERS, RN Outcome: Adequate for Discharge   Problem: Activity: Goal: Risk for activity intolerance will decrease 06/26/2024 1133 by Emil Roselie SAUNDERS, RN Outcome: Adequate for Discharge 06/26/2024 1133 by Emil Roselie SAUNDERS, RN Outcome: Adequate for Discharge   Problem: Nutrition: Goal: Adequate nutrition will be  maintained 06/26/2024 1133 by Emil Roselie SAUNDERS, RN Outcome: Adequate for Discharge 06/26/2024 1133 by Emil Roselie SAUNDERS, RN Outcome: Adequate for Discharge   Problem: Coping: Goal: Level of anxiety will decrease 06/26/2024 1133 by Emil Roselie SAUNDERS, RN Outcome: Adequate for Discharge 06/26/2024 1133 by Emil Roselie SAUNDERS, RN Outcome: Adequate for Discharge   Problem: Elimination: Goal: Will not experience complications related to bowel motility 06/26/2024 1133 by Emil Roselie SAUNDERS, RN Outcome: Adequate for Discharge 06/26/2024 1133 by Emil Roselie SAUNDERS, RN Outcome: Adequate for Discharge Goal: Will not experience complications related to urinary retention 06/26/2024 1133 by Emil Roselie SAUNDERS, RN Outcome: Adequate for Discharge 06/26/2024 1133 by Emil Roselie SAUNDERS, RN Outcome: Adequate for Discharge   Problem: Pain Managment: Goal: General experience of comfort will improve and/or be controlled 06/26/2024 1133 by Emil Roselie SAUNDERS, RN Outcome: Adequate for Discharge 06/26/2024 1133 by Emil Roselie SAUNDERS, RN Outcome: Adequate for Discharge   Problem: Safety: Goal: Ability to remain free from injury will improve 06/26/2024 1133 by Emil Roselie SAUNDERS, RN Outcome: Adequate for Discharge 06/26/2024 1133 by Emil Roselie SAUNDERS, RN Outcome: Adequate for Discharge   Problem: Skin Integrity: Goal: Risk for impaired skin integrity will decrease 06/26/2024 1133 by Emil Roselie SAUNDERS, RN Outcome: Adequate for Discharge 06/26/2024 1133 by Emil Roselie SAUNDERS, RN Outcome: Adequate for Discharge

## 2024-06-26 NOTE — Telephone Encounter (Signed)
-----   Message from Elspeth SHAUNNA Naval sent at 06/26/2024  8:47 AM EDT ----- Regarding: outpatient labs / follow up This patient will need outpatient follow up with me or APP in the next 6-8 weeks.  Jan she will also need a Hgb checked next week if you can order for her and send her mychart message next Monday to remind her. Thanks!

## 2024-06-26 NOTE — Plan of Care (Signed)
   Problem: Health Behavior/Discharge Planning: Goal: Ability to manage health-related needs will improve Outcome: Progressing   Problem: Clinical Measurements: Goal: Ability to maintain clinical measurements within normal limits will improve Outcome: Progressing Goal: Will remain free from infection Outcome: Progressing Goal: Diagnostic test results will improve Outcome: Progressing

## 2024-06-26 NOTE — Care Management Important Message (Signed)
 Important Message  Patient Details  Name: Faith Gray MRN: [id number] Date of Birth: 1941-09-13   Important Message Given:  Yes - Medicare IM     [name] 06/26/2024, 10:31 AM

## 2024-06-26 NOTE — Progress Notes (Signed)
 Progress Note   Subjective  Patient doing well - eating, no further bleeding. She does have a sore throat since the procedure.    Objective   Vital signs in last 24 hours: Temp:  [97.8 F (36.6 C)-98.6 F (37 C)] 97.9 F (36.6 C) (10/09 0753) Pulse Rate:  [72-87] 72 (10/09 0753) Resp:  [18-23] 21 (10/09 0753) BP: (121-128)/(80-92) 121/89 (10/09 0753) SpO2:  [98 %-100 %] 100 % (10/09 0753) Last BM Date : 06/25/24 General:    AA female in NAD Neurologic:  Alert and oriented,  grossly normal neurologically. Psych:  Cooperative. Normal mood and affect.  Intake/Output from previous day: 10/08 0701 - 10/09 0700 In: 700 [P.O.:700] Out: 1100 [Urine:1100] Intake/Output this shift: No intake/output data recorded.  Lab Results: Recent Labs    06/25/24 0500 06/25/24 1809 06/26/24 0417  HGB 10.2* 10.0* 9.7*  HCT 30.1* 30.4* 29.3*   BMET Recent Labs    06/24/24 0535  NA 135  K 4.6  CL 108  CO2 19*  GLUCOSE 89  BUN 25*  CREATININE 1.47*  CALCIUM  9.0   LFT No results for input(s): PROT, ALBUMIN, AST, ALT, ALKPHOS, BILITOT, BILIDIR, IBILI in the last 72 hours. PT/INR No results for input(s): LABPROT, INR in the last 72 hours.  Studies/Results: No results found.     Assessment / Plan:    83 y/o female here with the following:   Lower GI bleed - secondary to diverticulosis or colonic AVM - s/p colonoscopy Post hemorrhagic anemia Anticoagulated - Eliquis  held (history of LV aneurysm, CHF)   Status post EGD and colonoscopy on 10/7. EGD showed no evidence of upper tract bleeding.  Colonoscopy remarkable for a friable AVM in the right colon that was cauterized with APC.  She had significant blood in stool in the colon making for challenging exam.  This was extensively lavaged and no active bleeding seen at the end of the exam.  She does have impressive left-sided diverticulosis.  I believe she either had a lower GI bleed from diverticulosis  versus the AVM that was cauterized.  She did have numerous polyps in her colon, most small to medium in size.  She did have 1 semi-pedunculated lesion in the left colon that I suspect is a large hyperplastic polyp but this was biopsied to clarify. Biopsies confirm this to be hyperplastic / benign.   Doing well since colonoscopy. Eating okay, no further bleeding. Hgb has drifted slightly in the setting of blood draws, but no evidence of rebleeding.   Discussed course with her and her grandson who was in the room. I think she is okay to go home today. Would not resume Eliquis  for another 3 days or so if possible given her significant bleeding that occurred and make sure stable before she resumes that. I recommend she come to our office for a blood draw next week to check Hgb to make sure stable, and we will coordinate outpatient follow up with her as well.  She will need colonoscopy as outpatient for removal of polyps when she has recovered from her hospitalization.     Of note, she does have a sore throat post EGD. On exam today she does have a red spot / mucosal irritation in the left posterior pharynx I suspect from suctioning per anesthesia during the procedure. This should resolve in upcoming days, provided reassurance, she can use cepecol lozenges, etc.   PLAN: - okay for discharge home today - resume Eliquis  in  3 days if she is otherwise stable - repeat Hgb next week at our office - no appointment needed, she should come early next week for a blood draw - we will coordinate outpatient follow up and discuss timing of repeat colonoscopy for polypectomy - if she has any recurrent bleeding when she resumes Eliquis  she needs to contact us  or seek re-evaluation  Call with questions, we will sign off for now.   Marcey Naval, MD [facility]

## 2024-06-30 ENCOUNTER — Telehealth: Payer: Self-pay

## 2024-06-30 NOTE — Telephone Encounter (Signed)
-----   Message from [facility] Vadito H sent at 06/26/2024  3:03 PM EDT ----- Regarding: hgb due and appt in December MyChart message patient and remind her to go to the lab for Hgb  recheck this week.  Also inform patient of F/U appointment with [facility] in Dec. : Fri, Dec 26 at 11am

## 2024-06-30 NOTE — Telephone Encounter (Signed)
 MyChart message to patient to go to the lab.  Also given information regarding follow up appointment in December with Dr. Leigh

## 2024-07-02 ENCOUNTER — Telehealth (HOSPITAL_COMMUNITY): Payer: Self-pay | Admitting: *Deleted

## 2024-07-02 NOTE — Telephone Encounter (Signed)
 Called and left message for patient to please go to the lab at her earliest convenience for hgb. Also gave her information about her appointment in December with Dr. Leigh. Letter mailed to patient

## 2024-07-02 NOTE — Telephone Encounter (Signed)
 Pt recently hospitalized for GI bleed and meds stopped at d/c: Will hold torsemide  for now due to hypotension. She follows with Dr. Bensimhon. Previously she was on Coreg , Entresto , spironolactone , Jardiance , Imdur  at home.  Called pt to f/u, she states she is feeling much better now, she denies sob/edema, doesn't feel like she is retaining any fluid at this time. She states BP has been running good at home. F/U appt sch for [facility] 10/20

## 2024-07-04 ENCOUNTER — Telehealth (HOSPITAL_COMMUNITY): Payer: Self-pay

## 2024-07-04 NOTE — Telephone Encounter (Signed)
 Called to confirm/remind patient of their appointment at the [facility] on 07/07/24 11:15.   Appointment:   [x] Confirmed  [] Left mess   [] No answer/No voice mail  [] VM Full/unable to leave message  [] Phone not in service  Patient reminded to bring all medications and/or complete list.  Confirmed patient has transportation. Gave directions, instructed to utilize valet parking.

## 2024-07-07 ENCOUNTER — Ambulatory Visit (HOSPITAL_COMMUNITY): Payer: Self-pay | Admitting: Cardiology

## 2024-07-07 ENCOUNTER — Other Ambulatory Visit (HOSPITAL_COMMUNITY): Payer: Self-pay

## 2024-07-07 ENCOUNTER — Ambulatory Visit (HOSPITAL_COMMUNITY)
Admission: RE | Admit: 2024-07-07 | Discharge: 2024-07-07 | Disposition: A | Discharge status: Home / Self Care | Source: Ambulatory Visit | Attending: Cardiology | Admitting: Cardiology

## 2024-07-07 ENCOUNTER — Encounter (HOSPITAL_COMMUNITY): Payer: Self-pay

## 2024-07-07 VITALS — BP 132/86 | HR 75 | Ht 59.0 in | Wt 99.0 lb

## 2024-07-07 DIAGNOSIS — Z7901 Long term (current) use of anticoagulants: Secondary | ICD-10-CM | POA: Diagnosis not present

## 2024-07-07 DIAGNOSIS — I340 Nonrheumatic mitral (valve) insufficiency: Secondary | ICD-10-CM

## 2024-07-07 DIAGNOSIS — Z87891 Personal history of nicotine dependence: Secondary | ICD-10-CM | POA: Diagnosis not present

## 2024-07-07 DIAGNOSIS — N1832 Chronic kidney disease, stage 3b: Secondary | ICD-10-CM | POA: Diagnosis not present

## 2024-07-07 DIAGNOSIS — K922 Gastrointestinal hemorrhage, unspecified: Secondary | ICD-10-CM | POA: Diagnosis not present

## 2024-07-07 DIAGNOSIS — I5032 Chronic diastolic (congestive) heart failure: Secondary | ICD-10-CM

## 2024-07-07 DIAGNOSIS — I2510 Atherosclerotic heart disease of native coronary artery without angina pectoris: Secondary | ICD-10-CM | POA: Insufficient documentation

## 2024-07-07 DIAGNOSIS — Z7984 Long term (current) use of oral hypoglycemic drugs: Secondary | ICD-10-CM | POA: Diagnosis not present

## 2024-07-07 DIAGNOSIS — I255 Ischemic cardiomyopathy: Secondary | ICD-10-CM | POA: Diagnosis not present

## 2024-07-07 DIAGNOSIS — I130 Hypertensive heart and chronic kidney disease with heart failure and stage 1 through stage 4 chronic kidney disease, or unspecified chronic kidney disease: Secondary | ICD-10-CM | POA: Diagnosis not present

## 2024-07-07 DIAGNOSIS — Q2730 Arteriovenous malformation, site unspecified: Secondary | ICD-10-CM | POA: Diagnosis not present

## 2024-07-07 DIAGNOSIS — I5022 Chronic systolic (congestive) heart failure: Secondary | ICD-10-CM | POA: Diagnosis present

## 2024-07-07 DIAGNOSIS — Z79899 Other long term (current) drug therapy: Secondary | ICD-10-CM | POA: Diagnosis not present

## 2024-07-07 DIAGNOSIS — D631 Anemia in chronic kidney disease: Secondary | ICD-10-CM | POA: Insufficient documentation

## 2024-07-07 DIAGNOSIS — D472 Monoclonal gammopathy: Secondary | ICD-10-CM | POA: Diagnosis not present

## 2024-07-07 DIAGNOSIS — I10 Essential (primary) hypertension: Secondary | ICD-10-CM

## 2024-07-07 LAB — BASIC METABOLIC PANEL WITH GFR
Anion gap: 10 (ref 5–15)
BUN: 16 mg/dL (ref 8–23)
CO2: 20 mmol/L — ABNORMAL LOW (ref 22–32)
Calcium: 8.9 mg/dL (ref 8.9–10.3)
Chloride: 107 mmol/L (ref 98–111)
Creatinine, Ser: 1.53 mg/dL — ABNORMAL HIGH (ref 0.44–1.00)
GFR, Estimated: 34 mL/min — ABNORMAL LOW (ref >60)
Glucose, Bld: 93 mg/dL (ref 70–99)
Potassium: 5 mmol/L (ref 3.5–5.1)
Sodium: 137 mmol/L (ref 135–145)

## 2024-07-07 LAB — BRAIN NATRIURETIC PEPTIDE: B Natriuretic Peptide: 712.6 pg/mL — ABNORMAL HIGH (ref 0.0–100.0)

## 2024-07-07 LAB — CBC
HCT: 35.2 % — ABNORMAL LOW (ref 36.0–46.0)
Hemoglobin: 11.3 g/dL — ABNORMAL LOW (ref 12.0–15.0)
MCH: 29.2 pg (ref 26.0–34.0)
MCHC: 32.1 g/dL (ref 30.0–36.0)
MCV: 91 fL (ref 80.0–100.0)
Platelets: 341 K/uL (ref 150–400)
RBC: 3.87 MIL/uL (ref 3.87–5.11)
RDW: 13.7 % (ref 11.5–15.5)
WBC: 7.6 K/uL (ref 4.0–10.5)
nRBC: 0 % (ref 0.0–0.2)

## 2024-07-07 LAB — IRON AND TIBC
Iron: 38 ug/dL (ref 28–170)
Saturation Ratios: 14 % (ref 10.4–31.8)
TIBC: 279 ug/dL (ref 250–450)
UIBC: 241 ug/dL

## 2024-07-07 LAB — FERRITIN: Ferritin: 143 ng/mL (ref 11–307)

## 2024-07-07 MED ORDER — SACUBITRIL-VALSARTAN 24-26 MG PO TABS
1.0000 | ORAL_TABLET | Freq: Two times a day (BID) | ORAL | 3 refills | Status: AC
Start: 2024-07-07 — End: ?
  Filled 2024-07-07: qty 60, 30d supply, fill #0

## 2024-07-07 MED ORDER — SPIRONOLACTONE 25 MG PO TABS
12.5000 mg | ORAL_TABLET | Freq: Every day | ORAL | 3 refills | Status: AC
  Filled 2024-07-07: qty 45, 90d supply, fill #0

## 2024-07-07 NOTE — Patient Instructions (Signed)
 Medication Changes:  RESTART SPIRONOLACTONE  12.5MG  ONCE DAILY   RESTART ENTRESTO  24/26MG  TWICE DAILY   Lab Work:  Labs done today, your results will be available in MyChart, we will contact you for abnormal readings.  Follow-Up in: 2 WEEKS AS SCHEDULED ON 10/31   AND THEN AGAIN IN 6 WEEKS AS SCHEDULED WITH APP CLINIC   At the [facility], you and your health needs are our priority. We have a designated team specialized in the treatment of Heart Failure. This Care Team includes your primary Heart Failure Specialized Cardiologist (physician), Advanced Practice Providers (APPs- Physician Assistants and Nurse Practitioners), and Pharmacist who all work together to provide you with the care you need, when you need it.   You may see any of the following providers on your designated Care Team at your next follow up:  Dr. Toribio Fuel Dr. Ezra Shuck Dr. Ria Commander Dr. Odis Brownie Greig Mosses, NP Caffie Shed, [place] [facility] [place], [place] Beckey Coe, NP [place] Lee, NP Tinnie Redman, PharmD   Please be sure to bring in all your medications bottles to every appointment.   Need to Contact Us :  If you have any questions or concerns before your next appointment please send us  a message through [place] or call our office at [phone].    TO LEAVE A MESSAGE FOR THE NURSE SELECT OPTION 2, PLEASE LEAVE A MESSAGE INCLUDING: YOUR NAME DATE OF BIRTH CALL BACK NUMBER REASON FOR CALL**this is important as we prioritize the call backs  YOU WILL RECEIVE A CALL BACK THE SAME DAY AS LONG AS YOU CALL BEFORE 4:00 PM

## 2024-07-07 NOTE — Progress Notes (Signed)
 ADVANCED HF CLINIC NOTE  PCP: Loris Elsie PARAS, PA-C HF Cardiologist: Dr. 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    Admitted to [facility] 12/13 for new onset acute systolic HF. Echo showed EF 25-30% with diffuse hypokinesis. Mild MR.   Admitted to [facility] 6/14 for near syncope after taking hydralazine  early and jumping up quickly. Work up negative. Echo showed EF improved to 40-45%, Severe hypokinesis base inferolateral. Akinesis base/mid inferior wall. 30 day Event monitor placed 03/11/13, showing no significant arrhythmias.  Last seen 03/2022.  Admitted 3/25 with a/c HF. Echo showed EF 40% with moderate to severe MR. Diuresed with IV lasix . Discharged home, weight 100 lbs.  Admitted 9/25 with A/C HFrEF. Diuresed with IV lasix  but eventually SCr began to rise and she was hypotensive. Echo showed EF 40-45%, LV with RWMA, GIIDD, RV normal, LA severely dilated, significant splay noted posteriorly, likely from tethered posterior leaflet given inferior wall infarct. Severe MR. Elected to not have any more invasive procedures and declined RHC and she was not interested in mitra-clip. Palliative care consult recommended at discharge, plan for OP f/u. HF meds discontinued 2/2 hypotension and elevated renal function.   She then developed GI bleed (hgb 4 s/p 2 u RBCs) and was re-admitted 06/22/24. EGD/colonoscopy without acute bleeding, suspect AVMs or diverticular bleed. GDMT held.   Today she returns for post hospital follow up with her son. Overall feeling well, NYHA I. Denies palpitations, CP, dizziness, edema, or PND/Orthopnea. No SOB. Feels tired, however attributes to deconditioning from hospitalization. Appetite ok. No fever or chills. Weight at home slightly down, 98lbs. Taking all medications. Denies ETOH, tobacco or drug use.    Cardiac Studies  - Echo 9/25: EF 40-45%, LV with RWMA, GIIDD, RV normal, LA severely dilated, significant splay noted posteriorly, likely from tethered posterior leaflet given inferior wall infarct. Severe MR. - Echo 3/25: EF 40%, moderate to severe MR - Echo 2/22: EF 30-35%  large aneurysm at the base to mid inferior wall. Mild to moderate MR.  - Echo 3/18: EF 30-35% large aneurysm at the base to mid inferior wall. - Echo 5/19: EF 35% large aneurysm at the base to mid inferior wall. - Cath 12/13: Left main: Normal  LAD: Gives off 3 diagonals. Mild diffuse plaquing with 30-40% lesion in midsection. Otherwise normal.  LCX: Tiny ramus. Large OM-1 & OM-2. 30% lesion in ostial LCX. Otherwise mild plaque.  RCA: Dominant vessel with diffuse disease. Diffuse 30-40% through midsection. In distal RCA there is a ruptured plaque with about 60% stenosis. PDA moderate diffuse disease. In distal RCA just prior to take-off of PLs there is a very focal area of myocardial bridging with severe systolic compression.  LV-gram done in the RAO projection: Ejection fraction = 25-30%. There is a large aneurysm at the base to mid inferior wall. Anterior wall mild HK. No significant MR noted.  Review of systems complete and found to be negative unless listed in HPI.    Past Medical History:  Diagnosis Date   Acute systolic heart failure (HCC) 09/09/2012   Anemia    Arthritis    CAD (coronary artery disease) 09/11/2012   Essential hypertension 10/04/2012   Ischemic cardiomyopathy    NSTEMI (non-ST elevated myocardial infarction) (HCC) 09/11/2012   Current Outpatient Medications  Medication Sig Dispense Refill   apixaban  (  ELIQUIS ) 2.5 MG TABS tablet Take 1 tablet (2.5 mg total) by mouth 2 (two) times daily. 60 tablet 3   atorvastatin  (LIPITOR ) 80 MG tablet TAKE 1 TABLET BY MOUTH DAILY AT 6PM 90 tablet 1   empagliflozin  (JARDIANCE ) 10 MG TABS tablet Take 1 tablet (10 mg total) by mouth daily. 90 tablet 3    ferrous sulfate 325 (65 FE) MG tablet Take 1 tablet (325 mg total) by mouth daily after breakfast. 30 tablet 0   TYLENOL  500 MG tablet Take 500-1,000 mg by mouth every 6 (six) hours as needed for mild pain or headache.     No current facility-administered medications for this visit.   Allergies  Allergen Reactions   Vicodin [Hydrocodone-Acetaminophen ] Nausea And Vomiting    Wt Readings from Last 3 Encounters:  06/22/24 45.3 kg (99 lb 14.4 oz)  06/17/24 45.2 kg (99 lb 9.6 oz)  06/06/24 46.7 kg (103 lb)   There were no vitals taken for this visit.  PHYSICAL EXAM: General: Young for age appearing. No distress on RA Cardiac: JVP flat. S1 and S2 present. 4/6 systolic murmur Resp: Lung sounds clear and equal B/L Extremities: Warm and dry.  No peripheral edema.  Neuro: Alert and oriented x3. Affect pleasant. Moves all extremities without difficulty.   Assessment/Plan 1. Chronic systolic HF:  - Ischemic cardiomyopathy. 2019:  EF ~35%  She has a large inferior wall aneurysm.  - Echo 2/22: EF 30-35%  large aneurysm at the base to mid inferior wall. Mild to moderate MR.  - Echo 3/25 EF ~40% with moderate MR. - Echo 9/25 EF 40-45%, LV with RWMA, GIIDD, RV normal, LA severely dilated, significant splay noted posteriorly, likely from tethered posterior leaflet given inferior wall infarct. Severe MR. - NYHA I-II. Appears euvolemic on exam. - has PRN Lasix  40 mg, has not needed discussed weight gain and s/s volume - continue jardiance  10 mg daily - restart entresto  24/26 mg bid, BMET/BNP today, repeat BMET in 10 days - restart spiro 12.5 mg daily, watch K - taken off of GDMT during last admission, slowly add back  2. MR - mild to mod on echo 2/22 - moderate to severe on echo 3/25 - severe on echo 9/25 - suspect that this is the driver of her symptoms, as the EF remains unchanged. Currently asymptomatic. - discussed again today, will hold off on referral for mTEER as she is asymptomatic and  not interested at this time.   3. CAD  - No chest pain - No ASA with AC - Continue atorvastatin  + zetia    4. Anemia - multifactorial: iron  deficiency d/t CKD + blood loss - baseline hgb 7-8 - recent admission for GIB, hgb 4 s/p transfusions. EDG/colonoscope w/o acute bleed, ?AVM - on eliquis , no bleeding. Check CBC, anemia panel   5. CKD 3b - baseline SCr 1.5; last Cr 1.47 - Continue SGLT2i - BMET today  6. LV aneurysm:   - Felt to be high-risk for embolic phenomenon  - Previously on coumadin , now on Eliquis  2.5 mg bid (reduced dose for age and weight) - No bleeding.    7. HTN - Stable.  - restart GDMT as above  8. MGUS - Followed by Dr Fernand.    Follow up in 2 weeks with APP (GDMT titration, favor slow titration given age) Follow up in 6 weeks with APP Follow up in 4 months with Dr. Bensimhon  [place] Delyle Weider, NP 07/07/24

## 2024-07-17 ENCOUNTER — Telehealth (HOSPITAL_COMMUNITY): Payer: Self-pay

## 2024-07-17 NOTE — Telephone Encounter (Signed)
 Called to confirm/remind patient of their appointment at the [facility] on 07/18/24.   Appointment:   [x] Confirmed  [] Left mess   [] No answer/No voice mail  [] VM Full/unable to leave message  [] Phone not in service  Patient reminded to bring all medications and/or complete list.  Confirmed patient has transportation. Gave directions, instructed to utilize valet parking.

## 2024-07-18 ENCOUNTER — Ambulatory Visit (HOSPITAL_COMMUNITY): Payer: Self-pay | Admitting: Physician Assistant

## 2024-07-18 ENCOUNTER — Ambulatory Visit (HOSPITAL_COMMUNITY)
Admission: RE | Admit: 2024-07-18 | Discharge: 2024-07-18 | Disposition: A | Discharge status: Home / Self Care | Source: Ambulatory Visit | Attending: Physician Assistant | Admitting: Physician Assistant

## 2024-07-18 VITALS — BP 132/82 | HR 88 | Wt 95.2 lb

## 2024-07-18 DIAGNOSIS — I340 Nonrheumatic mitral (valve) insufficiency: Secondary | ICD-10-CM | POA: Insufficient documentation

## 2024-07-18 DIAGNOSIS — N1831 Chronic kidney disease, stage 3a: Secondary | ICD-10-CM | POA: Diagnosis not present

## 2024-07-18 DIAGNOSIS — Z87891 Personal history of nicotine dependence: Secondary | ICD-10-CM | POA: Diagnosis not present

## 2024-07-18 DIAGNOSIS — D472 Monoclonal gammopathy: Secondary | ICD-10-CM | POA: Diagnosis not present

## 2024-07-18 DIAGNOSIS — I5022 Chronic systolic (congestive) heart failure: Secondary | ICD-10-CM | POA: Insufficient documentation

## 2024-07-18 DIAGNOSIS — D631 Anemia in chronic kidney disease: Secondary | ICD-10-CM | POA: Insufficient documentation

## 2024-07-18 DIAGNOSIS — I253 Aneurysm of heart: Secondary | ICD-10-CM

## 2024-07-18 DIAGNOSIS — I255 Ischemic cardiomyopathy: Secondary | ICD-10-CM | POA: Diagnosis not present

## 2024-07-18 DIAGNOSIS — I2510 Atherosclerotic heart disease of native coronary artery without angina pectoris: Secondary | ICD-10-CM | POA: Diagnosis not present

## 2024-07-18 DIAGNOSIS — I10 Essential (primary) hypertension: Secondary | ICD-10-CM

## 2024-07-18 DIAGNOSIS — Z79899 Other long term (current) drug therapy: Secondary | ICD-10-CM | POA: Insufficient documentation

## 2024-07-18 DIAGNOSIS — I110 Hypertensive heart disease with heart failure: Secondary | ICD-10-CM | POA: Diagnosis not present

## 2024-07-18 DIAGNOSIS — Z7984 Long term (current) use of oral hypoglycemic drugs: Secondary | ICD-10-CM | POA: Insufficient documentation

## 2024-07-18 LAB — BASIC METABOLIC PANEL WITH GFR
Anion gap: 12 (ref 5–15)
BUN: 30 mg/dL — ABNORMAL HIGH (ref 8–23)
CO2: 19 mmol/L — ABNORMAL LOW (ref 22–32)
Calcium: 8.6 mg/dL — ABNORMAL LOW (ref 8.9–10.3)
Chloride: 104 mmol/L (ref 98–111)
Creatinine, Ser: 2.01 mg/dL — ABNORMAL HIGH (ref 0.44–1.00)
GFR, Estimated: 24 mL/min — ABNORMAL LOW (ref >60)
Glucose, Bld: 89 mg/dL (ref 70–99)
Potassium: 5.1 mmol/L (ref 3.5–5.1)
Sodium: 135 mmol/L (ref 135–145)

## 2024-07-18 LAB — BRAIN NATRIURETIC PEPTIDE: B Natriuretic Peptide: 394.7 pg/mL — ABNORMAL HIGH (ref 0.0–100.0)

## 2024-07-18 MED ORDER — CARVEDILOL 3.125 MG PO TABS: 3.1250 mg | ORAL_TABLET | Freq: Two times a day (BID) | ORAL | 3 refills | Status: AC

## 2024-07-18 NOTE — Patient Instructions (Signed)
 Medication Changes:  START Carvedilol  3.125 mg Twice daily   Lab Work:  Labs done today, your results will be available in MyChart, we will contact you for abnormal readings.  Special Instructions // Education:  Do the following things EVERYDAY: Weigh yourself in the morning before breakfast. Write it down and keep it in a log. Take your medicines as prescribed Eat low salt foods--Limit salt (sodium) to 2000 mg per day.  Stay as active as you can everyday Limit all fluids for the day to less than 2 liters   Follow-Up in: AS SCHEDULED Monday 08/18/24 at 10:30 AM   At the [facility], you and your health needs are our priority. We have a designated team specialized in the treatment of Heart Failure. This Care Team includes your primary Heart Failure Specialized Cardiologist (physician), Advanced Practice Providers (APPs- Physician Assistants and Nurse Practitioners), and Pharmacist who all work together to provide you with the care you need, when you need it.   You may see any of the following providers on your designated Care Team at your next follow up:  Dr. Toribio Fuel Dr. Ezra Shuck Dr. Odis Brownie Greig Mosses, NP Caffie Shed, [place] [facility] [place], [place] Beckey Coe, NP [place] Lee, NP Tinnie Redman, PharmD   Please be sure to bring in all your medications bottles to every appointment.   Need to Contact Us :  If you have any questions or concerns before your next appointment please send us  a message through [place] or call our office at [phone].    TO LEAVE A MESSAGE FOR THE NURSE SELECT OPTION 2, PLEASE LEAVE A MESSAGE INCLUDING: YOUR NAME DATE OF BIRTH CALL BACK NUMBER REASON FOR CALL**this is important as we prioritize the call backs  YOU WILL RECEIVE A CALL BACK THE SAME DAY AS LONG AS YOU CALL BEFORE 4:00 PM

## 2024-07-18 NOTE — Telephone Encounter (Signed)
 Kidney function is worse. May be dehydrated. Would have her increase fluid intake. Watch potassium in diet. Repeat BMET in 2 weeks.

## 2024-07-18 NOTE — Progress Notes (Signed)
 ADVANCED HEART FAILURE CLINIC NOTE  Patient ID: Faith Gray, female   DOB: [birth year], 83 y.o.   MRN: [id number]  PCP: Loris Elsie JINNY DEVONNA HF Cardiologist: Dr. 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    Admitted to [facility] 12/13 for new onset acute systolic HF. Echo showed EF 25-30% with diffuse hypokinesis. Mild MR.   Admitted to [facility] 6/14 for near syncope after taking hydralazine  early and jumping up quickly. Work up negative. Echo showed EF improved to 40-45%, Severe hypokinesis base inferolateral. Akinesis base/mid inferior wall. 30 day Event monitor placed 03/11/13, showing no significant arrhythmias.  Last seen 03/2022.  Admitted 3/25 with a/c HF. Echo showed EF 40% with moderate to severe MR. Diuresed with IV lasix . Discharged home, weight 100 lbs.  Admitted 9/25 with A/C HFrEF. Diuresed with IV lasix  but eventually SCr began to rise and she was hypotensive. Echo showed EF 40-45%, LV with RWMA, GIIDD, RV normal, LA severely dilated, significant splay noted posteriorly, likely from tethered posterior leaflet given inferior wall infarct. Severe MR. Elected to not have any more invasive procedures and declined RHC and she was not interested in mitra-clip. Palliative care consult recommended at discharge, plan for OP f/u. HF meds discontinued 2/2 hypotension and elevated renal function.    She then developed GI bleed (hgb 4 s/p 2 u RBCs) and was re-admitted 06/22/24. EGD/colonoscopy without acute bleeding, suspect AVMs or diverticular bleed. GDMT held.   Seen for follow-up 10/20. Restarted on Entresto  and Spiro.  Here today for CHF follow-up/medication titration.She has been stable from HF standpoint. No chest pain, shortness of breath or palpitations. Main limitation is fatigue but still independent in ADLs. No orthopnea,  PND or lower extremity edema. Weight is down a few lbs. Has never had a big appetite. Drinking ensures. No dizziness, presyncope or syncope. Taking all medications. No bleeding issues. Home BP averages around 130/80.   Cardiac Studies  - Echo 9/25 EF 40-45%, LV with RWMA, GIIDD, RV normal, LA severely dilated, significant splay noted posteriorly, likely from tethered posterior leaflet given inferior wall infarct. Severe MR. - Echo 3/25: EF 40%, moderate to severe MR - Echo 11/01/20: EF 30-35%  large aneurysm at the base to mid inferior wall. Mild to moderate MR.  - Echo 3/18: EF 30-35% large aneurysm at the base to mid inferior wall. - Echo 02/01/18: EF 35% large aneurysm at the base to mid inferior wall.  - Cath 08/2012:  Left main: Normal  LAD: Gives off 3 diagonals. Mild diffuse plaquing with 30-40% lesion in midsection. Otherwise normal.  LCX: Tiny ramus. Large OM-1 & OM-2. 30% lesion in ostial LCX. Otherwise mild plaque.  RCA: Dominant vessel with diffuse disease. Diffuse 30-40% through midsection. In distal RCA there is a ruptured plaque with about 60% stenosis. PDA moderate diffuse disease. In distal RCA just prior to take-off of PLs there is a very focal area of myocardial bridging with severe systolic compression.  LV-gram done in the RAO projection: Ejection fraction = 25-30%. There is a large aneurysm at the base to mid inferior wall. Anterior wall mild HK. No significant MR noted.  Review of systems complete and found to be negative unless listed in HPI.    Past Medical History:  Diagnosis Date   Acute systolic heart failure (HCC) 09/09/2012   Anemia  Arthritis    CAD (coronary artery disease) 09/11/2012   Essential hypertension 10/04/2012   Ischemic cardiomyopathy    NSTEMI (non-ST elevated myocardial infarction) (HCC) 09/11/2012   Current Outpatient Medications  Medication Sig Dispense Refill   apixaban  (ELIQUIS ) 2.5 MG TABS tablet Take 1 tablet (2.5 mg total) by mouth 2  (two) times daily. 60 tablet 3   atorvastatin  (LIPITOR ) 80 MG tablet TAKE 1 TABLET BY MOUTH DAILY AT 6PM 90 tablet 1   empagliflozin  (JARDIANCE ) 10 MG TABS tablet Take 1 tablet (10 mg total) by mouth daily. 90 tablet 3   ferrous sulfate 325 (65 FE) MG tablet Take 1 tablet (325 mg total) by mouth daily after breakfast. 30 tablet 0   sacubitril -valsartan  (ENTRESTO ) 24-26 MG Take 1 tablet by mouth 2 (two) times daily. 60 tablet 3   spironolactone  (ALDACTONE ) 25 MG tablet Take 1/2 tablets (12.5 mg total) by mouth daily. 45 tablet 3   TYLENOL  500 MG tablet Take 500-1,000 mg by mouth every 6 (six) hours as needed for mild pain or headache.     No current facility-administered medications for this encounter.   Allergies  Allergen Reactions   Vicodin [Hydrocodone-Acetaminophen ] Nausea And Vomiting    Wt Readings from Last 3 Encounters:  07/18/24 43.2 kg (95 lb 3.2 oz)  07/07/24 44.9 kg (99 lb)  06/22/24 45.3 kg (99 lb 14.4 oz)   BP 132/82   Pulse 88   Wt 43.2 kg (95 lb 3.2 oz)   SpO2 100%   BMI 19.23 kg/m   PHYSICAL EXAM: General:  Thin elderly female. Looks younger than stated age. No distress. Cor: No JVD. Regular rhythm. 4/6 holosystolic murmur. Lungs: clear Abdomen: soft, nontender, nondistended. Extremities: no edema Neuro: alert & orientedx3. Affect pleasant    Assessment/Plan 1. Chronic systolic HF:  - Ischemic cardiomyopathy. 2019:  EF ~35%  She has a large inferior wall aneurysm.  - Echo 2/22: EF 30-35%  large aneurysm at the base to mid inferior wall. Mild to moderate MR.  - Echo 3/25 EF ~40% with moderate MR. - Echo 9/25 EF 40-45%, LV with RWMA, GIIDD, RV normal, LA severely dilated, significant splay noted posteriorly, likely from tethered posterior leaflet given inferior wall infarct. Severe MR. - NYHA II. Volume looks good. - has PRN Lasix  40 mg, has not needed - continue jardiance  10 mg daily - continue entresto  24/26 mg bid - continue spiro 12.5 mg daily, watch  k - taken off of GDMT during last admission, slowly add back given advanced age - BMET/BNP today   2. MR - mild to mod on echo 2/22 - moderate to severe on echo 3/25 - severe on echo 9/25 - suspect that this is the driver of her symptoms, as the EF remains unchanged - MitraClip discussed during last admission, she was not interested.   3. CAD  - No chest pain - No ASA with AC - Continue atorvastatin  + zetia    4. Anemia - baseline hgb 7-8, likely related to CKD - Recent GI bleed - CBC from 10/20 reviewed, Hgb improved to 11.3   5. CKD 3a - baseline SCr 1.5.  - Continue SGLT2i - Labs today  6. LV aneurysm:   - Felt to be high-risk for embolic phenomenon  - Previously on coumadin , now on Eliquis  2.5 mg bid (reduced dose for age and weight) - Denies any bleeding since last hospitalization   7. HTN - BP slightly above goal - Meds as above  8. MGUS -  Followed by Dr Fernand.    Follow up as scheduled in 12/25 for medication titration then can space out follow-ups.  Corinthia Helmers N PA-C 07/18/24

## 2024-08-01 ENCOUNTER — Ambulatory Visit (HOSPITAL_COMMUNITY)

## 2024-08-04 ENCOUNTER — Other Ambulatory Visit (HOSPITAL_COMMUNITY): Payer: Self-pay

## 2024-08-12 NOTE — Progress Notes (Incomplete)
 ADVANCED HEART FAILURE CLINIC NOTE  Patient ID: Faith Gray, female   DOB: [birth year], 83 y.o.   MRN: [id number]  PCP: Loris Elsie [facility]IN[facility]ON[facility] Cardiologist: Dr. Cherrie  HPI: Ms. 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    Admitted to [facility] 12/13 for new onset acute systolic HF. Echo showed EF 25-30% with diffuse hypokinesis. Mild MR.   Admitted to [facility] 6/14 for near syncope after taking hydralazine  early and jumping up quickly. Work up negative. Echo showed EF improved to 40-45%, Severe hypokinesis base inferolateral. Akinesis base/mid inferior wall. 30 day Event monitor placed 03/11/13, showing no significant arrhythmias.  Last seen 03/2022.  Admitted 3/25 with a/c HF. Echo showed EF 40% with moderate to severe MR. Diuresed with IV lasix . Discharged home, weight 100 lbs.  Admitted 9/25 with A/C HFrEF. Diuresed with IV lasix  but eventually SCr began to rise and she was hypotensive. Echo showed EF 40-45%, LV with RWMA, GIIDD, RV normal, LA severely dilated, significant splay noted posteriorly, likely from tethered posterior leaflet given inferior wall infarct. Severe MR. Elected to not have any more invasive procedures and declined RHC and she was not interested in mitra-clip. Palliative care consult recommended at discharge, plan for OP f/u. HF meds discontinued 2/2 hypotension and elevated renal function.    She then developed GI bleed (hgb 4 s/p 2 u RBCs) and was re-admitted 06/22/24. EGD/colonoscopy without acute bleeding, suspect AVMs or diverticular bleed. GDMT held.   Seen for follow-up 10/20. Restarted on Entresto  and Spiro.  Here today for CHF follow-up/medication titration.She has been stable from HF standpoint. No chest pain, shortness of breath or palpitations. Main limitation is fatigue but still independent in ADLs. No orthopnea,  PND or lower extremity edema. Weight is down a few lbs. Has never had a big appetite. Drinking ensures. No dizziness, presyncope or syncope. Taking all medications. No bleeding issues. Home BP averages around 130/80.   Cardiac Studies  - Echo 9/25 EF 40-45%, LV with RWMA, GIIDD, RV normal, LA severely dilated, significant splay noted posteriorly, likely from tethered posterior leaflet given inferior wall infarct. Severe MR. - Echo 3/25: EF 40%, moderate to severe MR - Echo 11/01/20: EF 30-35%  large aneurysm at the base to mid inferior wall. Mild to moderate MR.  - Echo 3/18: EF 30-35% large aneurysm at the base to mid inferior wall. - Echo 02/01/18: EF 35% large aneurysm at the base to mid inferior wall.  - Cath 08/2012:  Left main: Normal  LAD: Gives off 3 diagonals. Mild diffuse plaquing with 30-40% lesion in midsection. Otherwise normal.  LCX: Tiny ramus. Large OM-1 & OM-2. 30% lesion in ostial LCX. Otherwise mild plaque.  RCA: Dominant vessel with diffuse disease. Diffuse 30-40% through midsection. In distal RCA there is a ruptured plaque with about 60% stenosis. PDA moderate diffuse disease. In distal RCA just prior to take-off of PLs there is a very focal area of myocardial bridging with severe systolic compression.  LV-gram done in the RAO projection: Ejection fraction = 25-30%. There is a large aneurysm at the base to mid inferior wall. Anterior wall mild HK. No significant MR noted.  Review of systems complete and found to be negative unless listed in HPI.    Past Medical History:  Diagnosis Date   Acute systolic heart failure (HCC) 09/09/2012   Anemia  Arthritis    CAD (coronary artery disease) 09/11/2012   Essential hypertension 10/04/2012   Ischemic cardiomyopathy    NSTEMI (non-ST elevated myocardial infarction) (HCC) 09/11/2012   Current Outpatient Medications  Medication Sig Dispense Refill   apixaban  (ELIQUIS ) 2.5 MG TABS tablet Take 1 tablet (2.5 mg total) by mouth 2  (two) times daily. 60 tablet 3   atorvastatin  (LIPITOR ) 80 MG tablet TAKE 1 TABLET BY MOUTH DAILY AT 6PM 90 tablet 1   carvedilol  (COREG ) 3.125 MG tablet Take 1 tablet (3.125 mg total) by mouth 2 (two) times daily. 180 tablet 3   empagliflozin  (JARDIANCE ) 10 MG TABS tablet Take 1 tablet (10 mg total) by mouth daily. 90 tablet 3   ferrous sulfate  325 (65 FE) MG tablet Take 1 tablet (325 mg total) by mouth daily after breakfast. 30 tablet 0   sacubitril -valsartan  (ENTRESTO ) 24-26 MG Take 1 tablet by mouth 2 (two) times daily. 60 tablet 3   spironolactone  (ALDACTONE ) 25 MG tablet Take 1/2 tablets (12.5 mg total) by mouth daily. 45 tablet 3   TYLENOL  500 MG tablet Take 500-1,000 mg by mouth every 6 (six) hours as needed for mild pain or headache.     No current facility-administered medications for this visit.   Allergies  Allergen Reactions   Vicodin [Hydrocodone-Acetaminophen ] Nausea And Vomiting    Wt Readings from Last 3 Encounters:  07/18/24 43.2 kg (95 lb 3.2 oz)  07/07/24 44.9 kg (99 lb)  06/22/24 45.3 kg (99 lb 14.4 oz)   There were no vitals taken for this visit.  PHYSICAL EXAM: General:  Thin elderly female. Looks younger than stated age. No distress. Cor: No JVD. Regular rhythm. 4/6 holosystolic murmur. Lungs: clear Abdomen: soft, nontender, nondistended. Extremities: no edema Neuro: alert & orientedx3. Affect pleasant    Assessment/Plan 1. Chronic systolic HF:  - Ischemic cardiomyopathy. 2019:  EF ~35%  She has a large inferior wall aneurysm.  - Echo 2/22: EF 30-35%  large aneurysm at the base to mid inferior wall. Mild to moderate MR.  - Echo 3/25 EF ~40% with moderate MR. - Echo 9/25 EF 40-45%, LV with RWMA, GIIDD, RV normal, LA severely dilated, significant splay noted posteriorly, likely from tethered posterior leaflet given inferior wall infarct. Severe MR. - NYHA II. Volume looks good. - has PRN Lasix  40 mg, has not needed - continue jardiance  10 mg daily -  continue entresto  24/26 mg bid - continue spiro 12.5 mg daily, watch k - taken off of GDMT during last admission, slowly add back given advanced age - BMET/BNP today   2. MR - mild to mod on echo 2/22 - moderate to severe on echo 3/25 - severe on echo 9/25 - suspect that this is the driver of her symptoms, as the EF remains unchanged - MitraClip discussed during last admission, she was not interested.   3. CAD  - No chest pain - No ASA with AC - Continue atorvastatin  + zetia    4. Anemia - baseline hgb 7-8, likely related to CKD - Recent GI bleed - CBC from 10/20 reviewed, Hgb improved to 11.3   5. CKD 3a - baseline SCr 1.5.  - Continue SGLT2i - Labs today  6. LV aneurysm:   - Felt to be high-risk for embolic phenomenon  - Previously on coumadin , now on Eliquis  2.5 mg bid (reduced dose for age and weight) - Denies any bleeding since last hospitalization   7. HTN - BP slightly above goal - Meds as  above  8. MGUS - Followed by Dr Fernand.    Follow up as scheduled in 12/25 for medication titration then can space out follow-ups.  Reginal Wojcicki M Lylla Eifler PA-C 08/12/24

## 2024-08-13 ENCOUNTER — Telehealth (HOSPITAL_COMMUNITY): Payer: Self-pay

## 2024-08-13 NOTE — Telephone Encounter (Signed)
 Called to confirm/remind patient of their appointment at the [facility] on 08/18/24.   Appointment:   [x] Confirmed  [] Left mess   [] No answer/No voice mail  [] VM Full/unable to leave message  [] Phone not in service  Patient reminded to bring all medications and/or complete list.  Confirmed patient has transportation. Gave directions, instructed to utilize valet parking.

## 2024-08-18 ENCOUNTER — Ambulatory Visit (HOSPITAL_COMMUNITY)

## 2024-08-29 ENCOUNTER — Ambulatory Visit (HOSPITAL_COMMUNITY)

## 2024-08-29 NOTE — Progress Notes (Incomplete)
 ADVANCED HEART FAILURE CLINIC NOTE  Patient ID: Darria Corvera, female   DOB: Dec 13, 1940, 83 y.o.   MRN: [id number]  PCP: Loris Elsie JINNY [facility]ONNA HF Cardiologist: Dr. 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    Admitted to [facility] 12/13 for new onset acute systolic HF. Echo showed EF 25-30% with diffuse hypokinesis. Mild MR.   Admitted to [facility] 6/14 for near syncope after taking hydralazine  early and jumping up quickly. Work up negative. Echo showed EF improved to 40-45%, Severe hypokinesis base inferolateral. Akinesis base/mid inferior wall. 30 day Event monitor placed 03/11/13, showing no significant arrhythmias.  Admitted 3/25 with a/c HF. Echo showed EF 40% with moderate to severe MR. Diuresed with IV lasix . Discharged home, weight 100 lbs.  Admitted 9/25 with A/C HFrEF. Diuresed with IV lasix  but eventually SCr began to rise and she was hypotensive. Echo showed EF 40-45%, LV with RWMA, GIIDD, RV normal, LA severely dilated, significant splay noted posteriorly, likely from tethered posterior leaflet given inferior wall infarct. Severe MR. Elected to not have any more invasive procedures and declined RHC and she was not interested in mitra-clip. Palliative care consult recommended at discharge, plan for OP f/u. HF meds discontinued 2/2 hypotension and elevated renal function.   She then developed GI bleed (hgb 4 s/p 2 u RBCs) and was re-admitted 06/22/24. EGD/colonoscopy without acute bleeding, suspect AVMs or diverticular bleed. GDMT held.   Seen for follow-up 10/20. Restarted on Entresto  and Spiro.  Here today for CHF follow-up/medication titration.She has been stable from HF standpoint. No chest pain, shortness of breath or palpitations. Main limitation is fatigue but still independent in ADLs. No orthopnea, PND or lower extremity  edema. Weight is down a few lbs. Has never had a big appetite. Drinking ensures. No dizziness, presyncope or syncope. Taking all medications. No bleeding issues. Home BP averages around 130/80.   Cardiac Studies  - Echo 9/25 EF 40-45%, LV with RWMA, GIIDD, RV normal, LA severely dilated, significant splay noted posteriorly, likely from tethered posterior leaflet given inferior wall infarct. Severe MR. - Echo 3/25: EF 40%, moderate to severe MR - Echo 11/01/20: EF 30-35%  large aneurysm at the base to mid inferior wall. Mild to moderate MR.  - Echo 3/18: EF 30-35% large aneurysm at the base to mid inferior wall. - Echo 02/01/18: EF 35% large aneurysm at the base to mid inferior wall.  - Cath 08/2012:  Left main: Normal  LAD: Gives off 3 diagonals. Mild diffuse plaquing with 30-40% lesion in midsection. Otherwise normal.  LCX: Tiny ramus. Large OM-1 & OM-2. 30% lesion in ostial LCX. Otherwise mild plaque.  RCA: Dominant vessel with diffuse disease. Diffuse 30-40% through midsection. In distal RCA there is a ruptured plaque with about 60% stenosis. PDA moderate diffuse disease. In distal RCA just prior to take-off of PLs there is a very focal area of myocardial bridging with severe systolic compression.  LV-gram done in the RAO projection: Ejection fraction = 25-30%. There is a large aneurysm at the base to mid inferior wall. Anterior wall mild HK. No significant MR noted.  Review of systems complete and found to be negative unless listed in HPI.    Past Medical History:  Diagnosis Date   Acute systolic heart failure (HCC) 09/09/2012   Anemia    Arthritis  CAD (coronary artery disease) 09/11/2012   Essential hypertension 10/04/2012   Ischemic cardiomyopathy    NSTEMI (non-ST elevated myocardial infarction) (HCC) 09/11/2012   Current Outpatient Medications  Medication Sig Dispense Refill   apixaban  (ELIQUIS ) 2.5 MG TABS tablet Take 1 tablet (2.5 mg total) by mouth 2 (two) times daily. 60  tablet 3   atorvastatin  (LIPITOR ) 80 MG tablet TAKE 1 TABLET BY MOUTH DAILY AT 6PM 90 tablet 1   carvedilol  (COREG ) 3.125 MG tablet Take 1 tablet (3.125 mg total) by mouth 2 (two) times daily. 180 tablet 3   empagliflozin  (JARDIANCE ) 10 MG TABS tablet Take 1 tablet (10 mg total) by mouth daily. 90 tablet 3   ferrous sulfate  325 (65 FE) MG tablet Take 1 tablet (325 mg total) by mouth daily after breakfast. 30 tablet 0   sacubitril -valsartan  (ENTRESTO ) 24-26 MG Take 1 tablet by mouth 2 (two) times daily. 60 tablet 3   spironolactone  (ALDACTONE ) 25 MG tablet Take 1/2 tablets (12.5 mg total) by mouth daily. 45 tablet 3   TYLENOL  500 MG tablet Take 500-1,000 mg by mouth every 6 (six) hours as needed for mild pain or headache.     No current facility-administered medications for this visit.   Allergies  Allergen Reactions   Vicodin [Hydrocodone-Acetaminophen ] Nausea And Vomiting    Wt Readings from Last 3 Encounters:  07/18/24 43.2 kg (95 lb 3.2 oz)  07/07/24 44.9 kg (99 lb)  06/22/24 45.3 kg (99 lb 14.4 oz)   There were no vitals taken for this visit.  PHYSICAL EXAM: General:  Thin elderly female. Looks younger than stated age. No distress. Cor: No JVD. Regular rhythm. 4/6 holosystolic murmur. Lungs: clear Abdomen: soft, nontender, nondistended. Extremities: no edema Neuro: alert & orientedx3. Affect pleasant    Assessment/Plan 1. Chronic systolic HF:  - Ischemic cardiomyopathy. 2019:  EF ~35%  She has a large inferior wall aneurysm.  - Echo 2/22: EF 30-35%  large aneurysm at the base to mid inferior wall. Mild to moderate MR.  - Echo 3/25 EF ~40% with moderate MR. - Echo 9/25 EF 40-45%, LV with RWMA, GIIDD, RV normal, LA severely dilated, significant splay noted posteriorly, likely from tethered posterior leaflet given inferior wall infarct. Severe MR. - NYHA II. Volume looks good. - has PRN Lasix  40 mg, has not needed - continue jardiance  10 mg daily - continue entresto  24/26 mg  bid - continue spiro 12.5 mg daily, watch k - taken off of GDMT during last admission, slowly add back given advanced age - BMET/BNP today   2. MR - mild to mod on echo 2/22 - moderate to severe on echo 3/25 - severe on echo 9/25 - suspect that this is the driver of her symptoms, as the EF remains unchanged - MitraClip discussed during last admission, she was not interested.   3. CAD  - No chest pain - No ASA with AC - Continue atorvastatin  + zetia    4. Anemia - baseline hgb 7-8, likely related to CKD - Recent GI bleed - CBC from 10/20 reviewed, Hgb improved to 11.3   5. CKD 3a - baseline SCr 1.5.  - Continue SGLT2i - Labs today  6. LV aneurysm:   - Felt to be high-risk for embolic phenomenon  - Previously on coumadin , now on Eliquis  2.5 mg bid (reduced dose for age and weight) - Denies any bleeding since last hospitalization   7. HTN - BP slightly above goal - Meds as above  8. MGUS -  Followed by Dr Fernand.    Follow up as scheduled in 12/25 for medication titration then can space out follow-ups.  Caffie Shed PA-C 08/29/2024

## 2024-09-02 ENCOUNTER — Telehealth (HOSPITAL_COMMUNITY): Payer: Self-pay

## 2024-09-02 NOTE — Telephone Encounter (Signed)
 Called to confirm/remind patient of their appointment at the [facility] on 09/03/24.   Appointment:   [] Confirmed  [x] Left mess   [] No answer/No voice mail  [] VM Full/unable to leave message  [] Phone not in service  And to bring in all medications and/or complete list.

## 2024-09-03 ENCOUNTER — Ambulatory Visit (HOSPITAL_COMMUNITY): Admission: RE | Admit: 2024-09-03 | Discharge: 2024-09-03 | Attending: Family Medicine

## 2024-09-03 ENCOUNTER — Encounter (HOSPITAL_COMMUNITY): Payer: Self-pay

## 2024-09-03 ENCOUNTER — Ambulatory Visit (HOSPITAL_COMMUNITY): Payer: Self-pay | Admitting: Family Medicine

## 2024-09-03 VITALS — BP 118/80 | HR 62 | Wt 102.2 lb

## 2024-09-03 DIAGNOSIS — I2510 Atherosclerotic heart disease of native coronary artery without angina pectoris: Secondary | ICD-10-CM | POA: Diagnosis not present

## 2024-09-03 DIAGNOSIS — N1831 Chronic kidney disease, stage 3a: Secondary | ICD-10-CM | POA: Insufficient documentation

## 2024-09-03 DIAGNOSIS — I253 Aneurysm of heart: Secondary | ICD-10-CM | POA: Diagnosis not present

## 2024-09-03 DIAGNOSIS — D472 Monoclonal gammopathy: Secondary | ICD-10-CM | POA: Insufficient documentation

## 2024-09-03 DIAGNOSIS — I130 Hypertensive heart and chronic kidney disease with heart failure and stage 1 through stage 4 chronic kidney disease, or unspecified chronic kidney disease: Secondary | ICD-10-CM | POA: Insufficient documentation

## 2024-09-03 DIAGNOSIS — I5022 Chronic systolic (congestive) heart failure: Secondary | ICD-10-CM | POA: Diagnosis not present

## 2024-09-03 DIAGNOSIS — Z79899 Other long term (current) drug therapy: Secondary | ICD-10-CM | POA: Diagnosis not present

## 2024-09-03 DIAGNOSIS — Z7984 Long term (current) use of oral hypoglycemic drugs: Secondary | ICD-10-CM | POA: Insufficient documentation

## 2024-09-03 DIAGNOSIS — I10 Essential (primary) hypertension: Secondary | ICD-10-CM

## 2024-09-03 DIAGNOSIS — N1832 Chronic kidney disease, stage 3b: Secondary | ICD-10-CM | POA: Diagnosis not present

## 2024-09-03 DIAGNOSIS — D631 Anemia in chronic kidney disease: Secondary | ICD-10-CM

## 2024-09-03 DIAGNOSIS — I340 Nonrheumatic mitral (valve) insufficiency: Secondary | ICD-10-CM | POA: Diagnosis not present

## 2024-09-03 DIAGNOSIS — Z87891 Personal history of nicotine dependence: Secondary | ICD-10-CM | POA: Insufficient documentation

## 2024-09-03 DIAGNOSIS — D649 Anemia, unspecified: Secondary | ICD-10-CM | POA: Diagnosis not present

## 2024-09-03 DIAGNOSIS — I255 Ischemic cardiomyopathy: Secondary | ICD-10-CM | POA: Diagnosis not present

## 2024-09-03 DIAGNOSIS — K922 Gastrointestinal hemorrhage, unspecified: Secondary | ICD-10-CM | POA: Insufficient documentation

## 2024-09-03 LAB — BASIC METABOLIC PANEL WITH GFR
Anion gap: 13 (ref 5–15)
BUN: 37 mg/dL — ABNORMAL HIGH (ref 8–23)
CO2: 21 mmol/L — ABNORMAL LOW (ref 22–32)
Calcium: 10.1 mg/dL (ref 8.9–10.3)
Chloride: 103 mmol/L (ref 98–111)
Creatinine, Ser: 1.79 mg/dL — ABNORMAL HIGH (ref 0.44–1.00)
GFR, Estimated: 28 mL/min — ABNORMAL LOW (ref >60)
Glucose, Bld: 98 mg/dL (ref 70–99)
Potassium: 5.1 mmol/L (ref 3.5–5.1)
Sodium: 137 mmol/L (ref 135–145)

## 2024-09-03 LAB — PRO BRAIN NATRIURETIC PEPTIDE: Pro Brain Natriuretic Peptide: 1442 pg/mL — ABNORMAL HIGH (ref <300.0)

## 2024-09-03 NOTE — Progress Notes (Signed)
 ADVANCED HEART FAILURE CLINIC NOTE  Patient ID: Faith Gray, female   DOB: 1941-03-21, 83 y.o.   MRN: [id number]  PCP: Loris Elsie JINNY [facility]ONNA HF Cardiologist: Dr. 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    Admitted to [facility] 12/13 for new onset acute systolic HF. Echo showed EF 25-30% with diffuse hypokinesis. Mild MR.   Admitted to [facility] 6/14 for near syncope after taking hydralazine  early and jumping up quickly. Work up negative. Echo showed EF improved to 40-45%, Severe hypokinesis base inferolateral. Akinesis base/mid inferior wall. 30 day Event monitor placed 03/11/13, showing no significant arrhythmias.  Admitted 3/25 with a/c HF. Echo showed EF 40% with moderate to severe MR. Diuresed with IV lasix . Discharged home, weight 100 lbs.  Admitted 9/25 with A/C HFrEF. Diuresed with IV lasix  but eventually SCr began to rise and she was hypotensive. Echo showed EF 40-45%, LV with RWMA, GIIDD, RV normal, LA severely dilated, significant splay noted posteriorly, likely from tethered posterior leaflet given inferior wall infarct. Severe MR. Elected to not have any more invasive procedures and declined RHC and she was not interested in mitra-clip. Palliative care consult recommended at discharge, plan for OP f/u. HF meds discontinued 2/2 hypotension and elevated renal function.   She then developed GI bleed (hgb 4 s/p 2 u RBCs) and was re-admitted 06/22/24. EGD/colonoscopy without acute bleeding, suspect AVMs or diverticular bleed. GDMT held.   Today she returns for HF follow up. Overall feeling fine. Appetite improved, she has mild SOB walking up steps. Occasional lightheadedness, no falls or syncope. Daughter lives nearby and helps out. Denies palpitations, abnormal bleeding, CP, edema, or PND/Orthopnea. Appetite ok. Weight at home 100  pounds. Taking all medications. Has not needed Lasix .  Cardiac Studies  - Echo 9/25: EF 40-45%, LV with RWMA, GIIDD, RV normal, LA severely dilated, significant splay noted posteriorly, likely from tethered posterior leaflet given inferior wall infarct. Severe MR. - Echo 3/25: EF 40%, moderate to severe MR - Echo 11/01/20: EF 30-35%  large aneurysm at the base to mid inferior wall. Mild to moderate MR.  - Echo 3/18: EF 30-35% large aneurysm at the base to mid inferior wall. - Echo 02/01/18: EF 35% large aneurysm at the base to mid inferior wall.  - Cath 08/2012:  Left main: Normal  LAD: Gives off 3 diagonals. Mild diffuse plaquing with 30-40% lesion in midsection. Otherwise normal.  LCX: Tiny ramus. Large OM-1 & OM-2. 30% lesion in ostial LCX. Otherwise mild plaque.  RCA: Dominant vessel with diffuse disease. Diffuse 30-40% through midsection. In distal RCA there is a ruptured plaque with about 60% stenosis. PDA moderate diffuse disease. In distal RCA just prior to take-off of PLs there is a very focal area of myocardial bridging with severe systolic compression.  LV-gram done in the RAO projection: Ejection fraction = 25-30%. There is a large aneurysm at the base to mid inferior wall. Anterior wall mild HK. No significant MR noted.  Review of systems complete and found to be negative unless listed in HPI.    Past Medical History:  Diagnosis Date   Acute systolic heart failure (HCC) 09/09/2012   Anemia    Arthritis    CAD (coronary artery disease) 09/11/2012   Essential hypertension 10/04/2012   Ischemic cardiomyopathy    NSTEMI (non-ST elevated myocardial infarction) (HCC) 09/11/2012  Current Outpatient Medications  Medication Sig Dispense Refill   apixaban  (ELIQUIS ) 2.5 MG TABS tablet Take 1 tablet (2.5 mg total) by mouth 2 (two) times daily. 60 tablet 3   atorvastatin  (LIPITOR ) 80 MG tablet TAKE 1 TABLET BY MOUTH DAILY AT 6PM 90 tablet 1   empagliflozin  (JARDIANCE ) 10 MG TABS tablet  Take 1 tablet (10 mg total) by mouth daily. 90 tablet 3   ferrous sulfate  325 (65 FE) MG tablet Take 1 tablet (325 mg total) by mouth daily after breakfast. 30 tablet 0   sacubitril -valsartan  (ENTRESTO ) 24-26 MG Take 1 tablet by mouth 2 (two) times daily. 60 tablet 3   spironolactone  (ALDACTONE ) 25 MG tablet Take 1/2 tablets (12.5 mg total) by mouth daily. 45 tablet 3   TYLENOL  500 MG tablet Take 500-1,000 mg by mouth every 6 (six) hours as needed for mild pain or headache.     carvedilol  (COREG ) 3.125 MG tablet Take 1 tablet (3.125 mg total) by mouth 2 (two) times daily. (Patient not taking: Reported on 09/03/2024) 180 tablet 3   No current facility-administered medications for this encounter.   Allergies  Allergen Reactions   Vicodin [Hydrocodone-Acetaminophen ] Nausea And Vomiting    Wt Readings from Last 3 Encounters:  09/03/24 46.4 kg (102 lb 3.2 oz)  07/18/24 43.2 kg (95 lb 3.2 oz)  07/07/24 44.9 kg (99 lb)   BP 118/80   Pulse 62   Wt 46.4 kg (102 lb 3.2 oz)   SpO2 99%   BMI 20.64 kg/m   PHYSICAL EXAM: General:  NAD. No resp difficulty, walked into clinic, thin HEENT: Normal Neck: Supple. No JVD. Cor: Regular rate & rhythm. No rubs, gallops, 4/6 HSM Lungs: Clear Abdomen: Soft, nontender, nondistended.  Extremities: No cyanosis, clubbing, rash, edema Neuro: Alert & oriented x 3, moves all 4 extremities w/o difficulty. Affect pleasant.  Assessment/Plan 1. Chronic systolic HF:  - Ischemic cardiomyopathy. 2019:  EF ~35%  She has a large inferior wall aneurysm.  - Echo 2/22: EF 30-35%  large aneurysm at the base to mid inferior wall. Mild to moderate MR.  - Echo 3/25: EF ~40% with moderate MR. - Echo 9/25: EF 40-45%, LV with RWMA, GIIDD, RV normal, LA severely dilated, significant splay noted posteriorly, likely from tethered posterior leaflet given inferior wall infarct. Severe MR. - NYHA II. Volume looks good. - Hold off on restarting Coreg  with BP/HR & dizziness. -  Continue Lasix  40 mg PRN - Continue Jardiance  10 mg daily - Continue Entresto  24/26 mg bid - Continue spiro 12.5 mg daily, watch k - Labs today   2. MR - mild to mod on echo 2/22 - moderate to severe on echo 3/25 - severe on echo 9/25 - suspect that this is the driver of her symptoms, as the EF remains unchanged - MitraClip discussed during last admission, she was not interested.   3. CAD  - No chest pain - No ASA with AC - Continue atorvastatin  + zetia    4. Anemia - baseline hgb 7-8, likely related to CKD - Recent GI bleed - CBC from 10/20 reviewed, Hgb improved to 11.3   5. CKD 3a - baseline SCr 1.5.  - Continue SGLT2i - Labs today  6. LV aneurysm:   - Felt to be high-risk for embolic phenomenon  - Previously on coumadin , now on Eliquis  2.5 mg bid (reduced dose for age and weight) - No bleeding issues.   7. HTN - BP well-controlled - Meds as above  8.  MGUS - Followed by Dr Fernand.    Follow up in 4 months with Dr. Cherrie Raisin [facility] FNP-BC 09/03/2024

## 2024-09-03 NOTE — Patient Instructions (Addendum)
 Good to see you today!   Take lasix  40 mg as needed  Labs done today, your results will be available in MyChart, we will contact you for abnormal readings.  Your physician recommends that you schedule a follow-up appointment 4 months (April) call office in February to schedule an appointment  If you have any questions or concerns before your next appointment please send us  a message through [place] or call our office at [phone].    TO LEAVE A MESSAGE FOR THE NURSE SELECT OPTION 2, PLEASE LEAVE A MESSAGE INCLUDING: YOUR NAME DATE OF BIRTH CALL BACK NUMBER REASON FOR CALL**this is important as we prioritize the call backs  YOU WILL RECEIVE A CALL BACK THE SAME DAY AS LONG AS YOU CALL BEFORE 4:00 PM At the [facility], you and your health needs are our priority. As part of our continuing mission to provide you with exceptional heart care, we have created designated Provider Care Teams. These Care Teams include your primary Cardiologist (physician) and Advanced Practice Providers (APPs- Physician Assistants and Nurse Practitioners) who all work together to provide you with the care you need, when you need it.   You may see any of the following providers on your designated Care Team at your next follow up: Dr Toribio Fuel Dr Ezra Shuck Dr. Morene Brownie Greig Mosses, NP Caffie Shed, [place] [facility], [place] Beckey Coe, NP Jordan Lee, NP Ellouise Class, NP Tinnie Redman, PharmD Jaun Bash, PharmD   Please be sure to bring in all your medications bottles to every appointment.    Thank you for choosing [facility]

## 2024-09-12 ENCOUNTER — Ambulatory Visit: Admitting: Gastroenterology

## 2024-09-12 NOTE — Progress Notes (Deleted)
 "  HPI :  Admitted early October  83 year old female with a PMH of CHF (EF 40-45% 05/2024), CAD, history of an MI, severe MR, left ventricular aneurysm on Eliquis , and HTN admitted for complaints of hematochezia.  Her symptoms of painless hematochezia started acutely at 4 PM.  With the progressive episodes of bleeding she felt as if she was going to faint, but syncope never occurred.  The patient was transported by EMS after having 6 episodes of hematochezia.  In addition to the near syncope symptoms, she also reported fatigue and some SOB.  Blood pressure obtained by EMS was at 80/50 and she was successfully resuscitated with 500 mL of fluids.  Her BP increased to 118/60 mmHg.  Blood work in the ER showed that her HGB dropped down from 7.1 g/dL (0/69/7974) to 4 g/dL.  She is feeling better now.  The patient denies having a prior endoscopic work up.  There were no reports of NSAIDs or family history of colon cancer.   83 y/o female here with the following:   Lower GI bleed - secondary to diverticulosis or colonic AVM - s/p colonoscopy Post hemorrhagic anemia Anticoagulated - Eliquis  held (history of LV aneurysm, CHF)   Status post EGD and colonoscopy on 10/7. EGD showed no evidence of upper tract bleeding.  Colonoscopy remarkable for a friable AVM in the right colon that was cauterized with APC.  She had significant blood in stool in the colon making for challenging exam.  This was extensively lavaged and no active bleeding seen at the end of the exam.  She does have impressive left-sided diverticulosis.  I believe she either had a lower GI bleed from diverticulosis versus the AVM that was cauterized.  She did have numerous polyps in her colon, most small to medium in size.  She did have 1 semi-pedunculated lesion in the left colon that I suspect is a large hyperplastic polyp but this was biopsied to clarify. Biopsies confirm this to be hyperplastic / benign.    Doing well since colonoscopy. Eating  okay, no further bleeding. Hgb has drifted slightly in the setting of blood draws, but no evidence of rebleeding.    Discussed course with her and her grandson who was in the room. I think she is okay to go home today. Would not resume Eliquis  for another 3 days or so if possible given her significant bleeding that occurred and make sure stable before she resumes that. I recommend she come to our office for a blood draw next week to check Hgb to make sure stable, and we will coordinate outpatient follow up with her as well.  She will need colonoscopy as outpatient for removal of polyps when she has recovered from her hospitalization.     Of note, she does have a sore throat post EGD. On exam today she does have a red spot / mucosal irritation in the left posterior pharynx I suspect from suctioning per anesthesia during the procedure. This should resolve in upcoming days, provided reassurance, she can use cepecol lozenges, etc.    PLAN: - okay for discharge home today - resume Eliquis  in 3 days if she is otherwise stable - repeat Hgb next week at our office - no appointment needed, she should come early next week for a blood draw - we will coordinate outpatient follow up and discuss timing of repeat colonoscopy for polypectomy - if she has any recurrent bleeding when she resumes Eliquis  she needs to contact us   or seek re-evaluation        Past Medical History:  Diagnosis Date   Acute systolic heart failure (HCC) 09/09/2012   Anemia    Arthritis    CAD (coronary artery disease) 09/11/2012   Essential hypertension 10/04/2012   Ischemic cardiomyopathy    NSTEMI (non-ST elevated myocardial infarction) (HCC) 09/11/2012     Past Surgical History:  Procedure Laterality Date   CARDIAC CATHETERIZATION     COLONOSCOPY N/A 06/24/2024   Procedure: COLONOSCOPY;  Surgeon: Leigh Elspeth SQUIBB, MD;  Location: [facility] ENDOSCOPY;  Service: Gastroenterology;  Laterality: N/A;   ESOPHAGOGASTRODUODENOSCOPY  N/A 06/24/2024   Procedure: EGD (ESOPHAGOGASTRODUODENOSCOPY);  Surgeon: Leigh Elspeth SQUIBB, MD;  Location: [facility] ENDOSCOPY;  Service: Gastroenterology;  Laterality: N/A;   LEFT AND RIGHT HEART CATHETERIZATION WITH CORONARY ANGIOGRAM N/A 09/10/2012   Procedure: LEFT AND RIGHT HEART CATHETERIZATION WITH CORONARY ANGIOGRAM;  Surgeon: Toribio JONELLE Fuel, MD;  Location: [facility] CATH LAB;  Service: Cardiovascular;  Laterality: N/A;   MULTIPLE EXTRACTIONS WITH ALVEOLOPLASTY N/A 02/16/2017   Procedure: MULTIPLE EXTRACTION WITH ALVEOLOPLASTY;  Surgeon: Sheryle Hamilton, DDS;  Location: [facility] OR;  Service: Oral Surgery;  Laterality: N/A;   Family History  Problem Relation Age of Onset   Sickle cell anemia Brother    Social History[1] Current Outpatient Medications  Medication Sig Dispense Refill   apixaban  (ELIQUIS ) 2.5 MG TABS tablet Take 1 tablet (2.5 mg total) by mouth 2 (two) times daily. 60 tablet 3   atorvastatin  (LIPITOR ) 80 MG tablet TAKE 1 TABLET BY MOUTH DAILY AT 6PM 90 tablet 1   carvedilol  (COREG ) 3.125 MG tablet Take 1 tablet (3.125 mg total) by mouth 2 (two) times daily. (Patient not taking: Reported on 09/03/2024) 180 tablet 3   empagliflozin  (JARDIANCE ) 10 MG TABS tablet Take 1 tablet (10 mg total) by mouth daily. 90 tablet 3   ferrous sulfate  325 (65 FE) MG tablet Take 1 tablet (325 mg total) by mouth daily after breakfast. 30 tablet 0   furosemide  (LASIX ) 40 MG tablet Take 1 tablet (40 mg total) by mouth as needed. 60 tablet 2   sacubitril -valsartan  (ENTRESTO ) 24-26 MG Take 1 tablet by mouth 2 (two) times daily. 60 tablet 3   spironolactone  (ALDACTONE ) 25 MG tablet Take 1/2 tablets (12.5 mg total) by mouth daily. 45 tablet 3   TYLENOL  500 MG tablet Take 500-1,000 mg by mouth every 6 (six) hours as needed for mild pain or headache.     No current facility-administered medications for this visit.   Allergies[2]   Review of Systems: All systems reviewed and negative except where noted in HPI.     No results found.  Physical Exam: There were no vitals taken for this visit. Constitutional: Pleasant,well-developed, ***female in no acute distress. HEENT: Normocephalic and atraumatic. Conjunctivae are normal. No scleral icterus. Neck supple.  Cardiovascular: Normal rate, regular rhythm.  Pulmonary/chest: Effort normal and breath sounds normal. No wheezing, rales or rhonchi. Abdominal: Soft, nondistended, nontender. Bowel sounds active throughout. There are no masses palpable. No hepatomegaly. Extremities: no edema Lymphadenopathy: No cervical adenopathy noted. Neurological: Alert and oriented to person place and time. Skin: Skin is warm and dry. No rashes noted. Psychiatric: Normal mood and affect. Behavior is normal.   ASSESSMENT: 83 y.o. female here for assessment of the following  No diagnosis found.  PLAN:   Loris Elsie PARAS, PA-C    [1]  Social History Tobacco Use   Smoking status: Former    Current packs/day: 0.50    Average  packs/day: 0.5 packs/day for 20.0 years (10.0 ttl pk-yrs)    Types: Cigarettes   Smokeless tobacco: Never  Vaping Use   Vaping status: Never Used  Substance Use Topics   Alcohol use: No   Drug use: No  [2]  Allergies Allergen Reactions   Vicodin [Hydrocodone-Acetaminophen ] Nausea And Vomiting   "

## 2024-11-11 ENCOUNTER — Ambulatory Visit: Admitting: Gastroenterology
